# Patient Record
Sex: Male | Born: 1940
Health system: Southern US, Community
[De-identification: ages and names within clinical notes are randomized; demographics above are authoritative.]

## PROBLEM LIST (undated history)

## (undated) DIAGNOSIS — N400 Enlarged prostate without lower urinary tract symptoms: Secondary | ICD-10-CM

## (undated) DIAGNOSIS — I340 Nonrheumatic mitral (valve) insufficiency: Secondary | ICD-10-CM

## (undated) DIAGNOSIS — Z8719 Personal history of other diseases of the digestive system: Secondary | ICD-10-CM

## (undated) DIAGNOSIS — I482 Chronic atrial fibrillation, unspecified: Secondary | ICD-10-CM

## (undated) DIAGNOSIS — E7800 Pure hypercholesterolemia, unspecified: Secondary | ICD-10-CM

## (undated) DIAGNOSIS — C439 Malignant melanoma of skin, unspecified: Secondary | ICD-10-CM

## (undated) DIAGNOSIS — E876 Hypokalemia: Secondary | ICD-10-CM

## (undated) DIAGNOSIS — E875 Hyperkalemia: Secondary | ICD-10-CM

## (undated) DIAGNOSIS — E785 Hyperlipidemia, unspecified: Secondary | ICD-10-CM

## (undated) DIAGNOSIS — F329 Major depressive disorder, single episode, unspecified: Secondary | ICD-10-CM

## (undated) DIAGNOSIS — R011 Cardiac murmur, unspecified: Secondary | ICD-10-CM

## (undated) DIAGNOSIS — F32A Depression, unspecified: Secondary | ICD-10-CM

## (undated) DIAGNOSIS — I2510 Atherosclerotic heart disease of native coronary artery without angina pectoris: Secondary | ICD-10-CM

## (undated) DIAGNOSIS — I10 Essential (primary) hypertension: Secondary | ICD-10-CM

## (undated) DIAGNOSIS — I5032 Chronic diastolic (congestive) heart failure: Secondary | ICD-10-CM

## (undated) DIAGNOSIS — F5104 Psychophysiologic insomnia: Secondary | ICD-10-CM

## (undated) DIAGNOSIS — G4730 Sleep apnea, unspecified: Secondary | ICD-10-CM

## (undated) DIAGNOSIS — K5732 Diverticulitis of large intestine without perforation or abscess without bleeding: Secondary | ICD-10-CM

## (undated) DIAGNOSIS — E118 Type 2 diabetes mellitus with unspecified complications: Secondary | ICD-10-CM

## (undated) DIAGNOSIS — I499 Cardiac arrhythmia, unspecified: Secondary | ICD-10-CM

## (undated) DIAGNOSIS — N179 Acute kidney failure, unspecified: Secondary | ICD-10-CM

## (undated) HISTORY — DX: Psychophysiologic insomnia: F51.04

## (undated) HISTORY — DX: Depression, unspecified: F32.A

## (undated) HISTORY — DX: Hyperkalemia: E87.5

## (undated) HISTORY — DX: Hyperlipidemia, unspecified: E78.5

## (undated) HISTORY — PX: MOHS SURGERY: SUR867

## (undated) HISTORY — PX: OTHER SURGICAL HISTORY: SHX169

## (undated) HISTORY — PX: APPENDECTOMY: SHX54

## (undated) HISTORY — DX: Personal history of other diseases of the digestive system: Z87.19

## (undated) HISTORY — DX: Acute kidney failure, unspecified: N17.9

## (undated) HISTORY — DX: Benign prostatic hyperplasia without lower urinary tract symptoms: N40.0

## (undated) HISTORY — PX: COLONOSCOPY: SHX174

## (undated) HISTORY — DX: Major depressive disorder, single episode, unspecified: F32.9

## (undated) HISTORY — DX: Malignant melanoma of skin, unspecified: C43.9

## (undated) HISTORY — DX: Essential (primary) hypertension: I10

---

## 2004-07-05 ENCOUNTER — Ambulatory Visit: Payer: Self-pay | Admitting: Unknown Physician Specialty

## 2007-10-26 ENCOUNTER — Encounter: Payer: Self-pay | Admitting: Internal Medicine

## 2009-10-17 ENCOUNTER — Ambulatory Visit: Payer: Self-pay | Admitting: Unknown Physician Specialty

## 2013-10-02 ENCOUNTER — Inpatient Hospital Stay: Payer: Self-pay | Admitting: Internal Medicine

## 2013-10-02 LAB — BASIC METABOLIC PANEL
Anion Gap: 7 (ref 7–16)
BUN: 15 mg/dL (ref 7–18)
Calcium, Total: 9.1 mg/dL (ref 8.5–10.1)
Chloride: 104 mmol/L (ref 98–107)
Co2: 28 mmol/L (ref 21–32)
Creatinine: 1.02 mg/dL (ref 0.60–1.30)
EGFR (African American): >60
EGFR (Non-African Amer.): >60
Glucose: 147 mg/dL — ABNORMAL HIGH (ref 65–99)
Osmolality: 281 (ref 275–301)
Potassium: 3.9 mmol/L (ref 3.5–5.1)
Sodium: 139 mmol/L (ref 136–145)

## 2013-10-02 LAB — PRO B NATRIURETIC PEPTIDE: B-Type Natriuretic Peptide: 1468 pg/mL — ABNORMAL HIGH (ref 0–125)

## 2013-10-02 LAB — PROTIME-INR
INR: 3.2
Prothrombin Time: 31.9 secs — ABNORMAL HIGH (ref 11.5–14.7)

## 2013-10-02 LAB — CBC
HCT: 44.2 % (ref 40.0–52.0)
HGB: 14.4 g/dL (ref 13.0–18.0)
MCH: 30.1 pg (ref 26.0–34.0)
MCHC: 32.6 g/dL (ref 32.0–36.0)
MCV: 93 fL (ref 80–100)
Platelet: 180 10*3/uL (ref 150–440)
RBC: 4.78 10*6/uL (ref 4.40–5.90)
RDW: 13.8 % (ref 11.5–14.5)
WBC: 8 10*3/uL (ref 3.8–10.6)

## 2013-10-02 LAB — TROPONIN I
Troponin-I: <0.02 ng/mL
Troponin-I: <0.02 ng/mL

## 2013-10-03 LAB — CBC WITH DIFFERENTIAL/PLATELET
Basophil #: 0 10*3/uL (ref 0.0–0.1)
Basophil %: 0.5 %
Eosinophil #: 0.1 10*3/uL (ref 0.0–0.7)
Eosinophil %: 1.7 %
HCT: 42.4 % (ref 40.0–52.0)
HGB: 14.1 g/dL (ref 13.0–18.0)
Lymphocyte #: 1.5 10*3/uL (ref 1.0–3.6)
Lymphocyte %: 22 %
MCH: 30.5 pg (ref 26.0–34.0)
MCHC: 33.2 g/dL (ref 32.0–36.0)
MCV: 92 fL (ref 80–100)
Monocyte #: 0.7 x10 3/mm (ref 0.2–1.0)
Monocyte %: 10.4 %
Neutrophil #: 4.5 10*3/uL (ref 1.4–6.5)
Neutrophil %: 65.4 %
Platelet: 168 10*3/uL (ref 150–440)
RBC: 4.62 10*6/uL (ref 4.40–5.90)
RDW: 13.9 % (ref 11.5–14.5)
WBC: 6.9 10*3/uL (ref 3.8–10.6)

## 2013-10-03 LAB — COMPREHENSIVE METABOLIC PANEL
Albumin: 3.2 g/dL — ABNORMAL LOW (ref 3.4–5.0)
Alkaline Phosphatase: 63 U/L
Anion Gap: 9 (ref 7–16)
BUN: 13 mg/dL (ref 7–18)
Bilirubin,Total: 1.4 mg/dL — ABNORMAL HIGH (ref 0.2–1.0)
Calcium, Total: 8.5 mg/dL (ref 8.5–10.1)
Chloride: 101 mmol/L (ref 98–107)
Co2: 30 mmol/L (ref 21–32)
Creatinine: 1.02 mg/dL (ref 0.60–1.30)
EGFR (African American): >60
EGFR (Non-African Amer.): >60
Glucose: 101 mg/dL — ABNORMAL HIGH (ref 65–99)
Osmolality: 280 (ref 275–301)
Potassium: 3.3 mmol/L — ABNORMAL LOW (ref 3.5–5.1)
SGOT(AST): 44 U/L — ABNORMAL HIGH (ref 15–37)
SGPT (ALT): 62 U/L
Sodium: 140 mmol/L (ref 136–145)
Total Protein: 7 g/dL (ref 6.4–8.2)

## 2013-10-03 LAB — PROTIME-INR
INR: 2.6
Prothrombin Time: 26.9 secs — ABNORMAL HIGH (ref 11.5–14.7)

## 2013-10-03 LAB — TROPONIN I
Troponin-I: <0.02 ng/mL
Troponin-I: <0.02 ng/mL

## 2013-10-03 LAB — LIPID PANEL
Cholesterol: 169 mg/dL (ref 0–200)
HDL Cholesterol: 28 mg/dL — ABNORMAL LOW (ref 40–60)
Ldl Cholesterol, Calc: 104 mg/dL — ABNORMAL HIGH (ref 0–100)
Triglycerides: 187 mg/dL (ref 0–200)
VLDL Cholesterol, Calc: 37 mg/dL (ref 5–40)

## 2013-10-03 LAB — HEMOGLOBIN A1C: Hemoglobin A1C: 7 % — ABNORMAL HIGH (ref 4.2–6.3)

## 2013-10-03 LAB — TSH: Thyroid Stimulating Horm: 1.32 u[IU]/mL

## 2013-10-04 LAB — BASIC METABOLIC PANEL
Anion Gap: 6 — ABNORMAL LOW (ref 7–16)
BUN: 19 mg/dL — ABNORMAL HIGH (ref 7–18)
Calcium, Total: 9.2 mg/dL (ref 8.5–10.1)
Chloride: 100 mmol/L (ref 98–107)
Co2: 30 mmol/L (ref 21–32)
Creatinine: 1.2 mg/dL (ref 0.60–1.30)
EGFR (African American): >60
EGFR (Non-African Amer.): >60
Glucose: 147 mg/dL — ABNORMAL HIGH (ref 65–99)
Osmolality: 277 (ref 275–301)
Potassium: 3.5 mmol/L (ref 3.5–5.1)
Sodium: 136 mmol/L (ref 136–145)

## 2013-10-04 LAB — PROTIME-INR
INR: 2
Prothrombin Time: 22.5 secs — ABNORMAL HIGH (ref 11.5–14.7)

## 2013-10-05 LAB — BASIC METABOLIC PANEL
Anion Gap: 10 (ref 7–16)
BUN: 25 mg/dL — ABNORMAL HIGH (ref 7–18)
Calcium, Total: 8.9 mg/dL (ref 8.5–10.1)
Chloride: 99 mmol/L (ref 98–107)
Co2: 29 mmol/L (ref 21–32)
Creatinine: 1.41 mg/dL — ABNORMAL HIGH (ref 0.60–1.30)
EGFR (African American): 57 — ABNORMAL LOW
EGFR (Non-African Amer.): 49 — ABNORMAL LOW
Glucose: 140 mg/dL — ABNORMAL HIGH (ref 65–99)
Osmolality: 282 (ref 275–301)
Potassium: 3.4 mmol/L — ABNORMAL LOW (ref 3.5–5.1)
Sodium: 138 mmol/L (ref 136–145)

## 2013-10-05 LAB — PROTIME-INR
INR: 2
INR: 2
Prothrombin Time: 22.5 secs — ABNORMAL HIGH (ref 11.5–14.7)
Prothrombin Time: 21.9 secs — ABNORMAL HIGH (ref 11.5–14.7)

## 2013-10-06 DIAGNOSIS — I2510 Atherosclerotic heart disease of native coronary artery without angina pectoris: Secondary | ICD-10-CM

## 2013-10-06 HISTORY — DX: Atherosclerotic heart disease of native coronary artery without angina pectoris: I25.10

## 2013-10-06 HISTORY — PX: CARDIAC CATHETERIZATION: SHX172

## 2013-10-06 LAB — BASIC METABOLIC PANEL WITH GFR
Anion Gap: 6 — ABNORMAL LOW
BUN: 30 mg/dL — ABNORMAL HIGH
Calcium, Total: 8.6 mg/dL
Chloride: 99 mmol/L
Co2: 29 mmol/L
Creatinine: 1.56 mg/dL — ABNORMAL HIGH
EGFR (African American): 51 — ABNORMAL LOW
EGFR (Non-African Amer.): 44 — ABNORMAL LOW
Glucose: 161 mg/dL — ABNORMAL HIGH
Osmolality: 278
Potassium: 3.2 mmol/L — ABNORMAL LOW
Sodium: 134 mmol/L — ABNORMAL LOW

## 2013-10-06 LAB — PROTIME-INR
INR: 1.7
Prothrombin Time: 20 secs — ABNORMAL HIGH (ref 11.5–14.7)

## 2014-01-02 ENCOUNTER — Ambulatory Visit (INDEPENDENT_AMBULATORY_CARE_PROVIDER_SITE_OTHER): Payer: Medicare Other | Admitting: Cardiovascular Disease

## 2014-01-02 ENCOUNTER — Telehealth: Payer: Self-pay

## 2014-01-02 ENCOUNTER — Encounter: Payer: Self-pay | Admitting: Cardiovascular Disease

## 2014-01-02 VITALS — BP 160/90 | HR 72 | Ht 68.5 in | Wt 230.8 lb

## 2014-01-02 DIAGNOSIS — I4891 Unspecified atrial fibrillation: Secondary | ICD-10-CM

## 2014-01-02 DIAGNOSIS — I5033 Acute on chronic diastolic (congestive) heart failure: Secondary | ICD-10-CM | POA: Insufficient documentation

## 2014-01-02 DIAGNOSIS — R0609 Other forms of dyspnea: Secondary | ICD-10-CM | POA: Insufficient documentation

## 2014-01-02 DIAGNOSIS — R600 Localized edema: Secondary | ICD-10-CM

## 2014-01-02 DIAGNOSIS — I482 Chronic atrial fibrillation, unspecified: Secondary | ICD-10-CM | POA: Insufficient documentation

## 2014-01-02 DIAGNOSIS — Z87891 Personal history of nicotine dependence: Secondary | ICD-10-CM | POA: Insufficient documentation

## 2014-01-02 DIAGNOSIS — R0602 Shortness of breath: Secondary | ICD-10-CM | POA: Insufficient documentation

## 2014-01-02 DIAGNOSIS — E118 Type 2 diabetes mellitus with unspecified complications: Secondary | ICD-10-CM | POA: Insufficient documentation

## 2014-01-02 DIAGNOSIS — E785 Hyperlipidemia, unspecified: Secondary | ICD-10-CM | POA: Insufficient documentation

## 2014-01-02 DIAGNOSIS — I2510 Atherosclerotic heart disease of native coronary artery without angina pectoris: Secondary | ICD-10-CM | POA: Insufficient documentation

## 2014-01-02 MED ORDER — FUROSEMIDE 20 MG PO TABS: 20.0000 mg | ORAL_TABLET | Freq: Two times a day (BID) | ORAL | Status: DC | PRN

## 2014-01-02 MED ORDER — POTASSIUM CHLORIDE ER 10 MEQ PO TBCR: 10.0000 meq | EXTENDED_RELEASE_TABLET | Freq: Two times a day (BID) | ORAL | Status: DC | PRN

## 2014-01-02 MED ORDER — RIVAROXABAN 20 MG PO TABS: 20.0000 mg | ORAL_TABLET | Freq: Every day | ORAL | Status: DC

## 2014-01-02 MED ORDER — SIMVASTATIN 40 MG PO TABS: 40.0000 mg | ORAL_TABLET | Freq: Every day | ORAL | Status: DC

## 2014-01-02 NOTE — Patient Instructions (Addendum)
   Please check the price of the xarelto And pradaxa Do not start xarelto until you have called the office   We will schedule a follow up visit with the coumadin clinic   Please start lasix up to two a day  Daily weight: Weight 218 to 220:  Lasix 20 mg daily Weight 221 to 226: lasix 20 mg twice a day Weight 227 to 231: lasix  40 mg in the AM and 20 mg in the PM   Restart simvastatin one a day  For any chest pain or shortness of breath, call the office  Please call us if you have new issues that need to be addressed before your next appt.  Your physician wants you to follow-up in: 1 month.

## 2014-01-02 NOTE — Assessment & Plan Note (Signed)
Heart rate well controlled, tolerating warfarin. INR fluctuating between 2 and 4. He is interested in changing to an alternate medication, NOAC. Prescription provided for pradaxa and xarelto he will check the price of these medications and call us if they are affordable. He would like to establish in the Coumadin clinic in our office. We will arrange this for him

## 2014-01-02 NOTE — Assessment & Plan Note (Signed)
We have provided him a prescription for simvastatin 40 mg daily. Goal LDL less than 70

## 2014-01-02 NOTE — Assessment & Plan Note (Addendum)
His cardiac catheterization films were pulled up and reviewed with him in the office. Coronary stenoses shown to the patient and results reviewed in detail. Recommended he restart his simvastatin 40 mg daily as he was taking previously. Marlowe Kays not on aspirin as he is on warfarin. He is on a beta blocker. Stressed the importance of calling our office if he gets worsening chest pain or shortness of breath

## 2014-01-02 NOTE — Telephone Encounter (Signed)
Spoke w/ Marlowe Kays.  Advised her that Dr. Rockey Situ advised pt to check on the price of Xarelto and Pradaxa and call the office w/ decision on which he would like to switch to.

## 2014-01-02 NOTE — Assessment & Plan Note (Addendum)
Weight is up 10 pounds from his recent hospital discharge August 2015. Dietary discretion, not on Lasix. We have recommended he follow the guidelines below: Check Daily weight: Weight 218 to 220:  Lasix 20 mg daily Weight 221 to 226: lasix 20 mg twice a day Weight 227 to 231: lasix  40 mg in the AM and 20 mg in the PM  He will take 1 potassium 10 mEq 4 every Lasix 20 mg

## 2014-01-02 NOTE — Assessment & Plan Note (Signed)
Long history of smoking may be contributing to his underlying lung disease and shortness of breath. Suspect at least mild COPD.

## 2014-01-02 NOTE — Assessment & Plan Note (Signed)
Shortness of breath likely multifactorial including long history of smoking, COPD, diastolic CHF, coronary artery disease, and atrial fibrillation as well as deconditioning and obesity. Diuretic regimen as above

## 2014-01-02 NOTE — Progress Notes (Addendum)
Patient ID: LABRYANT CHALIFOUX, male    DOB: 1940/03/21, 73 y.o.   MRN: [id number]  HPI Comments:  Mr. Wojtas is a very pleasant 73 year old gentleman with long history of smoking for 30-40 years, some alcohol use, obesity, chronic diastolic CHF, chronic atrial fibrillation, hypertension, type 2 diabetes, presenting for new patient evaluation.  He had recent hospital admission 10/02/2013 with discharge August 12. Hospital records were reviewed He presented with shortness of breath, Atrial fibrillation with RVR. Echocardiogram showed normal ejection fraction. Symptoms improved with diuresis.  Echocardiogram 10/02/2013 showing ejection fraction 55-60%, mildly dilated left atrium, mild to moderate MR and TR  He had cardiac catheterization 10/06/2013 showing occluded diagonal vessel, 40% proximal LAD disease, bifurcating marginal branch/ramus. Ramus had 60% followed by 80% disease, OM with 50% mid disease at least. RCA with 40% mid disease. Medical management recommended  He was discharged on Lasix, resents today with no Lasix on his medication list. He recently went to the beach and weight is up 10 pounds from his recent discharge from the hospital in August. He has abdominal swelling, leg swelling. Most recent hemoglobin A1c 7.1 up from 6.9 Previously was on simvastatin. Currently not on simvastatin for uncertain reasons. On the simvastatin, total cholesterol was 139, LDL 50 He is interested in coming off warfarin and try an alternate medication. xarelto was too expensive in the past. He is requesting that his warfarin be monitored in our clinic.  Prior weight at the time of hospital discharge was 218 pounds, now he reports 228 pounds at home, 230 pounds in our clinic  EKG done in our office today shows atrial fibrillation with right bundle branch block, rate 72 bpm   Outpatient Encounter Prescriptions as of 01/02/2014  Medication Sig  . aspirin 81 MG tablet Take 81 mg by mouth daily.  .  carvedilol (COREG) 25 MG tablet Take 25 mg by mouth 2 (two) times daily with a meal.   . diltiazem (DILACOR XR) 120 MG 24 hr capsule Take 120 mg by mouth daily.   [name] glimepiride (AMARYL) 4 MG tablet Take 4 mg by mouth daily with breakfast.  . hydrALAZINE (APRESOLINE) 25 MG tablet Take 25 mg by mouth 3 (three) times daily.   [name] lisinopril-hydrochlorothiazide (PRINZIDE,ZESTORETIC) 20-12.5 MG per tablet Take 1 tablet by mouth daily.   . magnesium oxide (MAG-OX) 400 MG tablet Take 400 mg by mouth daily.  . metformin (FORTAMET) 1000 MG (OSM) 24 hr tablet Take 1,000 mg by mouth 2 (two) times daily with a meal.   . nitroGLYCERIN (NITROSTAT) 0.4 MG SL tablet Place 0.4 mg under the tongue every 5 (five) minutes as needed for chest pain.  [name] OMEGA-3 FATTY ACIDS PO Take 1 tablet by mouth twice a day.  . tamsulosin (FLOMAX) 0.4 MG CAPS capsule Take 0.4 mg by mouth daily.   [name] warfarin (COUMADIN) 1 MG tablet Take 1 mg by mouth one time only at 6 PM.   . warfarin (COUMADIN) 4 MG tablet Take 4 mg by mouth one time only at 6 PM.   . zolpidem (AMBIEN) 10 MG tablet Take 5 mg by mouth at bedtime as needed for sleep.  [name] QUEtiapine (SEROQUEL) 100 MG tablet Take 100 mg by mouth at bedtime.   In terms of his social history He drinks alcohol periodically, sometimes in volume. Stop smoking 20 years ago, smoked for 30-40 years. No chewing tobacco  In terms of his family history Positive for coronary artery disease, parents  Past Medical History  Diagnosis Date  . CHF (congestive heart failure)   . A-fib   . Acute renal failure   . Systolic CHF   . Hyperkalemia   . Hyperlipidemia   . Diabetes mellitus without complication   . Coronary artery disease 10/06/2013    [facility]  . Hypertension   . BPH (benign prostatic hyperplasia)   . History of diverticulitis   . Systolic murmur   . Depression   . Malignant melanoma   . Chronic insomnia     Review of Systems  Constitutional: Positive for unexpected weight change.   Respiratory: Positive for shortness of breath.   Cardiovascular: Positive for leg swelling.  Gastrointestinal: Negative.   Neurological: Negative.   Psychiatric/Behavioral: Negative.   All other systems reviewed and are negative.   BP 160/90 mmHg  Pulse 72  Ht 5' 8.5" (1.74 m)  Wt 230 lb 12 oz (104.668 kg)  BMI 34.57 kg/m2  Physical Exam  Constitutional: He is oriented to person, place, and time. He appears well-developed and well-nourished.  obese  HENT:  Head: Normocephalic.  Nose: Nose normal.  Mouth/Throat: Oropharynx is clear and moist.  Eyes: Conjunctivae are normal. Pupils are equal, round, and reactive to light.  Neck: Normal range of motion. Neck supple. No JVD present.  Cardiovascular: Normal rate, S1 normal, S2 normal, normal heart sounds and intact distal pulses.  An irregularly irregular rhythm present. Exam reveals no gallop and no friction rub.   No murmur heard. 1 plus pitting edema   Pulmonary/Chest: Effort normal and breath sounds normal. No respiratory distress. He has no wheezes. He has no rales. He exhibits no tenderness.  Unable to exclude dose at the left base  Abdominal: Soft. Bowel sounds are normal. He exhibits distension. There is no tenderness.  Musculoskeletal: Normal range of motion. He exhibits edema. He exhibits no tenderness.  Lymphadenopathy:    He has no cervical adenopathy.  Neurological: He is alert and oriented to person, place, and time. Coordination normal.  Skin: Skin is warm and dry. No rash noted. No erythema.  Psychiatric: He has a normal mood and affect. His behavior is normal. Judgment and thought content normal.      Assessment and Plan   Nursing note and vitals reviewed.

## 2014-01-02 NOTE — Telephone Encounter (Signed)
Pharmacist from [facility], states they have a rx for Warfarin on file, and we prescribe Xarelto. Please call.

## 2014-01-02 NOTE — Assessment & Plan Note (Signed)
We have encouraged continued exercise, careful diet management in an effort to lose weight. 

## 2014-01-02 NOTE — Assessment & Plan Note (Signed)
Etiology of his leg edema concerning for worsening/acute on chronic diastolic CHF. We will start Lasix titration as above. Dietary guide given to him. Recommended low salt diet, decreasing his fluid intake

## 2014-01-03 ENCOUNTER — Telehealth: Payer: Self-pay

## 2014-01-03 NOTE — Telephone Encounter (Signed)
Pt wife called letting us know that they got Pradaxa filled, pt needs instructions on stopping warfarin. Please call and advise

## 2014-01-03 NOTE — Telephone Encounter (Signed)
Would stop the warfarin for 3 days Then start pradaxa one pill twice a day

## 2014-01-04 NOTE — Telephone Encounter (Signed)
Spoke w/ pt's wife.  Advised her of Dr. Donivan Scull recommendation.  She verbalizes understanding and is agreeable.  She reports that pt's wt is down to 216 lbs and he is feeling much better.  Asked her to call back w/ any questions or concerns.

## 2014-01-05 ENCOUNTER — Telehealth: Payer: Self-pay | Admitting: Cardiovascular Disease

## 2014-01-05 MED ORDER — NITROGLYCERIN 0.4 MG SL SUBL: 0.4000 mg | SUBLINGUAL_TABLET | SUBLINGUAL | Status: DC | PRN

## 2014-01-05 NOTE — Telephone Encounter (Signed)
Spoke w/ pt's wife.  She reports that pt needs refill on Nitro and that pt has not taken Seroquel since d/c from hospital.  Refill sent and med list updated.

## 2014-01-05 NOTE — Telephone Encounter (Signed)
New problem   Pt has question concerning medications that the pt doesn't have any orders for. Pt's wife will be at home for next hour and after that please call her cell at [phone].

## 2014-02-01 ENCOUNTER — Encounter: Payer: Self-pay | Admitting: Cardiovascular Disease

## 2014-02-01 ENCOUNTER — Ambulatory Visit (INDEPENDENT_AMBULATORY_CARE_PROVIDER_SITE_OTHER): Payer: Medicare Other

## 2014-02-01 ENCOUNTER — Ambulatory Visit (INDEPENDENT_AMBULATORY_CARE_PROVIDER_SITE_OTHER): Payer: Medicare Other | Admitting: Cardiovascular Disease

## 2014-02-01 ENCOUNTER — Encounter (INDEPENDENT_AMBULATORY_CARE_PROVIDER_SITE_OTHER): Payer: Self-pay

## 2014-02-01 VITALS — BP 136/78 | HR 101 | Ht 69.0 in | Wt 228.3 lb

## 2014-02-01 DIAGNOSIS — I4891 Unspecified atrial fibrillation: Secondary | ICD-10-CM

## 2014-02-01 DIAGNOSIS — I5033 Acute on chronic diastolic (congestive) heart failure: Secondary | ICD-10-CM

## 2014-02-01 DIAGNOSIS — R0602 Shortness of breath: Secondary | ICD-10-CM

## 2014-02-01 DIAGNOSIS — I2510 Atherosclerotic heart disease of native coronary artery without angina pectoris: Secondary | ICD-10-CM

## 2014-02-01 DIAGNOSIS — Z5181 Encounter for therapeutic drug level monitoring: Secondary | ICD-10-CM

## 2014-02-01 DIAGNOSIS — E118 Type 2 diabetes mellitus with unspecified complications: Secondary | ICD-10-CM

## 2014-02-01 DIAGNOSIS — R600 Localized edema: Secondary | ICD-10-CM

## 2014-02-01 NOTE — Assessment & Plan Note (Signed)
Currently with no symptoms of angina. No further workup at this time. Continue current medication regimen. 

## 2014-02-01 NOTE — Assessment & Plan Note (Signed)
We have encouraged continued exercise, careful diet management in an effort to lose weight. 

## 2014-02-01 NOTE — Progress Notes (Signed)
Pt was started on Pradaxa 150mg  BID by Dr Rockey Situ for afib on 01/04/14.    Reviewed patients medication list.  Pt is not currently on any combined P-gp and strong CYP3A4 inhibitors/inducers (ketoconazole, traconazole, ritonavir, carbamazepine, phenytoin, rifampin, [place]. Wylan's wort).  Reviewed labs.  SCr 1.1, Weight 103.5kg, CrCl-88.43.  Dose appropriate based on CrCl.   Hgb and HCT slightly decreased from labwork in August 2015. Discussed with Elberta Leatherwood, pharmD will recheck CBC in 1 month for stability.  Hgb 14.1 on 10/02/13 at [facility], Hgb 12.5 at office on 02/01/14.   Called spoke with pt's wife advised of lab results and need to repeat CBC in 1 month for stability.  Made pt appt on 02/28/13 at [facility] for CBC in [place] office.

## 2014-02-01 NOTE — Assessment & Plan Note (Signed)
Recommended diet changes, weight loss

## 2014-02-01 NOTE — Patient Instructions (Signed)
A full discussion of the nature of anticoagulants has been carried out.  A benefit/risk analysis has been presented to the patient, so that they understand the justification for choosing anticoagulation with Praxada at this time.  The need for compliance is stressed.  Pt is aware to take the medication twice daily.  Side effects of potential bleeding are discussed, including unusual colored urine or stools, coughing up blood or coffee ground emesis, nose bleeds or serious fall or head trauma.  Discussed signs and symptoms of stroke. The patient should avoid any OTC items containing aspirin or ibuprofen.  Avoid alcohol consumption.   Call if any signs of abnormal bleeding.  Discussed financial obligations and resolved any difficulty in obtaining medication.  Asked pt to call MD if any medications change or he is scheduled for a procedure. Next lab test test in 6 months.

## 2014-02-01 NOTE — Patient Instructions (Signed)
You are doing well. No medication changes were made.  Check Daily weight: Weight less than 218, no lasix Weight 218 to 220: Lasix 20 mg daily Weight 221 to 226: lasix 20 mg twice a day Weight 227 to 231: lasix 40 mg in the AM and 20 mg in the PM  He will take 1 potassium 10 mEq 4 every Lasix 20 mg  Please call us if you have new issues that need to be addressed before your next appt.  Your physician wants you to follow-up in: 6 months.  You will receive a reminder letter in the mail two months in advance. If you don't receive a letter, please call our office to schedule the follow-up appointment.

## 2014-02-01 NOTE — Assessment & Plan Note (Signed)
Leg edema has significantly improved on his current diuretic regimen

## 2014-02-01 NOTE — Progress Notes (Signed)
Patient ID: Todd Valentine, male    DOB: Feb 15, 1941, 73 y.o.   MRN: [id number]  HPI Comments:  Todd Valentine is a very pleasant 73 year old gentleman with long history of smoking for 30-40 years, some alcohol use, obesity, chronic diastolic CHF, chronic atrial fibrillation, hypertension, type 2 diabetes, presenting for follow-up of his atrial fibrillation  In follow-up today, he reports that his weight is down to 218 pounds on Lasix 20 mg twice a day. Legs are less swollen, less shortness of breath, less abdominal swelling Reports his heart rate is typically well controlled. He uses a wrist monitor  He changed from warfarin to pradaxa and prefers this regimen better Overall he has no complaints.  EKG on today's visit shows atrial fibrillation with rate 101 bpm, PVCs noted  Other past medical history  hospital admission 10/02/2013 with discharge August 12. He presented with shortness of breath, Atrial fibrillation with RVR. Echocardiogram showed normal ejection fraction. Symptoms improved with diuresis.  Echocardiogram 10/02/2013 showing ejection fraction 55-60%, mildly dilated left atrium, mild to moderate MR and TR  He had cardiac catheterization 10/06/2013 showing occluded diagonal vessel, 40% proximal LAD disease, bifurcating marginal branch/ramus. Ramus had 60% followed by 80% disease, OM with 50% mid disease at least. RCA with 40% mid disease. Medical management recommended  Most recent hemoglobin A1c 7.1 up from 6.9 Previously was on simvastatin. Currently not on simvastatin for uncertain reasons. On the simvastatin, total cholesterol was 139, LDL 50  Prior weight at the time of hospital discharge was 218 pounds, now he reports 228 pounds at home, 230 pounds in our clinic   Outpatient Encounter Prescriptions as of 02/01/2014  Medication Sig  . aspirin 81 MG tablet Take 81 mg by mouth daily.  . carvedilol (COREG) 25 MG tablet Take 25 mg by mouth 2 (two) times daily with a meal.    . dabigatran (PRADAXA) 150 MG CAPS capsule Take 150 mg by mouth 2 (two) times daily.  [name] diltiazem (DILACOR XR) 120 MG 24 hr capsule Take 120 mg by mouth daily.   . furosemide (LASIX) 20 MG tablet Take 1 tablet (20 mg total) by mouth 2 (two) times daily as needed.  [name] glimepiride (AMARYL) 4 MG tablet Take 4 mg by mouth daily with breakfast.  . hydrALAZINE (APRESOLINE) 25 MG tablet Take 25 mg by mouth 3 (three) times daily.   [name] HYDROcodone-acetaminophen (NORCO/VICODIN) 5-325 MG per tablet Take 1 tablet by mouth every 6 (six) hours as needed.   [name] lisinopril-hydrochlorothiazide (PRINZIDE,ZESTORETIC) 20-12.5 MG per tablet Take 1 tablet by mouth daily.   . magnesium oxide (MAG-OX) 400 MG tablet Take 400 mg by mouth daily.  . metformin (FORTAMET) 1000 MG (OSM) 24 hr tablet Take 1,000 mg by mouth 2 (two) times daily with a meal.   . nitroGLYCERIN (NITROSTAT) 0.4 MG SL tablet Place 1 tablet (0.4 mg total) under the tongue every 5 (five) minutes as needed for chest pain.  [name] OMEGA-3 FATTY ACIDS PO Take 1 tablet by mouth twice a day.  . potassium chloride (K-DUR) 10 MEQ tablet Take 1 tablet (10 mEq total) by mouth 2 (two) times daily as needed.  . simvastatin (ZOCOR) 40 MG tablet Take 1 tablet (40 mg total) by mouth at bedtime.  . tamsulosin (FLOMAX) 0.4 MG CAPS capsule Take 0.4 mg by mouth daily.   [name] zolpidem (AMBIEN) 10 MG tablet Take 5 mg by mouth at bedtime as needed for sleep.    In terms of his social history  He drinks alcohol periodically, sometimes in volume. Stop smoking 20 years ago, smoked for 30-40 years. No chewing tobacco  In terms of his family history Positive for coronary artery disease, parents  Past Medical History  Diagnosis Date  . CHF (congestive heart failure)   . A-fib   . Acute renal failure   . Systolic CHF   . Hyperkalemia   . Hyperlipidemia   . Diabetes mellitus without complication   . Coronary artery disease 10/06/2013    [facility]  . Hypertension   . BPH (benign  prostatic hyperplasia)   . History of diverticulitis   . Systolic murmur   . Depression   . Malignant melanoma   . Chronic insomnia     Review of Systems  Constitutional: Negative.   Respiratory: Negative.   Cardiovascular: Negative.   Gastrointestinal: Negative.   Neurological: Negative.   Psychiatric/Behavioral: Negative.   All other systems reviewed and are negative.   BP 136/78 mmHg  Pulse 101  Ht 5\' 9"  (1.753 m)  Wt 228 lb 5 oz (103.562 kg)  BMI 33.70 kg/m2  Physical Exam  Constitutional: He is oriented to person, place, and time. He appears well-developed and well-nourished.  obese  HENT:  Head: Normocephalic.  Nose: Nose normal.  Mouth/Throat: Oropharynx is clear and moist.  Eyes: Conjunctivae are normal. Pupils are equal, round, and reactive to light.  Neck: Normal range of motion. Neck supple. No JVD present.  Cardiovascular: Normal rate, S1 normal, S2 normal, normal heart sounds and intact distal pulses.  An irregularly irregular rhythm present. Exam reveals no gallop and no friction rub.   No murmur heard. 1 plus pitting edema   Pulmonary/Chest: Effort normal and breath sounds normal. No respiratory distress. He has no wheezes. He has no rales. He exhibits no tenderness.  Unable to exclude dose at the left base  Abdominal: Soft. Bowel sounds are normal. He exhibits distension. There is no tenderness.  Musculoskeletal: Normal range of motion. He exhibits edema. He exhibits no tenderness.  Lymphadenopathy:    He has no cervical adenopathy.  Neurological: He is alert and oriented to person, place, and time. Coordination normal.  Skin: Skin is warm and dry. No rash noted. No erythema.  Psychiatric: He has a normal mood and affect. His behavior is normal. Judgment and thought content normal.      Assessment and Plan   Nursing note and vitals reviewed.

## 2014-02-01 NOTE — Assessment & Plan Note (Signed)
Heart rate relatively well controlled, he is on anticoagulation and tolerating this well. We did discuss options for his atrial fibrillation. He is inclined to stay on his current course. Recommended he call us if he has worsening symptoms of shortness of breath.

## 2014-02-01 NOTE — Assessment & Plan Note (Addendum)
Shortness of breath has improved with aggressive diuresis. He we'll follow the diuretic titration scale above. Lab work has been ordered today including BMP and CBC

## 2014-02-01 NOTE — Assessment & Plan Note (Addendum)
Weight significantly improved. We will continue him on his current Lasix titration scale as detailed on his previous office note. Recommended he Check Daily weight: Weight less than 218, no lasix Weight 218 to 220: Lasix 20 mg daily Weight 221 to 226: lasix 20 mg twice a day Weight 227 to 231: lasix 40 mg in the AM and 20 mg in the PM  He will take 1 potassium 10 mEq 4 every Lasix 20 mg

## 2014-02-02 LAB — BASIC METABOLIC PANEL
BUN/Creatinine Ratio: 19 (ref 10–22)
BUN: 21 mg/dL (ref 8–27)
CO2: 15 mmol/L — ABNORMAL LOW (ref 18–29)
Calcium: 9.2 mg/dL (ref 8.6–10.2)
Chloride: 103 mmol/L (ref 97–108)
Creatinine, Ser: 1.1 mg/dL (ref 0.76–1.27)
GFR calc Af Amer: 77 mL/min/{1.73_m2} (ref >59)
GFR calc non Af Amer: 67 mL/min/{1.73_m2} (ref >59)
Glucose: 171 mg/dL — ABNORMAL HIGH (ref 65–99)
Potassium: 4.4 mmol/L (ref 3.5–5.2)
Sodium: 139 mmol/L (ref 134–144)

## 2014-02-02 LAB — CBC WITH DIFFERENTIAL
Basophils Absolute: 0 10*3/uL (ref 0.0–0.2)
Basos: 1 %
Eos: 4 %
Eosinophils Absolute: 0.2 10*3/uL (ref 0.0–0.4)
HCT: 38.2 % (ref 37.5–51.0)
Hemoglobin: 12.5 g/dL — ABNORMAL LOW (ref 12.6–17.7)
Immature Grans (Abs): 0 10*3/uL (ref 0.0–0.1)
Immature Granulocytes: 0 %
Lymphocytes Absolute: 1.1 10*3/uL (ref 0.7–3.1)
Lymphs: 23 %
MCH: 29.8 pg (ref 26.6–33.0)
MCHC: 32.7 g/dL (ref 31.5–35.7)
MCV: 91 fL (ref 79–97)
Monocytes Absolute: 0.5 10*3/uL (ref 0.1–0.9)
Monocytes: 11 %
Neutrophils Absolute: 2.8 10*3/uL (ref 1.4–7.0)
Neutrophils Relative %: 61 %
Platelets: 162 10*3/uL (ref 150–379)
RBC: 4.2 x10E6/uL (ref 4.14–5.80)
RDW: 14.6 % (ref 12.3–15.4)
WBC: 4.5 10*3/uL (ref 3.4–10.8)

## 2014-02-07 ENCOUNTER — Encounter: Payer: Self-pay | Admitting: Cardiovascular Disease

## 2014-02-09 ENCOUNTER — Telehealth: Payer: Self-pay

## 2014-02-09 NOTE — Telephone Encounter (Signed)
Pt wife called, states she needs someone to go over pt medication list with her. She specifically has a question regarding his Metformin.

## 2014-02-09 NOTE — Telephone Encounter (Signed)
Spoke w/ pt's wife.  She states that she misplaced AVS from last visit. Reviewed instructions w/ her and advised her that metformin dose was not changed.  Mailed copy of AVS to pt.

## 2014-02-27 ENCOUNTER — Other Ambulatory Visit: Payer: Self-pay

## 2014-02-27 ENCOUNTER — Telehealth: Payer: Self-pay | Admitting: *Deleted

## 2014-02-27 DIAGNOSIS — E785 Hyperlipidemia, unspecified: Secondary | ICD-10-CM

## 2014-02-27 NOTE — Telephone Encounter (Signed)
Patient's wife stated Dr. Rockey Situ wanted her to call the office the day before lab appt. If the patient wanted to have fasting labs drawn at the same time.

## 2014-02-28 ENCOUNTER — Other Ambulatory Visit (INDEPENDENT_AMBULATORY_CARE_PROVIDER_SITE_OTHER): Payer: Medicare Other

## 2014-02-28 DIAGNOSIS — Z5181 Encounter for therapeutic drug level monitoring: Secondary | ICD-10-CM

## 2014-02-28 DIAGNOSIS — E785 Hyperlipidemia, unspecified: Secondary | ICD-10-CM

## 2014-02-28 DIAGNOSIS — I4891 Unspecified atrial fibrillation: Secondary | ICD-10-CM

## 2014-02-28 DIAGNOSIS — E119 Type 2 diabetes mellitus without complications: Secondary | ICD-10-CM

## 2014-03-01 LAB — LIPID PANEL
Chol/HDL Ratio: 2.7 ratio units (ref 0.0–5.0)
Cholesterol, Total: 119 mg/dL (ref 100–199)
HDL: 44 mg/dL (ref >39)
LDL Calculated: 55 mg/dL (ref 0–99)
Triglycerides: 99 mg/dL (ref 0–149)
VLDL Cholesterol Cal: 20 mg/dL (ref 5–40)

## 2014-03-01 LAB — HEMOGLOBIN A1C
Est. average glucose Bld gHb Est-mCnc: 157 mg/dL
Hgb A1c MFr Bld: 7.1 % — ABNORMAL HIGH (ref 4.8–5.6)

## 2014-03-01 LAB — CBC
HCT: 39.5 % (ref 37.5–51.0)
Hemoglobin: 13.5 g/dL (ref 12.6–17.7)
MCH: 30.1 pg (ref 26.6–33.0)
MCHC: 34.2 g/dL (ref 31.5–35.7)
MCV: 88 fL (ref 79–97)
Platelets: 141 10*3/uL — ABNORMAL LOW (ref 150–379)
RBC: 4.49 x10E6/uL (ref 4.14–5.80)
RDW: 14.4 % (ref 12.3–15.4)
WBC: 5.3 10*3/uL (ref 3.4–10.8)

## 2014-03-07 ENCOUNTER — Telehealth: Payer: Self-pay | Admitting: Cardiovascular Disease

## 2014-03-07 ENCOUNTER — Other Ambulatory Visit: Payer: Self-pay | Admitting: *Deleted

## 2014-03-07 MED ORDER — DILTIAZEM HCL ER 120 MG PO CP24: 120.0000 mg | ORAL_CAPSULE | Freq: Every day | ORAL | Status: DC

## 2014-03-07 NOTE — Telephone Encounter (Signed)
Rx sent for Diltiazem to [facility]90 R#3

## 2014-03-07 NOTE — Telephone Encounter (Signed)
°  1. Which medications need to be refilled? diltiaz er 120-124 cap   2. Which pharmacy is medication to be sent to? [facility] in [place]   3. Do they need a 30 day or 90 day supply? 30 day, but if they can 90 day  4. Would they like a call back once the medication has been sent to the pharmacy? yes

## 2014-03-09 ENCOUNTER — Telehealth: Payer: Self-pay | Admitting: Cardiovascular Disease

## 2014-03-09 NOTE — Telephone Encounter (Signed)
Spoke w/ pt's wife.  She asks that I fax pt's recent labs to PCP. Faxed to Dr. Devona Konig attn @ [facility] in [place] at [phone]. Asked her to call back if we can be of further assistance.

## 2014-03-09 NOTE — Telephone Encounter (Signed)
Pt wife calling stating pt did blood work on the 9th, and he went to PCP and they would like [place] to fax over blood work to them so they do not need to re draw blood    [facility] Dr. Kym Groom needs it.

## 2014-03-28 ENCOUNTER — Telehealth: Payer: Self-pay | Admitting: Cardiovascular Disease

## 2014-03-28 MED ORDER — DILTIAZEM HCL ER 120 MG PO CP24: 120.0000 mg | ORAL_CAPSULE | Freq: Every day | ORAL | Status: DC

## 2014-03-28 NOTE — Telephone Encounter (Signed)
Pt wife called asking about refill.   1. Which medications need to be refilled? diltiazer   2. Which pharmacy is medication to be sent to? [facility] in [place]  3. Do they need a 30 day or 90 day supply? 90 day please  4. Would they like a call back once the medication has been sent to the pharmacy? Yes.

## 2014-03-28 NOTE — Telephone Encounter (Signed)
Refill sent for diltiazem

## 2014-04-24 ENCOUNTER — Other Ambulatory Visit: Payer: Self-pay

## 2014-04-24 MED ORDER — LISINOPRIL-HYDROCHLOROTHIAZIDE 20-12.5 MG PO TABS: 1.0000 | ORAL_TABLET | Freq: Every day | ORAL | Status: DC

## 2014-04-24 NOTE — Telephone Encounter (Signed)
Pt wife states pt  This encounter was created in error - please disregard.

## 2014-05-04 ENCOUNTER — Other Ambulatory Visit: Payer: Self-pay

## 2014-05-04 MED ORDER — CARVEDILOL 25 MG PO TABS: 25.0000 mg | ORAL_TABLET | Freq: Two times a day (BID) | ORAL | Status: DC

## 2014-06-17 NOTE — H&P (Signed)
PATIENT NAME:  Todd Valentine, Todd Valentine MR#:  [id number] DATE OF BIRTH:  1940/09/14  DATE OF ADMISSION:  10/02/2013  PRIMARY CARE PHYSICIAN: Dr. Johny Drilling  HISTORY OF PRESENT ILLNESS: This is a 74 year old Caucasian male with a past medical history of hypertension, atrial fibrillation, and diabetes, who presents with progressive dyspnea on exertion and orthopnea. He states that this has been going on for the last couple of months and getting progressively worse. Initially, he began noticing dyspnea with activities such as golfing. This progressed to include dyspnea on exertion just walking short distances such as from his deck to a building behind his house. He also began to have progressive orthopnea. He would lie down flat. He felt that he could not breathe. He began sitting up in his chair to sleep at night. At that point, he decided he needed to come in to the Emergency Department. In the ED, he was found to be in hypertensive emergency with blood pressures in the 210s over 120s. The initial report, even after IV labetalol, his blood pressure only came down to the 190s over 110s. He is on a number of antihypertensives and reports compliance with these medications. For his atrial fibrillation, he is seen by his cardiologist, Dr. Nehemiah Massed. Of note, he is also on Coumadin due to his atrial fibrillation for stroke prophylaxis. His INR in the ED was slightly supratherapeutic at 3.2. At this point, the hospitalist service was consulted for evaluation for admission.  PAST MEDICAL HISTORY: Type 2 diabetes, hypertension, and atrial fibrillation.  HOME MEDICATIONS: Ambien 10 mg 1 tablet daily, sertraline 15 mg 1 tablet daily, propafenone 225 mg 3 times daily, hydrochlorothiazide/lisinopril combination 12.5 mg/20 mg 1 tablet once a day, Glimepiride 4 mg 1 tablet daily, Lasix 20 mg 1 tablet daily, diltiazem 120 mg once daily, clonidine 0.1 mg orally two times a day, carvedilol 25 mg 1 tablet twice daily.  ALLERGIES:  CIPRO.  SOCIAL HISTORY: He is a former smoker. He lives at home with his wife.   REVIEW OF SYSTEMS: CONSTITUTIONAL: Denies fever. Denies chills. Denies malaise. Endorses fatigue. EYES: Denies double vision. Denies blurred vision. EARS: Denies tinnitus. Denies ear pain. THROAT: Denies sore throat. Denies postnasal drip. CARDIOVASCULAR: Denies palpitations. Denies chest pain. Endorses dyspnea on exertion. Endorses orthopnea. Endorses lower extremity edema. RESPIRATORY: Endorses dyspnea on exertion as above. GASTROINTESTINAL: Denies nausea. Denies vomiting. Denies diarrhea. Denies abdominal pain. GENITOURINARY: Denies dysuria. Denies frequency. Denies hematuria. ENDOCRINE: Denies polyuria. Denies heat or cold intolerance. SKIN: Denies rash. NEUROLOGIC: Denies focal weakness. Denies numbness.  PHYSICAL EXAMINATION: VITAL SIGNS: Temperature 98.2, pulse 102, respirations 23, blood pressure 191/105, pulse ox 95% on room air. GENERAL: The patient is sitting, supine, in bed, in no apparent distress. HEENT: Normocephalic, atraumatic. Pupils are equal, round, and reactive to light. Extraocular movements intact. Sclerae are anicteric. NECK: Supple. No cervical lymphadenopathy. CARDIOVASCULAR: Tachycardic. Irregular rhythm. S1 and S2 present. No murmurs, rubs, or gallops. Has 2+ peripheral edema. RESPIRATORY: Bibasilar crackles. Good air movement. Good inspiratory effort. No use of accessory muscles.  GASTROINTESTINAL: Abdomen soft, nontender, nondistended. Positive bowel sounds. No organomegaly. GENITOURINARY: Normal development. No abnormalities noted. MUSCULOSKELETAL: Full range of motion throughout. Moves all extremities spontaneously. No focal swelling. SKIN: Warm, dry, and intact. No rashes or lesions noted on exam. NEUROLOGIC: Cranial nerves II through XII are grossly intact. No acute focal deficits. PSYCHIATRIC: Appropriate mood and affect.  LABORATORY DATA: White count 8, hemoglobin 14.4,  hematocrit 44.2, platelets 180. Sodium 139, potassium 3.9, chloride 104, bicarbonate  28, creatinine 1.02, BUN 15, glucose 147, calcium 9.1. BNP 1468. Troponin less than 0.02. Chest x-ray was performed, showed small right pleural effusion with right basilar atelectasis. EKG performed showed atrial fibrillation.  ASSESSMENT AND PLAN: This is a 74 year old Caucasian male with past medical history as per history of present illness, who presented in hypertensive emergency and atrial fibrillation, with subsequent acute congestive heart failure.  1.  Hypertensive emergency. The patient's blood pressure was in the 210s over 120s when he arrived in the Emergency Department, coming down only to the 190s over 110s after IV labetalol. He has a history of hypertension, and is compliant on his medications at home. However, his blood pressure was still very elevated on admission today. He will be admitted to the stepdown unit and will continue to get IV labetalol to control his blood pressures. We will switch his home metoprolol to Coreg starting at 6.25 mg b.i.d. If this is not enough, then he may need to be placed on drip medication such as nicardipine.  2.  Atrial fibrillation. He is followed by Dr. Nehemiah Massed, who is his cardiologist. We will consult Dr. Nehemiah Massed. He seems to be in persistent atrial fibrillation despite being on propafenone, though he does state that he missed the dose of that medication this morning. We will continue his propafenone here and also continue his beta blocker, though we are switching his metoprolol to Coreg, as above. Continue his Coumadin. 3.  Acute congestive heart failure. This is likely secondary to his hypertensive emergency and atrial fibrillation. We will control his blood pressure as stated above, consult cardiology as above. Cardiac enzymes are negative so far. We will trend those. We will also get an echocardiogram. 4.  Supratherapeutic INR, measured at 3.2 in the Emergency  Department. This is barely high. We will hold his Coumadin dose today and continue regular Coumadin dosing tomorrow. We will follow his INR while he is an inpatient. 5.  Diabetes mellitus. He reports good control with metformin orally and glipizide. We will check his hemoglobin A1c here. We will continue his metformin. We will hold his glipizide while inpatient, and use sliding scale insulin.  CODE STATUS: Full code.  TIME SPENT: 50 minutes.     ____________________________ Wilford Corner. Jannifer Franklin, MD dfw:cg D: 10/07/2013 22:23:49 ET T: 10/08/2013 01:56:15 ET JOB#: [id number]  cc: Wilford Corner. Jannifer Franklin, MD, <Dictator> Zelie Asbill Fawn Kirk MD ELECTRONICALLY SIGNED 11/01/2013 9:44

## 2014-06-17 NOTE — H&P (Signed)
PATIENT NAME:  Todd Valentine, Todd Valentine MR#:  [id number] DATE OF BIRTH:  1940/07/30  DATE OF ADMISSION:  10/02/2013  PRIMARY CARE PHYSICIAN: Dr. Johny Drilling  HISTORY OF PRESENT ILLNESS: This is a 74 year old Caucasian male with a past medical history of hypertension, atrial fibrillation, and diabetes, who presented with progressive dyspnea on exertion and orthopnea, was found to be in hypertensive emergency with subsequent acute heart failure. The patient states that, for the past couple of months, he has been feeling more fatigued. He has been having more dyspnea on exertion, progressively worsening. At first, he found that it was more difficult for him to engage in activities such as golf, then it became more difficult for him to walk distances such as from his deck to his building behind his house. He also noted that he began to have more orthopnea. He began to feel short of breath as he lying flat or on one pillow. He started falling asleep sitting up in his chair in order to be comfortable and to be able to sleep at night. These last changes were over the last couple of weeks, until he reached the point where he felt like he needed to come in. In the Emergency Room, he was found to be in hypertensive emergency with his blood pressures in the 210s over 120s range. Per the ED initial report, after he was given a dose of IV labetalol, his pressures only came down to the 190s over 110s. He is on prescription antihypertensives and reports compliance with these medications. For his atrial fibrillation, he is seen by his cardiologist, Dr. Nehemiah Massed. Of note, he is also on Coumadin for stroke prophylaxis secondary to his atrial fibrillation. He was found in the ED to have an INR of 3.2, slightly supratherapeutic. At this point, the hospitalist service was asked to evaluate him for admission.  PAST MEDICAL HISTORY: Type 2 diabetes, hypertension, atrial fibrillation.  HOME MEDICATIONS: Ambien 10 mg 1 tablet daily,  sertraline 50 mg 1 tablet daily, propafenone 225 mg 1 tablet 3 times a day, hydrochlorothiazide   DICTATION ENDS HERE   Dictation ended early, see complete H&P for same service date     ____________________________ Wilford Corner. Jannifer Franklin, MD dfw:cg D: 10/07/2013 22:07:16 ET T: 10/08/2013 01:00:19 ET JOB#: [id number]  cc: Wilford Corner. Jannifer Franklin, MD, <Dictator> Kensli Bowley Fawn Kirk MD ELECTRONICALLY SIGNED 11/01/2013 9:45

## 2014-06-17 NOTE — Discharge Summary (Signed)
PATIENT NAME:  Todd Valentine, Todd Valentine MR#:  [id number] DATE OF BIRTH:  08/26/1940  DATE OF ADMISSION:  10/02/2013 DATE OF DISCHARGE:  10/06/2013  ADDENDUM  The patient will be discharged 10/06/2013 after his cardiac catheterization.   ____________________________ Donell Beers. Todd Karvonen, MD spm:DT D: 10/06/2013 12:48:00 ET T: 10/06/2013 14:09:13 ET JOB#: [id number]  cc: Romyn Boswell P. Todd Karvonen, MD, <Dictator> Donell Beers Laron Boorman MD ELECTRONICALLY SIGNED 10/07/2013 14:03

## 2014-06-17 NOTE — Consult Note (Signed)
PATIENT NAME:  Todd Valentine, Todd Valentine MR#:  [id number] DATE OF BIRTH:  October 06, 1940  DATE OF CONSULTATION:  10/03/2013  REFERRING PHYSICIAN:  Dr. Jannifer Franklin CONSULTING PHYSICIAN:  [name], MD  REASON FOR CONSULTATION: Acute congestive heart failure, atrial fibrillation with rapid ventricular response, and hypertensive emergency.  CHIEF COMPLAINT: "I'm short of breath."  HISTORY OF PRESENT ILLNESS: This is a 74 year old male with history of atrial fibrillation with variable rate, on appropriate medication management, who has also been on anticoagulation. The patient has stblitiy withtthis issue. She has remote tobacco use, although has not had any recent significant cough or congestion. Recently had significant progression of hypertension, shortness of breath, chest pain in upper chest radiating into his back and having some weakness and fatigue as well as beta blocker for better heart rate control. The patient has had improvements of symptoms overnight. The patient does have diabetes as well for which he has further need for treatment.   REVIEW OF SYSTEMS: The remainder of review of systems is negative for syncope, dizziness, nausea, diaphoresis, vision change, ringing in the ears, hearing loss, cough, congestion, weakness, fatigue, frequent urination, urination at night, muscle weakness, numbness, anxiety, depression, skin lesions or skin rashes.   PAST MEDICAL HISTORY:  1.  Valvular heart disease.  2.  Atrial fibrillation.  3.  Diabetes.  4.  Hypertension.   FAMILY HISTORY: No family members with early onset of cardiovascular disease or hypertension.   SOCIAL HISTORY: Currently denies alcohol or tobacco use.   ALLERGIES: As listed.   MEDICATIONS: As listed.  PHYSICAL EXAMINATION: VITAL SIGNS: Blood pressure is 162/72 bilaterally and heart rate is 100 upright and reclining and is irregular.  GENERAL: He is a well appearing male in no acute distress.  HEENT: No icterus, thyromegaly,  ulcers, hemorrhage, or xanthelasma.  HEART: Irregularly irregular. Normal S1 and S2. A 2/6 right upper sternal border murmur, nonradiating. PMI is diffuse. Carotid upstroke normal without bruit. Jugular venous pressure is normal.  LUNGS: Have a few expiratory wheezes but no evidence of rales.  ABDOMEN: Soft and nontender. Cannot assess hepatosplenomegaly or masses. Abdominal aorta is normal size without bruit.  EXTREMITIES: 2+ radial, femoral and dorsal pedal pulses with trace lower extremity edema. No cyanosis, clubbing or ulcers.  NEUROLOGIC: He is oriented to time, place, and person with normal mood and affect.   ASSESSMENT: A 74 year old male with hypertension, atrial fibrillation, diabetes, and hyperlipidemia with acute hypertensive emergency and atrial fibrillation with rapid ventricular rate and acute diastolic dysfunction congestive heart failure.   RECOMMENDATIONS: 1.  Repeat echocardiogram for further evaluation of valvular heart disease and left ventricular systolic dysfunction contributing to above.  2.  Continue atrial fibrillation heart rate control with beta blocker.  3.  Lasix for lower extremity edema and/or pulmonary edema. 4.  Further evaluation of lung issues and antibiotics as necessary.  5.  Anticoagulation for risk reduction in stroke with atrial fibrillation, although hold at this time for the possibility of cardiac catheterization to assess coronary anatomy as a cause of above despite normal troponin and adjustments of medications. The patient understands the risks and benefits of cardiac catheterization. These include the possibility of death, stroke, heart attack, infection, bleeding and blood clot. He is at low risk for conscious sedation.  ____________________________ [name], MD bjk:sb D: 10/03/2013 08:42:54 ET T: 10/03/2013 09:19:00 ET JOB#: [id number]  cc: [name], MD, <Dictator> [name] MD ELECTRONICALLY SIGNED 10/05/2013 10:17

## 2014-06-17 NOTE — Discharge Summary (Signed)
PATIENT NAME:  [name], Todd Valentine MR#:  [id number] DATE OF BIRTH:  May 30, 1940  DATE OF ADMISSION:  10/02/2013 DATE OF DISCHARGE:    ADDENDUM TO DICTATION:  The patient underwent a cardiac catheterization which showed mild 3-vessel CAD and an occluded diagonal artery with normal pulmonary pressures. The patient has had recent stress test showing normal myocardial perfusion with no evidence of change in EF from previous echocardiogram. As per cardiology  heart rate control for atrial fibrillation and outpatient need for adjustments. The patient was stable for discharge and will need close follow next week.    ____________________________ Yoandri Congrove P. [name], MD spm:lt D: 10/06/2013 17:31:43 ET T: 10/06/2013 17:51:59 ET JOB#: [id number]  cc: Mikaeel Petrow P. [name], MD, <Dictator> Donell Beers Alvin Rubano MD ELECTRONICALLY SIGNED 10/07/2013 14:04

## 2014-06-17 NOTE — Consult Note (Signed)
PATIENT NAME:  Todd Valentine, GOETZE MR#:  [id number] DATE OF BIRTH:  28-Feb-1940  DATE OF CONSULTATION:  10/05/2013  REFERRING PHYSICIAN:   CONSULTING PHYSICIAN:  [name], MD  CARDIAC NOTE:  Patient has had no evidence of further chest pain or shortness of breath, and acute on chronic diastolic dysfunction congestive heart failure is improving. He has had some weight loss, which is improving as well, and the patient has a normal troponin without evidence of myocardial infarction, but INR is 2.0 and unable to further evaluate by cardiac catheterization with right and left heart catheterization. His review of systems is negative other than mild shortness of breath, and his physical examination is unchanged.  PLAN:  Further continue medication management for acute on chronic diastolic dysfunction heart failure and cardiac catheterization in the morning.    ____________________________ [name], MD bjk:TT D: 10/06/2013 08:39:53 ET T: 10/06/2013 11:08:29 ET JOB#: [id number]  cc: [name], MD, <Dictator> [name] MD ELECTRONICALLY SIGNED 10/06/2013 12:48

## 2014-06-17 NOTE — Discharge Summary (Signed)
PATIENT NAME:  Todd Valentine, Todd Valentine MR#:  [id number] DATE OF BIRTH:  08-03-1940  DATE OF ADMISSION:  10/02/2013 DATE OF DISCHARGE: 10/05/2013   ADMISSION DIAGNOSES:  1.  Hypertensive emergency.  2.  Atrial fibrillation.   DISCHARGE DIAGNOSES:  1.  Acute diastolic congestive heart failure.  2.  Atrial fibrillation and rapid ventricular response, resolved. 3.  Malignant hypertension.  4.  Diabetes.   CONSULTATIONS: Dr. Nehemiah Massed.   PERTINENT LABORATORY AND DIAGNOSTICS:  1.  INR is 2.0 at discharge. Sodium 138, potassium 3.4, chloride 99, bicarbonate 29, BUN 25, creatinine 1.4, glucose 140.  2.  Troponins x 3 were negative.  3.  TSH 1.32, LDL 104, triglycerides 187, cholesterol 169, HDL 28.  4.  A 2-D echocardiogram showed normal ejection fraction of 55% to 60% with mild to moderate mitral valve regurgitation.   HOSPITAL COURSE: A 74 year old male with history of hypertension who presented with malignant hypertension, shortness of breath. For further details, please refer to the H and P.   Acute diastolic heart failure. The patient was noted to have worsening dyspnea and noted to be in atrial fibrillation/RVR and also congestive heart failure. I suspect that his acute diastolic congestive heart failure is actually exacerbated by his atrial fibrillation/RVR as well as his malignant hypertension. His echocardiogram showed normal ejection fraction. He is dramatically improved on Lasix. Plan was for cardiac catheterization due to his shortness of breath; however, due to INR being 2.0 as the patient is in atrial fibrillation this has been on hold and will be performed as an outpatient. I spoke with Dr. Nehemiah Massed regarding a cardiac catheterization, likely this will show minimal CAD and possible pulmonary hypertension, but we I do not expect any major abnormalities. His echocardiogram, as mentioned, showed no wall motion abnormality and a normal ejection fraction. He also snores at night and he may have  some sleep apnea and this should be evaluated as an outpatient by his primary care physician.   Atrial fibrillation and RVR. The patient presented with this. His blood pressure medications and heart rate medications have been titrated. He is now on Coreg and diltiazem. His rate is controlled. He will continue on propafenone. Cardiology consult was appreciated. Right now, his Coumadin has been on hold for planned outpatient cardiac catheterization.   Malignant hypertension, improved with addition of medications. He is on Coreg, clonidine, lisinopril/hydrochlorothiazide, and Lasix, and labetalol, and hydralazine p.r.n. He was initially started on Norvasc as well, but given the new medications and the increase in all these I will not discharge the patient with Norvasc.   Diabetes. We are holding metformin in anticipation of cardiac catheterization, but should be restarted 2 days after cardiac catheterization.   DISCHARGE MEDICATIONS:  1.  Hydrochlorothiazide/ lisinopril 12.5/20 daily.  2.  Zoloft 50 mg daily.  3.  Propafenone 225 mg t.i.d.  4.  Glimepiride 4 mg daily.  5.  Ambien 10 mg at bedtime p.r.n.  6.  Clonidine 0.1 mg b.i.d.  7.  Coreg 25 mg b.i.d.  8.  Diltiazem 120 mg daily.  9.  Lasix 20 mg daily.   DISCHARGE DIET: Low-sodium diet.   DISCHARGE FOLLOWUP: In 1-2 days with Dr. Nehemiah Massed. The patient will hold Coumadin and metformin until followup with Dr. Nehemiah Massed.   TIME SPENT: Thirty-five minutes.   Plan of care was discussed with the family and the patient.   ____________________________ Todd Valentine P. [name], MD spm:TT D: 10/05/2013 12:32:43 ET T: 10/05/2013 13:30:00 ET JOB#: [id number]  cc: Todd Valentine P. Todd Andersson,  MD, <Dictator> Todd Skains, MD Todd Castle, MD Todd Beers Sire Poet MD ELECTRONICALLY SIGNED 10/06/2013 13:14

## 2014-08-07 ENCOUNTER — Ambulatory Visit (INDEPENDENT_AMBULATORY_CARE_PROVIDER_SITE_OTHER): Payer: Medicare Other | Admitting: Cardiovascular Disease

## 2014-08-07 ENCOUNTER — Encounter: Payer: Self-pay | Admitting: Cardiovascular Disease

## 2014-08-07 VITALS — BP 140/74 | HR 77 | Ht 69.0 in | Wt 225.5 lb

## 2014-08-07 DIAGNOSIS — E118 Type 2 diabetes mellitus with unspecified complications: Secondary | ICD-10-CM

## 2014-08-07 DIAGNOSIS — I4891 Unspecified atrial fibrillation: Secondary | ICD-10-CM

## 2014-08-07 DIAGNOSIS — I5033 Acute on chronic diastolic (congestive) heart failure: Secondary | ICD-10-CM | POA: Diagnosis not present

## 2014-08-07 NOTE — Assessment & Plan Note (Signed)
Recommended a low carbohydrate diet, regular exercise program

## 2014-08-07 NOTE — Assessment & Plan Note (Signed)
Heart rate well controlled. Tolerating anticoagulation He does have fatigue in the morning after taking all of his pills at one time. Recommended he try to take his diltiazem in the evening If he continues to have symptoms, recommended he call he office

## 2014-08-07 NOTE — Progress Notes (Signed)
Patient ID: BRAYAN KENNARD, male    DOB: [birth year], 74 y.o.   MRN: [id number]  HPI Comments:  Mr. Caravantes is a very pleasant 74 year old gentleman with long history of smoking for 30-40 years, some alcohol use, obesity, chronic diastolic CHF, chronic atrial fibrillation, hypertension, type 2 diabetes, presenting for follow-up of his atrial fibrillation  He reports doing well, but does have some fatigue late morning after taking all his medications Weight has been relatively stable. He is taking Lasix twice a day on average, occasionally has to take an extra pill No significant leg edema, no abdominal swelling Reports his heart rate is typically well controlled. He uses a wrist monitor Denies having any complications on pradaxa and prefers this regimen better than other anticoagulation such as warfarin Overall he has no complaints.  EKG on today's visit shows atrial fibrillation with rate 77 bpm, right bundle branch block  Other past medical history  hospital admission 10/02/2013 with discharge August 12. He presented with shortness of breath, Atrial fibrillation with RVR. Echocardiogram showed normal ejection fraction. Symptoms improved with diuresis.  Echocardiogram 10/02/2013 showing ejection fraction 55-60%, mildly dilated left atrium, mild to moderate MR and TR  He had cardiac catheterization 10/06/2013 showing occluded diagonal vessel, 40% proximal LAD disease, bifurcating marginal branch/ramus. Ramus had 60% followed by 80% disease, OM with 50% mid disease at least. RCA with 40% mid disease. Medical management recommended  Most recent hemoglobin A1c 7.1 up from 6.9 Previously was on simvastatin. Currently not on simvastatin for uncertain reasons. On the simvastatin, total cholesterol was 139, LDL 50  Prior weight at the time of hospital discharge was 218 pounds, now he reports 228 pounds at home, 230 pounds in our clinic  Allergies  Allergen Reactions  . Ciprofloxacin    Other reaction(s): Other (See Comments) Pt states he could hardly move  . Sitagliptin     Other reaction(s): Other (See Comments) Weakness    Current Outpatient Prescriptions on File Prior to Visit  Medication Sig Dispense Refill  . aspirin 81 MG tablet Take 81 mg by mouth daily.    . carvedilol (COREG) 25 MG tablet Take 1 tablet (25 mg total) by mouth 2 (two) times daily with a meal. 60 tablet 3  . dabigatran (PRADAXA) 150 MG CAPS capsule Take 150 mg by mouth 2 (two) times daily.    [name] diltiazem (DILACOR XR) 120 MG 24 hr capsule Take 1 capsule (120 mg total) by mouth daily. 90 capsule 3  . furosemide (LASIX) 20 MG tablet Take 1 tablet (20 mg total) by mouth 2 (two) times daily as needed. 180 tablet 3  . glimepiride (AMARYL) 4 MG tablet Take 4 mg by mouth daily with breakfast.    . hydrALAZINE (APRESOLINE) 25 MG tablet Take 25 mg by mouth 3 (three) times daily.     [name] HYDROcodone-acetaminophen (NORCO/VICODIN) 5-325 MG per tablet Take 1 tablet by mouth every 6 (six) hours as needed.     [name] lisinopril-hydrochlorothiazide (PRINZIDE,ZESTORETIC) 20-12.5 MG per tablet Take 1 tablet by mouth daily. 30 tablet 6  . magnesium oxide (MAG-OX) 400 MG tablet Take 400 mg by mouth daily.    . metformin (FORTAMET) 1000 MG (OSM) 24 hr tablet Take 1,000 mg by mouth 2 (two) times daily with a meal.     . nitroGLYCERIN (NITROSTAT) 0.4 MG SL tablet Place 1 tablet (0.4 mg total) under the tongue every 5 (five) minutes as needed for chest pain. 25 tablet 3  . OMEGA-3 FATTY  ACIDS PO Take 1 tablet by mouth twice a day.    . potassium chloride (K-DUR) 10 MEQ tablet Take 1 tablet (10 mEq total) by mouth 2 (two) times daily as needed. 180 tablet 3  . simvastatin (ZOCOR) 40 MG tablet Take 1 tablet (40 mg total) by mouth at bedtime. 90 tablet 3  . tamsulosin (FLOMAX) 0.4 MG CAPS capsule Take 0.4 mg by mouth daily.     [name] zolpidem (AMBIEN) 10 MG tablet Take 5 mg by mouth at bedtime as needed for sleep.     No current  facility-administered medications on file prior to visit.    Past Medical History  Diagnosis Date  . CHF (congestive heart failure)   . A-fib   . Acute renal failure   . Systolic CHF   . Hyperkalemia   . Hyperlipidemia   . Diabetes mellitus without complication   . Coronary artery disease 10/06/2013    [facility]  . Hypertension   . BPH (benign prostatic hyperplasia)   . History of diverticulitis   . Systolic murmur   . Depression   . Malignant melanoma   . Chronic insomnia     Past Surgical History  Procedure Laterality Date  . Cardiac catheterization  10/06/2013    Social History  reports that he does not drink alcohol or use illicit drugs.  Family History Family history is unknown by patient.   Review of Systems  Constitutional: Negative.   Respiratory: Negative.   Cardiovascular: Negative.   Gastrointestinal: Negative.   Neurological: Negative.   Psychiatric/Behavioral: Negative.   All other systems reviewed and are negative.   BP 140/74 mmHg  Pulse 77  Ht 5\' 9"  (1.753 m)  Wt 225 lb 8 oz (102.286 kg)  BMI 33.29 kg/m2  Physical Exam  Constitutional: He is oriented to person, place, and time. He appears well-developed and well-nourished.  obese  HENT:  Head: Normocephalic.  Nose: Nose normal.  Mouth/Throat: Oropharynx is clear and moist.  Eyes: Conjunctivae are normal. Pupils are equal, round, and reactive to light.  Neck: Normal range of motion. Neck supple. No JVD present.  Cardiovascular: Normal rate, S1 normal, S2 normal, normal heart sounds and intact distal pulses.  An irregularly irregular rhythm present. Exam reveals no gallop and no friction rub.   No murmur heard. 1 plus pitting edema   Pulmonary/Chest: Effort normal and breath sounds normal. No respiratory distress. He has no wheezes. He has no rales. He exhibits no tenderness.  Unable to exclude dose at the left base  Abdominal: Soft. Bowel sounds are normal. He exhibits no distension. There is  no tenderness.  Musculoskeletal: Normal range of motion. He exhibits no edema or tenderness.  Lymphadenopathy:    He has no cervical adenopathy.  Neurological: He is alert and oriented to person, place, and time. Coordination normal.  Skin: Skin is warm and dry. No rash noted. No erythema.  Psychiatric: He has a normal mood and affect. His behavior is normal. Judgment and thought content normal.      Assessment and Plan   Nursing note and vitals reviewed.

## 2014-08-07 NOTE — Assessment & Plan Note (Signed)
We have encouraged continued exercise, careful diet management in an effort to lose weight. 

## 2014-08-07 NOTE — Assessment & Plan Note (Signed)
Appears relatively euvolemic on today's visit. Weight has been stable on Lasix twice a day dosing Occasionally has to take extra Lasix for weight gain

## 2014-08-07 NOTE — Patient Instructions (Addendum)
You are doing well. No medication changes were made.  We will check Labs today, BMP  Please call us if you have new issues that need to be addressed before your next appt.  Your physician wants you to follow-up in: 12 months.  You will receive a reminder letter in the mail two months in advance. If you don't receive a letter, please call our office to schedule the follow-up appointment.

## 2014-08-08 LAB — BASIC METABOLIC PANEL
BUN/Creatinine Ratio: 16 (ref 10–22)
BUN: 20 mg/dL (ref 8–27)
CO2: 26 mmol/L (ref 18–29)
Calcium: 9.8 mg/dL (ref 8.6–10.2)
Chloride: 103 mmol/L (ref 97–108)
Creatinine, Ser: 1.28 mg/dL — ABNORMAL HIGH (ref 0.76–1.27)
GFR calc Af Amer: 64 mL/min/{1.73_m2} (ref >59)
GFR calc non Af Amer: 55 mL/min/{1.73_m2} — ABNORMAL LOW (ref >59)
Glucose: 162 mg/dL — ABNORMAL HIGH (ref 65–99)
Potassium: 4.7 mmol/L (ref 3.5–5.2)
Sodium: 143 mmol/L (ref 134–144)

## 2014-08-31 ENCOUNTER — Other Ambulatory Visit: Payer: Self-pay | Admitting: *Deleted

## 2014-08-31 MED ORDER — CARVEDILOL 25 MG PO TABS: 25.0000 mg | ORAL_TABLET | Freq: Two times a day (BID) | ORAL | Status: DC

## 2014-09-12 ENCOUNTER — Other Ambulatory Visit: Payer: Self-pay | Admitting: Cardiovascular Disease

## 2014-10-25 ENCOUNTER — Telehealth: Payer: Self-pay

## 2014-10-25 NOTE — Telephone Encounter (Signed)
Received cardiac clearance request from [facility] for pt to proceed w/ colonscopy on 11/23/14 w/ Dr. Vira Agar.  Per Christell Faith, PA "Pt has been cleared for procedure.  May hold Pradaxa for 2-3 days prior, restart 24 hrs post procedure or when able per treating MD." Faxed to [phone].

## 2014-11-07 ENCOUNTER — Other Ambulatory Visit: Payer: Self-pay | Admitting: *Deleted

## 2014-11-07 ENCOUNTER — Telehealth: Payer: Self-pay | Admitting: *Deleted

## 2014-11-07 MED ORDER — HYDRALAZINE HCL 25 MG PO TABS: 25.0000 mg | ORAL_TABLET | Freq: Three times a day (TID) | ORAL | Status: DC

## 2014-11-07 NOTE — Telephone Encounter (Signed)
  1. Which medications need to be refilled? Hydralazine 25mg   2. Which pharmacy is medication to be sent to? [facility] in [place]  3. Do they need a 30 day or 90 day supply? 90 day  4. Would they like a call back once the medication has been sent to the pharmacy? Yes

## 2014-11-07 NOTE — Telephone Encounter (Signed)
Rx sent to [facility] Hydralazine #270 R#3.

## 2014-11-10 ENCOUNTER — Other Ambulatory Visit: Payer: Self-pay | Admitting: *Deleted

## 2014-11-10 MED ORDER — HYDRALAZINE HCL 25 MG PO TABS: 25.0000 mg | ORAL_TABLET | Freq: Three times a day (TID) | ORAL | Status: DC

## 2014-11-22 ENCOUNTER — Encounter: Payer: Self-pay | Admitting: *Deleted

## 2014-11-23 ENCOUNTER — Ambulatory Visit
Admission: RE | Admit: 2014-11-23 | Discharge: 2014-11-23 | Disposition: A | Discharge status: Home / Self Care | Payer: Medicare Other | Source: Ambulatory Visit | Attending: Unknown Physician Specialty | Admitting: Unknown Physician Specialty

## 2014-11-23 ENCOUNTER — Encounter: Payer: Self-pay | Admitting: Certified Registered Nurse Anesthetist

## 2014-11-23 ENCOUNTER — Encounter
Admission: RE | Disposition: A | Discharge status: Home / Self Care | Payer: Self-pay | Source: Ambulatory Visit | Attending: Unknown Physician Specialty

## 2014-11-23 ENCOUNTER — Encounter: Payer: Self-pay | Admitting: Unknown Physician Specialty

## 2014-11-23 DIAGNOSIS — K5730 Diverticulosis of large intestine without perforation or abscess without bleeding: Secondary | ICD-10-CM | POA: Diagnosis not present

## 2014-11-23 DIAGNOSIS — G4700 Insomnia, unspecified: Secondary | ICD-10-CM | POA: Diagnosis not present

## 2014-11-23 DIAGNOSIS — I2510 Atherosclerotic heart disease of native coronary artery without angina pectoris: Secondary | ICD-10-CM | POA: Diagnosis not present

## 2014-11-23 DIAGNOSIS — Z85820 Personal history of malignant melanoma of skin: Secondary | ICD-10-CM | POA: Diagnosis not present

## 2014-11-23 DIAGNOSIS — Z881 Allergy status to other antibiotic agents status: Secondary | ICD-10-CM | POA: Diagnosis not present

## 2014-11-23 DIAGNOSIS — I4891 Unspecified atrial fibrillation: Secondary | ICD-10-CM | POA: Insufficient documentation

## 2014-11-23 DIAGNOSIS — E119 Type 2 diabetes mellitus without complications: Secondary | ICD-10-CM | POA: Insufficient documentation

## 2014-11-23 DIAGNOSIS — E785 Hyperlipidemia, unspecified: Secondary | ICD-10-CM | POA: Insufficient documentation

## 2014-11-23 DIAGNOSIS — Z7982 Long term (current) use of aspirin: Secondary | ICD-10-CM | POA: Insufficient documentation

## 2014-11-23 DIAGNOSIS — N400 Enlarged prostate without lower urinary tract symptoms: Secondary | ICD-10-CM | POA: Insufficient documentation

## 2014-11-23 DIAGNOSIS — I5022 Chronic systolic (congestive) heart failure: Secondary | ICD-10-CM | POA: Insufficient documentation

## 2014-11-23 DIAGNOSIS — Z1211 Encounter for screening for malignant neoplasm of colon: Secondary | ICD-10-CM | POA: Insufficient documentation

## 2014-11-23 DIAGNOSIS — I10 Essential (primary) hypertension: Secondary | ICD-10-CM | POA: Insufficient documentation

## 2014-11-23 DIAGNOSIS — F329 Major depressive disorder, single episode, unspecified: Secondary | ICD-10-CM | POA: Insufficient documentation

## 2014-11-23 DIAGNOSIS — Z9109 Other allergy status, other than to drugs and biological substances: Secondary | ICD-10-CM | POA: Diagnosis not present

## 2014-11-23 DIAGNOSIS — Z79899 Other long term (current) drug therapy: Secondary | ICD-10-CM | POA: Insufficient documentation

## 2014-11-23 DIAGNOSIS — Z800 Family history of malignant neoplasm of digestive organs: Secondary | ICD-10-CM | POA: Insufficient documentation

## 2014-11-23 DIAGNOSIS — K640 First degree hemorrhoids: Secondary | ICD-10-CM | POA: Diagnosis not present

## 2014-11-23 HISTORY — PX: COLONOSCOPY WITH PROPOFOL: SHX5780

## 2014-11-23 LAB — GLUCOSE, CAPILLARY: Glucose-Capillary: 155 mg/dL — ABNORMAL HIGH (ref 65–99)

## 2014-11-23 SURGERY — COLONOSCOPY WITH PROPOFOL
Anesthesia: General

## 2014-11-23 MED ORDER — FENTANYL CITRATE (PF) 100 MCG/2ML IJ SOLN
INTRAMUSCULAR | Status: AC
  Filled 2014-11-23: qty 4

## 2014-11-23 MED ORDER — FENTANYL CITRATE (PF) 100 MCG/2ML IJ SOLN
INTRAMUSCULAR | Status: DC | PRN
  Administered 2014-11-23: 75 ug via INTRAVENOUS

## 2014-11-23 MED ORDER — SODIUM CHLORIDE 0.9 % IV SOLN
INTRAVENOUS | Status: DC
  Administered 2014-11-23: 1000 mL via INTRAVENOUS

## 2014-11-23 MED ORDER — SODIUM CHLORIDE 0.9 % IV SOLN: INTRAVENOUS | Status: DC

## 2014-11-23 MED ORDER — MIDAZOLAM HCL 5 MG/5ML IJ SOLN
INTRAMUSCULAR | Status: DC | PRN
  Administered 2014-11-23: 1 mg via INTRAVENOUS
  Administered 2014-11-23: 2 mg via INTRAVENOUS

## 2014-11-23 MED ORDER — DIPHENHYDRAMINE HCL 50 MG/ML IJ SOLN
INTRAMUSCULAR | Status: AC
  Filled 2014-11-23: qty 1

## 2014-11-23 MED ORDER — DIPHENHYDRAMINE HCL 50 MG/ML IJ SOLN
12.5000 mg | Freq: Once | INTRAMUSCULAR | Status: AC
  Administered 2014-11-23: 50 mg via INTRAVENOUS

## 2014-11-23 MED ORDER — MIDAZOLAM HCL 5 MG/5ML IJ SOLN
INTRAMUSCULAR | Status: AC
  Filled 2014-11-23: qty 5

## 2014-11-23 NOTE — Op Note (Signed)
[facility] Patient Name: Todd Valentine Procedure Date: 11/23/2014 8:56 AM MRN: [id number] Account #: [id number] Date of Birth: 09-Dec-1940 Admit Type: Outpatient Age: 74 Room: [facility] ENDO ROOM 1 Gender: Male Note Status: Finalized Procedure:         Colonoscopy Indications:       Family history of colon cancer in a first-degree relative,                     Personal history of colonic polyps Providers:         Manya Silvas, MD Referring MD:      Wynona Canes. Kym Groom, MD (Referring MD) Medicines:         Fentanyl 75 micrograms IV, Midazolam 3 mg IV,                     Diphenhydramine [id number] mg IV Complications:     No immediate complications. Procedure:         Pre-Anesthesia Assessment:                    - After reviewing the risks and benefits, the patient was                     deemed in satisfactory condition to undergo the procedure.                    After obtaining informed consent, the colonoscope was                     passed under direct vision. Throughout the procedure, the                     patient's blood pressure, pulse, and oxygen saturations                     were monitored continuously. The Colonoscope was                     introduced through the anus and advanced to the the cecum,                     identified by appendiceal orifice and ileocecal valve. The                     colonoscopy was performed without difficulty. The patient                     tolerated the procedure well. The quality of the bowel                     preparation was good. Findings:      Multiple medium-mouthed diverticula were found in the sigmoid colon, in       the descending colon and in the transverse colon.      Internal hemorrhoids were found during endoscopy. The hemorrhoids were       small, medium-sized and Grade I (internal hemorrhoids that do not       prolapse).      A few scattered non-bleeding very superficial erosions were found  in the       proximal ascending colon and at the ileocecal valve. No stigmata of       recent bleeding were seen. Impression:        - Diverticulosis in the sigmoid  colon, in the descending                     colon and in the transverse colon.                    - Internal hemorrhoids.                    - A few erosions in the proximal ascending colon and at                     the ileocecal valve.                    - No specimens collected. Recommendation:    - Repeat colonoscopy in 5 years for surveillance. If in                     good health. Manya Silvas, MD 11/23/2014 9:50:57 AM This report has been signed electronically. Number of Addenda: 0 Note Initiated On: 11/23/2014 8:56 AM Total Procedure Duration: 0 hours 16 minutes 8 seconds       [facility]

## 2014-11-23 NOTE — H&P (Signed)
Primary Care Physician:  [name], MD Primary Gastroenterologist:  Dr. Vira Agar  Pre-Procedure History & Physical: HPI:  Todd Valentine is a 74 y.o. male is here for an colonoscopy.   Past Medical History  Diagnosis Date  . CHF (congestive heart failure)   . A-fib   . Acute renal failure   . Systolic CHF   . Hyperkalemia   . Hyperlipidemia   . Diabetes mellitus without complication   . Coronary artery disease 10/06/2013    [facility]  . Hypertension   . BPH (benign prostatic hyperplasia)   . History of diverticulitis   . Systolic murmur   . Depression   . Malignant melanoma   . Chronic insomnia     Past Surgical History  Procedure Laterality Date  . Cardiac catheterization  10/06/2013  . Mohs surgery    . Appendectomy    . Removal tubular adenoma    . Colonoscopy      Prior to Admission medications   Medication Sig Start Date End Date Taking? Authorizing Provider  aspirin 81 MG tablet Take 81 mg by mouth daily.   Yes Historical Provider, MD  carvedilol (COREG) 25 MG tablet Take 1 tablet (25 mg total) by mouth 2 (two) times daily with a meal. 08/31/14  Yes Minna Merritts, MD  diltiazem (DILACOR XR) 120 MG 24 hr capsule Take 1 capsule (120 mg total) by mouth daily. 03/28/14  Yes Minna Merritts, MD  furosemide (LASIX) 20 MG tablet Take 1 tablet (20 mg total) by mouth 2 (two) times daily as needed. 01/02/14  Yes Minna Merritts, MD  glimepiride (AMARYL) 4 MG tablet Take 4 mg by mouth daily with breakfast.   Yes Historical Provider, MD  hydrALAZINE (APRESOLINE) 25 MG tablet Take 1 tablet (25 mg total) by mouth 3 (three) times daily. 11/10/14  Yes Minna Merritts, MD  HYDROcodone-acetaminophen (NORCO/VICODIN) 5-325 MG per tablet Take 1 tablet by mouth every 6 (six) hours as needed.  01/09/14  Yes Historical Provider, MD  lisinopril-hydrochlorothiazide (PRINZIDE,ZESTORETIC) 20-12.5 MG per tablet Take 1 tablet by mouth daily. 04/24/14  Yes Minna Merritts, MD   magnesium oxide (MAG-OX) 400 MG tablet Take 400 mg by mouth daily.   Yes Historical Provider, MD  metformin (FORTAMET) 1000 MG (OSM) 24 hr tablet Take 1,000 mg by mouth 2 (two) times daily with a meal.  12/18/10  Yes Historical Provider, MD  nitroGLYCERIN (NITROSTAT) 0.4 MG SL tablet Place 1 tablet (0.4 mg total) under the tongue every 5 (five) minutes as needed for chest pain. 01/05/14  Yes Minna Merritts, MD  OMEGA-3 FATTY ACIDS PO Take 1 tablet by mouth twice a day.   Yes Historical Provider, MD  potassium chloride (K-DUR) 10 MEQ tablet Take 1 tablet (10 mEq total) by mouth 2 (two) times daily as needed. 01/02/14  Yes Minna Merritts, MD  PRADAXA 150 MG CAPS capsule TAKE ONE CAPSULE BY MOUTH TWICE DAILY 09/12/14  Yes Minna Merritts, MD  simvastatin (ZOCOR) 40 MG tablet Take 1 tablet (40 mg total) by mouth at bedtime. 01/02/14  Yes Minna Merritts, MD  tamsulosin (FLOMAX) 0.4 MG CAPS capsule Take 0.4 mg by mouth daily.  12/10/13  Yes Historical Provider, MD  zolpidem (AMBIEN) 10 MG tablet Take 5 mg by mouth at bedtime as needed for sleep.   Yes Historical Provider, MD    Allergies as of 10/20/2014 - Review Complete 08/07/2014  Allergen Reaction Noted  . Ciprofloxacin  08/07/2014  . Sitagliptin  08/07/2014    Family History  Problem Relation Age of Onset  . Family history unknown: Yes    Social History   Social History  . Marital Status: Married    Spouse Name: N/A  . Number of Children: N/A  . Years of Education: N/A   Occupational History  . Not on file.   Social History Main Topics  . Smoking status: Unknown If Ever Smoked  . Smokeless tobacco: Not on file  . Alcohol Use: No  . Drug Use: No  . Sexual Activity: Not on file   Other Topics Concern  . Not on file   Social History Narrative    Review of Systems: See HPI, otherwise negative ROS  Physical Exam: BP 149/81 mmHg  Pulse 64  Temp(Src) 97.8 F (36.6 C)  Resp 16  Ht 5' 8.5" (1.74 m)  Wt 97.977 kg  (216 lb)  BMI 32.36 kg/m2  SpO2 99% General:   Alert,  pleasant and cooperative in NAD Head:  Normocephalic and atraumatic. Neck:  Supple; no masses or thyromegaly. Lungs:  Clear throughout to auscultation.    Heart:  Regular rate and rhythm. 1-2/6 SEM Abdomen:  Soft, nontender and nondistended. Normal bowel sounds, without guarding, and without rebound.   Neurologic:  Alert and  oriented x4;  grossly normal neurologically.  Impression/Plan: Todd Valentine is here for an colonoscopy to be performed for [facility] colon polyps  Risks, benefits, limitations, and alternatives regarding  colonoscopy have been reviewed with the patient.  Questions have been answered.  All parties agreeable.   Gaylyn Cheers, MD  11/23/2014, 9:05 AM

## 2015-01-11 ENCOUNTER — Other Ambulatory Visit: Payer: Self-pay | Admitting: Cardiovascular Disease

## 2015-01-29 ENCOUNTER — Other Ambulatory Visit: Payer: Self-pay | Admitting: Cardiovascular Disease

## 2015-02-07 ENCOUNTER — Other Ambulatory Visit: Payer: Self-pay | Admitting: Cardiovascular Disease

## 2015-02-14 ENCOUNTER — Other Ambulatory Visit: Payer: Self-pay | Admitting: Cardiovascular Disease

## 2015-04-12 ENCOUNTER — Other Ambulatory Visit: Payer: Self-pay | Admitting: Cardiovascular Disease

## 2015-06-18 ENCOUNTER — Other Ambulatory Visit: Payer: Self-pay | Admitting: Cardiovascular Disease

## 2015-08-10 ENCOUNTER — Ambulatory Visit (INDEPENDENT_AMBULATORY_CARE_PROVIDER_SITE_OTHER): Payer: Medicare Other | Admitting: Cardiovascular Disease

## 2015-08-10 ENCOUNTER — Encounter: Payer: Self-pay | Admitting: Cardiovascular Disease

## 2015-08-10 VITALS — BP 120/70 | HR 60 | Ht 69.0 in | Wt 225.0 lb

## 2015-08-10 DIAGNOSIS — E785 Hyperlipidemia, unspecified: Secondary | ICD-10-CM

## 2015-08-10 DIAGNOSIS — I2510 Atherosclerotic heart disease of native coronary artery without angina pectoris: Secondary | ICD-10-CM | POA: Diagnosis not present

## 2015-08-10 DIAGNOSIS — I4891 Unspecified atrial fibrillation: Secondary | ICD-10-CM | POA: Diagnosis not present

## 2015-08-10 DIAGNOSIS — E118 Type 2 diabetes mellitus with unspecified complications: Secondary | ICD-10-CM

## 2015-08-10 DIAGNOSIS — I5033 Acute on chronic diastolic (congestive) heart failure: Secondary | ICD-10-CM | POA: Diagnosis not present

## 2015-08-10 NOTE — Progress Notes (Signed)
Patient ID: Todd Valentine, male   DOB: 1940/09/10, 75 y.o.   MRN: [id number] Cardiology Office Note  Date:  08/10/2015   ID:  Todd Valentine, DOB Sep 05, 1940, MRN [id number]  PCP:  [name], MD   Chief Complaint  Patient presents with  . other    1 yr f/u afib. Meds reviewed verbally with pt.    HPI:  [name] is a very pleasant 75 year old gentleman with long history of smoking for 30-40 years, some alcohol use, obesity, chronic diastolic CHF, chronic atrial fibrillation, hypertension, type 2 diabetes, presenting for follow-up of his atrial fibrillation Significant coronary disease by catheterization August 2015 as detailed below, three-vessel disease  In follow-up, he reports that he has significant fatigue during the daytime He has noticed low blood pressure and heart rate Sometimes with mild orthostasis symptoms On his prior clinic visit he had similar symptoms of fatigue in the late morning after taking his medication  No significant leg edema, no abdominal swelling, currently on Lasix Denies having any complications on the pradaxa Weight up 9 pounds from his prior clinic visit Most recent total cholesterol well controlled, 130 or less  EKG on today's visit shows atrial fibrillation with rate 60 bpm, right bundle branch block  Other past medical history hospital admission 10/02/2013 with discharge August 12. He presented with shortness of breath, Atrial fibrillation with RVR. Echocardiogram showed normal ejection fraction. Symptoms improved with diuresis.  Echocardiogram 10/02/2013 showing ejection fraction 55-60%, mildly dilated left atrium, mild to moderate MR and TR  He had cardiac catheterization 10/06/2013 showing occluded diagonal vessel, 40% proximal LAD disease, bifurcating marginal branch/ramus. Ramus had 60% followed by 80% disease, OM with 50% mid disease at least. RCA with 40% mid disease. Medical management recommended   hemoglobin A1c 7.1  up from 6.9 Previously was on simvastatin. Currently not on simvastatin for uncertain reasons. On the simvastatin, total cholesterol was 139, LDL 50  Prior weight at the time of hospital discharge was 218 pounds, now he reports 228 pounds at home, 230 pounds in our clinic  PMH:   has a past medical history of CHF (congestive heart failure) ([place]); A-fib ([place]); Acute renal failure ([place]); Systolic CHF ([place]); Hyperkalemia; Hyperlipidemia; Diabetes mellitus without complication ([place]); Coronary artery disease (10/06/2013); Hypertension; BPH (benign prostatic hyperplasia); History of diverticulitis; Systolic murmur; Depression; Malignant melanoma ([place]); and Chronic insomnia.  PSH:    Past Surgical History  Procedure Laterality Date  . Cardiac catheterization  10/06/2013  . Mohs surgery    . Appendectomy    . Removal tubular adenoma    . Colonoscopy    . Colonoscopy with propofol N/A 11/23/2014    Procedure: COLONOSCOPY WITH PROPOFOL;  Surgeon: Manya Silvas, MD;  Location: [facility] ENDOSCOPY;  Service: Endoscopy;  Laterality: N/A;    Current Outpatient Prescriptions  Medication Sig Dispense Refill  . aspirin 81 MG tablet Take 81 mg by mouth daily.    . carvedilol (COREG) 25 MG tablet TAKE ONE TABLET BY MOUTH TWICE DAILY WITH MEALS 60 tablet 6  . diltiazem (DILACOR XR) 120 MG 24 hr capsule TAKE ONE CAPSULE BY MOUTH ONCE DAILY 90 capsule 3  . furosemide (LASIX) 20 MG tablet TAKE ONE TABLET BY MOUTH TWICE DAILY AS NEEDED 180 tablet 3  . glimepiride (AMARYL) 4 MG tablet Take 4 mg by mouth daily with breakfast.    . hydrALAZINE (APRESOLINE) 25 MG tablet Take 1 tablet (25 mg total) by mouth 3 (three) times daily. 90 tablet  3  . HYDROcodone-acetaminophen (NORCO/VICODIN) 5-325 MG per tablet Take 1 tablet by mouth every 6 (six) hours as needed.     [name] lisinopril-hydrochlorothiazide (PRINZIDE,ZESTORETIC) 20-12.5 MG per tablet Take 1 tablet by mouth daily. 30 tablet 6  . magnesium oxide (MAG-OX) 400 MG  tablet Take 400 mg by mouth daily.    . metformin (FORTAMET) 1000 MG (OSM) 24 hr tablet Take 1,000 mg by mouth 2 (two) times daily with a meal.     . nitroGLYCERIN (NITROSTAT) 0.4 MG SL tablet Place 1 tablet (0.4 mg total) under the tongue every 5 (five) minutes as needed for chest pain. 25 tablet 3  . OMEGA-3 FATTY ACIDS PO Take 1 tablet by mouth twice a day.    . potassium chloride (K-DUR) 10 MEQ tablet TAKE ONE TABLET BY MOUTH TWICE DAILY AS  NEEDED 180 tablet 3  . PRADAXA 150 MG CAPS capsule TAKE ONE CAPSULE BY MOUTH TWICE DAILY 60 capsule 3  . simvastatin (ZOCOR) 40 MG tablet TAKE ONE TABLET BY MOUTH AT BEDTIME 90 tablet 3  . tamsulosin (FLOMAX) 0.4 MG CAPS capsule Take 0.4 mg by mouth daily.     . traZODone (DESYREL) 50 MG tablet Take 50 mg by mouth at bedtime as needed for sleep.     No current facility-administered medications for this visit.     Allergies:   Ciprofloxacin and Sitagliptin   Social History:  The patient  reports that he drinks alcohol. He reports that he does not use illicit drugs.   Family History:   Family history is unknown by patient.    Review of Systems: Review of Systems  Constitutional: Positive for malaise/fatigue.  Respiratory: Negative.   Cardiovascular: Negative.   Gastrointestinal: Negative.   Musculoskeletal: Negative.   Neurological: Negative.   Psychiatric/Behavioral: Negative.   All other systems reviewed and are negative.    PHYSICAL EXAM: VS:  BP 120/70 mmHg  Pulse 60  Ht 5\' 9"  (1.753 m)  Wt 225 lb (102.059 kg)  BMI 33.21 kg/m2 , BMI Body mass index is 33.21 kg/(m^2). GEN: Well nourished, well developed, in no acute distress, obese HEENT: normal Neck: no JVD, carotid bruits, or masses Cardiac: Irregular rate and rhythm, no murmurs, rubs, or gallops,no edema  Respiratory:  clear to auscultation bilaterally, normal work of breathing GI: soft, nontender, nondistended, + BS MS: no deformity or atrophy Skin: warm and dry, no  rash Neuro:  Strength and sensation are intact Psych: euthymic mood, full affect    Recent Labs:  Lipid Panel Lab Results  Component Value Date   CHOL 119 02/28/2014   HDL 44 02/28/2014   LDLCALC 55 02/28/2014   TRIG 99 02/28/2014      Wt Readings from Last 3 Encounters:  08/10/15 225 lb (102.059 kg)  11/23/14 216 lb (97.977 kg)  08/07/14 225 lb 8 oz (102.286 kg)       ASSESSMENT AND PLAN:  Atrial fibrillation, unspecified - Plan: EKG 12-Lead Rate relatively well-controlled, given his fatigue we will decrease the carvedilol as detailed below  Acute on chronic diastolic CHF (congestive heart failure) ([name]) Appears relatively euvolemic, will continue on his current Lasix regiment Given excessive fatigue, we will hold hydralazine, decrease carvedilol down to 12.5 mill grams twice a day  Coronary artery disease involving native coronary artery of native heart without angina pectoris Currently with no symptoms of angina. No further workup at this time. Continue current medication regimen.  Hyperlipidemia Cholesterol is at goal on the current lipid regimen.  No changes to the medications were made.  Morbid obesity due to excess calories ([place]) We have encouraged continued exercise, careful diet management in an effort to lose weight.  Type 2 diabetes mellitus with complication, without long-term current use of insulin ([name]) Encouraged weight loss, low carbohydrate diet   Total encounter time more than 25 minutes  Greater than 50% was spent in counseling and coordination of care with the patient   Disposition:   F/U  6 months   Orders Placed This Encounter  Procedures  . EKG 12-Lead     Signed, Esmond Plants, M.D., Ph.D. 08/10/2015  [place], [place]

## 2015-08-10 NOTE — Patient Instructions (Addendum)
You are doing well.  Please stop the hydralazine Please cut the coreg/carvedilol in 1/2 daily If continue to feel draggy, call the office   Please call us if you have new issues that need to be addressed before your next appt.  Your physician wants you to follow-up in: 12 months.  You will receive a reminder letter in the mail two months in advance. If you don't receive a letter, please call our office to schedule the follow-up appointment.

## 2015-09-11 ENCOUNTER — Other Ambulatory Visit: Payer: Self-pay | Admitting: *Deleted

## 2015-09-11 MED ORDER — CARVEDILOL 25 MG PO TABS: 25.0000 mg | ORAL_TABLET | Freq: Two times a day (BID) | ORAL | Status: DC

## 2015-09-11 NOTE — Telephone Encounter (Signed)
Requested Prescriptions   Signed Prescriptions Disp Refills  . carvedilol (COREG) 25 MG tablet 60 tablet 3    Sig: Take 1 tablet (25 mg total) by mouth 2 (two) times daily with a meal.    Authorizing Provider: Minna Merritts    Ordering User: Britt Bottom

## 2015-10-30 ENCOUNTER — Telehealth: Payer: Self-pay | Admitting: Cardiovascular Disease

## 2015-10-30 NOTE — Telephone Encounter (Signed)
Pt needs to discuss him medications. States he still feels like "i'm still walking around in a drum". He is not sure of the name of the medication, it is the one he cuts in half. Please call.

## 2015-10-30 NOTE — Telephone Encounter (Signed)
Pt was advised at ov 08/10/15:  "Please stop the hydralazine Please cut the coreg/carvedilol in 1/2 daily If continue to feel draggy, call the office "  Pt is still not feeling well and would like to know if Dr. Rockey Situ has any other recommendations.

## 2015-10-31 ENCOUNTER — Encounter: Payer: Self-pay | Admitting: Cardiovascular Disease

## 2015-10-31 ENCOUNTER — Ambulatory Visit (INDEPENDENT_AMBULATORY_CARE_PROVIDER_SITE_OTHER): Payer: Medicare Other | Admitting: Cardiovascular Disease

## 2015-10-31 VITALS — BP 130/80 | HR 56 | Ht 68.0 in | Wt 223.5 lb

## 2015-10-31 DIAGNOSIS — I4891 Unspecified atrial fibrillation: Secondary | ICD-10-CM

## 2015-10-31 DIAGNOSIS — I5033 Acute on chronic diastolic (congestive) heart failure: Secondary | ICD-10-CM | POA: Diagnosis not present

## 2015-10-31 DIAGNOSIS — E118 Type 2 diabetes mellitus with unspecified complications: Secondary | ICD-10-CM

## 2015-10-31 DIAGNOSIS — I2510 Atherosclerotic heart disease of native coronary artery without angina pectoris: Secondary | ICD-10-CM | POA: Diagnosis not present

## 2015-10-31 DIAGNOSIS — E785 Hyperlipidemia, unspecified: Secondary | ICD-10-CM | POA: Diagnosis not present

## 2015-10-31 MED ORDER — CARVEDILOL 3.125 MG PO TABS: 3.1250 mg | ORAL_TABLET | Freq: Two times a day (BID) | ORAL | 3 refills | Status: DC

## 2015-10-31 NOTE — Progress Notes (Signed)
Cardiology Office Note  Date:  10/31/2015   ID:  WESS SULEIMAN, DOB 1941/01/29, MRN [id number]  PCP:  [name], MD   Chief Complaint  Patient presents with  . Other    C/o fatigue. Meds reviewed verbally with pt.    HPI:  [name] is a very pleasant 75 year old gentleman with long history of smoking for 30-40 years, some alcohol use, obesity, chronic diastolic CHF, chronic atrial fibrillation, hypertension, type 2 diabetes, presenting for follow-up of his atrial fibrillation Significant coronary disease by catheterization August 2015 as detailed below, three-vessel disease He does have sleep apnea, uses his CPAP periodically  In follow-up today, he reports that he feels somewhat better after recent medication changes but still fatigue. Heart rate is running low On his last clinic visit we held the hydralazine, decreased Coreg down to 12.5 mill grams twice a day Reports having heart rate still in the 50s Less orthostasis symptoms Sometimes not compliant with his CPAP No regular exercise program, works part-time for [employer]  No significant leg edema, no abdominal swelling, currently on Lasix 20 mg twice a day, also takes lisinopril HCT. Creatinine and BUN borderline elevated  Denies having any complications on the pradaxa  Most recent total cholesterol well controlled, 130 or less  EKG on today's visit shows atrial fibrillation with rate 56 bpm, right bundle branch block  Other past medical history hospital admission 10/02/2013 with discharge August 12. He presented with shortness of breath, Atrial fibrillation with RVR. Echocardiogram showed normal ejection fraction. Symptoms improved with diuresis.  Echocardiogram 10/02/2013 showing ejection fraction 55-60%, mildly dilated left atrium, mild to moderate MR and TR  He had cardiac catheterization 10/06/2013 showing occluded diagonal vessel, 40% proximal LAD disease, bifurcating marginal  branch/ramus. Ramus had 60% followed by 80% disease, OM with 50% mid disease at least. RCA with 40% mid disease. Medical management recommended   hemoglobin A1c 7.1 up from 6.9 Previously was on simvastatin. Currently not on simvastatin for uncertain reasons. On the simvastatin, total cholesterol was 139, LDL 50  Prior weight at the time of hospital discharge was 218 pounds, now he reports 228 pounds at home, 230 pounds in our clinic  PMH:   has a past medical history of A-fib ([place]); Acute renal failure ([name]); BPH (benign prostatic hyperplasia); CHF (congestive heart failure) ([place]); Chronic insomnia; Coronary artery disease (10/06/2013); Depression; Diabetes mellitus without complication ([place]); History of diverticulitis; Hyperkalemia; Hyperlipidemia; Hypertension; Malignant melanoma ([place]); Systolic CHF ([place]); and Systolic murmur.  PSH:    Past Surgical History:  Procedure Laterality Date  . APPENDECTOMY    . CARDIAC CATHETERIZATION  10/06/2013  . COLONOSCOPY    . COLONOSCOPY WITH PROPOFOL N/A 11/23/2014   Procedure: COLONOSCOPY WITH PROPOFOL;  Surgeon: Manya Silvas, MD;  [facility] ENDOSCOPY;  Service: Endoscopy;  Laterality: N/A;  . MOHS SURGERY    . removal tubular adenoma      Current Outpatient Prescriptions  Medication Sig Dispense Refill  . aspirin 81 MG tablet Take 81 mg by mouth daily.    . carvedilol (COREG) 3.125 MG tablet Take 1 tablet (3.125 mg total) by mouth 2 (two) times daily with a meal. 180 tablet 3  . diltiazem (DILACOR XR) 120 MG 24 hr capsule TAKE ONE CAPSULE BY MOUTH ONCE DAILY 90 capsule 3  . furosemide (LASIX) 20 MG tablet TAKE ONE TABLET BY MOUTH TWICE DAILY AS NEEDED 180 tablet 3  . glimepiride (AMARYL) 4 MG tablet Take 4 mg by mouth daily  with breakfast.    . HYDROcodone-acetaminophen (NORCO/VICODIN) 5-325 MG per tablet Take 1 tablet by mouth every 6 (six) hours as needed.     [name] lisinopril-hydrochlorothiazide (PRINZIDE,ZESTORETIC) 20-12.5 MG per tablet  Take 1 tablet by mouth daily. 30 tablet 6  . magnesium oxide (MAG-OX) 400 MG tablet Take 400 mg by mouth daily.    . metformin (FORTAMET) 1000 MG (OSM) 24 hr tablet Take 1,000 mg by mouth 2 (two) times daily with a meal.     . nitroGLYCERIN (NITROSTAT) 0.4 MG SL tablet Place 1 tablet (0.4 mg total) under the tongue every 5 (five) minutes as needed for chest pain. 25 tablet 3  . OMEGA-3 FATTY ACIDS PO Take 1 tablet by mouth twice a day.    . potassium chloride (K-DUR) 10 MEQ tablet TAKE ONE TABLET BY MOUTH TWICE DAILY AS  NEEDED 180 tablet 3  . PRADAXA 150 MG CAPS capsule TAKE ONE CAPSULE BY MOUTH TWICE DAILY 60 capsule 3  . simvastatin (ZOCOR) 40 MG tablet TAKE ONE TABLET BY MOUTH AT BEDTIME 90 tablet 3  . tamsulosin (FLOMAX) 0.4 MG CAPS capsule Take 0.4 mg by mouth daily.     . traZODone (DESYREL) 50 MG tablet Take 50 mg by mouth at bedtime as needed for sleep.     No current facility-administered medications for this visit.      Allergies:   Ciprofloxacin and Sitagliptin   Social History:  The patient  has an unknown smoking status. He has never used smokeless tobacco. He reports that he drinks alcohol. He reports that he does not use drugs.   Family History:   Family history is unknown by patient.    Review of Systems: Review of Systems  Constitutional: Positive for malaise/fatigue.  Respiratory: Negative.   Cardiovascular: Negative.   Gastrointestinal: Negative.   Musculoskeletal: Negative.   Neurological: Negative.   Psychiatric/Behavioral: Negative.   All other systems reviewed and are negative.    PHYSICAL EXAM: VS:  BP 130/80 (BP Location: Left Arm, Patient Position: Sitting, Cuff Size: Normal)   Pulse (!) 56   Ht 5\' 8"  (1.727 m)   Wt 223 lb 8 oz (101.4 kg)   BMI 33.98 kg/m  , BMI Body mass index is 33.98 kg/m. GEN: Well nourished, well developed, in no acute distress  HEENT: normal  Neck: no JVD, carotid bruits, or masses Cardiac: RRR; no murmurs, rubs, or  gallops,no edema  Respiratory:  clear to auscultation bilaterally, normal work of breathing GI: soft, nontender, nondistended, + BS MS: no deformity or atrophy  Skin: warm and dry, no rash Neuro:  Strength and sensation are intact Psych: euthymic mood, full affect    Recent Labs: No results found for requested labs within last 8760 hours.    Lipid Panel Lab Results  Component Value Date   CHOL 119 02/28/2014   HDL 44 02/28/2014   LDLCALC 55 02/28/2014   TRIG 99 02/28/2014      Wt Readings from Last 3 Encounters:  10/31/15 223 lb 8 oz (101.4 kg)  08/10/15 225 lb (102.1 kg)  11/23/14 216 lb (98 kg)       ASSESSMENT AND PLAN:  Atrial fibrillation, unspecified - Plan: EKG 12-Lead Rate relatively well-controlled, tolerating anticoagulation Given his fatigue, we'll decrease carvedilol down to 3.125 mg twice a day, continue diltiazem  Acute on chronic diastolic CHF (congestive heart failure) ([place]) Appears relatively euvolemic. BUN and creatinine borderline elevated Suggested he try to hold dose of Lasix here  Coronary artery disease involving native coronary artery of native heart without angina pectoris Currently with no symptoms of angina. No further workup at this time. Continue current medication regimen.  Hyperlipidemia Cholesterol is at goal on the current lipid regimen. No changes to the medications were made.  Type 2 diabetes mellitus with complication, without long-term current use of insulin ([name]) Recent climb and A1c. Recommended low carbohydrate diet   Total encounter time more than 25 minutes  Greater than 50% was spent in counseling and coordination of care with the patient    Disposition:   F/U  6 months   Orders Placed This Encounter  Procedures  . EKG 12-Lead     Signed, Esmond Plants, M.D., Ph.D. 10/31/2015  [place], [place]

## 2015-10-31 NOTE — Telephone Encounter (Signed)
Pt sched to see Dr. Rockey Situ today @ 11:20.

## 2015-10-31 NOTE — Patient Instructions (Signed)
Medication Instructions:   Please decrease the coreg down to 3.125 mg twice a day  Monitor heart rate If it continues to ruin low and you have fatigue, We could consider decreasing the coreg down to one a day  Labwork:  No new labs needed  Testing/Procedures:  No further testing at this time   Follow-Up: It was a pleasure seeing you in the office today. Please call us if you have new issues that need to be addressed before your next appt.  [phone]  Your physician wants you to follow-up in: 6 months.  You will receive a reminder letter in the mail two months in advance. If you don't receive a letter, please call our office to schedule the follow-up appointment.  If you need a refill on your cardiac medications before your next appointment, please call your pharmacy.

## 2015-11-19 ENCOUNTER — Other Ambulatory Visit: Payer: Self-pay | Admitting: Cardiovascular Disease

## 2015-11-20 ENCOUNTER — Telehealth: Payer: Self-pay | Admitting: Cardiovascular Disease

## 2015-11-20 NOTE — Telephone Encounter (Signed)
Pt is calling with a report on the change of his medications. He states "it took care of the situation". He staets he is doing well.

## 2016-02-19 ENCOUNTER — Other Ambulatory Visit: Payer: Self-pay | Admitting: Cardiovascular Disease

## 2016-02-29 DIAGNOSIS — M9903 Segmental and somatic dysfunction of lumbar region: Secondary | ICD-10-CM | POA: Diagnosis not present

## 2016-02-29 DIAGNOSIS — M9902 Segmental and somatic dysfunction of thoracic region: Secondary | ICD-10-CM | POA: Diagnosis not present

## 2016-02-29 DIAGNOSIS — M5442 Lumbago with sciatica, left side: Secondary | ICD-10-CM | POA: Diagnosis not present

## 2016-02-29 DIAGNOSIS — M9904 Segmental and somatic dysfunction of sacral region: Secondary | ICD-10-CM | POA: Diagnosis not present

## 2016-03-19 DIAGNOSIS — E119 Type 2 diabetes mellitus without complications: Secondary | ICD-10-CM | POA: Diagnosis not present

## 2016-03-19 DIAGNOSIS — E1121 Type 2 diabetes mellitus with diabetic nephropathy: Secondary | ICD-10-CM | POA: Diagnosis not present

## 2016-03-19 DIAGNOSIS — E782 Mixed hyperlipidemia: Secondary | ICD-10-CM | POA: Diagnosis not present

## 2016-03-19 DIAGNOSIS — I10 Essential (primary) hypertension: Secondary | ICD-10-CM | POA: Diagnosis not present

## 2016-03-26 DIAGNOSIS — C44329 Squamous cell carcinoma of skin of other parts of face: Secondary | ICD-10-CM | POA: Diagnosis not present

## 2016-03-27 ENCOUNTER — Other Ambulatory Visit: Payer: Self-pay | Admitting: Cardiovascular Disease

## 2016-04-02 ENCOUNTER — Other Ambulatory Visit: Payer: Self-pay | Admitting: Cardiovascular Disease

## 2016-04-18 DIAGNOSIS — C4442 Squamous cell carcinoma of skin of scalp and neck: Secondary | ICD-10-CM | POA: Diagnosis not present

## 2016-04-18 DIAGNOSIS — L905 Scar conditions and fibrosis of skin: Secondary | ICD-10-CM | POA: Diagnosis not present

## 2016-04-18 DIAGNOSIS — D044 Carcinoma in situ of skin of scalp and neck: Secondary | ICD-10-CM | POA: Diagnosis not present

## 2016-04-18 DIAGNOSIS — C44612 Basal cell carcinoma of skin of right upper limb, including shoulder: Secondary | ICD-10-CM | POA: Diagnosis not present

## 2016-04-18 DIAGNOSIS — C44519 Basal cell carcinoma of skin of other part of trunk: Secondary | ICD-10-CM | POA: Diagnosis not present

## 2016-04-25 ENCOUNTER — Ambulatory Visit (INDEPENDENT_AMBULATORY_CARE_PROVIDER_SITE_OTHER): Payer: Medicare Other | Admitting: Cardiovascular Disease

## 2016-04-25 ENCOUNTER — Encounter: Payer: Self-pay | Admitting: Cardiovascular Disease

## 2016-04-25 VITALS — BP 150/88 | HR 82 | Ht 69.0 in | Wt 217.0 lb

## 2016-04-25 DIAGNOSIS — E118 Type 2 diabetes mellitus with unspecified complications: Secondary | ICD-10-CM

## 2016-04-25 DIAGNOSIS — I2510 Atherosclerotic heart disease of native coronary artery without angina pectoris: Secondary | ICD-10-CM

## 2016-04-25 DIAGNOSIS — I10 Essential (primary) hypertension: Secondary | ICD-10-CM

## 2016-04-25 DIAGNOSIS — M9903 Segmental and somatic dysfunction of lumbar region: Secondary | ICD-10-CM | POA: Diagnosis not present

## 2016-04-25 DIAGNOSIS — I5033 Acute on chronic diastolic (congestive) heart failure: Secondary | ICD-10-CM | POA: Diagnosis not present

## 2016-04-25 DIAGNOSIS — R0602 Shortness of breath: Secondary | ICD-10-CM

## 2016-04-25 DIAGNOSIS — M9902 Segmental and somatic dysfunction of thoracic region: Secondary | ICD-10-CM | POA: Diagnosis not present

## 2016-04-25 DIAGNOSIS — I482 Chronic atrial fibrillation, unspecified: Secondary | ICD-10-CM

## 2016-04-25 DIAGNOSIS — E782 Mixed hyperlipidemia: Secondary | ICD-10-CM | POA: Diagnosis not present

## 2016-04-25 DIAGNOSIS — M9904 Segmental and somatic dysfunction of sacral region: Secondary | ICD-10-CM | POA: Diagnosis not present

## 2016-04-25 MED ORDER — APIXABAN 5 MG PO TABS: 5.0000 mg | ORAL_TABLET | Freq: Two times a day (BID) | ORAL | 11 refills | Status: DC

## 2016-04-25 MED ORDER — CARVEDILOL 6.25 MG PO TABS: 6.2500 mg | ORAL_TABLET | Freq: Two times a day (BID) | ORAL | 3 refills | Status: DC

## 2016-04-25 NOTE — Patient Instructions (Addendum)
Medication Instructions:   Please increase the coreg up to 6.25 mg twice a day  Monitor blood pressure If blood pressure runs high >140, Then increase the lisinopril HCTZ up to two a day  Switch from pradaxa to eliquis 5 mg twice a day  Labwork:  No new labs needed  Testing/Procedures:  No further testing at this time   I recommend watching educational videos on topics of interest to you at:       www.goemmi.com  Enter code: [email or web address]    Follow-Up: It was a pleasure seeing you in the office today. Please call us if you have new issues that need to be addressed before your next appt.  [phone]  Your physician wants you to follow-up in: 6 months.  You will receive a reminder letter in the mail two months in advance. If you don't receive a letter, please call our office to schedule the follow-up appointment.  If you need a refill on your cardiac medications before your next appointment, please call your pharmacy.

## 2016-04-25 NOTE — Progress Notes (Signed)
Cardiology Office Note  Date:  04/25/2016   ID:  Todd Valentine, DOB 04/19/1940, MRN [id number]  PCP:  [name], MD   Chief Complaint  Patient presents with  . other    6 month follow up. Meds reviewed by the pt. verbally. "doing well."     HPI:  Todd Valentine is a very pleasant 76 year old gentleman with long history of smoking for 30-40 years, some alcohol use, obesity, chronic diastolic CHF, chronic atrial fibrillation, hypertension, type 2 diabetes, presenting for follow-up of his atrial fibrillation Significant coronary disease by catheterization August 2015 as detailed below, three-vessel disease He does have sleep apnea, uses his CPAP periodically  Very sad on today's visit, wife recently passed away. 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 No regular exercise program On previous office visit we decreased the carvedilol from 25 down to 12 and finally down to 3 mg twice a day for fatigue and bradycardia Previously held his hydralazine for orthostasis  He reports he is taking lisinopril HCTZ once a day, not twice a day as previously prescribed  No significant leg edema, no abdominal swelling,  currently on Lasix 20 mg twice a day  Most recent total cholesterol well controlled, 130 or less  EKG on today's visit shows atrial fibrillation with rate 82 bpm, right bundle branch block  Other past medical history hospital admission 10/02/2013 with discharge August 12. He presented with shortness of breath, Atrial fibrillation with RVR. Echocardiogram showed normal ejection fraction. Symptoms improved with diuresis.  Echocardiogram 10/02/2013 showing ejection fraction 55-60%, mildly dilated left atrium, mild to moderate MR and TR  He had cardiac catheterization 10/06/2013 showing occluded diagonal vessel, 40%  proximal LAD disease, bifurcating marginal branch/ramus. Ramus had 60% followed by 80% disease, OM with 50% mid disease at least. RCA with 40% mid disease. Medical management recommended  hemoglobin A1c 7.1 up from 6.9 Previously was on simvastatin. Currently not on simvastatin for uncertain reasons. On the simvastatin, total cholesterol was 139, LDL 50  Prior weight at the time of hospital discharge was 218 pounds, now he reports 228 pounds at home, 230 pounds in our clinic  PMH:   has a past medical history of A-fib ([place]); Acute renal failure ([name]); BPH (benign prostatic hyperplasia); CHF (congestive heart failure) ([place]); Chronic insomnia; Coronary artery disease (10/06/2013); Depression; Diabetes mellitus without complication ([place]); History of diverticulitis; Hyperkalemia; Hyperlipidemia; Hypertension; Malignant melanoma ([place]); Systolic CHF ([place]); and Systolic murmur.  PSH:    Past Surgical History:  Procedure Laterality Date  . APPENDECTOMY    . CARDIAC CATHETERIZATION  10/06/2013  . COLONOSCOPY    . COLONOSCOPY WITH PROPOFOL N/A 11/23/2014   Procedure: COLONOSCOPY WITH PROPOFOL;  Surgeon: Manya Silvas, MD;  Location[facility] ENDOSCOPY;  Service: Endoscopy;  Laterality: N/A;  . MOHS SURGERY    . removal tubular adenoma      Current Outpatient Prescriptions  Medication Sig Dispense Refill  . carvedilol (COREG) 6.25 MG tablet Take 1 tablet (6.25 mg total) by mouth 2 (two) times daily with a meal. 180 tablet 3  . diltiazem (DILACOR XR) 120 MG 24 hr capsule TAKE ONE CAPSULE BY MOUTH ONCE DAILY 90 capsule 3  . furosemide (LASIX) 20 MG tablet TAKE ONE TABLET BY MOUTH TWICE DAILY AS NEEDED 180 tablet 3  . glimepiride (AMARYL) 4 MG tablet Take 4  mg by mouth 2 (two) times daily.     [name] HYDROcodone-acetaminophen (NORCO/VICODIN) 5-325 MG per tablet Take 1 tablet by mouth every 6 (six) hours as needed.     [name] lisinopril-hydrochlorothiazide (PRINZIDE,ZESTORETIC) 20-12.5 MG per tablet Take 1  tablet by mouth daily. 30 tablet 6  . magnesium oxide (MAG-OX) 400 MG tablet Take 400 mg by mouth daily.    . metformin (FORTAMET) 1000 MG (OSM) 24 hr tablet Take 1,000 mg by mouth 2 (two) times daily with a meal.     . nitroGLYCERIN (NITROSTAT) 0.4 MG SL tablet Place 1 tablet (0.4 mg total) under the tongue every 5 (five) minutes as needed for chest pain. 25 tablet 3  . potassium chloride (K-DUR) 10 MEQ tablet TAKE ONE TABLET BY MOUTH TWICE DAILY AS NEEDED 180 tablet 3  . PRADAXA 150 MG CAPS capsule TAKE ONE CAPSULE BY MOUTH TWICE DAILY 180 capsule 3  . simvastatin (ZOCOR) 40 MG tablet TAKE ONE TABLET BY MOUTH AT BEDTIME 90 tablet 3  . tamsulosin (FLOMAX) 0.4 MG CAPS capsule Take 0.4 mg by mouth daily.     . traZODone (DESYREL) 50 MG tablet Take 50 mg by mouth at bedtime as needed for sleep.    [name] apixaban (ELIQUIS) 5 MG TABS tablet Take 1 tablet (5 mg total) by mouth 2 (two) times daily. 60 tablet 11   No current facility-administered medications for this visit.      Allergies:   Ciprofloxacin and Sitagliptin   Social History:  The patient  has an unknown smoking status. He has never used smokeless tobacco. He reports that he drinks alcohol. He reports that he does not use drugs.   Family History:   Family history is unknown by patient.    Review of Systems: Review of Systems  Constitutional: Negative.   Respiratory: Negative.   Cardiovascular: Negative.   Gastrointestinal: Negative.   Musculoskeletal: Negative.   Neurological: Negative.   Psychiatric/Behavioral: Negative.   All other systems reviewed and are negative.    PHYSICAL EXAM: VS:  BP (!) 150/88 (BP Location: Left Arm, Patient Position: Sitting)   Pulse 82   Ht 5\' 9"  (1.753 m)   Wt 217 lb (98.4 kg)   BMI 32.05 kg/m  , BMI Body mass index is 32.05 kg/m. GEN: Well nourished, well developed, in no acute distress , obese HEENT: normal  Neck: no JVD, carotid bruits, or masses Cardiac: Irregularly irregular; no  murmurs, rubs, or gallops,no edema  Respiratory:  clear to auscultation bilaterally, normal work of breathing GI: soft, nontender, nondistended, + BS MS: no deformity or atrophy  Skin: warm and dry, no rash Neuro:  Strength and sensation are intact Psych: euthymic mood, full affect    Recent Labs: No results found for requested labs within last 8760 hours.    Lipid Panel Lab Results  Component Value Date   CHOL 119 02/28/2014   HDL 44 02/28/2014   LDLCALC 55 02/28/2014   TRIG 99 02/28/2014      Wt Readings from Last 3 Encounters:  04/25/16 217 lb (98.4 kg)  10/31/15 223 lb 8 oz (101.4 kg)  08/10/15 225 lb (102.1 kg)       ASSESSMENT AND PLAN:  Chronic atrial fibrillation ([place]) - Plan: EKG 12-Lead Rate relatively well-controlled but we will increase carvedilol up to 6.25 mill grams twice a day Discussed various types of anticoagulation with him We will change from pradaxa 5 mg to eliquis  twice a day  Essential hypertension Recommended  that he increase Coreg up to 6.25 mg twice a day If blood pressure continues to run high, would increase lisinopril back to 40/25 mg daily For some reason he is taking lisinopril 20/12.5 mg daily  Acute on chronic diastolic CHF (congestive heart failure) ([place]) - Plan: EKG 12-Lead  recommended he continue to take Lasix as needed for any ankle swelling or shortness of breath   Coronary artery disease involving native coronary artery of native heart without angina pectoris - Plan: EKG 12-Lead Currently with no symptoms of angina. No further workup at this time. Continue current medication regimen.  Mixed hyperlipidemia - Plan: EKG 12-Lead Cholesterol is at goal on the current lipid regimen. No changes to the medications were made.  Type 2 diabetes mellitus with complication, without long-term current use of insulin ([place]) - Plan: EKG 12-Lead We have encouraged continued exercise, careful diet management in an effort to lose  weight.  Shortness of breath - Plan: EKG 12-Lead  shortness of breath on exertion likely secondary to deconditioning, obesity, unable to exclude mild fluid overload . Suggested he take extra Lasix for any worsening symptoms    Total encounter time more than 25 minutes  Greater than 50% was spent in counseling and coordination of care with the patient   Disposition:   F/U  6 months   Orders Placed This Encounter  Procedures  . EKG 12-Lead     Signed, Esmond Plants, M.D., Ph.D. 04/25/2016  [place], [place]

## 2016-05-07 ENCOUNTER — Ambulatory Visit (INDEPENDENT_AMBULATORY_CARE_PROVIDER_SITE_OTHER)
Admission: EM | Admit: 2016-05-07 | Discharge: 2016-05-07 | Disposition: A | Discharge status: Home / Self Care | Payer: Medicare Other | Source: Home / Self Care | Attending: Internal Medicine | Admitting: Internal Medicine

## 2016-05-07 ENCOUNTER — Encounter: Payer: Self-pay | Admitting: Emergency Medicine

## 2016-05-07 DIAGNOSIS — N400 Enlarged prostate without lower urinary tract symptoms: Secondary | ICD-10-CM | POA: Insufficient documentation

## 2016-05-07 DIAGNOSIS — I110 Hypertensive heart disease with heart failure: Secondary | ICD-10-CM

## 2016-05-07 DIAGNOSIS — Z85820 Personal history of malignant melanoma of skin: Secondary | ICD-10-CM | POA: Insufficient documentation

## 2016-05-07 DIAGNOSIS — N179 Acute kidney failure, unspecified: Secondary | ICD-10-CM | POA: Insufficient documentation

## 2016-05-07 DIAGNOSIS — R1110 Vomiting, unspecified: Secondary | ICD-10-CM | POA: Insufficient documentation

## 2016-05-07 DIAGNOSIS — E785 Hyperlipidemia, unspecified: Secondary | ICD-10-CM | POA: Insufficient documentation

## 2016-05-07 DIAGNOSIS — Z7984 Long term (current) use of oral hypoglycemic drugs: Secondary | ICD-10-CM

## 2016-05-07 DIAGNOSIS — I2510 Atherosclerotic heart disease of native coronary artery without angina pectoris: Secondary | ICD-10-CM | POA: Insufficient documentation

## 2016-05-07 DIAGNOSIS — Z87891 Personal history of nicotine dependence: Secondary | ICD-10-CM | POA: Insufficient documentation

## 2016-05-07 DIAGNOSIS — I482 Chronic atrial fibrillation: Secondary | ICD-10-CM | POA: Insufficient documentation

## 2016-05-07 DIAGNOSIS — E118 Type 2 diabetes mellitus with unspecified complications: Secondary | ICD-10-CM | POA: Insufficient documentation

## 2016-05-07 DIAGNOSIS — I5032 Chronic diastolic (congestive) heart failure: Secondary | ICD-10-CM | POA: Insufficient documentation

## 2016-05-07 DIAGNOSIS — F329 Major depressive disorder, single episode, unspecified: Secondary | ICD-10-CM | POA: Insufficient documentation

## 2016-05-07 DIAGNOSIS — K529 Noninfective gastroenteritis and colitis, unspecified: Secondary | ICD-10-CM

## 2016-05-07 DIAGNOSIS — E875 Hyperkalemia: Secondary | ICD-10-CM | POA: Insufficient documentation

## 2016-05-07 LAB — CBC WITH DIFFERENTIAL/PLATELET
Band Neutrophils: 10 %
Basophils Absolute: 0 10*3/uL (ref 0–0.1)
Basophils Relative: 0 %
Blasts: 0 %
Eosinophils Absolute: 0 10*3/uL (ref 0–0.7)
Eosinophils Relative: 0 %
HCT: 41.4 % (ref 40.0–52.0)
Hemoglobin: 13.9 g/dL (ref 13.0–18.0)
Lymphocytes Relative: 5 %
Lymphs Abs: 0.4 10*3/uL — ABNORMAL LOW (ref 1.0–3.6)
MCH: 29.6 pg (ref 26.0–34.0)
MCHC: 33.6 g/dL (ref 32.0–36.0)
MCV: 88.1 fL (ref 80.0–100.0)
Metamyelocytes Relative: 0 %
Monocytes Absolute: 0.4 10*3/uL (ref 0.2–1.0)
Monocytes Relative: 4 %
Myelocytes: 0 %
Neutro Abs: 8 10*3/uL — ABNORMAL HIGH (ref 1.4–6.5)
Neutrophils Relative %: 81 %
Other: 0 %
Platelets: 111 10*3/uL — ABNORMAL LOW (ref 150–440)
Promyelocytes Absolute: 0 %
RBC: 4.7 MIL/uL (ref 4.40–5.90)
RDW: 13.4 % (ref 11.5–14.5)
WBC: 8.8 10*3/uL (ref 3.8–10.6)
nRBC: 0 /100 WBC

## 2016-05-07 MED ORDER — ONDANSETRON HCL 4 MG PO TABS: 4.0000 mg | ORAL_TABLET | Freq: Three times a day (TID) | ORAL | 0 refills | Status: DC | PRN

## 2016-05-07 MED ORDER — IBUPROFEN 400 MG PO TABS
400.0000 mg | ORAL_TABLET | Freq: Once | ORAL | Status: AC
  Administered 2016-05-07: 400 mg via ORAL

## 2016-05-07 MED ORDER — PROMETHAZINE HCL 25 MG/ML IJ SOLN
25.0000 mg | Freq: Once | INTRAMUSCULAR | Status: AC
  Administered 2016-05-07: 25 mg via INTRAMUSCULAR

## 2016-05-07 NOTE — ED Provider Notes (Signed)
[facility]    CSN: [id number] Arrival date & time: 05/07/16  1949     History   Chief Complaint Chief Complaint  Patient presents with  . Emesis    HPI Todd Valentine is a 76 y.o. male.   Pt with PMH sig for Afib, CHF and HTN presents to clinic c/o subjective fever, nausea and vomiting that began this morning.  He has had 3 episodes of NBNB emesis.  He admits to feeling weak.  Denies chest pain or SOB.      Past Medical History:  Diagnosis Date  . A-fib ([place])   . Acute renal failure ([place])   . BPH (benign prostatic hyperplasia)   . CHF (congestive heart failure) ([place])   . Chronic insomnia   . Coronary artery disease 10/06/2013   [facility]  . Depression   . Diabetes mellitus without complication ([place])   . History of diverticulitis   . Hyperkalemia   . Hyperlipidemia   . Hypertension   . Malignant melanoma ([place])   . Systolic CHF ([place])   . Systolic murmur     Patient Active Problem List   Diagnosis Date Noted  . Chronic atrial fibrillation ([place]) 01/02/2014  . Acute on chronic diastolic CHF (congestive heart failure) ([place]) 01/02/2014  . Morbid obesity ([place]) 01/02/2014  . Coronary artery disease involving native coronary artery of native heart without angina pectoris 01/02/2014  . Bilateral leg edema 01/02/2014  . Diabetes mellitus type 2 with complications ([place]) 53/97/6734  . History of smoking 30 or more pack years 01/02/2014  . Shortness of breath 01/02/2014  . Hyperlipidemia 01/02/2014    Past Surgical History:  Procedure Laterality Date  . APPENDECTOMY    . CARDIAC CATHETERIZATION  10/06/2013  . COLONOSCOPY    . COLONOSCOPY WITH PROPOFOL N/A 11/23/2014   Procedure: COLONOSCOPY WITH PROPOFOL;  Surgeon: Manya Silvas, MD;  Location: [facility] ENDOSCOPY;  Service: Endoscopy;  Laterality: N/A;  . MOHS SURGERY    . removal tubular adenoma         Home Medications    Prior to Admission medications   Medication Sig Start Date End Date Taking?  Authorizing Provider  apixaban (ELIQUIS) 5 MG TABS tablet Take 1 tablet (5 mg total) by mouth 2 (two) times daily. 04/25/16   Minna Merritts, MD  carvedilol (COREG) 6.25 MG tablet Take 1 tablet (6.25 mg total) by mouth 2 (two) times daily with a meal. 04/25/16   Minna Merritts, MD  diltiazem (DILACOR XR) 120 MG 24 hr capsule TAKE ONE CAPSULE BY MOUTH ONCE DAILY 06/19/15   Minna Merritts, MD  furosemide (LASIX) 20 MG tablet TAKE ONE TABLET BY MOUTH TWICE DAILY AS NEEDED 02/19/16   Minna Merritts, MD  glimepiride (AMARYL) 4 MG tablet Take 4 mg by mouth 2 (two) times daily.     Historical Provider, MD  lisinopril-hydrochlorothiazide (PRINZIDE,ZESTORETIC) 20-12.5 MG per tablet Take 1 tablet by mouth daily. 04/24/14   Minna Merritts, MD  magnesium oxide (MAG-OX) 400 MG tablet Take 400 mg by mouth daily.    Historical Provider, MD  metformin (FORTAMET) 1000 MG (OSM) 24 hr tablet Take 1,000 mg by mouth 2 (two) times daily with a meal.  12/18/10   Historical Provider, MD  nitroGLYCERIN (NITROSTAT) 0.4 MG SL tablet Place 1 tablet (0.4 mg total) under the tongue every 5 (five) minutes as needed for chest pain. 01/05/14   Minna Merritts, MD  ondansetron (ZOFRAN) 4 MG tablet  Take 1 tablet (4 mg total) by mouth every 8 (eight) hours as needed for nausea or vomiting. 05/07/16   Harrie Foreman, MD  potassium chloride (K-DUR) 10 MEQ tablet TAKE ONE TABLET BY MOUTH TWICE DAILY AS NEEDED 02/19/16   Minna Merritts, MD  PRADAXA 150 MG CAPS capsule TAKE ONE CAPSULE BY MOUTH TWICE DAILY 04/02/16   Minna Merritts, MD  simvastatin (ZOCOR) 40 MG tablet TAKE ONE TABLET BY MOUTH AT BEDTIME 03/27/16   Minna Merritts, MD  tamsulosin (FLOMAX) 0.4 MG CAPS capsule Take 0.4 mg by mouth daily.  12/10/13   Historical Provider, MD  traZODone (DESYREL) 50 MG tablet Take 50 mg by mouth at bedtime as needed for sleep.    Historical Provider, MD    Family History Family History  Problem Relation Age of Onset  . Family  history unknown: Yes    Social History Social History  Substance Use Topics  . Smoking status: Former Research scientist (life sciences)  . Smokeless tobacco: Never Used  . Alcohol use Yes     Allergies   Ciprofloxacin and Sitagliptin   Review of Systems Review of Systems  Constitutional: Positive for fever (subjective). Negative for chills.  HENT: Negative for sore throat and tinnitus.   Eyes: Negative for redness.  Respiratory: Negative for cough and shortness of breath.   Cardiovascular: Negative for chest pain and palpitations.  Gastrointestinal: Negative for abdominal pain, diarrhea, nausea and vomiting.  Genitourinary: Negative for dysuria, frequency and urgency.  Musculoskeletal: Negative for myalgias.  Skin: Negative for rash.       No lesions  Neurological: Negative for weakness.  Hematological: Does not bruise/bleed easily.  Psychiatric/Behavioral: Negative for suicidal ideas.     Physical Exam Triage Vital Signs ED Triage Vitals  Enc Vitals Group     BP 05/07/16 1956 (!) 174/95     Pulse Rate 05/07/16 1956 (!) 127     Resp 05/07/16 1956 16     Temp 05/07/16 1956 98.2 F (36.8 C)     Temp Source 05/07/16 1956 Oral     SpO2 05/07/16 1956 96 %     Weight 05/07/16 1957 217 lb (98.4 kg)     Height --      Head Circumference --      Peak Flow --      Pain Score 05/07/16 1958 0     Pain Loc --      Pain Edu? --      Excl. in [place]? --    No data found.   Updated Vital Signs BP (!) 174/95 (BP Location: Left Arm)   Pulse (!) 127   Temp 98.2 F (36.8 C) (Oral)   Resp 16   Wt 217 lb (98.4 kg)   SpO2 96%   BMI 32.05 kg/m   Visual Acuity Right Eye Distance:   Left Eye Distance:   Bilateral Distance:    Right Eye Near:   Left Eye Near:    Bilateral Near:     Physical Exam  Constitutional: He is oriented to person, place, and time. He appears well-developed and well-nourished. No distress.  HENT:  Head: Normocephalic and atraumatic.  Mouth/Throat: Oropharynx is clear and  moist.  Eyes: Conjunctivae and EOM are normal. Pupils are equal, round, and reactive to light. No scleral icterus.  Neck: Normal range of motion. Neck supple. No JVD present. No tracheal deviation present. No thyromegaly present.  Cardiovascular: Regular rhythm and normal heart sounds.  Tachycardia present.  Exam  reveals no gallop and no friction rub.   No murmur heard. Pulmonary/Chest: Effort normal and breath sounds normal. No respiratory distress.  Abdominal: Soft. Bowel sounds are normal. He exhibits no distension. There is no tenderness.  Musculoskeletal: Normal range of motion. He exhibits no edema.  Lymphadenopathy:    He has no cervical adenopathy.  Neurological: He is alert and oriented to person, place, and time. No cranial nerve deficit.  Skin: Skin is warm and dry. No rash noted. No erythema.  Psychiatric: He has a normal mood and affect. His behavior is normal. Judgment and thought content normal.  Vitals reviewed.    UC Treatments / Results  Labs (all labs ordered are listed, but only abnormal results are displayed) Labs Reviewed  CBC WITH DIFFERENTIAL/PLATELET - Abnormal; Notable for the following:       Result Value   Platelets 111 (*)    All other components within normal limits    EKG  EKG Interpretation None       Radiology No results found.  Procedures Procedures (including critical care time)  Medications Ordered in UC Medications  ibuprofen (ADVIL,MOTRIN) tablet 400 mg (not administered)  promethazine (PHENERGAN) injection 25 mg (25 mg Intramuscular Given 05/07/16 2009)     Initial Impression / Assessment and Plan / UC Course  I have reviewed the triage vital signs and the nursing notes.  Pertinent labs & imaging results that were available during my care of the patient were reviewed by me and considered in my medical decision making (see chart for details).     CBC reassuring. Ddx includes viral gastroenteritis or flu-like illness.   Encourage fluids.  Tylenol q4 prn. Pt instructed to go tot he ED if cannot hold down fluids or nighttime meds.  Final Clinical Impressions(s) / UC Diagnoses   Final diagnoses:  Gastroenteritis    New Prescriptions New Prescriptions   ONDANSETRON (ZOFRAN) 4 MG TABLET    Take 1 tablet (4 mg total) by mouth every 8 (eight) hours as needed for nausea or vomiting.     Harrie Foreman, MD 05/07/16 2040

## 2016-05-07 NOTE — ED Triage Notes (Signed)
Patient c/o vomiting that started around 6:15pm today.  Patient reports that he has not taken his medications today.

## 2016-05-08 ENCOUNTER — Inpatient Hospital Stay
Admission: EM | Admit: 2016-05-08 | Discharge: 2016-05-19 | DRG: 871 | Disposition: A | Discharge status: Skilled Nursing Facility | Payer: Medicare Other | Attending: Internal Medicine | Admitting: Internal Medicine

## 2016-05-08 ENCOUNTER — Emergency Department: Payer: Medicare Other

## 2016-05-08 ENCOUNTER — Inpatient Hospital Stay (HOSPITAL_COMMUNITY)
Admit: 2016-05-08 | Discharge: 2016-05-08 | Disposition: A | Discharge status: Home / Self Care | Payer: Medicare Other | Attending: Specialist | Admitting: Specialist

## 2016-05-08 ENCOUNTER — Inpatient Hospital Stay: Payer: Medicare Other

## 2016-05-08 ENCOUNTER — Encounter: Payer: Self-pay | Admitting: Emergency Medicine

## 2016-05-08 DIAGNOSIS — E860 Dehydration: Secondary | ICD-10-CM | POA: Diagnosis present

## 2016-05-08 DIAGNOSIS — F05 Delirium due to known physiological condition: Secondary | ICD-10-CM | POA: Diagnosis not present

## 2016-05-08 DIAGNOSIS — Z85820 Personal history of malignant melanoma of skin: Secondary | ICD-10-CM

## 2016-05-08 DIAGNOSIS — Z6830 Body mass index (BMI) 30.0-30.9, adult: Secondary | ICD-10-CM

## 2016-05-08 DIAGNOSIS — E876 Hypokalemia: Secondary | ICD-10-CM

## 2016-05-08 DIAGNOSIS — Y92009 Unspecified place in unspecified non-institutional (private) residence as the place of occurrence of the external cause: Secondary | ICD-10-CM

## 2016-05-08 DIAGNOSIS — I700 Atherosclerosis of aorta: Secondary | ICD-10-CM | POA: Diagnosis not present

## 2016-05-08 DIAGNOSIS — E785 Hyperlipidemia, unspecified: Secondary | ICD-10-CM | POA: Diagnosis present

## 2016-05-08 DIAGNOSIS — C4491 Basal cell carcinoma of skin, unspecified: Secondary | ICD-10-CM | POA: Diagnosis not present

## 2016-05-08 DIAGNOSIS — I481 Persistent atrial fibrillation: Secondary | ICD-10-CM | POA: Diagnosis present

## 2016-05-08 DIAGNOSIS — R262 Difficulty in walking, not elsewhere classified: Secondary | ICD-10-CM | POA: Diagnosis not present

## 2016-05-08 DIAGNOSIS — Z7901 Long term (current) use of anticoagulants: Secondary | ICD-10-CM | POA: Diagnosis not present

## 2016-05-08 DIAGNOSIS — Z881 Allergy status to other antibiotic agents status: Secondary | ICD-10-CM

## 2016-05-08 DIAGNOSIS — W1830XA Fall on same level, unspecified, initial encounter: Secondary | ICD-10-CM | POA: Diagnosis present

## 2016-05-08 DIAGNOSIS — D72819 Decreased white blood cell count, unspecified: Secondary | ICD-10-CM

## 2016-05-08 DIAGNOSIS — R778 Other specified abnormalities of plasma proteins: Secondary | ICD-10-CM | POA: Diagnosis present

## 2016-05-08 DIAGNOSIS — A419 Sepsis, unspecified organism: Principal | ICD-10-CM | POA: Diagnosis present

## 2016-05-08 DIAGNOSIS — I5043 Acute on chronic combined systolic (congestive) and diastolic (congestive) heart failure: Secondary | ICD-10-CM | POA: Diagnosis present

## 2016-05-08 DIAGNOSIS — S0081XA Abrasion of other part of head, initial encounter: Secondary | ICD-10-CM | POA: Diagnosis present

## 2016-05-08 DIAGNOSIS — I509 Heart failure, unspecified: Secondary | ICD-10-CM | POA: Diagnosis not present

## 2016-05-08 DIAGNOSIS — S060X0A Concussion without loss of consciousness, initial encounter: Secondary | ICD-10-CM | POA: Diagnosis present

## 2016-05-08 DIAGNOSIS — I517 Cardiomegaly: Secondary | ICD-10-CM | POA: Diagnosis not present

## 2016-05-08 DIAGNOSIS — I4891 Unspecified atrial fibrillation: Secondary | ICD-10-CM

## 2016-05-08 DIAGNOSIS — R1310 Dysphagia, unspecified: Secondary | ICD-10-CM | POA: Diagnosis present

## 2016-05-08 DIAGNOSIS — Z9119 Patient's noncompliance with other medical treatment and regimen: Secondary | ICD-10-CM

## 2016-05-08 DIAGNOSIS — I2510 Atherosclerotic heart disease of native coronary artery without angina pectoris: Secondary | ICD-10-CM | POA: Diagnosis not present

## 2016-05-08 DIAGNOSIS — D696 Thrombocytopenia, unspecified: Secondary | ICD-10-CM | POA: Diagnosis present

## 2016-05-08 DIAGNOSIS — J690 Pneumonitis due to inhalation of food and vomit: Secondary | ICD-10-CM

## 2016-05-08 DIAGNOSIS — I110 Hypertensive heart disease with heart failure: Secondary | ICD-10-CM | POA: Diagnosis present

## 2016-05-08 DIAGNOSIS — Z23 Encounter for immunization: Secondary | ICD-10-CM

## 2016-05-08 DIAGNOSIS — I248 Other forms of acute ischemic heart disease: Secondary | ICD-10-CM | POA: Diagnosis not present

## 2016-05-08 DIAGNOSIS — J441 Chronic obstructive pulmonary disease with (acute) exacerbation: Secondary | ICD-10-CM | POA: Diagnosis present

## 2016-05-08 DIAGNOSIS — D72829 Elevated white blood cell count, unspecified: Secondary | ICD-10-CM | POA: Diagnosis not present

## 2016-05-08 DIAGNOSIS — J9601 Acute respiratory failure with hypoxia: Secondary | ICD-10-CM | POA: Diagnosis not present

## 2016-05-08 DIAGNOSIS — J9602 Acute respiratory failure with hypercapnia: Secondary | ICD-10-CM | POA: Diagnosis present

## 2016-05-08 DIAGNOSIS — Z66 Do not resuscitate: Secondary | ICD-10-CM | POA: Diagnosis present

## 2016-05-08 DIAGNOSIS — J189 Pneumonia, unspecified organism: Secondary | ICD-10-CM | POA: Diagnosis not present

## 2016-05-08 DIAGNOSIS — I2489 Other forms of acute ischemic heart disease: Secondary | ICD-10-CM

## 2016-05-08 DIAGNOSIS — M6281 Muscle weakness (generalized): Secondary | ICD-10-CM | POA: Diagnosis not present

## 2016-05-08 DIAGNOSIS — E119 Type 2 diabetes mellitus without complications: Secondary | ICD-10-CM | POA: Diagnosis present

## 2016-05-08 DIAGNOSIS — I482 Chronic atrial fibrillation, unspecified: Secondary | ICD-10-CM | POA: Diagnosis present

## 2016-05-08 DIAGNOSIS — R0602 Shortness of breath: Secondary | ICD-10-CM

## 2016-05-08 DIAGNOSIS — J90 Pleural effusion, not elsewhere classified: Secondary | ICD-10-CM | POA: Diagnosis not present

## 2016-05-08 DIAGNOSIS — Z741 Need for assistance with personal care: Secondary | ICD-10-CM | POA: Diagnosis not present

## 2016-05-08 DIAGNOSIS — N400 Enlarged prostate without lower urinary tract symptoms: Secondary | ICD-10-CM | POA: Diagnosis present

## 2016-05-08 DIAGNOSIS — Z7984 Long term (current) use of oral hypoglycemic drugs: Secondary | ICD-10-CM

## 2016-05-08 DIAGNOSIS — E872 Acidosis: Secondary | ICD-10-CM | POA: Diagnosis present

## 2016-05-08 DIAGNOSIS — J9600 Acute respiratory failure, unspecified whether with hypoxia or hypercapnia: Secondary | ICD-10-CM | POA: Diagnosis not present

## 2016-05-08 DIAGNOSIS — C439 Malignant melanoma of skin, unspecified: Secondary | ICD-10-CM | POA: Diagnosis not present

## 2016-05-08 DIAGNOSIS — J9690 Respiratory failure, unspecified, unspecified whether with hypoxia or hypercapnia: Secondary | ICD-10-CM | POA: Diagnosis not present

## 2016-05-08 DIAGNOSIS — I452 Bifascicular block: Secondary | ICD-10-CM | POA: Diagnosis present

## 2016-05-08 DIAGNOSIS — R451 Restlessness and agitation: Secondary | ICD-10-CM | POA: Diagnosis not present

## 2016-05-08 DIAGNOSIS — I10 Essential (primary) hypertension: Secondary | ICD-10-CM | POA: Diagnosis not present

## 2016-05-08 DIAGNOSIS — N179 Acute kidney failure, unspecified: Secondary | ICD-10-CM

## 2016-05-08 DIAGNOSIS — S0990XA Unspecified injury of head, initial encounter: Secondary | ICD-10-CM | POA: Diagnosis not present

## 2016-05-08 DIAGNOSIS — Z7401 Bed confinement status: Secondary | ICD-10-CM | POA: Diagnosis not present

## 2016-05-08 DIAGNOSIS — K529 Noninfective gastroenteritis and colitis, unspecified: Secondary | ICD-10-CM

## 2016-05-08 DIAGNOSIS — J9811 Atelectasis: Secondary | ICD-10-CM | POA: Diagnosis not present

## 2016-05-08 DIAGNOSIS — E871 Hypo-osmolality and hyponatremia: Secondary | ICD-10-CM | POA: Diagnosis present

## 2016-05-08 DIAGNOSIS — Z87891 Personal history of nicotine dependence: Secondary | ICD-10-CM

## 2016-05-08 DIAGNOSIS — G4733 Obstructive sleep apnea (adult) (pediatric): Secondary | ICD-10-CM | POA: Diagnosis present

## 2016-05-08 DIAGNOSIS — R6521 Severe sepsis with septic shock: Secondary | ICD-10-CM | POA: Diagnosis present

## 2016-05-08 DIAGNOSIS — Z79899 Other long term (current) drug therapy: Secondary | ICD-10-CM

## 2016-05-08 DIAGNOSIS — Z888 Allergy status to other drugs, medicaments and biological substances status: Secondary | ICD-10-CM

## 2016-05-08 DIAGNOSIS — E669 Obesity, unspecified: Secondary | ICD-10-CM | POA: Diagnosis present

## 2016-05-08 DIAGNOSIS — N170 Acute kidney failure with tubular necrosis: Secondary | ICD-10-CM | POA: Diagnosis present

## 2016-05-08 DIAGNOSIS — S199XXA Unspecified injury of neck, initial encounter: Secondary | ICD-10-CM | POA: Diagnosis not present

## 2016-05-08 DIAGNOSIS — R55 Syncope and collapse: Secondary | ICD-10-CM | POA: Diagnosis not present

## 2016-05-08 HISTORY — DX: Chronic diastolic (congestive) heart failure: I50.32

## 2016-05-08 HISTORY — DX: Chronic atrial fibrillation, unspecified: I48.20

## 2016-05-08 HISTORY — DX: Type 2 diabetes mellitus with unspecified complications: E11.8

## 2016-05-08 HISTORY — DX: Sleep apnea, unspecified: G47.30

## 2016-05-08 HISTORY — DX: Nonrheumatic mitral (valve) insufficiency: I34.0

## 2016-05-08 HISTORY — DX: Atherosclerotic heart disease of native coronary artery without angina pectoris: I25.10

## 2016-05-08 LAB — LACTIC ACID, PLASMA
Lactic Acid, Venous: 3.9 mmol/L (ref 0.5–1.9)
Lactic Acid, Venous: 4.4 mmol/L (ref 0.5–1.9)
Lactic Acid, Venous: 5.3 mmol/L (ref 0.5–1.9)
Lactic Acid, Venous: 4.1 mmol/L (ref 0.5–1.9)

## 2016-05-08 LAB — CBC WITH DIFFERENTIAL/PLATELET
Basophils Absolute: 0 10*3/uL (ref 0–0.1)
Basophils Relative: 0 %
Eosinophils Absolute: 0 10*3/uL (ref 0–0.7)
Eosinophils Relative: 0 %
HCT: 40.1 % (ref 40.0–52.0)
Hemoglobin: 13.5 g/dL (ref 13.0–18.0)
Lymphocytes Relative: 10 %
Lymphs Abs: 0.3 10*3/uL — ABNORMAL LOW (ref 1.0–3.6)
MCH: 30.1 pg (ref 26.0–34.0)
MCHC: 33.6 g/dL (ref 32.0–36.0)
MCV: 89.5 fL (ref 80.0–100.0)
Monocytes Absolute: 0.2 10*3/uL (ref 0.2–1.0)
Monocytes Relative: 6 %
Neutro Abs: 2.4 10*3/uL (ref 1.4–6.5)
Neutrophils Relative %: 84 %
Platelets: 96 10*3/uL — ABNORMAL LOW (ref 150–440)
RBC: 4.49 MIL/uL (ref 4.40–5.90)
RDW: 13.8 % (ref 11.5–14.5)
WBC: 2.8 10*3/uL — ABNORMAL LOW (ref 3.8–10.6)

## 2016-05-08 LAB — BLOOD GAS, ARTERIAL
Acid-base deficit: 16.3 mmol/L — ABNORMAL HIGH (ref 0.0–2.0)
Bicarbonate: 11.5 mmol/L — ABNORMAL LOW (ref 20.0–28.0)
Delivery systems: POSITIVE
Expiratory PAP: 6
FIO2: 1
Inspiratory PAP: 14
Mechanical Rate: 20
O2 Saturation: 99.7 %
Patient temperature: 37
pCO2 arterial: 33 mmHg (ref 32.0–48.0)
pH, Arterial: 7.15 — CL (ref 7.350–7.450)
pO2, Arterial: 237 mmHg — ABNORMAL HIGH (ref 83.0–108.0)

## 2016-05-08 LAB — TROPONIN I
Troponin I: 0.42 ng/mL (ref <0.03)
Troponin I: 0.48 ng/mL (ref <0.03)
Troponin I: 0.59 ng/mL (ref <0.03)
Troponin I: 0.69 ng/mL (ref <0.03)

## 2016-05-08 LAB — COMPREHENSIVE METABOLIC PANEL
ALT: 24 U/L (ref 17–63)
AST: 43 U/L — ABNORMAL HIGH (ref 15–41)
Albumin: 3 g/dL — ABNORMAL LOW (ref 3.5–5.0)
Alkaline Phosphatase: 42 U/L (ref 38–126)
Anion gap: 9 (ref 5–15)
BUN: 24 mg/dL — ABNORMAL HIGH (ref 6–20)
CO2: 23 mmol/L (ref 22–32)
Calcium: 7.9 mg/dL — ABNORMAL LOW (ref 8.9–10.3)
Chloride: 106 mmol/L (ref 101–111)
Creatinine, Ser: 1.8 mg/dL — ABNORMAL HIGH (ref 0.61–1.24)
GFR calc Af Amer: 41 mL/min — ABNORMAL LOW (ref >60)
GFR calc non Af Amer: 35 mL/min — ABNORMAL LOW (ref >60)
Glucose, Bld: 119 mg/dL — ABNORMAL HIGH (ref 65–99)
Potassium: 2.8 mmol/L — ABNORMAL LOW (ref 3.5–5.1)
Sodium: 138 mmol/L (ref 135–145)
Total Bilirubin: 3.2 mg/dL — ABNORMAL HIGH (ref 0.3–1.2)
Total Protein: 5.9 g/dL — ABNORMAL LOW (ref 6.5–8.1)

## 2016-05-08 LAB — MAGNESIUM
Magnesium: 1.3 mg/dL — ABNORMAL LOW (ref 1.7–2.4)
Magnesium: 2.4 mg/dL (ref 1.7–2.4)

## 2016-05-08 LAB — APTT: aPTT: 51 seconds — ABNORMAL HIGH (ref 24–36)

## 2016-05-08 LAB — PROCALCITONIN: Procalcitonin: 34.92 ng/mL

## 2016-05-08 LAB — PROTIME-INR
INR: 1.54
Prothrombin Time: 18.6 seconds — ABNORMAL HIGH (ref 11.4–15.2)

## 2016-05-08 LAB — INFLUENZA PANEL BY PCR (TYPE A & B)
Influenza A By PCR: NEGATIVE
Influenza B By PCR: NEGATIVE

## 2016-05-08 LAB — GLUCOSE, CAPILLARY
Glucose-Capillary: 163 mg/dL — ABNORMAL HIGH (ref 65–99)
Glucose-Capillary: 125 mg/dL — ABNORMAL HIGH (ref 65–99)
Glucose-Capillary: 112 mg/dL — ABNORMAL HIGH (ref 65–99)

## 2016-05-08 LAB — POTASSIUM: Potassium: 4 mmol/L (ref 3.5–5.1)

## 2016-05-08 LAB — MRSA PCR SCREENING: MRSA by PCR: NEGATIVE

## 2016-05-08 LAB — HEPARIN LEVEL (UNFRACTIONATED)
Heparin Unfractionated: <0.1 IU/mL — ABNORMAL LOW (ref 0.30–0.70)
Heparin Unfractionated: 0.51 IU/mL (ref 0.30–0.70)

## 2016-05-08 LAB — PHOSPHORUS: Phosphorus: 2.9 mg/dL (ref 2.5–4.6)

## 2016-05-08 MED ORDER — INSULIN ASPART 100 UNIT/ML ~~LOC~~ SOLN
0.0000 [IU] | Freq: Three times a day (TID) | SUBCUTANEOUS | Status: DC
  Administered 2016-05-08: 1 [IU] via SUBCUTANEOUS
  Administered 2016-05-09 (×3): 2 [IU] via SUBCUTANEOUS
  Administered 2016-05-10: 3 [IU] via SUBCUTANEOUS
  Administered 2016-05-10: 2 [IU] via SUBCUTANEOUS
  Administered 2016-05-11: 3 [IU] via SUBCUTANEOUS
  Administered 2016-05-11: 2 [IU] via SUBCUTANEOUS
  Administered 2016-05-11: 1 [IU] via SUBCUTANEOUS
  Administered 2016-05-12: 2 [IU] via SUBCUTANEOUS
  Administered 2016-05-13 – 2016-05-14 (×3): 1 [IU] via SUBCUTANEOUS
  Administered 2016-05-15: 2 [IU] via SUBCUTANEOUS
  Administered 2016-05-15: 1 [IU] via SUBCUTANEOUS
  Administered 2016-05-16 (×2): 2 [IU] via SUBCUTANEOUS
  Administered 2016-05-16 – 2016-05-17 (×2): 1 [IU] via SUBCUTANEOUS
  Administered 2016-05-17: 3 [IU] via SUBCUTANEOUS
  Administered 2016-05-17: 2 [IU] via SUBCUTANEOUS
  Administered 2016-05-18: 3 [IU] via SUBCUTANEOUS
  Administered 2016-05-18: 2 [IU] via SUBCUTANEOUS
  Administered 2016-05-18: 7 [IU] via SUBCUTANEOUS
  Administered 2016-05-19: 2 [IU] via SUBCUTANEOUS
  Administered 2016-05-19: 3 [IU] via SUBCUTANEOUS
  Filled 2016-05-08 (×2): qty 2
  Filled 2016-05-08 (×2): qty 1
  Filled 2016-05-08: qty 2
  Filled 2016-05-08: qty 1
  Filled 2016-05-08: qty 2
  Filled 2016-05-08 (×3): qty 3
  Filled 2016-05-08 (×2): qty 2
  Filled 2016-05-08: qty 1
  Filled 2016-05-08: qty 7
  Filled 2016-05-08: qty 3
  Filled 2016-05-08: qty 1
  Filled 2016-05-08: qty 2
  Filled 2016-05-08: qty 1
  Filled 2016-05-08 (×2): qty 2
  Filled 2016-05-08: qty 1
  Filled 2016-05-08 (×3): qty 2
  Filled 2016-05-08: qty 1
  Filled 2016-05-08: qty 3

## 2016-05-08 MED ORDER — VANCOMYCIN HCL 10 G IV SOLR
1250.0000 mg | INTRAVENOUS | Status: DC
  Administered 2016-05-08 – 2016-05-09 (×2): 1250 mg via INTRAVENOUS
  Filled 2016-05-08 (×3): qty 1250

## 2016-05-08 MED ORDER — TRAZODONE HCL 50 MG PO TABS
50.0000 mg | ORAL_TABLET | Freq: Every evening | ORAL | Status: DC | PRN
  Administered 2016-05-08 – 2016-05-18 (×8): 50 mg via ORAL
  Filled 2016-05-08 (×10): qty 1

## 2016-05-08 MED ORDER — SODIUM CHLORIDE 0.9 % IV BOLUS (SEPSIS)
1000.0000 mL | Freq: Once | INTRAVENOUS | Status: AC
  Administered 2016-05-08: 1000 mL via INTRAVENOUS

## 2016-05-08 MED ORDER — FENTANYL CITRATE (PF) 100 MCG/2ML IJ SOLN
INTRAMUSCULAR | Status: AC
  Filled 2016-05-08: qty 2

## 2016-05-08 MED ORDER — DILTIAZEM HCL ER 120 MG PO CP24
120.0000 mg | ORAL_CAPSULE | Freq: Every day | ORAL | Status: DC
  Filled 2016-05-08: qty 1

## 2016-05-08 MED ORDER — ROCURONIUM BROMIDE 50 MG/5ML IV SOLN
INTRAVENOUS | Status: AC
  Filled 2016-05-08: qty 1

## 2016-05-08 MED ORDER — GUAIFENESIN ER 600 MG PO TB12
600.0000 mg | ORAL_TABLET | Freq: Two times a day (BID) | ORAL | Status: DC
  Administered 2016-05-08 – 2016-05-14 (×12): 600 mg via ORAL
  Filled 2016-05-08 (×14): qty 1

## 2016-05-08 MED ORDER — CARVEDILOL 6.25 MG PO TABS
6.2500 mg | ORAL_TABLET | Freq: Two times a day (BID) | ORAL | Status: DC
Start: 2016-05-09 — End: 2016-05-11
  Administered 2016-05-09 – 2016-05-11 (×5): 6.25 mg via ORAL
  Filled 2016-05-08 (×5): qty 1

## 2016-05-08 MED ORDER — SODIUM CHLORIDE 0.9 % IV BOLUS (SEPSIS)
500.0000 mL | Freq: Once | INTRAVENOUS | Status: AC
  Administered 2016-05-08: 500 mL via INTRAVENOUS

## 2016-05-08 MED ORDER — POTASSIUM CHLORIDE 2 MEQ/ML IV SOLN
INTRAVENOUS | Status: DC
  Administered 2016-05-08: 17:00:00 via INTRAVENOUS
  Filled 2016-05-08 (×3): qty 1000

## 2016-05-08 MED ORDER — TETANUS-DIPHTH-ACELL PERTUSSIS 5-2.5-18.5 LF-MCG/0.5 IM SUSP
0.5000 mL | Freq: Once | INTRAMUSCULAR | Status: AC
  Administered 2016-05-08: 0.5 mL via INTRAMUSCULAR
  Filled 2016-05-08: qty 0.5

## 2016-05-08 MED ORDER — SODIUM BICARBONATE 8.4 % IV SOLN
150.0000 meq | Freq: Once | INTRAVENOUS | Status: AC
  Administered 2016-05-09: 150 meq via INTRAVENOUS
  Filled 2016-05-08: qty 150

## 2016-05-08 MED ORDER — MAGNESIUM SULFATE 2 GM/50ML IV SOLN: 2.0000 g | Freq: Once | INTRAVENOUS | Status: DC

## 2016-05-08 MED ORDER — METHYLPREDNISOLONE SODIUM SUCC 40 MG IJ SOLR
40.0000 mg | Freq: Once | INTRAMUSCULAR | Status: AC
  Administered 2016-05-08: 40 mg via INTRAVENOUS
  Filled 2016-05-08: qty 1

## 2016-05-08 MED ORDER — INSULIN ASPART 100 UNIT/ML ~~LOC~~ SOLN: 0.0000 [IU] | Freq: Every day | SUBCUTANEOUS | Status: DC

## 2016-05-08 MED ORDER — ONDANSETRON HCL 4 MG PO TABS: 4.0000 mg | ORAL_TABLET | Freq: Four times a day (QID) | ORAL | Status: DC | PRN

## 2016-05-08 MED ORDER — ONDANSETRON HCL 4 MG/2ML IJ SOLN
4.0000 mg | Freq: Four times a day (QID) | INTRAMUSCULAR | Status: DC | PRN
  Administered 2016-05-10: 4 mg via INTRAVENOUS
  Filled 2016-05-08: qty 2

## 2016-05-08 MED ORDER — DOPAMINE-DEXTROSE 3.2-5 MG/ML-% IV SOLN
INTRAVENOUS | Status: AC
  Filled 2016-05-08: qty 250

## 2016-05-08 MED ORDER — BUDESONIDE 0.5 MG/2ML IN SUSP
0.5000 mg | Freq: Two times a day (BID) | RESPIRATORY_TRACT | Status: DC
  Administered 2016-05-08 – 2016-05-19 (×21): 0.5 mg via RESPIRATORY_TRACT
  Filled 2016-05-08 (×23): qty 2

## 2016-05-08 MED ORDER — MIDAZOLAM HCL 2 MG/2ML IJ SOLN
INTRAMUSCULAR | Status: AC
  Filled 2016-05-08: qty 2

## 2016-05-08 MED ORDER — CEFEPIME-DEXTROSE 2 GM/50ML IV SOLR
2.0000 g | INTRAVENOUS | Status: DC
  Administered 2016-05-08: 2 g via INTRAVENOUS
  Filled 2016-05-08 (×2): qty 50

## 2016-05-08 MED ORDER — MAGNESIUM SULFATE 4 GM/100ML IV SOLN
4.0000 g | INTRAVENOUS | Status: AC
  Administered 2016-05-08: 4 g via INTRAVENOUS
  Filled 2016-05-08: qty 100

## 2016-05-08 MED ORDER — SODIUM CHLORIDE 0.9 % IV SOLN
INTRAVENOUS | Status: DC
  Administered 2016-05-08: 15:00:00 via INTRAVENOUS

## 2016-05-08 MED ORDER — SODIUM CHLORIDE 0.9% FLUSH
3.0000 mL | Freq: Two times a day (BID) | INTRAVENOUS | Status: DC
  Administered 2016-05-08 – 2016-05-19 (×18): 3 mL via INTRAVENOUS

## 2016-05-08 MED ORDER — METHYLPREDNISOLONE SODIUM SUCC 40 MG IJ SOLR
40.0000 mg | Freq: Two times a day (BID) | INTRAMUSCULAR | Status: DC
  Administered 2016-05-09 – 2016-05-12 (×7): 40 mg via INTRAVENOUS
  Filled 2016-05-08 (×7): qty 1

## 2016-05-08 MED ORDER — IPRATROPIUM-ALBUTEROL 0.5-2.5 (3) MG/3ML IN SOLN
3.0000 mL | RESPIRATORY_TRACT | Status: DC
  Administered 2016-05-08 – 2016-05-10 (×10): 3 mL via RESPIRATORY_TRACT
  Filled 2016-05-08 (×11): qty 3

## 2016-05-08 MED ORDER — HEPARIN BOLUS VIA INFUSION
4500.0000 [IU] | Freq: Once | INTRAVENOUS | Status: AC
  Administered 2016-05-08: 4500 [IU] via INTRAVENOUS
  Filled 2016-05-08: qty 4500

## 2016-05-08 MED ORDER — PIPERACILLIN-TAZOBACTAM 3.375 G IVPB 30 MIN
3.3750 g | Freq: Once | INTRAVENOUS | Status: AC
  Administered 2016-05-08: 3.375 g via INTRAVENOUS
  Filled 2016-05-08: qty 50

## 2016-05-08 MED ORDER — HEPARIN (PORCINE) IN NACL 100-0.45 UNIT/ML-% IJ SOLN
1300.0000 [IU]/h | INTRAMUSCULAR | Status: DC
  Administered 2016-05-08 – 2016-05-10 (×3): 1300 [IU]/h via INTRAVENOUS
  Administered 2016-05-10: 1700 [IU]/h via INTRAVENOUS
  Filled 2016-05-08 (×4): qty 250

## 2016-05-08 MED ORDER — ACETAMINOPHEN 650 MG RE SUPP: 650.0000 mg | Freq: Four times a day (QID) | RECTAL | Status: DC | PRN

## 2016-05-08 MED ORDER — METOPROLOL TARTRATE 5 MG/5ML IV SOLN
2.5000 mg | INTRAVENOUS | Status: DC | PRN
  Administered 2016-05-08: 5 mg via INTRAVENOUS
  Filled 2016-05-08: qty 5

## 2016-05-08 MED ORDER — ACETAMINOPHEN 325 MG PO TABS
650.0000 mg | ORAL_TABLET | Freq: Four times a day (QID) | ORAL | Status: DC | PRN
  Administered 2016-05-15 – 2016-05-19 (×2): 650 mg via ORAL
  Filled 2016-05-08 (×2): qty 2

## 2016-05-08 MED ORDER — POTASSIUM CHLORIDE CRYS ER 20 MEQ PO TBCR
40.0000 meq | EXTENDED_RELEASE_TABLET | Freq: Once | ORAL | Status: AC
  Administered 2016-05-08: 40 meq via ORAL
  Filled 2016-05-08: qty 2

## 2016-05-08 MED ORDER — AMIODARONE HCL IN DEXTROSE 360-4.14 MG/200ML-% IV SOLN
60.0000 mg/h | INTRAVENOUS | Status: AC
  Administered 2016-05-08 (×2): 60 mg/h via INTRAVENOUS
  Filled 2016-05-08 (×2): qty 200

## 2016-05-08 MED ORDER — SODIUM BICARBONATE 8.4 % IV SOLN
INTRAVENOUS | Status: DC
  Administered 2016-05-09 (×2): via INTRAVENOUS
  Filled 2016-05-08 (×4): qty 150

## 2016-05-08 MED ORDER — AMIODARONE LOAD VIA INFUSION
150.0000 mg | Freq: Once | INTRAVENOUS | Status: AC
  Administered 2016-05-08: 150 mg via INTRAVENOUS
  Filled 2016-05-08: qty 83.34

## 2016-05-08 MED ORDER — AMIODARONE HCL IN DEXTROSE 360-4.14 MG/200ML-% IV SOLN
30.0000 mg/h | INTRAVENOUS | Status: AC
  Administered 2016-05-09 (×3): 60 mg/h via INTRAVENOUS
  Administered 2016-05-09: 30 mg/h via INTRAVENOUS
  Administered 2016-05-10 – 2016-05-12 (×7): 60 mg/h via INTRAVENOUS
  Filled 2016-05-08 (×13): qty 200

## 2016-05-08 MED ORDER — POTASSIUM CHLORIDE CRYS ER 20 MEQ PO TBCR: 20.0000 meq | EXTENDED_RELEASE_TABLET | Freq: Two times a day (BID) | ORAL | Status: DC

## 2016-05-08 MED ORDER — FUROSEMIDE 10 MG/ML IJ SOLN
40.0000 mg | Freq: Once | INTRAMUSCULAR | Status: AC
  Administered 2016-05-08: 40 mg via INTRAVENOUS
  Filled 2016-05-08: qty 4

## 2016-05-08 MED ORDER — SODIUM CHLORIDE 0.9 % IV SOLN
30.0000 meq | Freq: Once | INTRAVENOUS | Status: AC
  Administered 2016-05-08: 30 meq via INTRAVENOUS
  Filled 2016-05-08: qty 15

## 2016-05-08 MED ORDER — SIMVASTATIN 40 MG PO TABS
40.0000 mg | ORAL_TABLET | Freq: Every day | ORAL | Status: DC
Start: 2016-05-08 — End: 2016-05-09
  Administered 2016-05-08: 40 mg via ORAL
  Filled 2016-05-08: qty 1

## 2016-05-08 MED ORDER — TAMSULOSIN HCL 0.4 MG PO CAPS
0.4000 mg | ORAL_CAPSULE | Freq: Every day | ORAL | Status: DC
  Administered 2016-05-08 – 2016-05-19 (×12): 0.4 mg via ORAL
  Filled 2016-05-08 (×12): qty 1

## 2016-05-08 MED ORDER — VANCOMYCIN HCL IN DEXTROSE 1-5 GM/200ML-% IV SOLN
1000.0000 mg | Freq: Once | INTRAVENOUS | Status: AC
  Administered 2016-05-08: 1000 mg via INTRAVENOUS
  Filled 2016-05-08: qty 200

## 2016-05-08 NOTE — Consult Note (Signed)
Name: Todd Valentine MRN: [id number] DOB: [birth year]    ADMISSION DATE:  05/08/2016 CONSULTATION DATE: 05/08/2016  REFERRING MD :  Dr. Verdell Carmine  CHIEF COMPLAINT: Loss of Consciousness   BRIEF PATIENT DESCRIPTION:  76 yo male with septic shock secondary to pneumonia, acute respiratory failure secondary to pneumonia and AECOPD, afibb with RVR, leukopenia/thrombocytopenia, hypokalemia, hypomagnesia, and gastroeteritis    SIGNIFICANT EVENTS  03/15-Pt admitted to [facility] stepdown unit due to afibb with RVR, acute respiratory failure, gastroenteritis, and septic shock PCCM consulted for additional management   STUDIES:  CT Cervical Spine and Head 03/15>>No evidence of intracranial or cervical spine injury. Advanced cervical spine degeneration. Chronic microvascular disease in the cerebral white matter  HISTORY OF PRESENT ILLNESS:   This is a 76 yo male with a PMH of Mitral Regurgitation (Echo 09/2013: EF 55%-60%), Malignant Melanoma, Former Smoker, HTN, Hyperlipidemia, Hyperkalemia, Diverticulitis, Diabetes Mellitus, CAD, Chronic Insomnia, Chronic Diastolic CHF, BPH, and Acute Renal Failure.  He presented to [facility] ER 03/15 following a fall at home. The pt was in his usual state of health on 03/14 he went to work and then had several episodes of loose watery stools. Symptoms progressed to generalized weakness, occasional productive cough, fever, and chills, therefore he left work early.  Once he got home he developed several episodes of vomiting and he contacted his daughter.  Due to persistent and worsening symptoms he was taken to urgent care labs were obtained it was noted he was afebrile, WBC 8.8, hgb 13.9, and platelet count 111.  He was subsequently given iv fluids and phenergan with improvement of vomiting.  He declined to be taken to the ER at that time for further treatment and was instructed to push oral fluids at home.  Overnight, the pt had several episodes of vomiting again with generalized  weakness. Around 05:00 am on 03/15 when the pt attempted to go to the restroom he fell and hit his head his daughter found him laying face down on the bathroom floor, and according to the pts daughter he was responsive post fall she notified EMS. Upon arrival to the ER via EMS bp trended down to systolic 76'L, heart rate 140-170's cardiac rhythm afibb with rvr, lactic acid 3.9, K+ 2.8, serum creatinine 1.8 and cxr revealed confluent left lung opacity consistent with aspiration pneumonia.  CT head and cervical spine were negative for intracranial or cervical spine injury.  Pt was subsequently admitted to [facility] with septic shock secondary to pneumonia, acute respiratory failure secondary to pneumonia and AECOPD, afibb with RVR, leukopenia/thrombocytopenia, hypokalemia, hypomagnesia, and gastroeteritis PCCM consulted for additional management 03/15.  PAST MEDICAL HISTORY :   has a past medical history of Acute renal failure ([place]); BPH (benign prostatic hyperplasia); Chronic atrial fibrillation ([place]); Chronic diastolic CHF (congestive heart failure) ([place]); Chronic insomnia; Coronary artery disease, non-occlusive (10/06/2013); Depression; Diabetes mellitus with complication ([place]); History of diverticulitis; Hyperkalemia; Hyperlipidemia; Hypertension; Malignant melanoma ([place]); and Mitral regurgitation.  has a past surgical history that includes Cardiac catheterization (10/06/2013); Mohs surgery; Appendectomy; removal tubular adenoma; Colonoscopy; and Colonoscopy with propofol (N/A, 11/23/2014). Prior to Admission medications   Medication Sig Start Date End Date Taking? Authorizing Provider  acetaminophen (TYLENOL) 500 MG tablet Take 1,000 mg by mouth every 6 (six) hours as needed.   Yes Historical Provider, MD  carvedilol (COREG) 6.25 MG tablet Take 1 tablet (6.25 mg total) by mouth 2 (two) times daily with a meal. 04/25/16  Yes Minna Merritts, MD  diltiazem (DILACOR XR)  120 MG 24 hr capsule TAKE ONE  CAPSULE BY MOUTH ONCE DAILY 06/19/15  Yes Minna Merritts, MD  glimepiride (AMARYL) 4 MG tablet Take 4 mg by mouth 2 (two) times daily.    Yes Historical Provider, MD  lisinopril-hydrochlorothiazide (PRINZIDE,ZESTORETIC) 20-12.5 MG per tablet Take 1 tablet by mouth daily. 04/24/14  Yes Minna Merritts, MD  magnesium oxide (MAG-OX) 400 MG tablet Take 400 mg by mouth daily.   Yes Historical Provider, MD  metformin (FORTAMET) 1000 MG (OSM) 24 hr tablet Take 1,000 mg by mouth 2 (two) times daily with a meal.  12/18/10  Yes Historical Provider, MD  ondansetron (ZOFRAN) 4 MG tablet Take 1 tablet (4 mg total) by mouth every 8 (eight) hours as needed for nausea or vomiting. 05/07/16  Yes Harrie Foreman, MD  PRADAXA 150 MG CAPS capsule TAKE ONE CAPSULE BY MOUTH TWICE DAILY 04/02/16  Yes Minna Merritts, MD  simvastatin (ZOCOR) 40 MG tablet TAKE ONE TABLET BY MOUTH AT BEDTIME 03/27/16  Yes Minna Merritts, MD  tamsulosin (FLOMAX) 0.4 MG CAPS capsule Take 0.4 mg by mouth daily.  12/10/13  Yes Historical Provider, MD  traZODone (DESYREL) 50 MG tablet Take 50 mg by mouth at bedtime as needed for sleep.   Yes Historical Provider, MD  apixaban (ELIQUIS) 5 MG TABS tablet Take 1 tablet (5 mg total) by mouth 2 (two) times daily. 04/25/16   Minna Merritts, MD  furosemide (LASIX) 20 MG tablet TAKE ONE TABLET BY MOUTH TWICE DAILY AS NEEDED 02/19/16   Minna Merritts, MD  nitroGLYCERIN (NITROSTAT) 0.4 MG SL tablet Place 1 tablet (0.4 mg total) under the tongue every 5 (five) minutes as needed for chest pain. 01/05/14   Minna Merritts, MD  potassium chloride (K-DUR) 10 MEQ tablet TAKE ONE TABLET BY MOUTH TWICE DAILY AS NEEDED 02/19/16   Minna Merritts, MD   Allergies  Allergen Reactions  . Ciprofloxacin     Other reaction(s): Other (See Comments) Pt states he could hardly move  . Sitagliptin     Other reaction(s): Other (See Comments) Weakness    FAMILY HISTORY:  Family history is unknown by patient. SOCIAL  HISTORY:  reports that he has quit smoking. He has never used smokeless tobacco. He reports that he drinks alcohol. He reports that he does not use drugs.  REVIEW OF SYSTEMS:  Positives in BOLD Constitutional: fever, chills, weight loss, malaise/fatigue and diaphoresis.  HENT: Negative for hearing loss, ear pain, nosebleeds, congestion, sore throat, neck pain, tinnitus and ear discharge.   Eyes: Negative for blurred vision, double vision, photophobia, pain, discharge and redness.  Respiratory: cough, hemoptysis, sputum production, shortness of breath, wheezing and stridor.   Cardiovascular: Negative for chest pain, palpitations, orthopnea, claudication, leg swelling and PND.  Gastrointestinal: heartburn, nausea, vomiting, abdominal pain, diarrhea, constipation, blood in stool and melena.  Genitourinary: Negative for dysuria, urgency, frequency, hematuria and flank pain.  Musculoskeletal: Negative for myalgias, back pain, joint pain and falls.  Skin: Negative for itching and rash.  Neurological: Negative for dizziness, tingling, tremors, sensory change, speech change, focal weakness, seizures, loss of consciousness, weakness and headaches.  Endo/Heme/Allergies: Negative for environmental allergies and polydipsia. Does not bruise/bleed easily.  SUBJECTIVE:  Pt states he feels really sick and is short of breath.  VITAL SIGNS: Temp:  [97.4 F (36.3 C)-98.2 F (36.8 C)] 97.7 F (36.5 C) (03/15 1405) Pulse Rate:  [116-144] 141 (03/15 1405) Resp:  [15-33] 33 (03/15 1405) BP: (  93-174)/(64-95) 129/70 (03/15 1405) SpO2:  [95 %-99 %] 95 % (03/15 1556) Weight:  [94.1 kg (207 lb 7.3 oz)-98.4 kg (217 lb)] 94.1 kg (207 lb 7.3 oz) (03/15 1405)  PHYSICAL EXAMINATION: General: acutely ill appearing Caucasian elderly male Neuro: alert and oriented, follows commands  HEENT: supple, no JVD Cardiovascular: irregular, irregular, no M/R/G Lungs: diffuse rhonchi throughout, even, slightly labored Abdomen:  obese, hyperactive BS x4, soft, non tender, non distended  Musculoskeletal: normal bulk and tone, no edema  Skin: laceration left forehead with ecchymosis    Recent Labs Lab 05/08/16 1101  NA 138  K 2.8*  CL 106  CO2 23  BUN 24*  CREATININE 1.80*  GLUCOSE 119*    Recent Labs Lab 05/07/16 2006 05/08/16 0952  HGB 13.9 13.5  HCT 41.4 40.1  WBC 8.8 2.8*  PLT 111* 96*   Ct Head Wo Contrast  Result Date: 05/08/2016 CLINICAL DATA:  Unwitnessed fall.  Head abrasion. EXAM: CT HEAD WITHOUT CONTRAST CT CERVICAL SPINE WITHOUT CONTRAST TECHNIQUE: Multidetector CT imaging of the head and cervical spine was performed following the standard protocol without intravenous contrast. Multiplanar CT image reconstructions of the cervical spine were also generated. COMPARISON:  None. FINDINGS: CT HEAD FINDINGS Brain: No evidence of acute infarction, hemorrhage, hydrocephalus, extra-axial collection or mass lesion/mass effect. Moderate low-density in the cerebral white matter attributed to chronic microvascular ischemia. Vascular: Atherosclerotic calcification. Skull: Bubbles of gas in the right infratemporal and temporal fossa is intravenous. No fracture noted. Sinuses/Orbits: Generalized mild mucosal thickening. CT CERVICAL SPINE FINDINGS Alignment: No traumatic malalignment. Facet mediated anterolisthesis at C4-5, C5-6, C6-7, and C7-T1. Skull base and vertebrae: Negative for acute fracture. Soft tissues and spinal canal: No prevertebral fluid or swelling. No visible canal hematoma. Disc levels: Diffuse advanced facet arthropathy bulky spurring and multilevel listhesis. Milder disc degeneration. Advanced atlantodental degeneration with anterior arch thinning. Upper chest: No acute finding IMPRESSION: 1. No evidence of intracranial or cervical spine injury. 2. Advanced cervical spine degeneration. 3. Chronic microvascular disease in the cerebral white matter. Electronically Signed   By: [name]    On: 05/08/2016 09:42   Ct Cervical Spine Wo Contrast  Result Date: 05/08/2016 CLINICAL DATA:  Unwitnessed fall.  Head abrasion. EXAM: CT HEAD WITHOUT CONTRAST CT CERVICAL SPINE WITHOUT CONTRAST TECHNIQUE: Multidetector CT imaging of the head and cervical spine was performed following the standard protocol without intravenous contrast. Multiplanar CT image reconstructions of the cervical spine were also generated. COMPARISON:  None. FINDINGS: CT HEAD FINDINGS Brain: No evidence of acute infarction, hemorrhage, hydrocephalus, extra-axial collection or mass lesion/mass effect. Moderate low-density in the cerebral white matter attributed to chronic microvascular ischemia. Vascular: Atherosclerotic calcification. Skull: Bubbles of gas in the right infratemporal and temporal fossa is intravenous. No fracture noted. Sinuses/Orbits: Generalized mild mucosal thickening. CT CERVICAL SPINE FINDINGS Alignment: No traumatic malalignment. Facet mediated anterolisthesis at C4-5, C5-6, C6-7, and C7-T1. Skull base and vertebrae: Negative for acute fracture. Soft tissues and spinal canal: No prevertebral fluid or swelling. No visible canal hematoma. Disc levels: Diffuse advanced facet arthropathy bulky spurring and multilevel listhesis. Milder disc degeneration. Advanced atlantodental degeneration with anterior arch thinning. Upper chest: No acute finding IMPRESSION: 1. No evidence of intracranial or cervical spine injury. 2. Advanced cervical spine degeneration. 3. Chronic microvascular disease in the cerebral white matter. Electronically Signed   By: [name]   On: 05/08/2016 09:42   Dg Chest Port 1 View  Result Date: 05/08/2016 CLINICAL DATA:  76 year old male  with vomiting since 1815 hours yesterday. Syncope this morning. Initial encounter. EXAM: PORTABLE CHEST 1 VIEW COMPARISON:  None. FINDINGS: Portable AP semi upright view at 0915 hours. Confluent abnormal pulmonary opacity throughout the mid and lower left  lung, sparing the left costophrenic angle. No definite pleural effusion. Stable cardiomegaly and mediastinal contours. Calcified aortic atherosclerosis. Visualized tracheal air column is within normal limits. The right lung appears clear when allowing for portable technique. No pneumothorax. Negative visible bowel gas pattern. No acute osseous abnormality identified. IMPRESSION: 1. Confluent left lung opacity compatible with aspiration and/or pneumonia in this clinical setting. No associated pleural effusion. 2. Mild cardiomegaly.  Calcified aortic atherosclerosis. Electronically Signed   By: Genevie Ann M.D.   On: 05/08/2016 09:42    ASSESSMENT / PLAN: Acute Respiratory Failure secondary to pneumonia and AECOPD (Former smoker has not been officially dx with COPD) Atrial Fibrillation with RVR  Elevated troponin's secondary to acute respiratory failure and gastroenteritis  Gastroenteritis  Hypomagnesia Hypokalemia Leukopenia/thrombocytopenia Hx: Chronic diastolic CHF  P: Supplemental O2 to maintain O2 sats 90%-94% Influenza PCR results pending CT Chest results pending Continue Cefepime and Vancomycin Trend WBC and monitor fever curve Trend PCT's and lactic acid Follow cultures  Aggressive bronchodilator therapy IV steroid's   Repeat CXR in am 03/16 Continue Heparin and Amiodarone gtt per Cardiology recommendations  Cardiology consulted appreciate input  Trend BMP's Replace electrolytes as indicated  Trend CBC's Monitor for s/sx of bleeding  Transfuse for hgb <7  Marda Stalker, [place] Pager [phone] (please enter 7 digits) PCCM Consult Pager [phone] (please enter 7 digits)

## 2016-05-08 NOTE — Care Management (Signed)
Placed in icu stepdown due to sx concerning for sepsis from pneumonina, low blood pressure and rapid atrial fib. In the process of transitioning to chronic Eliquis after being on Pradaxa. Independent in all adls, denies issues accessing medical care, obtaining medications or with transportation.  Current with  PCP.

## 2016-05-08 NOTE — Progress Notes (Signed)
  Patient was found unresponsive for a few seconds, noted to be hypotensive and cyanosed.  Patient was not drawing any TV on the BiPAP.  Patient had a HR of 100's and did not loose his pulse in the event. He responded to the sternal rub.  Patient received a fluid bolus of approximately 125 ml.for hypotension. ABG was obtained  Which was concerning for severe metabolic acidosis 3.20/23/343/56.8.  Patient was given 3 Amps of bicarbonate and was started on a bicarbonate drip.  Will obtain ABG in an hour.    [facility]

## 2016-05-08 NOTE — ED Provider Notes (Addendum)
[facility] Emergency Department Provider Note ____________________________________________   I have reviewed the triage vital signs and the triage nursing note.  HISTORY  Chief Complaint Loss of Consciousness   Historian Limited history from patient, poor historian and altered mental status Daughter and son-in-law provide additional history Review of note from Dr. Marcille Blanco, urgent care yesterday  HPI Todd Valentine is a 76 y.o. male with history of A. fib, CHF, diabetes, presents by EMS this morning after unwitnessed fall. Patient states they he feels weak all over and was coming out of the bathroom and developed what happened next. His daughter was staying with him last night after the patient had been seen at urgent care due to vomiting/diarrhea and subjective chills yesterday.  He had a CBC which showed a normal white blood cell count, and sounds like did not have any additional vomiting and was discharged home. Daughter stayed with him last night. States that he was up around 6 AM. Then she heard him get up to go to the bathroom and heard the bump of him falling and found him slumped on the ground. EMS reports blood pressure is near 90/60.  Patient himself has not been complaining of any specific pain. He states that he feels weak all over.    Past Medical History:  Diagnosis Date  . A-fib ([place])   . Acute renal failure ([place])   . BPH (benign prostatic hyperplasia)   . CHF (congestive heart failure) ([place])   . Chronic insomnia   . Coronary artery disease 10/06/2013   [facility]  . Depression   . Diabetes mellitus without complication ([place])   . History of diverticulitis   . Hyperkalemia   . Hyperlipidemia   . Hypertension   . Malignant melanoma ([place])   . Systolic CHF ([place])   . Systolic murmur     Patient Active Problem List   Diagnosis Date Noted  . Sepsis ([place]) 05/08/2016  . Chronic atrial fibrillation ([place]) 01/02/2014  . Acute on chronic diastolic CHF  (congestive heart failure) ([place]) 01/02/2014  . Morbid obesity ([place]) 01/02/2014  . Coronary artery disease involving native coronary artery of native heart without angina pectoris 01/02/2014  . Bilateral leg edema 01/02/2014  . Diabetes mellitus type 2 with complications ([place]) 21/19/4174  . History of smoking 30 or more pack years 01/02/2014  . Shortness of breath 01/02/2014  . Hyperlipidemia 01/02/2014    Past Surgical History:  Procedure Laterality Date  . APPENDECTOMY    . CARDIAC CATHETERIZATION  10/06/2013  . COLONOSCOPY    . COLONOSCOPY WITH PROPOFOL N/A 11/23/2014   Procedure: COLONOSCOPY WITH PROPOFOL;  Surgeon: Manya Silvas, MD;  Location: [facility] ENDOSCOPY;  Service: Endoscopy;  Laterality: N/A;  . MOHS SURGERY    . removal tubular adenoma      Prior to Admission medications   Medication Sig Start Date End Date Taking? Authorizing Provider  acetaminophen (TYLENOL) 500 MG tablet Take 1,000 mg by mouth every 6 (six) hours as needed.   Yes Historical Provider, MD  carvedilol (COREG) 6.25 MG tablet Take 1 tablet (6.25 mg total) by mouth 2 (two) times daily with a meal. 04/25/16  Yes Minna Merritts, MD  diltiazem (DILACOR XR) 120 MG 24 hr capsule TAKE ONE CAPSULE BY MOUTH ONCE DAILY 06/19/15  Yes Minna Merritts, MD  glimepiride (AMARYL) 4 MG tablet Take 4 mg by mouth 2 (two) times daily.    Yes Historical Provider, MD  lisinopril-hydrochlorothiazide (PRINZIDE,ZESTORETIC) 20-12.5 MG per  tablet Take 1 tablet by mouth daily. 04/24/14  Yes Minna Merritts, MD  magnesium oxide (MAG-OX) 400 MG tablet Take 400 mg by mouth daily.   Yes Historical Provider, MD  metformin (FORTAMET) 1000 MG (OSM) 24 hr tablet Take 1,000 mg by mouth 2 (two) times daily with a meal.  12/18/10  Yes Historical Provider, MD  ondansetron (ZOFRAN) 4 MG tablet Take 1 tablet (4 mg total) by mouth every 8 (eight) hours as needed for nausea or vomiting. 05/07/16  Yes Harrie Foreman, MD  PRADAXA 150 MG CAPS capsule  TAKE ONE CAPSULE BY MOUTH TWICE DAILY 04/02/16  Yes Minna Merritts, MD  simvastatin (ZOCOR) 40 MG tablet TAKE ONE TABLET BY MOUTH AT BEDTIME 03/27/16  Yes Minna Merritts, MD  tamsulosin (FLOMAX) 0.4 MG CAPS capsule Take 0.4 mg by mouth daily.  12/10/13  Yes Historical Provider, MD  traZODone (DESYREL) 50 MG tablet Take 50 mg by mouth at bedtime as needed for sleep.   Yes Historical Provider, MD  apixaban (ELIQUIS) 5 MG TABS tablet Take 1 tablet (5 mg total) by mouth 2 (two) times daily. 04/25/16   Minna Merritts, MD  furosemide (LASIX) 20 MG tablet TAKE ONE TABLET BY MOUTH TWICE DAILY AS NEEDED 02/19/16   Minna Merritts, MD  nitroGLYCERIN (NITROSTAT) 0.4 MG SL tablet Place 1 tablet (0.4 mg total) under the tongue every 5 (five) minutes as needed for chest pain. 01/05/14   Minna Merritts, MD  potassium chloride (K-DUR) 10 MEQ tablet TAKE ONE TABLET BY MOUTH TWICE DAILY AS NEEDED 02/19/16   Minna Merritts, MD    Allergies  Allergen Reactions  . Ciprofloxacin     Other reaction(s): Other (See Comments) Pt states he could hardly move  . Sitagliptin     Other reaction(s): Other (See Comments) Weakness    Family History  Problem Relation Age of Onset  . Family history unknown: Yes    Social History Social History  Substance Use Topics  . Smoking status: Former Research scientist (life sciences)  . Smokeless tobacco: Never Used  . Alcohol use Yes    Review of Systems  Constitutional: Subjective chills. Eyes: Negative for visual changes. ENT: Negative for sore throat. Cardiovascular: Negative for chest pain. Respiratory: Negative for shortness of breath. Gastrointestinal: Several episodes of vomiting and diarrhea yesterday. Nonbloody nonbilious emesis. Watery diarrhea no additional episodes of diarrhea overnight. No additional episodes of vomiting overnight.. Genitourinary: Negative for dysuria. Musculoskeletal: Negative for back pain. Skin: Negative for rash. Neurological: Negative for headache. 10  point Review of Systems otherwise negative ____________________________________________   PHYSICAL EXAM:  VITAL SIGNS: ED Triage Vitals  Enc Vitals Group     BP      Pulse      Resp      Temp      Temp src      SpO2      Weight      Height      Head Circumference      Peak Flow      Pain Score      Pain Loc      Pain Edu?      Excl. in [place]?      Constitutional: Alert and Cooperative, but somewhat slow to answer questions, seems a little confused. No acute distress.[name] HEENT   Head: Abrasion left forehead.      Eyes: Conjunctivae are normal. PERRL. Normal extraocular movements.      Ears:  Nose: No congestion/rhinnorhea.   Mouth/Throat: Mucous membranes are moist.   Neck: No stridor. Cardiovascular/Chest: Irregularly irregular and tachycardic.  No murmurs, rubs, or gallops. Respiratory: Normal respiratory effort without tachypnea nor retractions. Moderate ronchi without wheezing. Gastrointestinal: Soft. No distention, no guarding, no rebound. Nontender.   Genitourinary/rectal:Deferred Musculoskeletal: Nontender with normal range of motion in all extremities. No joint effusions.  No lower extremity tenderness. Trace edema bilateral lower extremities. Neurologic:  Normal speech and language. No gross or focal neurologic deficits are appreciated. Skin:  Skin is warm, dry and intact. No rash noted. Psychiatric: No agitation.   ____________________________________________  LABS (pertinent positives/negatives)  Labs Reviewed  CBC WITH DIFFERENTIAL/PLATELET - Abnormal; Notable for the following:       Result Value   WBC 2.8 (*)    Platelets 96 (*)    Lymphs Abs 0.3 (*)    All other components within normal limits  LACTIC ACID, PLASMA - Abnormal; Notable for the following:    Lactic Acid, Venous 3.9 (*)    All other components within normal limits  TROPONIN I - Abnormal; Notable for the following:    Troponin I 0.42 (*)    All other components within  normal limits  COMPREHENSIVE METABOLIC PANEL - Abnormal; Notable for the following:    Potassium 2.8 (*)    Glucose, Bld 119 (*)    BUN 24 (*)    Creatinine, Ser 1.80 (*)    Calcium 7.9 (*)    Total Protein 5.9 (*)    Albumin 3.0 (*)    AST 43 (*)    Total Bilirubin 3.2 (*)    GFR calc non Af Amer 35 (*)    GFR calc Af Amer 41 (*)    All other components within normal limits  CULTURE, BLOOD (ROUTINE X 2)  CULTURE, BLOOD (ROUTINE X 2)  LACTIC ACID, PLASMA  URINALYSIS, COMPLETE (UACMP) WITH MICROSCOPIC    ____________________________________________    EKG I, Lisa Roca, MD, the attending physician have personally viewed and interpreted all ECGs.  142 bpm. Atrial fibrillation with rapid ventricular response. Right bundle branch block. Left anterior fascicular block. Occasional PVC. Nonspecific ST and T-wave with some T-wave inversion and minimal ST segment depression inferiorly and laterally. ____________________________________________  RADIOLOGY All Xrays were viewed by me. Imaging interpreted by Radiologist.  Ct head and c spine without contrast:  IMPRESSION: 1. No evidence of intracranial or cervical spine injury. 2. Advanced cervical spine degeneration. 3. Chronic microvascular disease in the cerebral white matter.  CXR portable: IMPRESSION: 1. Confluent left lung opacity compatible with aspiration and/or pneumonia in this clinical setting. No associated pleural effusion. 2. Mild cardiomegaly.  Calcified aortic atherosclerosis. __________________________________________  PROCEDURES  Procedure(s) performed: None  Critical Care performed: CRITICAL CARE Performed by: Lisa Roca   Total critical care time: 45 minutes  Critical care time was exclusive of separately billable procedures and treating other patients.  Critical care was necessary to treat or prevent imminent or life-threatening deterioration.  Critical care was time spent personally by me on  the following activities: development of treatment plan with patient and/or surrogate as well as nursing, discussions with consultants, evaluation of patient's response to treatment, examination of patient, obtaining history from patient or surrogate, ordering and performing treatments and interventions, ordering and review of laboratory studies, ordering and review of radiographic studies, pulse oximetry and re-evaluation of patient's condition.   ____________________________________________   ED COURSE / ASSESSMENT AND PLAN  Pertinent labs & imaging results that were available  during my care of the patient were reviewed by me and considered in my medical decision making (see chart for details).   Mr. Fearnow is here after an unwitnessed fall that sounds like he got lightheaded and passed out. He is found to be hypotensive and have rapid atrial fibrillation. Given the vomiting and diarrhea yesterday, I'm most suspicious of hypovolemia as the cause of his hypotension, and also consider possible sepsis. I think the rapid A. fib is less likely to be the source of the hypotension. I initially started with 500 cc given concern about CHF, until seeing the chest x-ray.  In terms of traumatic evaluation, cervical collar was placed while CT of head and neck were obtained.  After reviewing reports, I did okay for cervical collar to be removed. Next the next line patient was updated on his tetanus, they think was longer than 5 years.  His chest x-ray shows possible pneumonia versus aspiration.  I am covering with vancomycin and Zosyn at this point with a new neutropenia, for possible sepsis. He has received a total of 1 L and is still hypotensive 75 systolic but tachycardia has improved from 140s to mostly in the 120s. I am going to give him a second liter of fluid. Second IV was placed.  I spoke with hospitalist for admission.   CONSULTATIONS:  Hospitalist for admission, Dr. Verdell Carmine.  Patient /  Family / Caregiver informed of clinical course, medical decision-making process, and agree with plan.   Addended to include, troponin 0.42, suspect demand ischemia. Mild acute renal failure with GFR 35. Potassium 2.8, given by mouth and IV replacement started. Discussed with Dr. Verdell Carmine.  ___________________________________________   FINAL CLINICAL IMPRESSION(S) / ED DIAGNOSES   Final diagnoses:  Atrial fibrillation with rapid ventricular response ([place])  Concussion without loss of consciousness, initial encounter  Abrasion of face, initial encounter  Sepsis, due to unspecified organism [facility])  Aspiration pneumonia of left lower lobe, unspecified aspiration pneumonia type [facility])  Community acquired pneumonia of left lung, unspecified part of lung              Note: This dictation was prepared with Sales executive. Any transcriptional errors that result from this process are unintentional    Lisa Roca, MD 05/08/16 [place], MD 05/08/16 [phone]

## 2016-05-08 NOTE — Progress Notes (Signed)
Pt continues to have coarse breath sounds through out all fields. Pt remains alert and oriented. Pt is denying pain. Pt remains on 2 L [place]. Full assessment in EPIC. Report given to Glorious Peach, Therapist, sports.

## 2016-05-08 NOTE — Plan of Care (Signed)
Problem: Education: Goal: Knowledge of [facility] Education information/materials will improve Outcome: Progressing Pt and family provided with the welcome packet. Questions were answered. Pt and family oriented to the unit.

## 2016-05-08 NOTE — H&P (Addendum)
Todd Valentine at [place] NAME: Todd Valentine    MR#:  [id number]  DATE OF BIRTH:  06/28/1940  DATE OF ADMISSION:  05/08/2016  PRIMARY CARE PHYSICIAN: [name], MD   REQUESTING/REFERRING PHYSICIAN: Dr. Lisa Roca.   CHIEF COMPLAINT:   Chief Complaint  Patient presents with  . Loss of Consciousness    HISTORY OF PRESENT ILLNESS:  Jabron Todd Valentine  is a 76 y.o. male with a known history of Chronic atrial fibrillation, history of congestive heart failure, history of coronary artery disease, diabetes, hypertension, hyperlipidemia, history of melanoma who presents to the hospital due to weakness and a fall. Patient says that he was in his usual state of health when yesterday at work he started developing significant chills and therefore left home early. He also has been having a nonproductive cough for the past 2 days. His daughter came to stay with him yesterday and patient had some episodes of nausea and vomiting and therefore she took him to urgent care. At urgent care patient was treated with some IV fluids, given some Zofran for nausea and then sent home. Overnight when he attempted to go to the bathroom he fell and hit his head. He had a small skin tear on his forehead and was bleeding from it and therefore the daughter called EMS and he was brought to the emergency room. In the ER patient was noted to be in atrial fibrillation with rapid ventricular response, and his chest x-ray findings were suggestive of a left lower lobe pneumonia and he was noted to be leukopenic with elevated lactic acid suspected to have sepsis. Hospitalist services were contacted further treatment and evaluation.  PAST MEDICAL HISTORY:   Past Medical History:  Diagnosis Date  . A-fib ([place])   . Acute renal failure ([place])   . BPH (benign prostatic hyperplasia)   . CHF (congestive heart failure) ([place])   . Chronic insomnia   . Coronary artery disease 10/06/2013    [facility]  . Depression   . Diabetes mellitus without complication ([place])   . History of diverticulitis   . Hyperkalemia   . Hyperlipidemia   . Hypertension   . Malignant melanoma ([place])   . Systolic CHF ([place])   . Systolic murmur     PAST SURGICAL HISTORY:   Past Surgical History:  Procedure Laterality Date  . APPENDECTOMY    . CARDIAC CATHETERIZATION  10/06/2013  . COLONOSCOPY    . COLONOSCOPY WITH PROPOFOL N/A 11/23/2014   Procedure: COLONOSCOPY WITH PROPOFOL;  Surgeon: Manya Silvas, MD;  Location[facility] ENDOSCOPY;  Service: Endoscopy;  Laterality: N/A;  . MOHS SURGERY    . removal tubular adenoma      SOCIAL HISTORY:   Social History  Substance Use Topics  . Smoking status: Former Research scientist (life sciences)  . Smokeless tobacco: Never Used  . Alcohol use Yes    FAMILY HISTORY:   Family History  Problem Relation Age of Onset  . Family history unknown: Yes    DRUG ALLERGIES:   Allergies  Allergen Reactions  . Ciprofloxacin     Other reaction(s): Other (See Comments) Pt states he could hardly move  . Sitagliptin     Other reaction(s): Other (See Comments) Weakness    REVIEW OF SYSTEMS:   Review of Systems  Constitutional: Negative for fever and weight loss.  HENT: Negative for congestion, nosebleeds and tinnitus.   Eyes: Negative for blurred vision, double vision and redness.  Respiratory: Positive for  cough. Negative for hemoptysis and shortness of breath.   Cardiovascular: Negative for chest pain, orthopnea, leg swelling and PND.  Gastrointestinal: Negative for abdominal pain, diarrhea, melena, nausea and vomiting.  Genitourinary: Negative for dysuria, hematuria and urgency.  Musculoskeletal: Positive for falls. Negative for joint pain.  Neurological: Positive for weakness. Negative for dizziness, tingling, sensory change, focal weakness, seizures and headaches.  Endo/Heme/Allergies: Negative for polydipsia. Does not bruise/bleed easily.  Psychiatric/Behavioral: Negative  for depression and memory loss. The patient is not nervous/anxious.     MEDICATIONS AT HOME:   Prior to Admission medications   Medication Sig Start Date End Date Taking? Authorizing Provider  acetaminophen (TYLENOL) 500 MG tablet Take 1,000 mg by mouth every 6 (six) hours as needed.   Yes Historical Provider, MD  carvedilol (COREG) 6.25 MG tablet Take 1 tablet (6.25 mg total) by mouth 2 (two) times daily with a meal. 04/25/16  Yes Minna Merritts, MD  diltiazem (DILACOR XR) 120 MG 24 hr capsule TAKE ONE CAPSULE BY MOUTH ONCE DAILY 06/19/15  Yes Minna Merritts, MD  glimepiride (AMARYL) 4 MG tablet Take 4 mg by mouth 2 (two) times daily.    Yes Historical Provider, MD  lisinopril-hydrochlorothiazide (PRINZIDE,ZESTORETIC) 20-12.5 MG per tablet Take 1 tablet by mouth daily. 04/24/14  Yes Minna Merritts, MD  magnesium oxide (MAG-OX) 400 MG tablet Take 400 mg by mouth daily.   Yes Historical Provider, MD  metformin (FORTAMET) 1000 MG (OSM) 24 hr tablet Take 1,000 mg by mouth 2 (two) times daily with a meal.  12/18/10  Yes Historical Provider, MD  ondansetron (ZOFRAN) 4 MG tablet Take 1 tablet (4 mg total) by mouth every 8 (eight) hours as needed for nausea or vomiting. 05/07/16  Yes Harrie Foreman, MD  PRADAXA 150 MG CAPS capsule TAKE ONE CAPSULE BY MOUTH TWICE DAILY 04/02/16  Yes Minna Merritts, MD  simvastatin (ZOCOR) 40 MG tablet TAKE ONE TABLET BY MOUTH AT BEDTIME 03/27/16  Yes Minna Merritts, MD  tamsulosin (FLOMAX) 0.4 MG CAPS capsule Take 0.4 mg by mouth daily.  12/10/13  Yes Historical Provider, MD  traZODone (DESYREL) 50 MG tablet Take 50 mg by mouth at bedtime as needed for sleep.   Yes Historical Provider, MD  apixaban (ELIQUIS) 5 MG TABS tablet Take 1 tablet (5 mg total) by mouth 2 (two) times daily. 04/25/16   Minna Merritts, MD  furosemide (LASIX) 20 MG tablet TAKE ONE TABLET BY MOUTH TWICE DAILY AS NEEDED 02/19/16   Minna Merritts, MD  nitroGLYCERIN (NITROSTAT) 0.4 MG SL tablet  Place 1 tablet (0.4 mg total) under the tongue every 5 (five) minutes as needed for chest pain. 01/05/14   Minna Merritts, MD  potassium chloride (K-DUR) 10 MEQ tablet TAKE ONE TABLET BY MOUTH TWICE DAILY AS NEEDED 02/19/16   Minna Merritts, MD      VITAL SIGNS:  Blood pressure 107/71, pulse (!) 144, temperature 97.4 F (36.3 C), temperature source Oral, resp. rate 15, height 5\' 9"  (1.753 m), weight 95.3 kg (210 lb), SpO2 99 %.  PHYSICAL EXAMINATION:  Physical Exam  GENERAL:  76 y.o.-year-old patient lying in bed lethargic but in NAD.  EYES: Pupils equal, round, reactive to light and accommodation. No scleral icterus. Extraocular muscles intact.  HEENT: Head atraumatic, normocephalic. Oropharynx and nasopharynx clear. No oropharyngeal erythema, dry oral mucosa.  NECK:  Supple, no jugular venous distention. No thyroid enlargement, no tenderness.  LUNGS: Normal breath sounds bilaterally, no  wheezing, rales, rhonchi. No use of accessory muscles of respiration.  CARDIOVASCULAR: S1, S2, Tachycardic, Irregular. No murmurs, rubs, gallops, clicks.  ABDOMEN: Soft, nontender, nondistended. Bowel sounds present. No organomegaly or mass.  EXTREMITIES: No pedal edema, cyanosis, or clubbing. + 2 pedal & radial pulses b/l.   NEUROLOGIC: Cranial nerves II through XII are intact. No focal Motor or sensory deficits appreciated b/l. Globally weak.  PSYCHIATRIC: The patient is alert and oriented x 3.  SKIN: No obvious rash, lesion, or ulcer.   LABORATORY PANEL:   CBC  Recent Labs Lab 05/08/16 0952  WBC 2.8*  HGB 13.5  HCT 40.1  PLT 96*   ------------------------------------------------------------------------------------------------------------------  Chemistries  No results for input(s): NA, K, CL, CO2, GLUCOSE, BUN, CREATININE, CALCIUM, MG, AST, ALT, ALKPHOS, BILITOT in the last 168 hours.  Invalid input(s):  GFRCGP ------------------------------------------------------------------------------------------------------------------  Cardiac Enzymes No results for input(s): TROPONINI in the last 168 hours. ------------------------------------------------------------------------------------------------------------------  RADIOLOGY:  Ct Head Wo Contrast  Result Date: 05/08/2016 CLINICAL DATA:  Unwitnessed fall.  Head abrasion. EXAM: CT HEAD WITHOUT CONTRAST CT CERVICAL SPINE WITHOUT CONTRAST TECHNIQUE: Multidetector CT imaging of the head and cervical spine was performed following the standard protocol without intravenous contrast. Multiplanar CT image reconstructions of the cervical spine were also generated. COMPARISON:  None. FINDINGS: CT HEAD FINDINGS Brain: No evidence of acute infarction, hemorrhage, hydrocephalus, extra-axial collection or mass lesion/mass effect. Moderate low-density in the cerebral white matter attributed to chronic microvascular ischemia. Vascular: Atherosclerotic calcification. Skull: Bubbles of gas in the right infratemporal and temporal fossa is intravenous. No fracture noted. Sinuses/Orbits: Generalized mild mucosal thickening. CT CERVICAL SPINE FINDINGS Alignment: No traumatic malalignment. Facet mediated anterolisthesis at C4-5, C5-6, C6-7, and C7-T1. Skull base and vertebrae: Negative for acute fracture. Soft tissues and spinal canal: No prevertebral fluid or swelling. No visible canal hematoma. Disc levels: Diffuse advanced facet arthropathy bulky spurring and multilevel listhesis. Milder disc degeneration. Advanced atlantodental degeneration with anterior arch thinning. Upper chest: No acute finding IMPRESSION: 1. No evidence of intracranial or cervical spine injury. 2. Advanced cervical spine degeneration. 3. Chronic microvascular disease in the cerebral white matter. Electronically Signed   By: [name]   On: 05/08/2016 09:42   Ct Cervical Spine Wo  Contrast  Result Date: 05/08/2016 CLINICAL DATA:  Unwitnessed fall.  Head abrasion. EXAM: CT HEAD WITHOUT CONTRAST CT CERVICAL SPINE WITHOUT CONTRAST TECHNIQUE: Multidetector CT imaging of the head and cervical spine was performed following the standard protocol without intravenous contrast. Multiplanar CT image reconstructions of the cervical spine were also generated. COMPARISON:  None. FINDINGS: CT HEAD FINDINGS Brain: No evidence of acute infarction, hemorrhage, hydrocephalus, extra-axial collection or mass lesion/mass effect. Moderate low-density in the cerebral white matter attributed to chronic microvascular ischemia. Vascular: Atherosclerotic calcification. Skull: Bubbles of gas in the right infratemporal and temporal fossa is intravenous. No fracture noted. Sinuses/Orbits: Generalized mild mucosal thickening. CT CERVICAL SPINE FINDINGS Alignment: No traumatic malalignment. Facet mediated anterolisthesis at C4-5, C5-6, C6-7, and C7-T1. Skull base and vertebrae: Negative for acute fracture. Soft tissues and spinal canal: No prevertebral fluid or swelling. No visible canal hematoma. Disc levels: Diffuse advanced facet arthropathy bulky spurring and multilevel listhesis. Milder disc degeneration. Advanced atlantodental degeneration with anterior arch thinning. Upper chest: No acute finding IMPRESSION: 1. No evidence of intracranial or cervical spine injury. 2. Advanced cervical spine degeneration. 3. Chronic microvascular disease in the cerebral white matter. Electronically Signed   By: [name]   On: 05/08/2016 09:42  Dg Chest Port 1 View  Result Date: 05/08/2016 CLINICAL DATA:  76 year old male with vomiting since 1815 hours yesterday. Syncope this morning. Initial encounter. EXAM: PORTABLE CHEST 1 VIEW COMPARISON:  None. FINDINGS: Portable AP semi upright view at 0915 hours. Confluent abnormal pulmonary opacity throughout the mid and lower left lung, sparing the left costophrenic angle. No  definite pleural effusion. Stable cardiomegaly and mediastinal contours. Calcified aortic atherosclerosis. Visualized tracheal air column is within normal limits. The right lung appears clear when allowing for portable technique. No pneumothorax. Negative visible bowel gas pattern. No acute osseous abnormality identified. IMPRESSION: 1. Confluent left lung opacity compatible with aspiration and/or pneumonia in this clinical setting. No associated pleural effusion. 2. Mild cardiomegaly.  Calcified aortic atherosclerosis. Electronically Signed   By: Genevie Ann M.D.   On: 05/08/2016 09:42     IMPRESSION AND PLAN:   76 year old male with past history of chronic atrial fibrillation, hypertension, hyperlipidemia, diabetes, history of coronary artery disease, history of congestive heart failure, BPH who presents to the hospital due to a fall and weakness and noted to be hypotensive.  1. Sepsis-patient needs criteria given his leukopenia, tachycardia, chest x-ray findings suggestive of left lower lobe pneumonia. -I will treat the patient with IV fluids, place patient on broad-spectrum IV antibiotics with vancomycin, cefepime. Follow blood, sputum cultures. Will hemodynamics.  2. Pneumonia- source of patient's sepsis. -We'll treat with broad-spectrum IV antibiotics with vancomycin, cefepime. -Follow blood, sputum cultures.  3. Atrial fibrillation with rapid ventricular response-patient is noted to be in rapid A. fib with heart rates in the 140s. -Due to his hypotension from sepsis. ?? Need for Amio (vs) Dig. Load but pt. Has ARF.  Will replace POtassium as that may help.  [facility] consult cardiology, check a two-dimensional echocardiogram, cycle cardiac markers. We'll resume rate controlling meds once his hemodynamics improved. -Continue Pradaxa.  4. Elevated Trop - due to supply demand ischemia from A. Fib w/ RVr.  - tele, cycle markers, Echo.   5. Hypokalemia - will replace and repeat in a.m.  - check  Mg. Level.   6. Diabetes type 2 without complication-hold metformin, Amaryl right for now. We'll place on sliding scale insulin.  7. Hyperlipidemia-continue simvastatin.  8. Essential hypertension-hold all anti-hypertensives given the patient's relative hypotension from sepsis.  9. BPH-continue Flomax.    All the records are reviewed and case discussed with ED provider. Management plans discussed with the patient, family and they are in agreement.  CODE STATUS: Full  TOTAL TIME TAKING CARE OF THIS PATIENT: 45 minutes.    [name] M.D on 05/08/2016 at 11:40 AM  Between 7am to 6pm - Pager - [phone]  After 6pm go to [email or web address] - password [email or web address] [facility]  Office  [phone]  CC: Primary care physician; [name], MD

## 2016-05-08 NOTE — Consult Note (Signed)
Cardiology Consultation Note  Patient ID: Todd Valentine, MRN: [id number], DOB/AGE: 06/01/1940 76 y.o. Admit date: 05/08/2016   Date of Consult: 05/08/2016 Primary Physician: [name], MD Primary Cardiologist: Dr. Rockey Situ, MD Requesting Physician: Dr. Verdell Carmine, MD  Chief Complaint: Nausea/vomiting/diarrhea/fever/chills Reason for Consult: Afib with RVR  HPI: 76 y.o. male with h/o nonobstructive CAD by cardiac cath on 10/06/2013 as below, chronic A. fib on Pradaxa transitioning to Eliquis when finishes current prescription of Pradaxa, chronic diastolic CHF, hypertension, type 2 diabetes, prior long history of tobacco abuse for 30-40 years, occasional alcohol abuse, obesity, sleep apnea on CPAP who presented to [facility] on 3/15 with a one-day history of nausea, vomiting, diarrhea found to be septic secondary to likely aspiration pneumonia as well as in A. fib with RVR with heart rates into the 150s bpm.  Prior hospital admission and 09/2013 for increased shortness of breath associated with acute on chronic diastolic heart failure as well as A. fib with RVR. Echo at that time showed EF 55-60%, mildly dilated left atrium, mild-to-moderate MR/TR. Patient's troponins remained negative during that admission. He did undergo left heart catheterization on 10/06/2013 that showed nonobstructive disease detailed as below. Symptoms improved with diuresis. Patient was most recently seen by Dr. Rockey Situ on 04/25/2016 and was doing reasonably well at that time, though was sad secondary to recent passing of his wife. It was noted he had been undergoing decrease and carvedilol dosing secondary to fatigue and bradycardia.  Patient woke in his usual state of health on 3/14, and went to work as usual. While at work patient had several episodes of loose watery stools. This progressed to generalized weakness, subjective fever, and chills. He left work early in the afternoon around 2 PM. Upon arriving at his house he had  several episodes of vomiting. He contacted his daughter who came by the house and reported patient felt warm to her and left to go to [facility] to pick up a thermometer as well as Tylenol. Upon returning to the house from [place] patient reported to his daughter he had vomited several more times and was feeling very weak. He was taken to urgent care where he was noted to be afebrile, CBC with white blood cell count 8.8, hemoglobin 13.9, platelet count 111. He was given IV fluids and Phenergan with improvement and vomiting. He was given the option of going to the ER for further evaluation or going home and pushing oral fluids patient chose to go home. Upon his arrival back home in the evening on 3/14 he did reasonably well. Patient's daughter stayed with him overnight and reported she heard him Joneen Caraway a few times over night though did not see her him go to the restroom until approximately 5 AM. Patient reported at that time he had further episodes of vomiting. He was ushered back to the bed. Approximately around 8 AM patient was up again going to the bathroom to vomit. Patient's daughter was in the kitchen preparing breakfast. Patient's daughter heard a loud blood and ran to the bathroom to find the patient laying face down on the ground. He was responsive. He denied any complaints of chest pain, shortness of breath, palpitations, or diaphoresis. Patient had just vomited prior to this episode. Patient's daughter got him up and placed him in the bed. EMS was contacted for further evaluation.  Upon the patient's arrival to [facility] they were found to have an initial BP of 107/71, though BP did trend as low as 21J systolic in  the ED. Heart rate was noted to be tachycardic at 144 bpm upon admission trending up to 170s bpm rhythm atrial fibrillation with RVR temperature 97.69F, oxygen saturation 97% on room air. Initial labs show troponin 0.42, lactic acid 3.9, potassium 2.8, serum creatinine 1.8 with a baseline of  approximately 1.1-1.2, albumin 3.0, total bilirubin 3.2, magnesium 1.3, WBC 2.8, hemoglobin 13.5, platelet count 96. ECG showed A. fib with RVR, 144 bpm, bifascicular block, occasional PVC, nonspecific ST-T changes, CXR showed confluent left lung opacity consistent with aspiration pneumonia. Head CT negative for acute intracranial process. In the ED patient remained in A. fib with RVR with heart rates ranging from the 130 bpm to 170 bpm. He was started on IV Zosyn as well as vancomycin by internal medicine. Patient did miss both doses of Pradaxa on 3/14. He currently denies any chest pain, shortness of breath, palpitations, nausea, abdominal pain, dizziness, diaphoresis. In the ED he has not received any rate controlling medications at time of cardiology consult.  Past Medical History:  Diagnosis Date  . Acute renal failure ([place])   . BPH (benign prostatic hyperplasia)   . Chronic atrial fibrillation ([place])   . Chronic diastolic CHF (congestive heart failure) ([name])    a. echo as above  . Chronic insomnia   . Coronary artery disease, non-occlusive 10/06/2013   a. cath 09/2013: pLAD 40, CTO Diag, ramus 40-->80, OM 50 mRCA 40, med rx  . Depression   . Diabetes mellitus with complication ([place])   . History of diverticulitis   . Hyperkalemia   . Hyperlipidemia   . Hypertension   . Malignant melanoma ([place])   . Mitral regurgitation    a. echo 09/2013: EF 55-60%, mildly dilated left atrrium, mild to moderate MR/TR      Most Recent Cardiac Studies: Cardiac cath 10/06/2013:  occluded diagonal vessel, 40% proximal LAD disease, bifurcating marginal branch/ramus. Ramus had 60% followed by 80% disease, OM with 50% mid disease at least. RCA with 40% mid disease. Medical management recommended  TTE 10/02/2013:  ejection fraction 55-60%, mildly dilated left atrium, mild to moderate MR and TR   Surgical History:  Past Surgical History:  Procedure Laterality Date  . APPENDECTOMY    . CARDIAC CATHETERIZATION   10/06/2013  . COLONOSCOPY    . COLONOSCOPY WITH PROPOFOL N/A 11/23/2014   Procedure: COLONOSCOPY WITH PROPOFOL;  Surgeon: Manya Silvas, MD;  Location: [facility] ENDOSCOPY;  Service: Endoscopy;  Laterality: N/A;  . MOHS SURGERY    . removal tubular adenoma       Home Meds: Prior to Admission medications   Medication Sig Start Date End Date Taking? Authorizing Provider  acetaminophen (TYLENOL) 500 MG tablet Take 1,000 mg by mouth every 6 (six) hours as needed.   Yes Historical Provider, MD  carvedilol (COREG) 6.25 MG tablet Take 1 tablet (6.25 mg total) by mouth 2 (two) times daily with a meal. 04/25/16  Yes Minna Merritts, MD  diltiazem (DILACOR XR) 120 MG 24 hr capsule TAKE ONE CAPSULE BY MOUTH ONCE DAILY 06/19/15  Yes Minna Merritts, MD  glimepiride (AMARYL) 4 MG tablet Take 4 mg by mouth 2 (two) times daily.    Yes Historical Provider, MD  lisinopril-hydrochlorothiazide (PRINZIDE,ZESTORETIC) 20-12.5 MG per tablet Take 1 tablet by mouth daily. 04/24/14  Yes Minna Merritts, MD  magnesium oxide (MAG-OX) 400 MG tablet Take 400 mg by mouth daily.   Yes Historical Provider, MD  metformin (FORTAMET) 1000 MG (OSM) 24 hr tablet Take  1,000 mg by mouth 2 (two) times daily with a meal.  12/18/10  Yes Historical Provider, MD  ondansetron (ZOFRAN) 4 MG tablet Take 1 tablet (4 mg total) by mouth every 8 (eight) hours as needed for nausea or vomiting. 05/07/16  Yes Harrie Foreman, MD  PRADAXA 150 MG CAPS capsule TAKE ONE CAPSULE BY MOUTH TWICE DAILY 04/02/16  Yes Minna Merritts, MD  simvastatin (ZOCOR) 40 MG tablet TAKE ONE TABLET BY MOUTH AT BEDTIME 03/27/16  Yes Minna Merritts, MD  tamsulosin (FLOMAX) 0.4 MG CAPS capsule Take 0.4 mg by mouth daily.  12/10/13  Yes Historical Provider, MD  traZODone (DESYREL) 50 MG tablet Take 50 mg by mouth at bedtime as needed for sleep.   Yes Historical Provider, MD  apixaban (ELIQUIS) 5 MG TABS tablet Take 1 tablet (5 mg total) by mouth 2 (two) times daily. 04/25/16    Minna Merritts, MD  furosemide (LASIX) 20 MG tablet TAKE ONE TABLET BY MOUTH TWICE DAILY AS NEEDED 02/19/16   Minna Merritts, MD  nitroGLYCERIN (NITROSTAT) 0.4 MG SL tablet Place 1 tablet (0.4 mg total) under the tongue every 5 (five) minutes as needed for chest pain. 01/05/14   Minna Merritts, MD  potassium chloride (K-DUR) 10 MEQ tablet TAKE ONE TABLET BY MOUTH TWICE DAILY AS NEEDED 02/19/16   Minna Merritts, MD    Inpatient Medications:  . heparin  4,500 Units Intravenous Once  . insulin aspart  0-5 Units Subcutaneous QHS  . insulin aspart  0-9 Units Subcutaneous TID WC  . potassium chloride (KCL MULTIRUN) 30 mEq in 265 mL IVPB  30 mEq Intravenous Once   . amiodarone 60 mg/hr (05/08/16 1340)   Followed by  . amiodarone    . ceFEPIme    . heparin    . magnesium sulfate 1 - 4 g bolus IVPB    . vancomycin      Allergies:  Allergies  Allergen Reactions  . Ciprofloxacin     Other reaction(s): Other (See Comments) Pt states he could hardly move  . Sitagliptin     Other reaction(s): Other (See Comments) Weakness    Social History   Social History  . Marital status: Married    Spouse name: N/A  . Number of children: N/A  . Years of education: N/A   Occupational History  . Not on file.   Social History Main Topics  . Smoking status: Former Research scientist (life sciences)  . Smokeless tobacco: Never Used  . Alcohol use Yes  . Drug use: No  . Sexual activity: Not on file   Other Topics Concern  . Not on file   Social History Narrative  . No narrative on file     Family History  Problem Relation Age of Onset  . Family history unknown: Yes     Review of Systems: Review of Systems  Constitutional: Positive for chills, fever and malaise/fatigue. Negative for diaphoresis and weight loss.  HENT: Negative for congestion.   Eyes: Negative for discharge and redness.  Respiratory: Positive for cough and shortness of breath. Negative for hemoptysis, sputum production and wheezing.     Cardiovascular: Negative for chest pain, palpitations, orthopnea, claudication, leg swelling and PND.  Gastrointestinal: Positive for diarrhea, nausea and vomiting. Negative for abdominal pain, blood in stool, constipation, heartburn and melena.  Genitourinary: Negative for hematuria.  Musculoskeletal: Negative for falls and myalgias.  Skin: Negative for rash.  Neurological: Positive for weakness. Negative for dizziness, tingling, tremors, sensory  change, speech change, focal weakness, loss of consciousness and headaches.  Endo/Heme/Allergies: Does not bruise/bleed easily.  Psychiatric/Behavioral: Negative for substance abuse. The patient is not nervous/anxious.   All other systems reviewed and are negative.   Labs:  Recent Labs  05/08/16 1101  TROPONINI 0.42*   Lab Results  Component Value Date   WBC 2.8 (L) 05/08/2016   HGB 13.5 05/08/2016   HCT 40.1 05/08/2016   MCV 89.5 05/08/2016   PLT 96 (L) 05/08/2016     Recent Labs Lab 05/08/16 1101  NA 138  K 2.8*  CL 106  CO2 23  BUN 24*  CREATININE 1.80*  CALCIUM 7.9*  PROT 5.9*  BILITOT 3.2*  ALKPHOS 42  ALT 24  AST 43*  GLUCOSE 119*   Lab Results  Component Value Date   CHOL 119 02/28/2014   HDL 44 02/28/2014   LDLCALC 55 02/28/2014   TRIG 99 02/28/2014   No results found for: DDIMER  Radiology/Studies:  Ct Head Wo Contrast  Result Date: 05/08/2016 CLINICAL DATA:  Unwitnessed fall.  Head abrasion. EXAM: CT HEAD WITHOUT CONTRAST CT CERVICAL SPINE WITHOUT CONTRAST TECHNIQUE: Multidetector CT imaging of the head and cervical spine was performed following the standard protocol without intravenous contrast. Multiplanar CT image reconstructions of the cervical spine were also generated. COMPARISON:  None. FINDINGS: CT HEAD FINDINGS Brain: No evidence of acute infarction, hemorrhage, hydrocephalus, extra-axial collection or mass lesion/mass effect. Moderate low-density in the cerebral white matter attributed to chronic  microvascular ischemia. Vascular: Atherosclerotic calcification. Skull: Bubbles of gas in the right infratemporal and temporal fossa is intravenous. No fracture noted. Sinuses/Orbits: Generalized mild mucosal thickening. CT CERVICAL SPINE FINDINGS Alignment: No traumatic malalignment. Facet mediated anterolisthesis at C4-5, C5-6, C6-7, and C7-T1. Skull base and vertebrae: Negative for acute fracture. Soft tissues and spinal canal: No prevertebral fluid or swelling. No visible canal hematoma. Disc levels: Diffuse advanced facet arthropathy bulky spurring and multilevel listhesis. Milder disc degeneration. Advanced atlantodental degeneration with anterior arch thinning. Upper chest: No acute finding IMPRESSION: 1. No evidence of intracranial or cervical spine injury. 2. Advanced cervical spine degeneration. 3. Chronic microvascular disease in the cerebral white matter. Electronically Signed   By: [name]   On: 05/08/2016 09:42   Ct Cervical Spine Wo Contrast  Result Date: 05/08/2016 CLINICAL DATA:  Unwitnessed fall.  Head abrasion. EXAM: CT HEAD WITHOUT CONTRAST CT CERVICAL SPINE WITHOUT CONTRAST TECHNIQUE: Multidetector CT imaging of the head and cervical spine was performed following the standard protocol without intravenous contrast. Multiplanar CT image reconstructions of the cervical spine were also generated. COMPARISON:  None. FINDINGS: CT HEAD FINDINGS Brain: No evidence of acute infarction, hemorrhage, hydrocephalus, extra-axial collection or mass lesion/mass effect. Moderate low-density in the cerebral white matter attributed to chronic microvascular ischemia. Vascular: Atherosclerotic calcification. Skull: Bubbles of gas in the right infratemporal and temporal fossa is intravenous. No fracture noted. Sinuses/Orbits: Generalized mild mucosal thickening. CT CERVICAL SPINE FINDINGS Alignment: No traumatic malalignment. Facet mediated anterolisthesis at C4-5, C5-6, C6-7, and C7-T1. Skull base  and vertebrae: Negative for acute fracture. Soft tissues and spinal canal: No prevertebral fluid or swelling. No visible canal hematoma. Disc levels: Diffuse advanced facet arthropathy bulky spurring and multilevel listhesis. Milder disc degeneration. Advanced atlantodental degeneration with anterior arch thinning. Upper chest: No acute finding IMPRESSION: 1. No evidence of intracranial or cervical spine injury. 2. Advanced cervical spine degeneration. 3. Chronic microvascular disease in the cerebral white matter. Electronically Signed   By: Monte Fantasia  M.D.   On: 05/08/2016 09:42   Dg Chest Port 1 View  Result Date: 05/08/2016 CLINICAL DATA:  76 year old male with vomiting since 1815 hours yesterday. Syncope this morning. Initial encounter. EXAM: PORTABLE CHEST 1 VIEW COMPARISON:  None. FINDINGS: Portable AP semi upright view at 0915 hours. Confluent abnormal pulmonary opacity throughout the mid and lower left lung, sparing the left costophrenic angle. No definite pleural effusion. Stable cardiomegaly and mediastinal contours. Calcified aortic atherosclerosis. Visualized tracheal air column is within normal limits. The right lung appears clear when allowing for portable technique. No pneumothorax. Negative visible bowel gas pattern. No acute osseous abnormality identified. IMPRESSION: 1. Confluent left lung opacity compatible with aspiration and/or pneumonia in this clinical setting. No associated pleural effusion. 2. Mild cardiomegaly.  Calcified aortic atherosclerosis. Electronically Signed   By: Genevie Ann M.D.   On: 05/08/2016 09:42    EKG: Interpreted by me showed: Afib with RVR, 142 bpm, bifascicular block, occasional PVC, nonspecific st/t changes Telemetry: Interpreted by me showed: Afib with RVR, 130s-170s bpm  Weights: Filed Weights   05/08/16 0906  Weight: 210 lb (95.3 kg)     Physical Exam: Blood pressure 107/71, pulse (!) 144, temperature 97.4 F (36.3 C), temperature source Oral,  resp. rate 15, height 5\' 9"  (1.753 m), weight 210 lb (95.3 kg), SpO2 99 %. Body mass index is 31.01 kg/m. General: Well developed, well nourished, in no acute distress. Head: Normocephalic, skin tear noted along forehead, sclera non-icteric, no xanthomas, nares are without discharge.  Neck: Negative for carotid bruits. JVD not elevated. Lungs: Diminished breath sounds bilaterally with diffuse rhonchi L>R. Breathing is unlabored on nasal cannula.  Heart: Tachycardic, irregularly irregular with S1 S2. No murmurs, rubs, or gallops appreciated. Abdomen: Soft, non-tender, non-distended with normoactive bowel sounds. No hepatomegaly. No rebound/guarding. No obvious abdominal masses. Msk:  Strength and tone appear normal for age. Extremities: No clubbing or cyanosis. No edema. Distal pedal pulses are 2+ and equal bilaterally. Neuro: Alert and oriented X 3. No facial asymmetry. No focal deficit. Moves all extremities spontaneously. Psych:  Responds to questions appropriately with a normal affect.    Assessment and Plan:  Principal Problem:   Sepsis ([place]) Active Problems:   Chronic atrial fibrillation ([name])   Aspiration pneumonia ([name])   Atrial fibrillation with RVR ([name])   Hypokalemia   Gastroenteritis   AKI (acute kidney injury) ([place])   Hypomagnesemia   Leukopenia   Coronary artery disease involving native coronary artery of native heart without angina pectoris    1. Chronic A. fib with RVR: -Likely in the setting of the patient's sepsis secondary to aspiration pneumonia, gastroenteritis, severe hypokalemia, and severe hypomagnesemia  -Recommend rate control in the setting of the above with aggressive correction of electrolyte abnormalities and treatment of underlying process -Soft blood pressure precludes ability to use beta blocker or calcium channel blocker at this time. Acute kidney injury with serum creatinine of 1.8 precludes use of digoxin at this time -Will place patient on  amiodarone infusion with bolus being infused over 60 minutes in an effort to prevent hypotension -Placed on heparin drip at this time pending any possible invasive evaluation needed  -As patient's electrolyte abnormalities and underlying condition improved heart rate should improve as well -CHADS2VASc at least 6 (CHF, HTN, age x 2, DM, vascular disease) -Upon patient's discharge would place patient on Eliquis as he was transitioning to this as an outpatient prior to this admission  2. Septic shock in the setting of aspiration pneumonia: -  Maintain MAP greater than 65 mmHg -Consider influenza nasopharyngeal swab given leukopenia, defer to internal medicine -Antibiotics per IM  3. Gastroenteritis: -Supportive care via IV fluids and anti-emetics  4. Hypokalemia: -Replete to goal 4.0, has received KCl per internal medicine at time of admission  5. Hypomagnesemia: -Replete to goal 2.0, via IV  6. Leukopenia/thrombocytopenia: -Monitor on heparin drip -Consider nasopharyngeal swab for influenza as above, defer to IM  7. Nonocclusive CAD/elevated troponin: -No symptoms of chest pain at this time -Initial troponin elevated at 0.4, likely in the setting of supply demand ischemia secondary to patient's gastroenteritis, pneumonia, A. fib with RVR, and acute renal failure -Continue to cycle troponin through peak and plateau -Heparin drip as above -Beta blocker on hold secondary to hypotension in the setting of septic shock -Statin -No ASA in the setting of long-term full dose anticoagulation usage  8. A chaotic: -Likely ATN in the setting of patient's dehydration secondary to vomiting and diarrhea -Gentle IV fluids -Monitor renal function   Signed, Christell Faith, PA-C [place] Pager: [phone] 05/08/2016, 1:41 PM

## 2016-05-08 NOTE — Progress Notes (Signed)
ANTICOAGULATION CONSULT NOTE - Initial Consult  Pharmacy Consult for heparin drip  Indication: atrial fibrillation  Allergies  Allergen Reactions  . Ciprofloxacin     Other reaction(s): Other (See Comments) Pt states he could hardly move  . Sitagliptin     Other reaction(s): Other (See Comments) Weakness    Patient Measurements: Height: 5\' 9"  (175.3 cm) Weight: 210 lb (95.3 kg) IBW/kg (Calculated) : 70.7 Heparin Dosing Weight: 90kg  Vital Signs: Temp: 97.4 F (36.3 C) (03/15 0905) Temp Source: Oral (03/15 0905) BP: 107/71 (03/15 0905) Pulse Rate: 144 (03/15 0905)  Labs:  Recent Labs  05/07/16 2006 05/08/16 0952 05/08/16 1101  HGB 13.9 13.5  --   HCT 41.4 40.1  --   PLT 111* 96*  --   CREATININE  --   --  1.80*  TROPONINI  --   --  0.42*    Estimated Creatinine Clearance: 40.4 mL/min (A) (by C-G formula based on SCr of 1.8 mg/dL (H)).   Medical History: Past Medical History:  Diagnosis Date  . A-fib ([place])   . Acute renal failure ([place])   . BPH (benign prostatic hyperplasia)   . CHF (congestive heart failure) ([place])   . Chronic insomnia   . Coronary artery disease 10/06/2013   [facility]  . Depression   . Diabetes mellitus without complication ([place])   . History of diverticulitis   . Hyperkalemia   . Hyperlipidemia   . Hypertension   . Malignant melanoma ([place])   . Systolic CHF ([place])   . Systolic murmur     Assessment: 76 yo male with PMH of A. Fib. Patient has been taking Dabigatran BID at home until he runs out of current prescription- then plan was for him to start apixaban 5mg  BID. Patient dose not know when he took his last dose of Dabigatran, but guesses it was probably on 3/13 at some point.   Goal of Therapy:  Heparin level 0.3-0.7 units/ml aPTT 66-102 seconds Monitor platelets by anticoagulation protocol: Yes   Plan:  Baseline labs ordered. Since patient reports last dose of Dabigatran >24hrs- will start  Give 4500 units bolus x 1 Start  heparin infusion at 1300 units/hr Check anti-Xa level in 8 hours and daily while on heparin Continue to monitor H&H and platelets  Pernell Dupre, PharmD, BCPS Clinical Pharmacist 05/08/2016 1:36 PM

## 2016-05-08 NOTE — Progress Notes (Signed)
Pharmacy Antibiotic Note  QUINTON VOTH is a 76 y.o. male admitted on 05/08/2016 with sepsis secondary to  pneumonia.  Pharmacy has been consulted for cefepime and vancomycin dosing. Patient received vancomycin 1gm IV x 1 dose and Zosyn 3.375 IV x 2 dose in ED.   Plan: Ke: 0.038   Vd: 66.5   T1/2 18.24  Will start the patient on vancomycin 1250mg  every 24 hours with 6 hour stack dosing. Calculated trough at Css is 15. Will monitor renal function daily and adjust dose as needed. MRSA PCR ordered- recommend D/C of vancomycin if MRSA PCR is negative.   Will start the patient on Cefepime 2gm IV every 24 hours based on current CrCl <35ml.min.    Height: 5\' 9"  (175.3 cm) Weight: 210 lb (95.3 kg) IBW/kg (Calculated) : 70.7  Temp (24hrs), Avg:97.8 F (36.6 C), Min:97.4 F (36.3 C), Max:98.2 F (36.8 C)   Recent Labs Lab 05/07/16 2006 05/08/16 0952 05/08/16 1101  WBC 8.8 2.8*  --   CREATININE  --   --  1.80*  LATICACIDVEN  --  3.9*  --     Estimated Creatinine Clearance: 40.4 mL/min (A) (by C-G formula based on SCr of 1.8 mg/dL (H)).    Allergies  Allergen Reactions  . Ciprofloxacin     Other reaction(s): Other (See Comments) Pt states he could hardly move  . Sitagliptin     Other reaction(s): Other (See Comments) Weakness    Antimicrobials this admission: 3/15 Zosyn  >> x 1 dose  3/15 cefepime >> 3/15 vancomycin >>  Dose adjustments this admission:  Microbiology results: 3/15 BCx: sent 3/15  MRSA PCR:   Thank you for allowing pharmacy to be a part of this patient's care.  Pernell Dupre, PharmD, BCPS Clinical Pharmacist 05/08/2016 1:24 PM

## 2016-05-08 NOTE — Progress Notes (Signed)
[place] for electrolyte management   Pharmacy consulted for electrolyte management for 76 yo male admitted with PNA/Afib. Patient ordered potassium 9mEq PO x 1, potassium 59mEq IV x 1, magnesium 4g IV x 1, and LR/58mEq Potassium at 1105mL/hr.   Plan:  Will order phosphorus check. If > 2, will recheck all electrolytes with am labs.     Allergies  Allergen Reactions  . Ciprofloxacin     Other reaction(s): Other (See Comments) Pt states he could hardly move  . Sitagliptin     Other reaction(s): Other (See Comments) Weakness    Patient Measurements: Height: 5\' 9"  (175.3 cm) Weight: 207 lb 7.3 oz (94.1 kg) IBW/kg (Calculated) : 70.7   Vital Signs: Temp: 97.7 F (36.5 C) (03/15 1405) Temp Source: Oral (03/15 1405) BP: 129/70 (03/15 1405) Pulse Rate: 141 (03/15 1405) Intake/Output from previous day: No intake/output data recorded. Intake/Output from this shift: Total I/O In: 1222.5 [I.V.:172.5; IV Piggyback:1050] Out: -   Labs:  Recent Labs  05/07/16 2006 05/08/16 0952 05/08/16 1101  WBC 8.8 2.8*  --   HGB 13.9 13.5  --   HCT 41.4 40.1  --   PLT 111* 96*  --   CREATININE  --   --  1.80*  MG  --   --  1.3*  ALBUMIN  --   --  3.0*  PROT  --   --  5.9*  AST  --   --  43*  ALT  --   --  24  ALKPHOS  --   --  42  BILITOT  --   --  3.2*   Estimated Creatinine Clearance: 40.2 mL/min (A) (by C-G formula based on SCr of 1.8 mg/dL (H)).   Microbiology: No results found for this or any previous visit (from the past 720 hour(s)).  Medical History: Past Medical History:  Diagnosis Date  . Acute renal failure ([place])   . BPH (benign prostatic hyperplasia)   . Chronic atrial fibrillation ([place])   . Chronic diastolic CHF (congestive heart failure) ([name])    a. echo as above  . Chronic insomnia   . Coronary artery disease, non-occlusive 10/06/2013   a. cath 09/2013: pLAD 40, CTO Diag, ramus 40-->80, OM 50 mRCA 40, med rx  .  Depression   . Diabetes mellitus with complication ([place])   . History of diverticulitis   . Hyperkalemia   . Hyperlipidemia   . Hypertension   . Malignant melanoma ([place])   . Mitral regurgitation    a. echo 09/2013: EF 55-60%, mildly dilated left atrrium, mild to moderate MR/TR   Pharmacy will continue to monitor and adjust per consult.   Robinson Brinkley L 05/08/2016,3:44 PM

## 2016-05-08 NOTE — ED Notes (Signed)
Dr. Reita Cliche notified of critical trop of 0.42

## 2016-05-09 ENCOUNTER — Inpatient Hospital Stay: Payer: Medicare Other

## 2016-05-09 DIAGNOSIS — A419 Sepsis, unspecified organism: Principal | ICD-10-CM

## 2016-05-09 DIAGNOSIS — J690 Pneumonitis due to inhalation of food and vomit: Secondary | ICD-10-CM

## 2016-05-09 LAB — ECHOCARDIOGRAM COMPLETE
AO mean calculated velocity dopler: 145 cm/s
AV Area VTI index: 0.51 cm2/m2
AV Area VTI: 1.22 cm2
AV Area mean vel: 1.21 cm2
AV Mean grad: 9 mmHg
AV Peak grad: 15 mmHg
AV VEL mean LVOT/AV: 0.43
AV area mean vel ind: 0.56 cm2/m2
AV peak Index: 0.56
AV pk vel: 192 cm/s
AV vel: 1.11
Ao pk vel: 0.43 m/s
FS: 25 % — AB (ref 28–44)
Height: 69 in
IVS/LV PW RATIO, ED: 0.98
LA ID, A-P, ES: 45 mm
LA diam end sys: 45 mm
LA diam index: 2.08 cm/m2
LA vol A4C: 72 ml
LA vol index: 44.4 mL/m2
LA vol: 96.1 mL
LV PW d: 13.1 mm — AB (ref 0.6–1.1)
LVOT SV: 30 mL
LVOT VTI: 10.4 cm
LVOT area: 2.84 cm2
LVOT diameter: 19 mm
LVOT peak VTI: 0.39 cm
LVOT peak vel: 82.3 cm/s
VTI: 26.6 cm
Valve area index: 0.51
Valve area: 1.11 cm2
Weight: 3319.25 oz

## 2016-05-09 LAB — BLOOD GAS, ARTERIAL
Acid-base deficit: 7.5 mmol/L — ABNORMAL HIGH (ref 0.0–2.0)
Bicarbonate: 17.4 mmol/L — ABNORMAL LOW (ref 20.0–28.0)
Delivery systems: POSITIVE
Expiratory PAP: 6
FIO2: 0.5
Inspiratory PAP: 14
Mechanical Rate: 14
O2 Saturation: 99.1 %
Patient temperature: 37
pCO2 arterial: 33 mmHg (ref 32.0–48.0)
pH, Arterial: 7.33 — ABNORMAL LOW (ref 7.350–7.450)
pO2, Arterial: 143 mmHg — ABNORMAL HIGH (ref 83.0–108.0)

## 2016-05-09 LAB — BASIC METABOLIC PANEL
Anion gap: 17 — ABNORMAL HIGH (ref 5–15)
BUN: 37 mg/dL — ABNORMAL HIGH (ref 6–20)
CO2: 19 mmol/L — ABNORMAL LOW (ref 22–32)
Calcium: 7.9 mg/dL — ABNORMAL LOW (ref 8.9–10.3)
Chloride: 104 mmol/L (ref 101–111)
Creatinine, Ser: 2.53 mg/dL — ABNORMAL HIGH (ref 0.61–1.24)
GFR calc Af Amer: 27 mL/min — ABNORMAL LOW (ref >60)
GFR calc non Af Amer: 23 mL/min — ABNORMAL LOW (ref >60)
Glucose, Bld: 128 mg/dL — ABNORMAL HIGH (ref 65–99)
Potassium: 4.7 mmol/L (ref 3.5–5.1)
Sodium: 140 mmol/L (ref 135–145)

## 2016-05-09 LAB — CBC
HCT: 41.8 % (ref 40.0–52.0)
Hemoglobin: 13.9 g/dL (ref 13.0–18.0)
MCH: 30 pg (ref 26.0–34.0)
MCHC: 33.3 g/dL (ref 32.0–36.0)
MCV: 90 fL (ref 80.0–100.0)
Platelets: 105 10*3/uL — ABNORMAL LOW (ref 150–440)
RBC: 4.65 MIL/uL (ref 4.40–5.90)
RDW: 14 % (ref 11.5–14.5)
WBC: 6.6 10*3/uL (ref 3.8–10.6)

## 2016-05-09 LAB — TROPONIN I: Troponin I: 0.5 ng/mL (ref <0.03)

## 2016-05-09 LAB — GLUCOSE, CAPILLARY
Glucose-Capillary: 154 mg/dL — ABNORMAL HIGH (ref 65–99)
Glucose-Capillary: 187 mg/dL — ABNORMAL HIGH (ref 65–99)
Glucose-Capillary: 166 mg/dL — ABNORMAL HIGH (ref 65–99)
Glucose-Capillary: 172 mg/dL — ABNORMAL HIGH (ref 65–99)

## 2016-05-09 LAB — STREP PNEUMONIAE URINARY ANTIGEN: Strep Pneumo Urinary Antigen: NEGATIVE

## 2016-05-09 LAB — LACTIC ACID, PLASMA
Lactic Acid, Venous: 6.2 mmol/L (ref 0.5–1.9)
Lactic Acid, Venous: 2.1 mmol/L (ref 0.5–1.9)

## 2016-05-09 LAB — HEPARIN LEVEL (UNFRACTIONATED): Heparin Unfractionated: 0.48 IU/mL (ref 0.30–0.70)

## 2016-05-09 LAB — PROCALCITONIN: Procalcitonin: 105.47 ng/mL

## 2016-05-09 MED ORDER — DILTIAZEM LOAD VIA INFUSION
0.1000 mg | Freq: Once | INTRAVENOUS | Status: DC
  Filled 2016-05-09: qty 1

## 2016-05-09 MED ORDER — CHLORHEXIDINE GLUCONATE 0.12 % MT SOLN
15.0000 mL | Freq: Two times a day (BID) | OROMUCOSAL | Status: DC
  Administered 2016-05-09 – 2016-05-18 (×17): 15 mL via OROMUCOSAL
  Filled 2016-05-09 (×14): qty 15

## 2016-05-09 MED ORDER — DOXYCYCLINE HYCLATE 100 MG IV SOLR
100.0000 mg | Freq: Two times a day (BID) | INTRAVENOUS | Status: DC
  Administered 2016-05-09 – 2016-05-13 (×9): 100 mg via INTRAVENOUS
  Filled 2016-05-09 (×11): qty 100

## 2016-05-09 MED ORDER — DILTIAZEM HCL 100 MG IV SOLR
1.0000 mg/h | INTRAVENOUS | Status: DC
  Administered 2016-05-09: 1 mg/h via INTRAVENOUS
  Administered 2016-05-10: 5 mg/h via INTRAVENOUS
  Filled 2016-05-09 (×2): qty 100

## 2016-05-09 MED ORDER — FAMOTIDINE 20 MG PO TABS
20.0000 mg | ORAL_TABLET | Freq: Every day | ORAL | Status: DC
  Administered 2016-05-09 – 2016-05-18 (×10): 20 mg via ORAL
  Filled 2016-05-09 (×11): qty 1

## 2016-05-09 MED ORDER — ORAL CARE MOUTH RINSE
15.0000 mL | Freq: Two times a day (BID) | OROMUCOSAL | Status: DC
  Administered 2016-05-09 – 2016-05-19 (×14): 15 mL via OROMUCOSAL

## 2016-05-09 MED ORDER — DEXTROSE 5 % IV SOLN
2.0000 g | Freq: Every day | INTRAVENOUS | Status: DC
  Administered 2016-05-09: 2 g via INTRAVENOUS
  Filled 2016-05-09 (×2): qty 2

## 2016-05-09 NOTE — Progress Notes (Signed)
Name: Todd Valentine MRN: [id number] DOB: 11/14/1940    ADMISSION DATE:  05/08/2016 CONSULTATION DATE: 05/08/2016  REFERRING MD :  Dr. Verdell Carmine  CHIEF COMPLAINT: Loss of Consciousness   BRIEF PATIENT DESCRIPTION:  76 yo male with septic shock secondary to pneumonia, acute respiratory failure secondary to pneumonia and AECOPD, afibb with RVR, leukopenia/thrombocytopenia, hypokalemia, hypomagnesia, and gastroeteritis    SIGNIFICANT EVENTS  03/15-Pt admitted to [facility] stepdown unit due to afibb with RVR, acute respiratory failure, gastroenteritis, and septic shock PCCM consulted for additional management   STUDIES:  CT Cervical Spine and Head 03/15>>No evidence of intracranial or cervical spine injury. Advanced cervical spine degeneration. Chronic microvascular disease in the cerebral white matter CT Chest 03/15>>Extensive airspace consolidation within the LEFT upper and LEFT lower lobes consistent with pneumonia. Minimal patchy central RIGHT lower lobe infiltrates as well. Aortic atherosclerosis and coronary arterial calcification  REVIEW OF SYSTEMS:  Positives in BOLD Constitutional: fever, chills, weight loss, malaise/fatigue and diaphoresis.  HENT: Negative for hearing loss, ear pain, nosebleeds, congestion, sore throat, neck pain, tinnitus and ear discharge.   Eyes: Negative for blurred vision, double vision, photophobia, pain, discharge and redness.  Respiratory: cough, hemoptysis, sputum production, shortness of breath, wheezing and stridor.   Cardiovascular: Negative for chest pain, palpitations, orthopnea, claudication, leg swelling and PND.  Gastrointestinal: heartburn, nausea, vomiting, abdominal pain, diarrhea, constipation, blood in stool and melena.  Genitourinary: Negative for dysuria, urgency, frequency, hematuria and flank pain.  Musculoskeletal: Negative for myalgias, back pain, joint pain and falls.  Skin: Negative for itching and rash.  Neurological: Negative for  dizziness, tingling, tremors, sensory change, speech change, focal weakness, seizures, loss of consciousness, weakness and headaches.  Endo/Heme/Allergies: Negative for environmental allergies and polydipsia. Does not bruise/bleed easily.  SUBJECTIVE:  Pt on 50% Bipap stated he feels better on the Bipap he denies shortness of breath.  According to RN   VITAL SIGNS: Temp:  [97.7 F (36.5 C)-98.2 F (36.8 C)] 98.2 F (36.8 C) (03/16 0800) Pulse Rate:  [116-161] 155 (03/16 1000) Resp:  [22-37] 27 (03/16 1000) BP: (93-157)/(64-127) 131/69 (03/16 1000) SpO2:  [80 %-100 %] 100 % (03/16 1000) FiO2 (%):  [36 %-50 %] 50 % (03/16 0800) Weight:  [94.1 kg (207 lb 7.3 oz)] 94.1 kg (207 lb 7.3 oz) (03/15 1405)  PHYSICAL EXAMINATION: General: acutely ill appearing Caucasian elderly male Neuro: alert and oriented, follows commands  HEENT: supple, no JVD Cardiovascular: irregular, irregular, no M/R/G Lungs: rhonchi LLL and diminished throughout, even, non labored on Bipap Abdomen: obese, +BS x4, soft, non tender, non distended  Musculoskeletal: normal bulk and tone, no edema  Skin: laceration left forehead with ecchymosis    Recent Labs Lab 05/08/16 1101 05/08/16 1800 05/09/16 0247  NA 138  --  140  K 2.8* 4.0 4.7  CL 106  --  104  CO2 23  --  19*  BUN 24*  --  37*  CREATININE 1.80*  --  2.53*  GLUCOSE 119*  --  128*    Recent Labs Lab 05/07/16 2006 05/08/16 0952 05/09/16 0247  HGB 13.9 13.5 13.9  HCT 41.4 40.1 41.8  WBC 8.8 2.8* 6.6  PLT 111* 96* 105*   Ct Head Wo Contrast  Result Date: 05/08/2016 CLINICAL DATA:  Unwitnessed fall.  Head abrasion. EXAM: CT HEAD WITHOUT CONTRAST CT CERVICAL SPINE WITHOUT CONTRAST TECHNIQUE: Multidetector CT imaging of the head and cervical spine was performed following the standard protocol without intravenous contrast. Multiplanar CT image reconstructions  of the cervical spine were also generated. COMPARISON:  None. FINDINGS: CT HEAD FINDINGS  Brain: No evidence of acute infarction, hemorrhage, hydrocephalus, extra-axial collection or mass lesion/mass effect. Moderate low-density in the cerebral white matter attributed to chronic microvascular ischemia. Vascular: Atherosclerotic calcification. Skull: Bubbles of gas in the right infratemporal and temporal fossa is intravenous. No fracture noted. Sinuses/Orbits: Generalized mild mucosal thickening. CT CERVICAL SPINE FINDINGS Alignment: No traumatic malalignment. Facet mediated anterolisthesis at C4-5, C5-6, C6-7, and C7-T1. Skull base and vertebrae: Negative for acute fracture. Soft tissues and spinal canal: No prevertebral fluid or swelling. No visible canal hematoma. Disc levels: Diffuse advanced facet arthropathy bulky spurring and multilevel listhesis. Milder disc degeneration. Advanced atlantodental degeneration with anterior arch thinning. Upper chest: No acute finding IMPRESSION: 1. No evidence of intracranial or cervical spine injury. 2. Advanced cervical spine degeneration. 3. Chronic microvascular disease in the cerebral white matter. Electronically Signed   By: [name]   On: 05/08/2016 09:42   Ct Chest Wo Contrast  Result Date: 05/08/2016 CLINICAL DATA:  Sepsis, pneumonia, hypotension, rapid atrial fibrillation, history hypertension, melanoma, diabetes mellitus, CHF, former smoker EXAM: CT CHEST WITHOUT CONTRAST TECHNIQUE: Multidetector CT imaging of the chest was performed following the standard protocol without IV contrast. Sagittal and coronal MPR images reconstructed from axial data set. COMPARISON:  Chest radiograph 05/08/2016 FINDINGS: Cardiovascular: Atherosclerotic calcifications aorta, coronary arteries, and proximal great vessels. Upper normal caliber thoracic aorta without aneurysmal dilatation. No pericardial effusion. Mediastinum/Nodes: Scattered normal size lymph nodes at base of cervical region and retroclavicular. Probable segmental dilatation of the LEFT  subclavian vein versus less likely an adjacent mildly enlarged lymph node. Scattered normal sized mediastinal lymph nodes otherwise seen. Esophagus unremarkable. Lungs/Pleura: Extensive airspace consolidation LEFT upper and LEFT lower lobes consistent with pneumonia. Mild scattered central infiltrates in RIGHT lower lobe. Underlying emphysematous changes and mild central peribronchial thickening. No definite pulmonary mass or nodule no abnormalities in LEFT lung could be obscured by infiltrate. No pleural effusion or pneumothorax. Upper Abdomen: Beam hardening artifacts secondary to the patient's arms traverse the upper abdomen. No definite upper abdominal abnormalities. Musculoskeletal: No acute osseous findings. IMPRESSION: Extensive airspace consolidation within the LEFT upper and LEFT lower lobes consistent with pneumonia. Minimal patchy central RIGHT lower lobe infiltrates as well. Aortic atherosclerosis and coronary arterial calcification. Electronically Signed   By: Lavonia Dana M.D.   On: 05/08/2016 18:41   Ct Cervical Spine Wo Contrast  Result Date: 05/08/2016 CLINICAL DATA:  Unwitnessed fall.  Head abrasion. EXAM: CT HEAD WITHOUT CONTRAST CT CERVICAL SPINE WITHOUT CONTRAST TECHNIQUE: Multidetector CT imaging of the head and cervical spine was performed following the standard protocol without intravenous contrast. Multiplanar CT image reconstructions of the cervical spine were also generated. COMPARISON:  None. FINDINGS: CT HEAD FINDINGS Brain: No evidence of acute infarction, hemorrhage, hydrocephalus, extra-axial collection or mass lesion/mass effect. Moderate low-density in the cerebral white matter attributed to chronic microvascular ischemia. Vascular: Atherosclerotic calcification. Skull: Bubbles of gas in the right infratemporal and temporal fossa is intravenous. No fracture noted. Sinuses/Orbits: Generalized mild mucosal thickening. CT CERVICAL SPINE FINDINGS Alignment: No traumatic malalignment.  Facet mediated anterolisthesis at C4-5, C5-6, C6-7, and C7-T1. Skull base and vertebrae: Negative for acute fracture. Soft tissues and spinal canal: No prevertebral fluid or swelling. No visible canal hematoma. Disc levels: Diffuse advanced facet arthropathy bulky spurring and multilevel listhesis. Milder disc degeneration. Advanced atlantodental degeneration with anterior arch thinning. Upper chest: No acute finding IMPRESSION: 1. No evidence of intracranial  or cervical spine injury. 2. Advanced cervical spine degeneration. 3. Chronic microvascular disease in the cerebral white matter. Electronically Signed   By: [name]   On: 05/08/2016 09:42   Dg Chest Port 1 View  Result Date: 05/09/2016 CLINICAL DATA:  Pneumonia, respiratory failure, atrial fibrillation and congestive heart failure. EXAM: PORTABLE CHEST 1 VIEW COMPARISON:  Chest x-ray and CT studies on 05/08/2016 FINDINGS: Airspace consolidation within the left upper lobe and lower lobe is partially masked by an overlying temporary pacing pad. Overall distribution likely similar to the chest x-ray yesterday. No overt edema or pleural effusion identified. Stable cardiac enlargement. No pneumothorax. IMPRESSION: Stable overall distribution of left lung airspace consolidation. Appearance by chest x-ray is today partially masked by an overlying temporary pacing pad. Electronically Signed   By: Aletta Edouard M.D.   On: 05/09/2016 09:51   Dg Chest Port 1 View  Result Date: 05/08/2016 CLINICAL DATA:  76 year old male with vomiting since 1815 hours yesterday. Syncope this morning. Initial encounter. EXAM: PORTABLE CHEST 1 VIEW COMPARISON:  None. FINDINGS: Portable AP semi upright view at 0915 hours. Confluent abnormal pulmonary opacity throughout the mid and lower left lung, sparing the left costophrenic angle. No definite pleural effusion. Stable cardiomegaly and mediastinal contours. Calcified aortic atherosclerosis. Visualized tracheal air  column is within normal limits. The right lung appears clear when allowing for portable technique. No pneumothorax. Negative visible bowel gas pattern. No acute osseous abnormality identified. IMPRESSION: 1. Confluent left lung opacity compatible with aspiration and/or pneumonia in this clinical setting. No associated pleural effusion. 2. Mild cardiomegaly.  Calcified aortic atherosclerosis. Electronically Signed   By: Genevie Ann M.D.   On: 05/08/2016 09:42    ASSESSMENT / PLAN: Acute Respiratory Failure secondary to pneumonia and AECOPD (Former smoker has not been officially dx with COPD) Atrial Fibrillation with RVR  Acute Renal failure secondary to ATN from sepsis (baseline Cr 1.0) Metabolic/lactic acidosis  Elevated troponin's secondary demand ischemia due to acute respiratory failure and gastroenteritis  Gastroenteritis  Hypomagnesia-resolved  Hypokalemia-resolved  Leukopenia/thrombocytopenia Hx: Chronic diastolic CHF  P: Prn Bipap or Supplemental O2 to maintain O2 sats 90%-94% Continue Cefepime and Vancomycin, will add Azithromycin to cover atypicals Trend WBC and monitor fever curve Trend PCT's and lactic acid Follow cultures  Aggressive bronchodilator therapy IV steroid's   Repeat CXR in am 03/17 Continue Heparin and Amiodarone gtt per Cardiology recommendations Cardiology to add Cardizem gtt for better rate control  Cardiology and Nephrology consulted appreciate input  Per Nephrology if pt remains oliguric pt may need dialysis will continue Sodium Bicarb gtt for now  Trend BMP's Replace electrolytes as indicated  Trend CBC Monitor for s/sx of bleeding  Transfuse for hgb <7  -Spoke with pt and pts Daughter Butch Penny the pt states he WOULD NOT want mechanical intubation, however he would want CPR, pressors, administration of ACLS medications, defibrillation/cardioversion if he were to cardiac arrest.  He also stated he is only agreeable with dialysis for a short period of time 5-7  days Only  Marda Stalker, [place] Pager [phone] (please enter 7 digits) PCCM Consult Pager [phone] (please enter 7 digits)

## 2016-05-09 NOTE — Progress Notes (Signed)
New orders to change Diltiazem orders to 1-5 mg/ hr. Per Dr.Gollan. Orders to keep HR between 100-120 and to keep systolic BP between 732-202.  Orders read back and verified.  Will continue to assess.

## 2016-05-09 NOTE — Progress Notes (Signed)
ANTICOAGULATION CONSULT NOTE - Initial Consult  Pharmacy Consult for heparin drip  Indication: atrial fibrillation  Allergies  Allergen Reactions  . Ciprofloxacin     Other reaction(s): Other (See Comments) Pt states he could hardly move  . Sitagliptin     Other reaction(s): Other (See Comments) Weakness    Patient Measurements: Height: 5\' 9"  (175.3 cm) Weight: 207 lb 7.3 oz (94.1 kg) IBW/kg (Calculated) : 70.7 Heparin Dosing Weight: 90kg  Vital Signs: Temp: 98 F (36.7 C) (03/15 1945) Temp Source: Oral (03/15 1945) BP: 127/73 (03/15 1900) Pulse Rate: 141 (03/15 1900)  Labs:  Recent Labs  05/07/16 2006 05/08/16 0952  05/08/16 1101 05/08/16 1322 05/08/16 1425 05/08/16 1800 05/08/16 2253  HGB 13.9 13.5  --   --   --   --   --   --   HCT 41.4 40.1  --   --   --   --   --   --   PLT 111* 96*  --   --   --   --   --   --   APTT  --   --   --   --  51*  --   --   --   LABPROT  --   --   --   --  18.6*  --   --   --   INR  --   --   --   --  1.54  --   --   --   HEPARINUNFRC  --   --   --   --  <0.10*  --   --  0.51  CREATININE  --   --   --  1.80*  --   --   --   --   TROPONINI  --   --   < > 0.42*  --  0.48* 0.59* 0.69*  < > = values in this interval not displayed.  Estimated Creatinine Clearance: 40.2 mL/min (A) (by C-G formula based on SCr of 1.8 mg/dL (H)).   Medical History: Past Medical History:  Diagnosis Date  . Acute renal failure ([place])   . BPH (benign prostatic hyperplasia)   . Chronic atrial fibrillation ([place])   . Chronic diastolic CHF (congestive heart failure) ([name])    a. echo as above  . Chronic insomnia   . Coronary artery disease, non-occlusive 10/06/2013   a. cath 09/2013: pLAD 40, CTO Diag, ramus 40-->80, OM 50 mRCA 40, med rx  . Depression   . Diabetes mellitus with complication ([place])   . History of diverticulitis   . Hyperkalemia   . Hyperlipidemia   . Hypertension   . Malignant melanoma ([place])   . Mitral regurgitation    a. echo  09/2013: EF 55-60%, mildly dilated left atrrium, mild to moderate MR/TR    Assessment: 76 yo male with PMH of A. Fib. Patient has been taking Dabigatran BID at home until he runs out of current prescription- then plan was for him to start apixaban 5mg  BID. Patient dose not know when he took his last dose of Dabigatran, but guesses it was probably on 3/13 at some point.   Goal of Therapy:  Heparin level 0.3-0.7 units/ml aPTT 66-102 seconds Monitor platelets by anticoagulation protocol: Yes   Plan:  Continue heparin infusion at current rate and recheck a HL in 8 hours.   [place] Form, PharmD, BCPS Clinical Pharmacist 05/09/2016 12:33 AM

## 2016-05-09 NOTE — Progress Notes (Signed)
Critical value called by lab for Lactic acid of 6.2.  Made [place] NP aware.

## 2016-05-09 NOTE — Consult Note (Signed)
[facility] CONSULT NOTE    Date: 05/09/2016                  Patient Name:  Todd Valentine  MRN: [id number]  DOB: January 15, 1941  Age / Sex: 76 y.o., male         PCP: [name], MD                 Service Requesting Consult: Pulmonary/Critical Care                 Reason for Consult: Acute renal failure            History of Present Illness: Patient is a 76 y.o. male with a PMHx of Atrial fibrillation, BPH, congestive heart failure, coronary artery disease, depression, diabetes mellitus type 2, hypertension, hyperlipidemia, history of malignant melanoma who was admitted to [facility] on 05/08/2016 for evaluation of fall. Earlier this week the patient was in his usual state of health. He was at a conference in [place]. He subsequently began developing chills and also had a nonproductive cough. Thereafter he developed nausea and vomiting and was taken to urgent care where he received IV fluids as well as Phenergan. He then went home and experienced a fall and sustained a small laceration to his 4 head. Upon being brought here he was noted to be in atrial fibrillation with rapid ventricular response. CT chest was performed and revealed left-sided pneumonia. He also had elevated lactic acid level.  His most recent outpatient creatinine was 1.0 in January. Upon presentation creatinine was 1.8. This a.m. creatinine is up to 2.53.  Since admission urine output was 200 cc.   Medications: Outpatient medications: Prescriptions Prior to Admission  Medication Sig Dispense Refill Last Dose  . acetaminophen (TYLENOL) 500 MG tablet Take 1,000 mg by mouth every 6 (six) hours as needed.   05/08/2016 at 0800  . carvedilol (COREG) 6.25 MG tablet Take 1 tablet (6.25 mg total) by mouth 2 (two) times daily with a meal. 180 tablet 3 Past Week at 1800  . diltiazem (DILACOR XR) 120 MG 24 hr capsule TAKE ONE CAPSULE BY MOUTH ONCE DAILY 90 capsule 3 Past Week at 0800  . glimepiride  (AMARYL) 4 MG tablet Take 4 mg by mouth 2 (two) times daily.    Past Week at 1800  . lisinopril-hydrochlorothiazide (PRINZIDE,ZESTORETIC) 20-12.5 MG per tablet Take 1 tablet by mouth daily. 30 tablet 6 Past Week at 0800  . magnesium oxide (MAG-OX) 400 MG tablet Take 400 mg by mouth daily.   Past Week at 0800  . metformin (FORTAMET) 1000 MG (OSM) 24 hr tablet Take 1,000 mg by mouth 2 (two) times daily with a meal.    Past Week at 1800  . ondansetron (ZOFRAN) 4 MG tablet Take 1 tablet (4 mg total) by mouth every 8 (eight) hours as needed for nausea or vomiting. 20 tablet 0 05/08/2016 at 0800  . PRADAXA 150 MG CAPS capsule TAKE ONE CAPSULE BY MOUTH TWICE DAILY 180 capsule 3 Past Week at 1800  . simvastatin (ZOCOR) 40 MG tablet TAKE ONE TABLET BY MOUTH AT BEDTIME 90 tablet 3 05/07/2016 at 2000  . tamsulosin (FLOMAX) 0.4 MG CAPS capsule Take 0.4 mg by mouth daily.    Past Week at 0800  . traZODone (DESYREL) 50 MG tablet Take 50 mg by mouth at bedtime as needed for sleep.   05/07/2016 at 2000  . apixaban (ELIQUIS) 5 MG TABS tablet Take 1  tablet (5 mg total) by mouth 2 (two) times daily. 60 tablet 11   . furosemide (LASIX) 20 MG tablet TAKE ONE TABLET BY MOUTH TWICE DAILY AS NEEDED 180 tablet 3 prn at prn  . nitroGLYCERIN (NITROSTAT) 0.4 MG SL tablet Place 1 tablet (0.4 mg total) under the tongue every 5 (five) minutes as needed for chest pain. 25 tablet 3 prn at prn  . potassium chloride (K-DUR) 10 MEQ tablet TAKE ONE TABLET BY MOUTH TWICE DAILY AS NEEDED 180 tablet 3 prn at prn    Current medications: Current Facility-Administered Medications  Medication Dose Route Frequency Provider Last Rate Last Dose  . acetaminophen (TYLENOL) tablet 650 mg  650 mg Oral Q6H PRN [name], MD       Or  . acetaminophen (TYLENOL) suppository 650 mg  650 mg Rectal Q6H PRN [name], MD      . amiodarone (NEXTERONE PREMIX) 360-4.14 MG/200ML-% (1.8 mg/mL) IV infusion  30 mg/hr Intravenous Continuous Areta Haber  Dunn, PA-C 16.7 mL/hr at 05/09/16 0800 30 mg/hr at 05/09/16 0800  . budesonide (PULMICORT) nebulizer solution 0.5 mg  0.5 mg Nebulization BID [name], MD   0.5 mg at 05/09/16 0759  . carvedilol (COREG) tablet 6.25 mg  6.25 mg Oral BID WC Bincy S Varughese, NP   6.25 mg at 05/09/16 0752  . ceFEPIme (MAXIPIME) 2 GM / 82mL IVPB premix  2 g Intravenous Q24H Sheema M Hallaji, RPH   2 g at 05/08/16 1800  . chlorhexidine (PERIDEX) 0.12 % solution 15 mL  15 mL Mouth Rinse BID Srikar Sudini, MD      . diltiazem (DILACOR XR) 24 hr capsule 120 mg  120 mg Oral Daily Bincy S Varughese, NP      . DOPamine (INTROPIN) 3.2-5 MG/ML-% infusion           . guaiFENesin (MUCINEX) 12 hr tablet 600 mg  600 mg Oral BID Awilda Bill, NP   600 mg at 05/08/16 2140  . heparin ADULT infusion 100 units/mL (25000 units/214mL sodium chloride 0.45%)  1,300 Units/hr Intravenous Continuous Sheema M Hallaji, RPH 13 mL/hr at 05/09/16 0800 1,300 Units/hr at 05/09/16 0800  . insulin aspart (novoLOG) injection 0-5 Units  0-5 Units Subcutaneous QHS [name], MD      . insulin aspart (novoLOG) injection 0-9 Units  0-9 Units Subcutaneous TID WC [name], MD   2 Units at 05/09/16 [phone]  . ipratropium-albuterol (DUONEB) 0.5-2.5 (3) MG/3ML nebulizer solution 3 mL  3 mL Nebulization Q4H [name], MD   3 mL at 05/09/16 0759  . MEDLINE mouth rinse  15 mL Mouth Rinse q12n4p Srikar Sudini, MD      . methylPREDNISolone sodium succinate (SOLU-MEDROL) 40 mg/mL injection 40 mg  40 mg Intravenous Q12H [name], MD   40 mg at 05/09/16 0858  . ondansetron (ZOFRAN) tablet 4 mg  4 mg Oral Q6H PRN [name], MD       Or  . ondansetron (ZOFRAN) injection 4 mg  4 mg Intravenous Q6H PRN [name], MD      . rocuronium (ZEMURON) 50 MG/5ML injection           . simvastatin (ZOCOR) tablet 40 mg  40 mg Oral QHS [name], MD   40 mg at 05/08/16 2140  . sodium bicarbonate 150 mEq in dextrose 5 % 1,000 mL  infusion   Intravenous Continuous Bincy Jannetta Quint, NP  50 mL/hr at 05/09/16 0800    . sodium chloride flush (NS) 0.9 % injection 3 mL  3 mL Intravenous Q12H [name], MD   3 mL at 05/08/16 1437  . tamsulosin (FLOMAX) capsule 0.4 mg  0.4 mg Oral Daily [name], MD   0.4 mg at 05/08/16 1732  . traZODone (DESYREL) tablet 50 mg  50 mg Oral QHS PRN [name], MD   50 mg at 05/08/16 2140  . vancomycin (VANCOCIN) 1,250 mg in sodium chloride 0.9 % 250 mL IVPB  1,250 mg Intravenous Q24H Sheema M Hallaji, RPH   1,250 mg at 05/08/16 2006      Allergies: Allergies  Allergen Reactions  . Ciprofloxacin     Other reaction(s): Other (See Comments) Pt states he could hardly move  . Sitagliptin     Other reaction(s): Other (See Comments) Weakness      Past Medical History: Past Medical History:  Diagnosis Date  . Acute renal failure ([place])   . BPH (benign prostatic hyperplasia)   . Chronic atrial fibrillation ([place])   . Chronic diastolic CHF (congestive heart failure) ([name])    a. echo as above  . Chronic insomnia   . Coronary artery disease, non-occlusive 10/06/2013   a. cath 09/2013: pLAD 40, CTO Diag, ramus 40-->80, OM 50 mRCA 40, med rx  . Depression   . Diabetes mellitus with complication ([place])   . History of diverticulitis   . Hyperkalemia   . Hyperlipidemia   . Hypertension   . Malignant melanoma ([place])   . Mitral regurgitation    a. echo 09/2013: EF 55-60%, mildly dilated left atrrium, mild to moderate MR/TR     Past Surgical History: Past Surgical History:  Procedure Laterality Date  . APPENDECTOMY    . CARDIAC CATHETERIZATION  10/06/2013  . COLONOSCOPY    . COLONOSCOPY WITH PROPOFOL N/A 11/23/2014   Procedure: COLONOSCOPY WITH PROPOFOL;  Surgeon: Manya Silvas, MD;  [facility] ENDOSCOPY;  Service: Endoscopy;  Laterality: N/A;  . MOHS SURGERY    . removal tubular adenoma       Family History: Family History  Problem Relation Age of Onset  .  Family history unknown: Yes     Social History: Social History   Social History  . Marital status: Married    Spouse name: N/A  . Number of children: N/A  . Years of education: N/A   Occupational History  . Not on file.   Social History Main Topics  . Smoking status: Former Research scientist (life sciences)  . Smokeless tobacco: Never Used  . Alcohol use Yes  . Drug use: No  . Sexual activity: Not on file   Other Topics Concern  . Not on file   Social History Narrative  . No narrative on file     Review of Systems: As per HPI  Vital Signs: Blood pressure (!) 142/127, pulse (!) 146, temperature 98.2 F (36.8 C), temperature source Axillary, resp. rate (!) 33, height 5\' 9"  (1.753 m), weight 94.1 kg (207 lb 7.3 oz), SpO2 97 %.  Weight trends: Filed Weights   05/08/16 0906 05/08/16 1405  Weight: 95.3 kg (210 lb) 94.1 kg (207 lb 7.3 oz)    Physical Exam: General: Critically ill appearing, on bipap  Head: Laceration on forehead  Eyes: Anicteric, EOMI  Nose: bipap facemask overlying nose  Throat: Bipap facemask on   Neck: Supple, trachea midline.  Lungs:  Left sided rales, increased work of breathing on bipap  Heart: S1S2 irregular tachycardic  Abdomen:  BS normoactive. Soft, Nondistended, non-tender.  No masses or organomegaly.  Extremities: No pretibial edema.  Neurologic: Awake, alert, following commands  Skin: No visible rashes, scars.    Lab results: Basic Metabolic Panel:  Recent Labs Lab 05/08/16 1101 05/08/16 1800 05/09/16 0247  NA 138  --  140  K 2.8* 4.0 4.7  CL 106  --  104  CO2 23  --  19*  GLUCOSE 119*  --  128*  BUN 24*  --  37*  CREATININE 1.80*  --  2.53*  CALCIUM 7.9*  --  7.9*  MG 1.3* 2.4  --   PHOS  --  2.9  --     Liver Function Tests:  Recent Labs Lab 05/08/16 1101  AST 43*  ALT 24  ALKPHOS 42  BILITOT 3.2*  PROT 5.9*  ALBUMIN 3.0*   No results for input(s): LIPASE, AMYLASE in the last 168 hours. No results for input(s): AMMONIA in the  last 168 hours.  CBC:  Recent Labs Lab 05/07/16 2006 05/08/16 0952 05/09/16 0247  WBC 8.8 2.8* 6.6  NEUTROABS 8.0* 2.4  --   HGB 13.9 13.5 13.9  HCT 41.4 40.1 41.8  MCV 88.1 89.5 90.0  PLT 111* 96* 105*    Cardiac Enzymes:  Recent Labs Lab 05/08/16 1101 05/08/16 1425 05/08/16 1800 05/08/16 2253  TROPONINI 0.42* 0.48* 0.59* 0.69*    BNP: Invalid input(s): POCBNP  CBG:  Recent Labs Lab 05/08/16 1420 05/08/16 1756 05/08/16 2106 05/09/16 0710  GLUCAP 163* 125* 112* 154*    Microbiology: Results for orders placed or performed during the hospital encounter of 05/08/16  Culture, blood (routine x 2)     Status: None (Preliminary result)   Collection Time: 05/08/16  9:53 AM  Result Value Ref Range Status   Specimen Description BLOOD L ARM  Final   Special Requests BOTTLES DRAWN AEROBIC AND ANAEROBIC BCAV  Final   Culture NO GROWTH < 24 HOURS  Final   Report Status PENDING  Incomplete  Culture, blood (routine x 2)     Status: None (Preliminary result)   Collection Time: 05/08/16  9:53 AM  Result Value Ref Range Status   Specimen Description BLOOD R ARM  Final   Special Requests BOTTLES DRAWN AEROBIC AND ANAEROBIC BCLV  Final   Culture NO GROWTH < 24 HOURS  Final   Report Status PENDING  Incomplete  MRSA PCR Screening     Status: None   Collection Time: 05/08/16  2:31 PM  Result Value Ref Range Status   MRSA by PCR NEGATIVE NEGATIVE Final    Comment:        The GeneXpert MRSA Assay (FDA approved for NASAL specimens only), is one component of a comprehensive MRSA colonization surveillance program. It is not intended to diagnose MRSA infection nor to guide or monitor treatment for MRSA infections.     Coagulation Studies:  Recent Labs  05/08/16 1322  LABPROT 18.6*  INR 1.54    Urinalysis: No results for input(s): COLORURINE, LABSPEC, PHURINE, GLUCOSEU, HGBUR, BILIRUBINUR, KETONESUR, PROTEINUR, UROBILINOGEN, NITRITE, LEUKOCYTESUR in the last 72  hours.  Invalid input(s): APPERANCEUR    Imaging: Ct Head Wo Contrast  Result Date: 05/08/2016 CLINICAL DATA:  Unwitnessed fall.  Head abrasion. EXAM: CT HEAD WITHOUT CONTRAST CT CERVICAL SPINE WITHOUT CONTRAST TECHNIQUE: Multidetector CT imaging of the head and cervical spine was performed following the standard protocol without intravenous contrast. Multiplanar CT image reconstructions of the  cervical spine were also generated. COMPARISON:  None. FINDINGS: CT HEAD FINDINGS Brain: No evidence of acute infarction, hemorrhage, hydrocephalus, extra-axial collection or mass lesion/mass effect. Moderate low-density in the cerebral white matter attributed to chronic microvascular ischemia. Vascular: Atherosclerotic calcification. Skull: Bubbles of gas in the right infratemporal and temporal fossa is intravenous. No fracture noted. Sinuses/Orbits: Generalized mild mucosal thickening. CT CERVICAL SPINE FINDINGS Alignment: No traumatic malalignment. Facet mediated anterolisthesis at C4-5, C5-6, C6-7, and C7-T1. Skull base and vertebrae: Negative for acute fracture. Soft tissues and spinal canal: No prevertebral fluid or swelling. No visible canal hematoma. Disc levels: Diffuse advanced facet arthropathy bulky spurring and multilevel listhesis. Milder disc degeneration. Advanced atlantodental degeneration with anterior arch thinning. Upper chest: No acute finding IMPRESSION: 1. No evidence of intracranial or cervical spine injury. 2. Advanced cervical spine degeneration. 3. Chronic microvascular disease in the cerebral white matter. Electronically Signed   By: [name]   On: 05/08/2016 09:42   Ct Chest Wo Contrast  Result Date: 05/08/2016 CLINICAL DATA:  Sepsis, pneumonia, hypotension, rapid atrial fibrillation, history hypertension, melanoma, diabetes mellitus, CHF, former smoker EXAM: CT CHEST WITHOUT CONTRAST TECHNIQUE: Multidetector CT imaging of the chest was performed following the standard  protocol without IV contrast. Sagittal and coronal MPR images reconstructed from axial data set. COMPARISON:  Chest radiograph 05/08/2016 FINDINGS: Cardiovascular: Atherosclerotic calcifications aorta, coronary arteries, and proximal great vessels. Upper normal caliber thoracic aorta without aneurysmal dilatation. No pericardial effusion. Mediastinum/Nodes: Scattered normal size lymph nodes at base of cervical region and retroclavicular. Probable segmental dilatation of the LEFT subclavian vein versus less likely an adjacent mildly enlarged lymph node. Scattered normal sized mediastinal lymph nodes otherwise seen. Esophagus unremarkable. Lungs/Pleura: Extensive airspace consolidation LEFT upper and LEFT lower lobes consistent with pneumonia. Mild scattered central infiltrates in RIGHT lower lobe. Underlying emphysematous changes and mild central peribronchial thickening. No definite pulmonary mass or nodule no abnormalities in LEFT lung could be obscured by infiltrate. No pleural effusion or pneumothorax. Upper Abdomen: Beam hardening artifacts secondary to the patient's arms traverse the upper abdomen. No definite upper abdominal abnormalities. Musculoskeletal: No acute osseous findings. IMPRESSION: Extensive airspace consolidation within the LEFT upper and LEFT lower lobes consistent with pneumonia. Minimal patchy central RIGHT lower lobe infiltrates as well. Aortic atherosclerosis and coronary arterial calcification. Electronically Signed   By: Lavonia Dana M.D.   On: 05/08/2016 18:41   Ct Cervical Spine Wo Contrast  Result Date: 05/08/2016 CLINICAL DATA:  Unwitnessed fall.  Head abrasion. EXAM: CT HEAD WITHOUT CONTRAST CT CERVICAL SPINE WITHOUT CONTRAST TECHNIQUE: Multidetector CT imaging of the head and cervical spine was performed following the standard protocol without intravenous contrast. Multiplanar CT image reconstructions of the cervical spine were also generated. COMPARISON:  None. FINDINGS: CT HEAD  FINDINGS Brain: No evidence of acute infarction, hemorrhage, hydrocephalus, extra-axial collection or mass lesion/mass effect. Moderate low-density in the cerebral white matter attributed to chronic microvascular ischemia. Vascular: Atherosclerotic calcification. Skull: Bubbles of gas in the right infratemporal and temporal fossa is intravenous. No fracture noted. Sinuses/Orbits: Generalized mild mucosal thickening. CT CERVICAL SPINE FINDINGS Alignment: No traumatic malalignment. Facet mediated anterolisthesis at C4-5, C5-6, C6-7, and C7-T1. Skull base and vertebrae: Negative for acute fracture. Soft tissues and spinal canal: No prevertebral fluid or swelling. No visible canal hematoma. Disc levels: Diffuse advanced facet arthropathy bulky spurring and multilevel listhesis. Milder disc degeneration. Advanced atlantodental degeneration with anterior arch thinning. Upper chest: No acute finding IMPRESSION: 1. No evidence of intracranial or cervical spine  injury. 2. Advanced cervical spine degeneration. 3. Chronic microvascular disease in the cerebral white matter. Electronically Signed   By: [name]   On: 05/08/2016 09:42   Dg Chest Port 1 View  Result Date: 05/08/2016 CLINICAL DATA:  76 year old male with vomiting since 1815 hours yesterday. Syncope this morning. Initial encounter. EXAM: PORTABLE CHEST 1 VIEW COMPARISON:  None. FINDINGS: Portable AP semi upright view at 0915 hours. Confluent abnormal pulmonary opacity throughout the mid and lower left lung, sparing the left costophrenic angle. No definite pleural effusion. Stable cardiomegaly and mediastinal contours. Calcified aortic atherosclerosis. Visualized tracheal air column is within normal limits. The right lung appears clear when allowing for portable technique. No pneumothorax. Negative visible bowel gas pattern. No acute osseous abnormality identified. IMPRESSION: 1. Confluent left lung opacity compatible with aspiration and/or pneumonia  in this clinical setting. No associated pleural effusion. 2. Mild cardiomegaly.  Calcified aortic atherosclerosis. Electronically Signed   By: Genevie Ann M.D.   On: 05/08/2016 09:42      Assessment & Plan: Pt is a 76 y.o. male  with a PMHx of Atrial fibrillation, BPH, congestive heart failure, coronary artery disease, depression, diabetes mellitus type 2, hypertension, hyperlipidemia, history of malignant melanoma who was admitted to [facility] on 05/08/2016 for evaluation of fall.  Pt found to have extensive left sided pneumonia with sepsis, acute respiratory failure, acute renal failure.   1.  Acute renal failure due to ATN from sepsis. (baseline Cr 1.0) 2.  Acute respiratory failure due to left sided pneumonia. 3.  Metabolic/lactic acidosis. 4.  Atrial fibrillation with RVR. 5.  Sepsis  Plan:  Patient seen at bedside in the critical care unit. The primary issue appears to be an extensive left-sided pneumonia that is now led to multiple organ issues. He appears to have acute respiratory failure with acidosis and also has acute renal failure. His acute renal failure secondary to acute tubular necrosis from the sepsis. No urgent indication for dialysis at the moment however I did discuss with the family that this is a possibility if his oliguria persists. For now continue sodium bicarbonate drip 150 mEq at 50 cc per hour. Also continue supportive care with vancomycin as well as cefepime for his underlying extensive pneumonia. Patient also remains on BiPAP. Overall prognosis is guarded at the moment.

## 2016-05-09 NOTE — Progress Notes (Signed)
Pharmacy Antibiotic Note  Todd Valentine is a 76 y.o. male admitted on 05/08/2016 with sepsis secondary to  pneumonia.  Pharmacy has been consulted for cefepime and vancomycin dosing. Patient started on doxycycline 100mg  IV Q12hr to cover atypicals on 3/16.    Plan: Continue cefepime 2g IV Q24hr. If renal function remains < 69mL/min, will transition patient to 1g IV Q24hr on 3/17.    Continue vancomycin 1250 mg IV Q24hr for goal trough of 15-20. Will continue to monitor renal function. Will obtain trough prior todose on 3/17. MRSA is negative - per rounding team due to severity of patient's illness and ICU status will continue vancomycin at this time.   Height: 5\' 9"  (175.3 cm) Weight: 207 lb 7.3 oz (94.1 kg) IBW/kg (Calculated) : 70.7  Temp (24hrs), Avg:97.9 F (36.6 C), Min:97.6 F (36.4 C), Max:98.2 F (36.8 C)   Recent Labs Lab 05/07/16 2006 05/08/16 0952 05/08/16 1101 05/08/16 1425 05/08/16 1800 05/08/16 2253 05/09/16 0247  WBC 8.8 2.8*  --   --   --   --  6.6  CREATININE  --   --  1.80*  --   --   --  2.53*  LATICACIDVEN  --  3.9*  --  4.4* 5.3* 4.1* 6.2*    Estimated Creatinine Clearance: 28.6 mL/min (A) (by C-G formula based on SCr of 2.53 mg/dL (H)).    Allergies  Allergen Reactions  . Ciprofloxacin     Other reaction(s): Other (See Comments) Pt states he could hardly move  . Sitagliptin     Other reaction(s): Other (See Comments) Weakness    Antimicrobials this admission: 3/15 Zosyn x 1 dose  3/15 cefepime >> 3/15 vancomycin >> 3/16 doxycycline >>  Dose adjustments this admission:  Microbiology results: 3/15 BCx: no growth < 24 hours 3/15  MRSA PCR: negative  Thank you for allowing pharmacy to be a part of this patient's care.  Simpson,Michael L,  05/09/2016 4:16 PM

## 2016-05-09 NOTE — Progress Notes (Signed)
Pt is in stable condition at this time.Pt remains alert and oriented. No complaints of pain on my shift. Pt family at bedside.Pt on 45% BiPAP o2 Saturations at 95%. Report given to Del,RN. Full assessment in EPIC.

## 2016-05-09 NOTE — Progress Notes (Signed)
[place] for electrolyte management   Pharmacy consulted for electrolyte management for 76 yo male admitted with PNA/Afib. Patient ordered sodium bicarb drip at 50 mL/hr.    Plan:  No replacement warranted at this time. Pharmacy will continue to monitor and adjust per consult.     Allergies  Allergen Reactions  . Ciprofloxacin     Other reaction(s): Other (See Comments) Pt states he could hardly move  . Sitagliptin     Other reaction(s): Other (See Comments) Weakness    Patient Measurements: Height: 5\' 9"  (175.3 cm) Weight: 207 lb 7.3 oz (94.1 kg) IBW/kg (Calculated) : 70.7   Vital Signs: Temp: 97.6 F (36.4 C) (03/16 1230) Temp Source: Axillary (03/16 1230) BP: 141/83 (03/16 1330) Pulse Rate: 155 (03/16 1330) Intake/Output from previous day: 03/15 0701 - 03/16 0700 In: 1477.1 [I.V.:377.1; IV Piggyback:1100] Out: 200 [Urine:200] Intake/Output from this shift: Total I/O In: 1489 [I.V.:1239; IV Piggyback:250] Out: 250 [Urine:250]  Labs:  Recent Labs  05/07/16 2006 05/08/16 0952 05/08/16 1101 05/08/16 1322 05/08/16 1800 05/09/16 0247  WBC 8.8 2.8*  --   --   --  6.6  HGB 13.9 13.5  --   --   --  13.9  HCT 41.4 40.1  --   --   --  41.8  PLT 111* 96*  --   --   --  105*  APTT  --   --   --  51*  --   --   CREATININE  --   --  1.80*  --   --  2.53*  MG  --   --  1.3*  --  2.4  --   PHOS  --   --   --   --  2.9  --   ALBUMIN  --   --  3.0*  --   --   --   PROT  --   --  5.9*  --   --   --   AST  --   --  43*  --   --   --   ALT  --   --  24  --   --   --   ALKPHOS  --   --  42  --   --   --   BILITOT  --   --  3.2*  --   --   --    Estimated Creatinine Clearance: 28.6 mL/min (A) (by C-G formula based on SCr of 2.53 mg/dL (H)).   Medical History: Past Medical History:  Diagnosis Date  . Acute renal failure ([place])   . BPH (benign prostatic hyperplasia)   . Chronic atrial fibrillation ([place])   . Chronic  diastolic CHF (congestive heart failure) ([name])    a. echo as above  . Chronic insomnia   . Coronary artery disease, non-occlusive 10/06/2013   a. cath 09/2013: pLAD 40, CTO Diag, ramus 40-->80, OM 50 mRCA 40, med rx  . Depression   . Diabetes mellitus with complication ([place])   . History of diverticulitis   . Hyperkalemia   . Hyperlipidemia   . Hypertension   . Malignant melanoma ([place])   . Mitral regurgitation    a. echo 09/2013: EF 55-60%, mildly dilated left atrrium, mild to moderate MR/TR   Pharmacy will continue to monitor and adjust per consult.   Jahari Billy L 05/09/2016,4:11 PM

## 2016-05-09 NOTE — Progress Notes (Addendum)
[facility] [place] NAME: Todd Valentine    MR#:  [id number]  DATE OF BIRTH:  27-Apr-1940  SUBJECTIVE:  CHIEF COMPLAINT:   Chief Complaint  Patient presents with  . Loss of Consciousness   On Bipap Afib rate 140s  Daughters at bedside  REVIEW OF SYSTEMS:    Review of Systems  Constitutional: Positive for malaise/fatigue. Negative for chills and fever.  HENT: Negative for sore throat.   Eyes: Negative for blurred vision, double vision and pain.  Respiratory: Positive for cough and shortness of breath. Negative for hemoptysis and wheezing.   Cardiovascular: Negative for chest pain, palpitations, orthopnea and leg swelling.  Gastrointestinal: Negative for abdominal pain, constipation, diarrhea, heartburn, nausea and vomiting.  Genitourinary: Negative for dysuria and hematuria.  Musculoskeletal: Negative for back pain and joint pain.  Skin: Negative for rash.  Neurological: Positive for weakness. Negative for sensory change, speech change, focal weakness and headaches.  Endo/Heme/Allergies: Does not bruise/bleed easily.  Psychiatric/Behavioral: Negative for depression. The patient is not nervous/anxious.     DRUG ALLERGIES:   Allergies  Allergen Reactions  . Ciprofloxacin     Other reaction(s): Other (See Comments) Pt states he could hardly move  . Sitagliptin     Other reaction(s): Other (See Comments) Weakness    VITALS:  Blood pressure 131/69, pulse (!) 155, temperature 98.2 F (36.8 C), temperature source Axillary, resp. rate (!) 27, height 5\' 9"  (1.753 m), weight 94.1 kg (207 lb 7.3 oz), SpO2 100 %.  PHYSICAL EXAMINATION:   Physical Exam  GENERAL:  76 y.o.-year-old patient lying in the bed. Bipap. Looks critically ill EYES: Pupils equal, round, reactive to light and accommodation. No scleral icterus. Extraocular muscles intact.  HEENT: Head atraumatic, normocephalic. Oropharynx and nasopharynx clear.  NECK:  Supple,  no jugular venous distention. No thyroid enlargement, no tenderness.  LUNGS: Normal breath sounds bilaterally, no wheezing, rales, rhonchi. No use of accessory muscles of respiration.  CARDIOVASCULAR: Irregularly irregular, tachycardia ABDOMEN: Soft, nontender, nondistended. Bowel sounds present. No organomegaly or mass.  EXTREMITIES: No cyanosis, clubbing or edema b/l.    NEUROLOGIC: Cranial nerves II through XII are intact. No focal Motor or sensory deficits b/l.   PSYCHIATRIC: The patient is alert and oriented x 3.  SKIN: No obvious rash, lesion, or ulcer.   LABORATORY PANEL:   CBC  Recent Labs Lab 05/09/16 0247  WBC 6.6  HGB 13.9  HCT 41.8  PLT 105*   ------------------------------------------------------------------------------------------------------------------ Chemistries   Recent Labs Lab 05/08/16 1101 05/08/16 1800 05/09/16 0247  NA 138  --  140  K 2.8* 4.0 4.7  CL 106  --  104  CO2 23  --  19*  GLUCOSE 119*  --  128*  BUN 24*  --  37*  CREATININE 1.80*  --  2.53*  CALCIUM 7.9*  --  7.9*  MG 1.3* 2.4  --   AST 43*  --   --   ALT 24  --   --   ALKPHOS 42  --   --   BILITOT 3.2*  --   --    ------------------------------------------------------------------------------------------------------------------  Cardiac Enzymes  Recent Labs Lab 05/08/16 2253  TROPONINI 0.69*   ------------------------------------------------------------------------------------------------------------------  RADIOLOGY:  Ct Head Wo Contrast  Result Date: 05/08/2016 CLINICAL DATA:  Unwitnessed fall.  Head abrasion. EXAM: CT HEAD WITHOUT CONTRAST CT CERVICAL SPINE WITHOUT CONTRAST TECHNIQUE: Multidetector CT imaging of the head and cervical spine was performed following the standard protocol  without intravenous contrast. Multiplanar CT image reconstructions of the cervical spine were also generated. COMPARISON:  None. FINDINGS: CT HEAD FINDINGS Brain: No evidence of acute  infarction, hemorrhage, hydrocephalus, extra-axial collection or mass lesion/mass effect. Moderate low-density in the cerebral white matter attributed to chronic microvascular ischemia. Vascular: Atherosclerotic calcification. Skull: Bubbles of gas in the right infratemporal and temporal fossa is intravenous. No fracture noted. Sinuses/Orbits: Generalized mild mucosal thickening. CT CERVICAL SPINE FINDINGS Alignment: No traumatic malalignment. Facet mediated anterolisthesis at C4-5, C5-6, C6-7, and C7-T1. Skull base and vertebrae: Negative for acute fracture. Soft tissues and spinal canal: No prevertebral fluid or swelling. No visible canal hematoma. Disc levels: Diffuse advanced facet arthropathy bulky spurring and multilevel listhesis. Milder disc degeneration. Advanced atlantodental degeneration with anterior arch thinning. Upper chest: No acute finding IMPRESSION: 1. No evidence of intracranial or cervical spine injury. 2. Advanced cervical spine degeneration. 3. Chronic microvascular disease in the cerebral white matter. Electronically Signed   By: [name]   On: 05/08/2016 09:42   Ct Chest Wo Contrast  Result Date: 05/08/2016 CLINICAL DATA:  Sepsis, pneumonia, hypotension, rapid atrial fibrillation, history hypertension, melanoma, diabetes mellitus, CHF, former smoker EXAM: CT CHEST WITHOUT CONTRAST TECHNIQUE: Multidetector CT imaging of the chest was performed following the standard protocol without IV contrast. Sagittal and coronal MPR images reconstructed from axial data set. COMPARISON:  Chest radiograph 05/08/2016 FINDINGS: Cardiovascular: Atherosclerotic calcifications aorta, coronary arteries, and proximal great vessels. Upper normal caliber thoracic aorta without aneurysmal dilatation. No pericardial effusion. Mediastinum/Nodes: Scattered normal size lymph nodes at base of cervical region and retroclavicular. Probable segmental dilatation of the LEFT subclavian vein versus less likely  an adjacent mildly enlarged lymph node. Scattered normal sized mediastinal lymph nodes otherwise seen. Esophagus unremarkable. Lungs/Pleura: Extensive airspace consolidation LEFT upper and LEFT lower lobes consistent with pneumonia. Mild scattered central infiltrates in RIGHT lower lobe. Underlying emphysematous changes and mild central peribronchial thickening. No definite pulmonary mass or nodule no abnormalities in LEFT lung could be obscured by infiltrate. No pleural effusion or pneumothorax. Upper Abdomen: Beam hardening artifacts secondary to the patient's arms traverse the upper abdomen. No definite upper abdominal abnormalities. Musculoskeletal: No acute osseous findings. IMPRESSION: Extensive airspace consolidation within the LEFT upper and LEFT lower lobes consistent with pneumonia. Minimal patchy central RIGHT lower lobe infiltrates as well. Aortic atherosclerosis and coronary arterial calcification. Electronically Signed   By: Lavonia Dana M.D.   On: 05/08/2016 18:41   Ct Cervical Spine Wo Contrast  Result Date: 05/08/2016 CLINICAL DATA:  Unwitnessed fall.  Head abrasion. EXAM: CT HEAD WITHOUT CONTRAST CT CERVICAL SPINE WITHOUT CONTRAST TECHNIQUE: Multidetector CT imaging of the head and cervical spine was performed following the standard protocol without intravenous contrast. Multiplanar CT image reconstructions of the cervical spine were also generated. COMPARISON:  None. FINDINGS: CT HEAD FINDINGS Brain: No evidence of acute infarction, hemorrhage, hydrocephalus, extra-axial collection or mass lesion/mass effect. Moderate low-density in the cerebral white matter attributed to chronic microvascular ischemia. Vascular: Atherosclerotic calcification. Skull: Bubbles of gas in the right infratemporal and temporal fossa is intravenous. No fracture noted. Sinuses/Orbits: Generalized mild mucosal thickening. CT CERVICAL SPINE FINDINGS Alignment: No traumatic malalignment. Facet mediated anterolisthesis at  C4-5, C5-6, C6-7, and C7-T1. Skull base and vertebrae: Negative for acute fracture. Soft tissues and spinal canal: No prevertebral fluid or swelling. No visible canal hematoma. Disc levels: Diffuse advanced facet arthropathy bulky spurring and multilevel listhesis. Milder disc degeneration. Advanced atlantodental degeneration with anterior arch thinning. Upper chest: No acute  finding IMPRESSION: 1. No evidence of intracranial or cervical spine injury. 2. Advanced cervical spine degeneration. 3. Chronic microvascular disease in the cerebral white matter. Electronically Signed   By: [name]   On: 05/08/2016 09:42   Dg Chest Port 1 View  Result Date: 05/09/2016 CLINICAL DATA:  Pneumonia, respiratory failure, atrial fibrillation and congestive heart failure. EXAM: PORTABLE CHEST 1 VIEW COMPARISON:  Chest x-ray and CT studies on 05/08/2016 FINDINGS: Airspace consolidation within the left upper lobe and lower lobe is partially masked by an overlying temporary pacing pad. Overall distribution likely similar to the chest x-ray yesterday. No overt edema or pleural effusion identified. Stable cardiac enlargement. No pneumothorax. IMPRESSION: Stable overall distribution of left lung airspace consolidation. Appearance by chest x-ray is today partially masked by an overlying temporary pacing pad. Electronically Signed   By: Aletta Edouard M.D.   On: 05/09/2016 09:51   Dg Chest Port 1 View  Result Date: 05/08/2016 CLINICAL DATA:  76 year old male with vomiting since 1815 hours yesterday. Syncope this morning. Initial encounter. EXAM: PORTABLE CHEST 1 VIEW COMPARISON:  None. FINDINGS: Portable AP semi upright view at 0915 hours. Confluent abnormal pulmonary opacity throughout the mid and lower left lung, sparing the left costophrenic angle. No definite pleural effusion. Stable cardiomegaly and mediastinal contours. Calcified aortic atherosclerosis. Visualized tracheal air column is within normal limits. The  right lung appears clear when allowing for portable technique. No pneumothorax. Negative visible bowel gas pattern. No acute osseous abnormality identified. IMPRESSION: 1. Confluent left lung opacity compatible with aspiration and/or pneumonia in this clinical setting. No associated pleural effusion. 2. Mild cardiomegaly.  Calcified aortic atherosclerosis. Electronically Signed   By: Genevie Ann M.D.   On: 05/08/2016 09:42     ASSESSMENT AND PLAN:   76 year old male with past history of chronic atrial fibrillation, hypertension, hyperlipidemia, diabetes, history of coronary artery disease, history of congestive heart failure, BPH who presents to the hospital due to a fall and weakness and noted to be hypotensive.  * Bilateral pneumonia with severe sepsis and acute hypoxic resp failure On IV abx, nebs. Cx pending. Bipap Critically ill Repeat cxr  * Atrial fibrillation with rapid ventricular response due to sepsis On amiodarone infusion Digoxin as discussed with Dr. Rockey Situ  * AKI due to ATN/sepsis Appreciate nephrology input On bicarb drip  * Metabolic acidodid due to lactic acid Improving  * Elevated Trop Due to demand ischemia and Afib. Not MI  * Hypokalemia  Replaced  * Diabetes type 2 without complication-hold metformin, Amaryl right for now.  on sliding scale insulin.  * Hyperlipidemia-continue simvastatin.  * Essential hypertension Meds on hold due to hypotension  Discussed with ICU team  All the records are reviewed and case discussed with Care Management/Social Workerr. Management plans discussed with the patient, family and they are in agreement.  CODE STATUS: Partial code  Code status discussed with patient  DVT Prophylaxis: SCDs  TOTAL CC TIME TAKING CARE OF THIS PATIENT: 35 minutes.   POSSIBLE D/C IN 3-4 DAYS, DEPENDING ON CLINICAL CONDITION.  Hillary Bow R M.D on 05/09/2016 at 11:33 AM  Between 7am to 6pm - Pager - [phone]  After 6pm go  to [email or web address] - password [email or web address] [facility]  Office  [phone]  CC: Primary care physician; [name], MD  Note: This dictation was prepared with Dragon dictation along with smaller phrase technology. Any transcriptional errors that result from this process are unintentional.

## 2016-05-09 NOTE — Progress Notes (Signed)
Progress Note  Patient Name: Todd Valentine Date of Encounter: 05/09/2016  Primary Cardiologist: Rockey Situ  Subjective   On BiPAP this morning me started overnight Per nursing labetalol IV bolus was given for tachycardia around 11:30 with acute decompensation, went apneic, required brief respiratory support This morning when taking well on BiPAP heart rate 140 bpm, blood pressure stabilized Worsening renal function  Inpatient Medications    Scheduled Meds: . budesonide (PULMICORT) nebulizer solution  0.5 mg Nebulization BID  . carvedilol  6.25 mg Oral BID WC  . ceFEPIme (MAXIPIME) 2 GM IVP  2 g Intravenous q1800  . chlorhexidine  15 mL Mouth Rinse BID  . doxycycline (VIBRAMYCIN) IV  100 mg Intravenous Q12H  . guaiFENesin  600 mg Oral BID  . insulin aspart  0-5 Units Subcutaneous QHS  . insulin aspart  0-9 Units Subcutaneous TID WC  . ipratropium-albuterol  3 mL Nebulization Q4H  . mouth rinse  15 mL Mouth Rinse q12n4p  . methylPREDNISolone (SOLU-MEDROL) injection  40 mg Intravenous Q12H  . sodium chloride flush  3 mL Intravenous Q12H  . tamsulosin  0.4 mg Oral Daily  . vancomycin  1,250 mg Intravenous Q24H   Continuous Infusions: . amiodarone 60 mg/hr (05/09/16 1140)  . diltiazem (CARDIZEM) infusion 1 mg/hr (05/09/16 1100)  . heparin 1,300 Units/hr (05/09/16 1100)  .  sodium bicarbonate  infusion 1000 mL 50 mL/hr at 05/09/16 1100   PRN Meds: acetaminophen **OR** acetaminophen, ondansetron **OR** ondansetron (ZOFRAN) IV, traZODone   Vital Signs    Vitals:   05/09/16 1000 05/09/16 1030 05/09/16 1100 05/09/16 1130  BP: 131/69 (!) 141/88 119/76 (!) 111/92  Pulse: (!) 155 (!) 137 (!) 135 (!) 139  Resp: (!) 27 (!) 27 (!) 30 (!) 33  Temp:      TempSrc:      SpO2: 100% 100% 100% 100%  Weight:      Height:        Intake/Output Summary (Last 24 hours) at 05/09/16 1217 Last data filed at 05/09/16 1135  Gross per 24 hour  Intake          2271.47 ml  Output               450 ml  Net          1821.47 ml   Filed Weights   05/08/16 0906 05/08/16 1405  Weight: 210 lb (95.3 kg) 207 lb 7.3 oz (94.1 kg)    Telemetry    Atrial ablation with RVR rate 140 bpm - Personally Reviewed  ECG      Physical Exam    GEN: Appears critically ill, Respiratory distress on BiPAP  Neck: No JVD  Cardiac:Irregularly irregular, rapid, no murmurs, rubs, or gallops.  Radials/DP/PT 2+ and equal bilaterally.  Respiratory:  Coarse breath sounds bilaterally , increased work of breathing  GI: Soft, nontender, non-distended  MS: no deformity; no edema Neuro:  Alert, lethargic  Labs    Chemistry Recent Labs Lab 05/08/16 1101 05/08/16 1800 05/09/16 0247  NA 138  --  140  K 2.8* 4.0 4.7  CL 106  --  104  CO2 23  --  19*  GLUCOSE 119*  --  128*  BUN 24*  --  37*  CREATININE 1.80*  --  2.53*  CALCIUM 7.9*  --  7.9*  PROT 5.9*  --   --   ALBUMIN 3.0*  --   --   AST 43*  --   --  ALT 24  --   --   ALKPHOS 42  --   --   BILITOT 3.2*  --   --   GFRNONAA 35*  --  23*  GFRAA 41*  --  27*  ANIONGAP 9  --  17*     Hematology Recent Labs Lab 05/07/16 2006 05/08/16 0952 05/09/16 0247  WBC 8.8 2.8* 6.6  RBC 4.70 4.49 4.65  HGB 13.9 13.5 13.9  HCT 41.4 40.1 41.8  MCV 88.1 89.5 90.0  MCH 29.6 30.1 30.0  MCHC 33.6 33.6 33.3  RDW 13.4 13.8 14.0  PLT 111* 96* 105*    Cardiac Enzymes Recent Labs Lab 05/08/16 1101 05/08/16 1425 05/08/16 1800 05/08/16 2253  TROPONINI 0.42* 0.48* 0.59* 0.69*   No results for input(s): TROPIPOC in the last 168 hours.   BNPNo results for input(s): BNP, PROBNP in the last 168 hours.   DDimer No results for input(s): DDIMER in the last 168 hours.   Radiology    Ct Head Wo Contrast  Result Date: 05/08/2016 CLINICAL DATA:  Unwitnessed fall.  Head abrasion. EXAM: CT HEAD WITHOUT CONTRAST CT CERVICAL SPINE WITHOUT CONTRAST TECHNIQUE: Multidetector CT imaging of the head and cervical spine was performed following the standard  protocol without intravenous contrast. Multiplanar CT image reconstructions of the cervical spine were also generated. COMPARISON:  None. FINDINGS: CT HEAD FINDINGS Brain: No evidence of acute infarction, hemorrhage, hydrocephalus, extra-axial collection or mass lesion/mass effect. Moderate low-density in the cerebral white matter attributed to chronic microvascular ischemia. Vascular: Atherosclerotic calcification. Skull: Bubbles of gas in the right infratemporal and temporal fossa is intravenous. No fracture noted. Sinuses/Orbits: Generalized mild mucosal thickening. CT CERVICAL SPINE FINDINGS Alignment: No traumatic malalignment. Facet mediated anterolisthesis at C4-5, C5-6, C6-7, and C7-T1. Skull base and vertebrae: Negative for acute fracture. Soft tissues and spinal canal: No prevertebral fluid or swelling. No visible canal hematoma. Disc levels: Diffuse advanced facet arthropathy bulky spurring and multilevel listhesis. Milder disc degeneration. Advanced atlantodental degeneration with anterior arch thinning. Upper chest: No acute finding IMPRESSION: 1. No evidence of intracranial or cervical spine injury. 2. Advanced cervical spine degeneration. 3. Chronic microvascular disease in the cerebral white matter. Electronically Signed   By: [name]   On: 05/08/2016 09:42   Ct Chest Wo Contrast  Result Date: 05/08/2016 CLINICAL DATA:  Sepsis, pneumonia, hypotension, rapid atrial fibrillation, history hypertension, melanoma, diabetes mellitus, CHF, former smoker EXAM: CT CHEST WITHOUT CONTRAST TECHNIQUE: Multidetector CT imaging of the chest was performed following the standard protocol without IV contrast. Sagittal and coronal MPR images reconstructed from axial data set. COMPARISON:  Chest radiograph 05/08/2016 FINDINGS: Cardiovascular: Atherosclerotic calcifications aorta, coronary arteries, and proximal great vessels. Upper normal caliber thoracic aorta without aneurysmal dilatation. No  pericardial effusion. Mediastinum/Nodes: Scattered normal size lymph nodes at base of cervical region and retroclavicular. Probable segmental dilatation of the LEFT subclavian vein versus less likely an adjacent mildly enlarged lymph node. Scattered normal sized mediastinal lymph nodes otherwise seen. Esophagus unremarkable. Lungs/Pleura: Extensive airspace consolidation LEFT upper and LEFT lower lobes consistent with pneumonia. Mild scattered central infiltrates in RIGHT lower lobe. Underlying emphysematous changes and mild central peribronchial thickening. No definite pulmonary mass or nodule no abnormalities in LEFT lung could be obscured by infiltrate. No pleural effusion or pneumothorax. Upper Abdomen: Beam hardening artifacts secondary to the patient's arms traverse the upper abdomen. No definite upper abdominal abnormalities. Musculoskeletal: No acute osseous findings. IMPRESSION: Extensive airspace consolidation within the LEFT upper and LEFT lower  lobes consistent with pneumonia. Minimal patchy central RIGHT lower lobe infiltrates as well. Aortic atherosclerosis and coronary arterial calcification. Electronically Signed   By: Lavonia Dana M.D.   On: 05/08/2016 18:41   Ct Cervical Spine Wo Contrast  Result Date: 05/08/2016 CLINICAL DATA:  Unwitnessed fall.  Head abrasion. EXAM: CT HEAD WITHOUT CONTRAST CT CERVICAL SPINE WITHOUT CONTRAST TECHNIQUE: Multidetector CT imaging of the head and cervical spine was performed following the standard protocol without intravenous contrast. Multiplanar CT image reconstructions of the cervical spine were also generated. COMPARISON:  None. FINDINGS: CT HEAD FINDINGS Brain: No evidence of acute infarction, hemorrhage, hydrocephalus, extra-axial collection or mass lesion/mass effect. Moderate low-density in the cerebral white matter attributed to chronic microvascular ischemia. Vascular: Atherosclerotic calcification. Skull: Bubbles of gas in the right infratemporal and  temporal fossa is intravenous. No fracture noted. Sinuses/Orbits: Generalized mild mucosal thickening. CT CERVICAL SPINE FINDINGS Alignment: No traumatic malalignment. Facet mediated anterolisthesis at C4-5, C5-6, C6-7, and C7-T1. Skull base and vertebrae: Negative for acute fracture. Soft tissues and spinal canal: No prevertebral fluid or swelling. No visible canal hematoma. Disc levels: Diffuse advanced facet arthropathy bulky spurring and multilevel listhesis. Milder disc degeneration. Advanced atlantodental degeneration with anterior arch thinning. Upper chest: No acute finding IMPRESSION: 1. No evidence of intracranial or cervical spine injury. 2. Advanced cervical spine degeneration. 3. Chronic microvascular disease in the cerebral white matter. Electronically Signed   By: [name]   On: 05/08/2016 09:42   Dg Chest Port 1 View  Result Date: 05/09/2016 CLINICAL DATA:  Pneumonia, respiratory failure, atrial fibrillation and congestive heart failure. EXAM: PORTABLE CHEST 1 VIEW COMPARISON:  Chest x-ray and CT studies on 05/08/2016 FINDINGS: Airspace consolidation within the left upper lobe and lower lobe is partially masked by an overlying temporary pacing pad. Overall distribution likely similar to the chest x-ray yesterday. No overt edema or pleural effusion identified. Stable cardiac enlargement. No pneumothorax. IMPRESSION: Stable overall distribution of left lung airspace consolidation. Appearance by chest x-ray is today partially masked by an overlying temporary pacing pad. Electronically Signed   By: Aletta Edouard M.D.   On: 05/09/2016 09:51   Dg Chest Port 1 View  Result Date: 05/08/2016 CLINICAL DATA:  76 year old male with vomiting since 1815 hours yesterday. Syncope this morning. Initial encounter. EXAM: PORTABLE CHEST 1 VIEW COMPARISON:  None. FINDINGS: Portable AP semi upright view at 0915 hours. Confluent abnormal pulmonary opacity throughout the mid and lower left lung,  sparing the left costophrenic angle. No definite pleural effusion. Stable cardiomegaly and mediastinal contours. Calcified aortic atherosclerosis. Visualized tracheal air column is within normal limits. The right lung appears clear when allowing for portable technique. No pneumothorax. Negative visible bowel gas pattern. No acute osseous abnormality identified. IMPRESSION: 1. Confluent left lung opacity compatible with aspiration and/or pneumonia in this clinical setting. No associated pleural effusion. 2. Mild cardiomegaly.  Calcified aortic atherosclerosis. Electronically Signed   By: Genevie Ann M.D.   On: 05/08/2016 09:42    Cardiac Studies   Echocardiogram reviewed personally by myself showing low normal ejection fraction, ejection fraction 50-55%, small LV cavity  Patient Profile     76 year old gentleman with chronic atrial fibrillation, last seen April 25 2016 In clinic at which time he was doing well, prior history chronic diastolic CHF, type 2 diabetes, three-vessel coronary disease by catheterization August 2015, presenting with chills, fever, malaise, shortness of breath consistent with pneumonia, atrial fibrillation with RVR heart rate up to 170 bpm  Assessment &  Plan    ----Acute respiratory failure Pneumonia with sepsis on broad-spectrum antibiotics, nebulizers BiPAP support   ----Atrial fibrillation with RVR Elevated rate likely secondary to underlying pneumonia, sepsis On amiodarone infusion for rate control Still running fast 140s up to 150s continue amiodarone infusion Would limit IV fluids He is on heparin infusion Recommended we start very low-grade diltiazem given blood pressure 140/90 allowing room for rate control medication Echocardiogram showing low normal ejection fraction Pradaxa held on March 13 by the patient when he did not feel well  -----Acute renal failure  ATN Followed by nephrology No improvement with IV fluids  Elevated troponin  demand ischemia in  the setting of sepsis, tachycardia, hypoxia   Long discussion with hospitalist service, nursing  Total encounter time more than 35 minutes  Greater than 50% was spent in counseling and coordination of care with the patient   Signed, Ida Rogue, MD  05/09/2016, 12:17 PM

## 2016-05-09 NOTE — Progress Notes (Signed)
ANTICOAGULATION CONSULT NOTE - Initial Consult  Pharmacy Consult for heparin drip  Indication: atrial fibrillation  Allergies  Allergen Reactions  . Ciprofloxacin     Other reaction(s): Other (See Comments) Pt states he could hardly move  . Sitagliptin     Other reaction(s): Other (See Comments) Weakness    Patient Measurements: Height: 5\' 9"  (175.3 cm) Weight: 207 lb 7.3 oz (94.1 kg) IBW/kg (Calculated) : 70.7 Heparin Dosing Weight: 90kg  Vital Signs: Temp: 97.6 F (36.4 C) (03/16 1230) Temp Source: Axillary (03/16 1230) BP: 141/83 (03/16 1330) Pulse Rate: 155 (03/16 1330)  Labs:  Recent Labs  05/07/16 2006 05/08/16 0952  05/08/16 1101 05/08/16 1322 05/08/16 1425 05/08/16 1800 05/08/16 2253 05/09/16 0247 05/09/16 0737  HGB 13.9 13.5  --   --   --   --   --   --  13.9  --   HCT 41.4 40.1  --   --   --   --   --   --  41.8  --   PLT 111* 96*  --   --   --   --   --   --  105*  --   APTT  --   --   --   --  51*  --   --   --   --   --   LABPROT  --   --   --   --  18.6*  --   --   --   --   --   INR  --   --   --   --  1.54  --   --   --   --   --   HEPARINUNFRC  --   --   --   --  <0.10*  --   --  0.51  --  0.48  CREATININE  --   --   --  1.80*  --   --   --   --  2.53*  --   TROPONINI  --   --   < > 0.42*  --  0.48* 0.59* 0.69*  --   --   < > = values in this interval not displayed.  Estimated Creatinine Clearance: 28.6 mL/min (A) (by C-G formula based on SCr of 2.53 mg/dL (H)).   Medical History: Past Medical History:  Diagnosis Date  . Acute renal failure ([place])   . BPH (benign prostatic hyperplasia)   . Chronic atrial fibrillation ([place])   . Chronic diastolic CHF (congestive heart failure) ([name])    a. echo as above  . Chronic insomnia   . Coronary artery disease, non-occlusive 10/06/2013   a. cath 09/2013: pLAD 40, CTO Diag, ramus 40-->80, OM 50 mRCA 40, med rx  . Depression   . Diabetes mellitus with complication ([place])   . History of diverticulitis    . Hyperkalemia   . Hyperlipidemia   . Hypertension   . Malignant melanoma ([place])   . Mitral regurgitation    a. echo 09/2013: EF 55-60%, mildly dilated left atrrium, mild to moderate MR/TR    Assessment: 76 yo male with PMH of A. Fib. Patient has been taking Dabigatran BID at home until he runs out of current prescription- then plan was for him to start apixaban 5mg  BID. Patient dose not know when he took his last dose of Dabigatran, but guesses it was probably on 3/13 at some point. Patient ordered heparin 1300 units/hr.  Goal of Therapy:  Heparin level 0.3-0.7 units/ml aPTT 66-102 seconds Monitor platelets by anticoagulation protocol: Yes   Plan:  Confirmatory level in range. Continue heparin 1300 units/hr.Will recheck anti-Xa level with am labs.   Pharmacy will continue to monitor and adjust per consult.   Talyia Allende L, 05/09/2016 4:06 PM

## 2016-05-10 ENCOUNTER — Inpatient Hospital Stay: Payer: Medicare Other

## 2016-05-10 DIAGNOSIS — I248 Other forms of acute ischemic heart disease: Secondary | ICD-10-CM

## 2016-05-10 DIAGNOSIS — I482 Chronic atrial fibrillation: Secondary | ICD-10-CM

## 2016-05-10 LAB — EXPECTORATED SPUTUM ASSESSMENT W GRAM STAIN, RFLX TO RESP C

## 2016-05-10 LAB — BASIC METABOLIC PANEL
Anion gap: 12 (ref 5–15)
BUN: 61 mg/dL — ABNORMAL HIGH (ref 6–20)
CO2: 27 mmol/L (ref 22–32)
Calcium: 7.7 mg/dL — ABNORMAL LOW (ref 8.9–10.3)
Chloride: 98 mmol/L — ABNORMAL LOW (ref 101–111)
Creatinine, Ser: 2.59 mg/dL — ABNORMAL HIGH (ref 0.61–1.24)
GFR calc Af Amer: 26 mL/min — ABNORMAL LOW (ref >60)
GFR calc non Af Amer: 23 mL/min — ABNORMAL LOW (ref >60)
Glucose, Bld: 187 mg/dL — ABNORMAL HIGH (ref 65–99)
Potassium: 4 mmol/L (ref 3.5–5.1)
Sodium: 137 mmol/L (ref 135–145)

## 2016-05-10 LAB — BLOOD GAS, ARTERIAL
Acid-base deficit: 1.6 mmol/L (ref 0.0–2.0)
Bicarbonate: 27 mmol/L (ref 20.0–28.0)
FIO2: 0.5
O2 Saturation: 88.3 %
Patient temperature: 37
pCO2 arterial: 63 mmHg — ABNORMAL HIGH (ref 32.0–48.0)
pH, Arterial: 7.24 — ABNORMAL LOW (ref 7.350–7.450)
pO2, Arterial: 65 mmHg — ABNORMAL LOW (ref 83.0–108.0)

## 2016-05-10 LAB — TROPONIN I
Troponin I: 0.45 ng/mL (ref <0.03)
Troponin I: 0.43 ng/mL (ref <0.03)

## 2016-05-10 LAB — PROCALCITONIN: Procalcitonin: 128.4 ng/mL

## 2016-05-10 LAB — CBC
HCT: 34.3 % — ABNORMAL LOW (ref 40.0–52.0)
Hemoglobin: 11.9 g/dL — ABNORMAL LOW (ref 13.0–18.0)
MCH: 31.1 pg (ref 26.0–34.0)
MCHC: 34.7 g/dL (ref 32.0–36.0)
MCV: 89.6 fL (ref 80.0–100.0)
Platelets: 99 10*3/uL — ABNORMAL LOW (ref 150–440)
RBC: 3.83 MIL/uL — ABNORMAL LOW (ref 4.40–5.90)
RDW: 13.9 % (ref 11.5–14.5)
WBC: 10.2 10*3/uL (ref 3.8–10.6)

## 2016-05-10 LAB — LACTIC ACID, PLASMA: Lactic Acid, Venous: 1.8 mmol/L (ref 0.5–1.9)

## 2016-05-10 LAB — GLUCOSE, CAPILLARY
Glucose-Capillary: 180 mg/dL — ABNORMAL HIGH (ref 65–99)
Glucose-Capillary: 223 mg/dL — ABNORMAL HIGH (ref 65–99)
Glucose-Capillary: 172 mg/dL — ABNORMAL HIGH (ref 65–99)
Glucose-Capillary: 167 mg/dL — ABNORMAL HIGH (ref 65–99)

## 2016-05-10 LAB — VANCOMYCIN, TROUGH: Vancomycin Tr: 22 ug/mL (ref 15–20)

## 2016-05-10 LAB — HEPARIN LEVEL (UNFRACTIONATED)
Heparin Unfractionated: 0.21 IU/mL — ABNORMAL LOW (ref 0.30–0.70)
Heparin Unfractionated: 0.26 IU/mL — ABNORMAL LOW (ref 0.30–0.70)
Heparin Unfractionated: 0.48 IU/mL (ref 0.30–0.70)

## 2016-05-10 MED ORDER — PROMETHAZINE HCL 25 MG/ML IJ SOLN
12.5000 mg | Freq: Four times a day (QID) | INTRAMUSCULAR | Status: DC | PRN
  Administered 2016-05-10: 12.5 mg via INTRAVENOUS
  Filled 2016-05-10: qty 1

## 2016-05-10 MED ORDER — FENTANYL BOLUS VIA INFUSION: 50.0000 ug | INTRAVENOUS | Status: DC | PRN

## 2016-05-10 MED ORDER — VANCOMYCIN HCL IN DEXTROSE 1-5 GM/200ML-% IV SOLN
1000.0000 mg | INTRAVENOUS | Status: DC
  Administered 2016-05-10: 1000 mg via INTRAVENOUS
  Filled 2016-05-10: qty 200

## 2016-05-10 MED ORDER — FUROSEMIDE 10 MG/ML IJ SOLN: 20.0000 mg | Freq: Once | INTRAMUSCULAR | Status: DC

## 2016-05-10 MED ORDER — LABETALOL HCL 5 MG/ML IV SOLN
10.0000 mg | Freq: Four times a day (QID) | INTRAVENOUS | Status: DC | PRN
  Administered 2016-05-16 – 2016-05-17 (×3): 10 mg via INTRAVENOUS
  Filled 2016-05-10 (×4): qty 4

## 2016-05-10 MED ORDER — FUROSEMIDE 10 MG/ML IJ SOLN
40.0000 mg | Freq: Once | INTRAMUSCULAR | Status: AC
  Administered 2016-05-10: 40 mg via INTRAVENOUS
  Filled 2016-05-10: qty 4

## 2016-05-10 MED ORDER — HEPARIN BOLUS VIA INFUSION
1400.0000 [IU] | Freq: Once | INTRAVENOUS | Status: AC
  Administered 2016-05-10: 1400 [IU] via INTRAVENOUS
  Filled 2016-05-10: qty 1400

## 2016-05-10 MED ORDER — MORPHINE SULFATE (PF) 4 MG/ML IV SOLN
2.0000 mg | INTRAVENOUS | Status: DC | PRN
  Administered 2016-05-10: 4 mg via INTRAVENOUS
  Filled 2016-05-10: qty 1

## 2016-05-10 MED ORDER — IPRATROPIUM-ALBUTEROL 0.5-2.5 (3) MG/3ML IN SOLN
3.0000 mL | Freq: Four times a day (QID) | RESPIRATORY_TRACT | Status: DC
  Administered 2016-05-10 – 2016-05-17 (×28): 3 mL via RESPIRATORY_TRACT
  Filled 2016-05-10 (×29): qty 3

## 2016-05-10 MED ORDER — FENTANYL CITRATE (PF) 100 MCG/2ML IJ SOLN: 100.0000 ug | Freq: Once | INTRAMUSCULAR | Status: DC

## 2016-05-10 MED ORDER — CLONIDINE HCL 0.1 MG PO TABS
0.1000 mg | ORAL_TABLET | Freq: Once | ORAL | Status: AC
  Administered 2016-05-10: 0.1 mg via ORAL
  Filled 2016-05-10: qty 1

## 2016-05-10 MED ORDER — HEPARIN BOLUS VIA INFUSION
1300.0000 [IU] | Freq: Once | INTRAVENOUS | Status: AC
  Administered 2016-05-10: 1300 [IU] via INTRAVENOUS
  Filled 2016-05-10: qty 1300

## 2016-05-10 MED ORDER — PIPERACILLIN-TAZOBACTAM 3.375 G IVPB
3.3750 g | Freq: Three times a day (TID) | INTRAVENOUS | Status: DC
  Administered 2016-05-11 – 2016-05-12 (×5): 3.375 g via INTRAVENOUS
  Filled 2016-05-10 (×5): qty 50

## 2016-05-10 MED ORDER — FENTANYL 2500MCG IN NS 250ML (10MCG/ML) PREMIX INFUSION: 25.0000 ug/h | INTRAVENOUS | Status: DC

## 2016-05-10 MED ORDER — DEXTROSE 5 % IV SOLN
1.0000 g | INTRAVENOUS | Status: DC
  Administered 2016-05-10: 1 g via INTRAVENOUS
  Filled 2016-05-10: qty 1

## 2016-05-10 NOTE — Progress Notes (Addendum)
Pharmacy Antibiotic Note  Todd Valentine is a 76 y.o. male admitted on 05/08/2016 with pneumonia.  Pharmacy has been consulted for zosyn dosing.  Plan: Zosyn 3.375g IV q8h (4 hour infusion).  Patient suspected to have aspirated  Height: 5\' 9"  (175.3 cm) Weight: 207 lb 7.3 oz (94.1 kg) IBW/kg (Calculated) : 70.7  Temp (24hrs), Avg:98 F (36.7 C), Min:97.6 F (36.4 C), Max:98.4 F (36.9 C)   Recent Labs Lab 05/07/16 2006 05/08/16 0952 05/08/16 1101  05/08/16 1800 05/08/16 2253 05/09/16 0247 05/09/16 2158 05/10/16 0037 05/10/16 0352 05/10/16 1951  WBC 8.8 2.8*  --   --   --   --  6.6  --   --  10.2  --   CREATININE  --   --  1.80*  --   --   --  2.53*  --   --  2.59*  --   LATICACIDVEN  --  3.9*  --   < > 5.3* 4.1* 6.2* 2.1* 1.8  --   --   VANCOTROUGH  --   --   --   --   --   --   --   --   --   --  22*  < > = values in this interval not displayed.  Estimated Creatinine Clearance: 27.9 mL/min (A) (by C-G formula based on SCr of 2.59 mg/dL (H)).    Allergies  Allergen Reactions  . Ciprofloxacin     Other reaction(s): Other (See Comments) Pt states he could hardly move  . Sitagliptin     Other reaction(s): Other (See Comments) Weakness    Antimicrobials this admission: 3/17 DXY >>  3/17 CFP >> 3/17 3/17 Vanc >> 3/17  Microbiology results: 3/15 BCx: NG 3/16 UCx: Sent  3/17 Sputum: NG  3/15 MRSA PCR: NG  Thank you for allowing pharmacy to be a part of this patient's care.  Tobie Lords, PharmD, BCPS Clinical Pharmacist 05/10/2016

## 2016-05-10 NOTE — Progress Notes (Signed)
Name: Todd Valentine MRN: [id number] DOB: [birth year]    ADMISSION DATE:  05/08/2016 CONSULTATION DATE: 05/08/2016  REFERRING MD :  Dr. Verdell Carmine  CHIEF COMPLAINT: Loss of Consciousness   BRIEF PATIENT DESCRIPTION:  76 yo male with septic shock secondary to pneumonia, acute respiratory failure secondary to pneumonia and AECOPD, afibb with RVR, leukopenia/thrombocytopenia, hypokalemia, hypomagnesia, and gastroeteritis    SIGNIFICANT EVENTS  03/15-Pt admitted to [facility] stepdown unit due to afibb with RVR, acute respiratory failure, gastroenteritis, and septic shock PCCM consulted for additional management   STUDIES:  CT Cervical Spine and Head 03/15>>No evidence of intracranial or cervical spine injury. Advanced cervical spine degeneration. Chronic microvascular disease in the cerebral white matter CT Chest 03/15>>Extensive airspace consolidation within the LEFT upper and LEFT lower lobes consistent with pneumonia. Minimal patchy central RIGHT lower lobe infiltrates as well. Aortic atherosclerosis and coronary arterial calcification  REVIEW OF SYSTEMS:  Positives in BOLD Constitutional: fever, chills, weight loss, malaise/fatigue and diaphoresis.  HENT: Negative for hearing loss, ear pain, nosebleeds, congestion, sore throat, neck pain, tinnitus and ear discharge.   Eyes: Negative for blurred vision, double vision, photophobia, pain, discharge and redness.  Respiratory: cough, hemoptysis, sputum production, shortness of breath, wheezing and stridor.   Cardiovascular: Negative for chest pain, palpitations, orthopnea, claudication, leg swelling and PND.  Gastrointestinal: heartburn, nausea, vomiting, abdominal pain, diarrhea, constipation, blood in stool and melena.  Genitourinary: Negative for dysuria, urgency, frequency, hematuria and flank pain.  Musculoskeletal: Negative for myalgias, back pain, joint pain and falls.  Skin: Negative for itching and rash.  Neurological: Negative for  dizziness, tingling, tremors, sensory change, speech change, focal weakness, seizures, loss of consciousness, weakness and headaches.  Endo/Heme/Allergies: Negative for environmental allergies and polydipsia. Does not bruise/bleed easily.  SUBJECTIVE:  Patient is presently off BiPAP. Is tolerating okay so far. Presently eating breakfast. Last night had some difficulty with IV access and had infiltration in his right arm. Received an external jugular line placed last p.m. Also was started on a Cardizem infusion last night low dose in addition to the amiodarone. Coreg has been started this morning in an attempt to wean Cardizem infusion  VITAL SIGNS: Temp:  [97.4 F (36.3 C)-97.6 F (36.4 C)] 97.6 F (36.4 C) (03/17 0746) Pulse Rate:  [72-155] 102 (03/17 0800) Resp:  [19-33] 24 (03/17 0800) BP: (111-168)/(69-150) 124/79 (03/17 0700) SpO2:  [80 %-100 %] 99 % (03/17 0800) FiO2 (%):  [45 %-50 %] 45 % (03/17 0746)  PHYSICAL EXAMINATION: General: acutely ill appearing Caucasian elderly male Neuro: alert and oriented, follows commands  HEENT: supple, no JVD Cardiovascular: irregular, irregular, no M/R/G Lungs: rhonchi LLL and diminished throughout, even, non labored on Bipap Abdomen: obese, +BS x4, soft, non tender, non distended  Musculoskeletal: normal bulk and tone, no edema  Skin: laceration left forehead with ecchymosis    Recent Labs Lab 05/08/16 1101 05/08/16 1800 05/09/16 0247 05/10/16 0352  NA 138  --  140 137  K 2.8* 4.0 4.7 4.0  CL 106  --  104 98*  CO2 23  --  19* 27  BUN 24*  --  37* 61*  CREATININE 1.80*  --  2.53* 2.59*  GLUCOSE 119*  --  128* 187*    Recent Labs Lab 05/08/16 0952 05/09/16 0247 05/10/16 0352  HGB 13.5 13.9 11.9*  HCT 40.1 41.8 34.3*  WBC 2.8* 6.6 10.2  PLT 96* 105* 99*   Ct Head Wo Contrast  Result Date: 05/08/2016 CLINICAL DATA:  Unwitnessed  fall.  Head abrasion. EXAM: CT HEAD WITHOUT CONTRAST CT CERVICAL SPINE WITHOUT CONTRAST  TECHNIQUE: Multidetector CT imaging of the head and cervical spine was performed following the standard protocol without intravenous contrast. Multiplanar CT image reconstructions of the cervical spine were also generated. COMPARISON:  None. FINDINGS: CT HEAD FINDINGS Brain: No evidence of acute infarction, hemorrhage, hydrocephalus, extra-axial collection or mass lesion/mass effect. Moderate low-density in the cerebral white matter attributed to chronic microvascular ischemia. Vascular: Atherosclerotic calcification. Skull: Bubbles of gas in the right infratemporal and temporal fossa is intravenous. No fracture noted. Sinuses/Orbits: Generalized mild mucosal thickening. CT CERVICAL SPINE FINDINGS Alignment: No traumatic malalignment. Facet mediated anterolisthesis at C4-5, C5-6, C6-7, and C7-T1. Skull base and vertebrae: Negative for acute fracture. Soft tissues and spinal canal: No prevertebral fluid or swelling. No visible canal hematoma. Disc levels: Diffuse advanced facet arthropathy bulky spurring and multilevel listhesis. Milder disc degeneration. Advanced atlantodental degeneration with anterior arch thinning. Upper chest: No acute finding IMPRESSION: 1. No evidence of intracranial or cervical spine injury. 2. Advanced cervical spine degeneration. 3. Chronic microvascular disease in the cerebral white matter. Electronically Signed   By: [name]   On: 05/08/2016 09:42   Ct Chest Wo Contrast  Result Date: 05/08/2016 CLINICAL DATA:  Sepsis, pneumonia, hypotension, rapid atrial fibrillation, history hypertension, melanoma, diabetes mellitus, CHF, former smoker EXAM: CT CHEST WITHOUT CONTRAST TECHNIQUE: Multidetector CT imaging of the chest was performed following the standard protocol without IV contrast. Sagittal and coronal MPR images reconstructed from axial data set. COMPARISON:  Chest radiograph 05/08/2016 FINDINGS: Cardiovascular: Atherosclerotic calcifications aorta, coronary arteries,  and proximal great vessels. Upper normal caliber thoracic aorta without aneurysmal dilatation. No pericardial effusion. Mediastinum/Nodes: Scattered normal size lymph nodes at base of cervical region and retroclavicular. Probable segmental dilatation of the LEFT subclavian vein versus less likely an adjacent mildly enlarged lymph node. Scattered normal sized mediastinal lymph nodes otherwise seen. Esophagus unremarkable. Lungs/Pleura: Extensive airspace consolidation LEFT upper and LEFT lower lobes consistent with pneumonia. Mild scattered central infiltrates in RIGHT lower lobe. Underlying emphysematous changes and mild central peribronchial thickening. No definite pulmonary mass or nodule no abnormalities in LEFT lung could be obscured by infiltrate. No pleural effusion or pneumothorax. Upper Abdomen: Beam hardening artifacts secondary to the patient's arms traverse the upper abdomen. No definite upper abdominal abnormalities. Musculoskeletal: No acute osseous findings. IMPRESSION: Extensive airspace consolidation within the LEFT upper and LEFT lower lobes consistent with pneumonia. Minimal patchy central RIGHT lower lobe infiltrates as well. Aortic atherosclerosis and coronary arterial calcification. Electronically Signed   By: Lavonia Dana M.D.   On: 05/08/2016 18:41   Ct Cervical Spine Wo Contrast  Result Date: 05/08/2016 CLINICAL DATA:  Unwitnessed fall.  Head abrasion. EXAM: CT HEAD WITHOUT CONTRAST CT CERVICAL SPINE WITHOUT CONTRAST TECHNIQUE: Multidetector CT imaging of the head and cervical spine was performed following the standard protocol without intravenous contrast. Multiplanar CT image reconstructions of the cervical spine were also generated. COMPARISON:  None. FINDINGS: CT HEAD FINDINGS Brain: No evidence of acute infarction, hemorrhage, hydrocephalus, extra-axial collection or mass lesion/mass effect. Moderate low-density in the cerebral white matter attributed to chronic microvascular  ischemia. Vascular: Atherosclerotic calcification. Skull: Bubbles of gas in the right infratemporal and temporal fossa is intravenous. No fracture noted. Sinuses/Orbits: Generalized mild mucosal thickening. CT CERVICAL SPINE FINDINGS Alignment: No traumatic malalignment. Facet mediated anterolisthesis at C4-5, C5-6, C6-7, and C7-T1. Skull base and vertebrae: Negative for acute fracture. Soft tissues and spinal canal: No prevertebral fluid or  swelling. No visible canal hematoma. Disc levels: Diffuse advanced facet arthropathy bulky spurring and multilevel listhesis. Milder disc degeneration. Advanced atlantodental degeneration with anterior arch thinning. Upper chest: No acute finding IMPRESSION: 1. No evidence of intracranial or cervical spine injury. 2. Advanced cervical spine degeneration. 3. Chronic microvascular disease in the cerebral white matter. Electronically Signed   By: [name]   On: 05/08/2016 09:42   Dg Chest Port 1 View  Result Date: 05/09/2016 CLINICAL DATA:  Pneumonia, respiratory failure, atrial fibrillation and congestive heart failure. EXAM: PORTABLE CHEST 1 VIEW COMPARISON:  Chest x-ray and CT studies on 05/08/2016 FINDINGS: Airspace consolidation within the left upper lobe and lower lobe is partially masked by an overlying temporary pacing pad. Overall distribution likely similar to the chest x-ray yesterday. No overt edema or pleural effusion identified. Stable cardiac enlargement. No pneumothorax. IMPRESSION: Stable overall distribution of left lung airspace consolidation. Appearance by chest x-ray is today partially masked by an overlying temporary pacing pad. Electronically Signed   By: Aletta Edouard M.D.   On: 05/09/2016 09:51   Dg Chest Port 1 View  Result Date: 05/08/2016 CLINICAL DATA:  76 year old male with vomiting since 1815 hours yesterday. Syncope this morning. Initial encounter. EXAM: PORTABLE CHEST 1 VIEW COMPARISON:  None. FINDINGS: Portable AP semi upright  view at 0915 hours. Confluent abnormal pulmonary opacity throughout the mid and lower left lung, sparing the left costophrenic angle. No definite pleural effusion. Stable cardiomegaly and mediastinal contours. Calcified aortic atherosclerosis. Visualized tracheal air column is within normal limits. The right lung appears clear when allowing for portable technique. No pneumothorax. Negative visible bowel gas pattern. No acute osseous abnormality identified. IMPRESSION: 1. Confluent left lung opacity compatible with aspiration and/or pneumonia in this clinical setting. No associated pleural effusion. 2. Mild cardiomegaly.  Calcified aortic atherosclerosis. Electronically Signed   By: Genevie Ann M.D.   On: 05/08/2016 09:42    ASSESSMENT / PLAN: Acute Respiratory Failure secondary to pneumonia and AECOPD (Former smoker has not been officially dx with COPD) Atrial Fibrillation with RVR. He managed by cardiology, on amiodarone and Coreg added today in an attempt to wean Cardizem Acute Renal failure secondary to ATN from sepsis (baseline Cr 1.0) slight increase in BUN and creatinine hopefully it is plateauing follow closely Metabolic/lactic acidosis. Patient's bicarbonate has increased from 11.5-27. We'll stop bicarbonate infusion Elevated troponin's secondary demand ischemia due to acute respiratory failure and gastroenteritis  Gastroenteritis  Hypomagnesia-resolved  Hypokalemia-resolved  Leukopenia/thrombocytopenia Hx: Chronic diastolic CHF   Holley Broghan Pannone, DO

## 2016-05-10 NOTE — Progress Notes (Signed)
Pt had 1 1/2 hr nap after transferred to room 13. He can answer orientation questions correctly.  But tells me he sees spots on bedside table, clock and ceiling.  Also tells me about the "boys who were smoking and drinking in hallway last night."  Suspect some ICU delirium beginning. Importance of sleep discussed with family. UOP poor today.  Taking small amount of dinner tray. Says antibiotics are upsetting his stomach. Afib controlled with rate 95-115/min.  Lungs with few wheezes and diminished.

## 2016-05-10 NOTE — Progress Notes (Signed)
Reported to Dr Jefferson Fuel pt's blood pressure is trending up.  We will continue to monitor

## 2016-05-10 NOTE — Progress Notes (Signed)
[facility]  ROUNDING NOTE   Subjective:  Patient appears to be doing better today. He is now off of BiPAP. BUN up to 61, creatinine currently 2.59. Urine output was 625 cc over the preceding 24 hours.  Objective:  Vital signs in last 24 hours:  Temp:  [97.4 F (36.3 C)-97.6 F (36.4 C)] 97.6 F (36.4 C) (03/17 0746) Pulse Rate:  [72-155] 101 (03/17 0900) Resp:  [19-33] 22 (03/17 0900) BP: (111-168)/(70-150) 122/86 (03/17 0900) SpO2:  [80 %-100 %] 95 % (03/17 0900) FiO2 (%):  [45 %-50 %] 45 % (03/17 0746)  Weight change:  Filed Weights   05/08/16 0906 05/08/16 1405  Weight: 95.3 kg (210 lb) 94.1 kg (207 lb 7.3 oz)    Intake/Output: I/O last 3 completed shifts: In: 3805.1 [I.V.:3055.1; IV Piggyback:750] Out: 825 [Urine:825]   Intake/Output this shift:  Total I/O In: 240 [P.O.:240] Out: 0   Physical Exam: General: No acute distress  Head: Normocephalic, atraumatic. Moist oral mucosal membranes  Eyes: Anicteric  Neck: Supple, trachea midline  Lungs:  Left-sided rales, normal effort   Heart: S1S2 irregular  Abdomen:  Soft, nontender  Extremities: Trace peripheral edema.  Neurologic: Nonfocal, moving all four extremities  Skin: No lesions       Basic Metabolic Panel:  Recent Labs Lab 05/08/16 1101 05/08/16 1800 05/09/16 0247 05/10/16 0352  NA 138  --  140 137  K 2.8* 4.0 4.7 4.0  CL 106  --  104 98*  CO2 23  --  19* 27  GLUCOSE 119*  --  128* 187*  BUN 24*  --  37* 61*  CREATININE 1.80*  --  2.53* 2.59*  CALCIUM 7.9*  --  7.9* 7.7*  MG 1.3* 2.4  --   --   PHOS  --  2.9  --   --     Liver Function Tests:  Recent Labs Lab 05/08/16 1101  AST 43*  ALT 24  ALKPHOS 42  BILITOT 3.2*  PROT 5.9*  ALBUMIN 3.0*   No results for input(s): LIPASE, AMYLASE in the last 168 hours. No results for input(s): AMMONIA in the last 168 hours.  CBC:  Recent Labs Lab 05/07/16 2006 05/08/16 0952 05/09/16 0247 05/10/16 0352  WBC 8.8 2.8* 6.6  10.2  NEUTROABS 8.0* 2.4  --   --   HGB 13.9 13.5 13.9 11.9*  HCT 41.4 40.1 41.8 34.3*  MCV 88.1 89.5 90.0 89.6  PLT 111* 96* 105* 99*    Cardiac Enzymes:  Recent Labs Lab 05/08/16 1425 05/08/16 1800 05/08/16 2253 05/09/16 2158 05/10/16 0352  TROPONINI 0.48* 0.59* 0.69* 0.50* 0.45*    BNP: Invalid input(s): POCBNP  CBG:  Recent Labs Lab 05/09/16 0710 05/09/16 1244 05/09/16 1637 05/09/16 1951 05/10/16 0758  GLUCAP 154* 187* 166* 172* 180*    Microbiology: Results for orders placed or performed during the hospital encounter of 05/08/16  Culture, blood (routine x 2)     Status: None (Preliminary result)   Collection Time: 05/08/16  9:53 AM  Result Value Ref Range Status   Specimen Description BLOOD L ARM  Final   Special Requests BOTTLES DRAWN AEROBIC AND ANAEROBIC BCAV  Final   Culture NO GROWTH 2 DAYS  Final   Report Status PENDING  Incomplete  Culture, blood (routine x 2)     Status: None (Preliminary result)   Collection Time: 05/08/16  9:53 AM  Result Value Ref Range Status   Specimen Description BLOOD R ARM  Final  Special Requests BOTTLES DRAWN AEROBIC AND ANAEROBIC BCLV  Final   Culture NO GROWTH 2 DAYS  Final   Report Status PENDING  Incomplete  MRSA PCR Screening     Status: None   Collection Time: 05/08/16  2:31 PM  Result Value Ref Range Status   MRSA by PCR NEGATIVE NEGATIVE Final    Comment:        The GeneXpert MRSA Assay (FDA approved for NASAL specimens only), is one component of a comprehensive MRSA colonization surveillance program. It is not intended to diagnose MRSA infection nor to guide or monitor treatment for MRSA infections.   Culture, expectorated sputum-assessment     Status: None   Collection Time: 05/10/16  3:53 AM  Result Value Ref Range Status   Specimen Description SPUTUM  Final   Special Requests NONE  Final   Sputum evaluation   Final    Sputum specimen not acceptable for testing.  Please recollect.   C/DALE  HOPKINS AT 2595 05/10/16.PMH   Report Status 05/10/2016 FINAL  Final    Coagulation Studies:  Recent Labs  05/08/16 1322  LABPROT 18.6*  INR 1.54    Urinalysis: No results for input(s): COLORURINE, LABSPEC, PHURINE, GLUCOSEU, HGBUR, BILIRUBINUR, KETONESUR, PROTEINUR, UROBILINOGEN, NITRITE, LEUKOCYTESUR in the last 72 hours.  Invalid input(s): APPERANCEUR    Imaging: Ct Chest Wo Contrast  Result Date: 05/08/2016 CLINICAL DATA:  Sepsis, pneumonia, hypotension, rapid atrial fibrillation, history hypertension, melanoma, diabetes mellitus, CHF, former smoker EXAM: CT CHEST WITHOUT CONTRAST TECHNIQUE: Multidetector CT imaging of the chest was performed following the standard protocol without IV contrast. Sagittal and coronal MPR images reconstructed from axial data set. COMPARISON:  Chest radiograph 05/08/2016 FINDINGS: Cardiovascular: Atherosclerotic calcifications aorta, coronary arteries, and proximal great vessels. Upper normal caliber thoracic aorta without aneurysmal dilatation. No pericardial effusion. Mediastinum/Nodes: Scattered normal size lymph nodes at base of cervical region and retroclavicular. Probable segmental dilatation of the LEFT subclavian vein versus less likely an adjacent mildly enlarged lymph node. Scattered normal sized mediastinal lymph nodes otherwise seen. Esophagus unremarkable. Lungs/Pleura: Extensive airspace consolidation LEFT upper and LEFT lower lobes consistent with pneumonia. Mild scattered central infiltrates in RIGHT lower lobe. Underlying emphysematous changes and mild central peribronchial thickening. No definite pulmonary mass or nodule no abnormalities in LEFT lung could be obscured by infiltrate. No pleural effusion or pneumothorax. Upper Abdomen: Beam hardening artifacts secondary to the patient's arms traverse the upper abdomen. No definite upper abdominal abnormalities. Musculoskeletal: No acute osseous findings. IMPRESSION: Extensive airspace  consolidation within the LEFT upper and LEFT lower lobes consistent with pneumonia. Minimal patchy central RIGHT lower lobe infiltrates as well. Aortic atherosclerosis and coronary arterial calcification. Electronically Signed   By: Lavonia Dana M.D.   On: 05/08/2016 18:41   Dg Chest Port 1 View  Result Date: 05/09/2016 CLINICAL DATA:  Pneumonia, respiratory failure, atrial fibrillation and congestive heart failure. EXAM: PORTABLE CHEST 1 VIEW COMPARISON:  Chest x-ray and CT studies on 05/08/2016 FINDINGS: Airspace consolidation within the left upper lobe and lower lobe is partially masked by an overlying temporary pacing pad. Overall distribution likely similar to the chest x-ray yesterday. No overt edema or pleural effusion identified. Stable cardiac enlargement. No pneumothorax. IMPRESSION: Stable overall distribution of left lung airspace consolidation. Appearance by chest x-ray is today partially masked by an overlying temporary pacing pad. Electronically Signed   By: Aletta Edouard M.D.   On: 05/09/2016 09:51     Medications:   . amiodarone 60 mg/hr (05/10/16 0746)  .  diltiazem (CARDIZEM) infusion Stopped (05/10/16 0944)  . heparin 1,500 Units/hr (05/10/16 0743)   . budesonide (PULMICORT) nebulizer solution  0.5 mg Nebulization BID  . carvedilol  6.25 mg Oral BID WC  . ceFEPime (MAXIPIME) IV  1 g Intravenous Q24H  . chlorhexidine  15 mL Mouth Rinse BID  . doxycycline (VIBRAMYCIN) IV  100 mg Intravenous Q12H  . famotidine  20 mg Oral QHS  . guaiFENesin  600 mg Oral BID  . insulin aspart  0-5 Units Subcutaneous QHS  . insulin aspart  0-9 Units Subcutaneous TID WC  . ipratropium-albuterol  3 mL Nebulization Q6H  . mouth rinse  15 mL Mouth Rinse q12n4p  . methylPREDNISolone (SOLU-MEDROL) injection  40 mg Intravenous Q12H  . sodium chloride flush  3 mL Intravenous Q12H  . tamsulosin  0.4 mg Oral Daily  . vancomycin  1,250 mg Intravenous Q24H   acetaminophen **OR** acetaminophen,  ondansetron **OR** ondansetron (ZOFRAN) IV, traZODone  Assessment/ Plan:  76 y.o. male with a PMHx of Atrial fibrillation, BPH, congestive heart failure, coronary artery disease, depression, diabetes mellitus type 2, hypertension, hyperlipidemia, history of malignant melanoma who was admitted to [facility] on 05/08/2016 for evaluation of fall.  Pt found to have extensive left sided pneumonia with sepsis, acute respiratory failure, acute renal failure.   1.  Acute renal failure due to ATN from sepsis. (baseline Cr 1.0) 2.  Acute respiratory failure due to left sided pneumonia. 3.  Metabolic/lactic acidosis. 4.  Atrial fibrillation with RVR. 5.  Sepsis  Plan:  Patient appears to have improved today. His renal function is about the same as yesterday. BUN higher likely secondary to Solu-Medrol. Urine output was 625 cc over the preceding 24 hours. No urgent indication for dialysis at the moment however if he becomes oliguric we may need to still consider this. Overall his respiratory status has improved now that he is off of BiPAP. Continue supportive care with antibodies as per pulmonary/critical care. Agree with discontinuation of bicarbonate drip as his serum bicarbonate has improved significantly.   LOS: 2 Todd Valentine 3/17/201810:19 AM

## 2016-05-10 NOTE — Progress Notes (Signed)
Pharmacy Antibiotic Note  Todd Valentine is a 76 y.o. male admitted on 05/08/2016 with sepsis secondary to  pneumonia.  Pharmacy has been consulted for cefepime and vancomycin dosing.  Patient started on doxycycline 100mg  IV Q12hr to cover atypicals on 3/16.   Plan: Day 3- cefepime 2g IV Q24hr. Will transition patient to 1g IV Q24hr for    renal function < 20mL/min.  Day 3-Continue vancomycin 1250 mg IV Q24hr for goal trough of 15-20. Will continue to monitor renal function. Will obtain trough prior todose on 3/17. MRSA is negative - per rounding team due to severity of patient's illness and ICU status will continue vancomycin at this time.   3/17 1951 vancomycin trough 22 mcg/mL. Decrease to 1 gm IV Q24H - will recheck vancomycin trough before fourth dose of decreased regimen.     Height: 5\' 9"  (175.3 cm) Weight: 207 lb 7.3 oz (94.1 kg) IBW/kg (Calculated) : 70.7  Temp (24hrs), Avg:98 F (36.7 C), Min:97.6 F (36.4 C), Max:98.4 F (36.9 C)   Recent Labs Lab 05/07/16 2006 05/08/16 0952 05/08/16 1101  05/08/16 1800 05/08/16 2253 05/09/16 0247 05/09/16 2158 05/10/16 0037 05/10/16 0352 05/10/16 1951  WBC 8.8 2.8*  --   --   --   --  6.6  --   --  10.2  --   CREATININE  --   --  1.80*  --   --   --  2.53*  --   --  2.59*  --   LATICACIDVEN  --  3.9*  --   < > 5.3* 4.1* 6.2* 2.1* 1.8  --   --   VANCOTROUGH  --   --   --   --   --   --   --   --   --   --  22*  < > = values in this interval not displayed.  Estimated Creatinine Clearance: 27.9 mL/min (A) (by C-G formula based on SCr of 2.59 mg/dL (H)).    Allergies  Allergen Reactions  . Ciprofloxacin     Other reaction(s): Other (See Comments) Pt states he could hardly move  . Sitagliptin     Other reaction(s): Other (See Comments) Weakness    Antimicrobials this admission: 3/15 Zosyn x 1 dose  3/15 cefepime >> 3/15 vancomycin >> 3/16 doxycycline >>  Dose adjustments this admission:  Microbiology  results: 3/15 BCx: no growth < 24 hours 3/15  MRSA PCR: negative  Thank you for allowing pharmacy to be a part of this patient's care.  Laural Benes, Pharm.D., BCPS Clinical Pharmacist 05/10/2016 8:15 PM

## 2016-05-10 NOTE — Progress Notes (Addendum)
Todd Valentine for electrolyte management   Pharmacy consulted for electrolyte management for 76 yo male admitted with PNA/Afib.  Plan:  No replacement warranted at this time. Pharmacy will continue to monitor and adjust per consult.     Allergies  Allergen Reactions  . Ciprofloxacin     Other reaction(s): Other (See Comments) Pt states he could hardly move  . Sitagliptin     Other reaction(s): Other (See Comments) Weakness    Patient Measurements: Height: 5\' 9"  (175.3 cm) Weight: 207 lb 7.3 oz (94.1 kg) IBW/kg (Calculated) : 70.7   Vital Signs: Temp: 97.6 F (36.4 C) (03/17 0746) Temp Source: Oral (03/17 0746) BP: 124/79 (03/17 0700) Pulse Rate: 102 (03/17 0800) Intake/Output from previous day: 03/16 0701 - 03/17 0700 In: 3805.1 [I.V.:3055.1; IV Piggyback:750] Out: 625 [Urine:625] Intake/Output from this shift: No intake/output data recorded.  Labs:  Recent Labs  05/08/16 0952 05/08/16 1101 05/08/16 1322 05/08/16 1800 05/09/16 0247 05/10/16 0352  WBC 2.8*  --   --   --  6.6 10.2  HGB 13.5  --   --   --  13.9 11.9*  HCT 40.1  --   --   --  41.8 34.3*  PLT 96*  --   --   --  105* 99*  APTT  --   --  51*  --   --   --   CREATININE  --  1.80*  --   --  2.53* 2.59*  MG  --  1.3*  --  2.4  --   --   PHOS  --   --   --  2.9  --   --   ALBUMIN  --  3.0*  --   --   --   --   PROT  --  5.9*  --   --   --   --   AST  --  43*  --   --   --   --   ALT  --  24  --   --   --   --   ALKPHOS  --  42  --   --   --   --   BILITOT  --  3.2*  --   --   --   --    Estimated Creatinine Clearance: 27.9 mL/min (A) (by C-G formula based on SCr of 2.59 mg/dL (H)).  Lab Results  Component Value Date   K 4.0 05/10/2016     Medical History: Past Medical History:  Diagnosis Date  . Acute renal failure ([place])   . BPH (benign prostatic hyperplasia)   . Chronic atrial fibrillation ([place])   . Chronic diastolic CHF (congestive heart  failure) ([name])    a. echo as above  . Chronic insomnia   . Coronary artery disease, non-occlusive 10/06/2013   a. cath 09/2013: pLAD 40, CTO Diag, ramus 40-->80, OM 50 mRCA 40, med rx  . Depression   . Diabetes mellitus with complication ([place])   . History of diverticulitis   . Hyperkalemia   . Hyperlipidemia   . Hypertension   . Malignant melanoma ([place])   . Mitral regurgitation    a. echo 09/2013: EF 55-60%, mildly dilated left atrrium, mild to moderate MR/TR   Pharmacy will continue to monitor and adjust per consult.   Nate Common A 05/10/2016,9:29 AM

## 2016-05-10 NOTE — Progress Notes (Signed)
Pharmacy Antibiotic Note  Todd Valentine is a 76 y.o. male admitted on 05/08/2016 with sepsis secondary to  pneumonia.  Pharmacy has been consulted for cefepime and vancomycin dosing.  Patient started on doxycycline 100mg  IV Q12hr to cover atypicals on 3/16.   Plan: Day 3- cefepime 2g IV Q24hr. Will transition patient to 1g IV Q24hr for    renal function < 29mL/min.  Day 3-Continue vancomycin 1250 mg IV Q24hr for goal trough of 15-20. Will continue to monitor renal function. Will obtain trough prior todose on 3/17. MRSA is negative - per rounding team due to severity of patient's illness and ICU status will continue vancomycin at this time.     Height: 5\' 9"  (175.3 cm) Weight: 207 lb 7.3 oz (94.1 kg) IBW/kg (Calculated) : 70.7  Temp (24hrs), Avg:97.6 F (36.4 C), Min:97.4 F (36.3 C), Max:97.6 F (36.4 C)   Recent Labs Lab 05/07/16 2006 05/08/16 0952 05/08/16 1101  05/08/16 1800 05/08/16 2253 05/09/16 0247 05/09/16 2158 05/10/16 0037 05/10/16 0352  WBC 8.8 2.8*  --   --   --   --  6.6  --   --  10.2  CREATININE  --   --  1.80*  --   --   --  2.53*  --   --  2.59*  LATICACIDVEN  --  3.9*  --   < > 5.3* 4.1* 6.2* 2.1* 1.8  --   < > = values in this interval not displayed.  Estimated Creatinine Clearance: 27.9 mL/min (A) (by C-G formula based on SCr of 2.59 mg/dL (H)).    Allergies  Allergen Reactions  . Ciprofloxacin     Other reaction(s): Other (See Comments) Pt states he could hardly move  . Sitagliptin     Other reaction(s): Other (See Comments) Weakness    Antimicrobials this admission: 3/15 Zosyn x 1 dose  3/15 cefepime >> 3/15 vancomycin >> 3/16 doxycycline >>  Dose adjustments this admission:  Microbiology results: 3/15 BCx: no growth < 24 hours 3/15  MRSA PCR: negative  Thank you for allowing pharmacy to be a part of this patient's care.  Shanvi Moyd A,  05/10/2016 9:40 AM

## 2016-05-10 NOTE — Progress Notes (Signed)
Dr Darvin Neighbours called regarding continued elevated bp despite clonidine and coreg.  Afib 97-115/min. Pt sleeping.  Order for labetalol 10 mg IV if SBP greater than 160. Follow up SBP 150. Labetalol not needed currently.

## 2016-05-10 NOTE — Progress Notes (Signed)
ANTICOAGULATION CONSULT NOTE - Follow Up Consult  Pharmacy Consult for Heparin drip  Indication: atrial fibrillation  Allergies  Allergen Reactions  . Ciprofloxacin     Other reaction(s): Other (See Comments) Pt states he could hardly move  . Sitagliptin     Other reaction(s): Other (See Comments) Weakness    Patient Measurements: Height: 5\' 9"  (175.3 cm) Weight: 207 lb 7.3 oz (94.1 kg) IBW/kg (Calculated) : 70.7 Heparin Dosing Weight: 90 kg  Vital Signs: Temp: 97.9 F (36.6 C) (03/17 2013) Temp Source: Oral (03/17 2013) BP: 150/111 (03/17 1700) Pulse Rate: 107 (03/17 1700)  Labs:  Recent Labs  05/08/16 0952 05/08/16 1101 05/08/16 1322  05/09/16 0247  05/09/16 2158 05/10/16 0352 05/10/16 1236 05/10/16 1951  HGB 13.5  --   --   --  13.9  --   --  11.9*  --   --   HCT 40.1  --   --   --  41.8  --   --  34.3*  --   --   PLT 96*  --   --   --  105*  --   --  99*  --   --   APTT  --   --  51*  --   --   --   --   --   --   --   LABPROT  --   --  18.6*  --   --   --   --   --   --   --   INR  --   --  1.54  --   --   --   --   --   --   --   HEPARINUNFRC  --   --  <0.10*  < >  --   < >  --  0.21* 0.26* 0.48  CREATININE  --  1.80*  --   --  2.53*  --   --  2.59*  --   --   TROPONINI  --  0.42*  --   < >  --   --  0.50* 0.45* 0.43*  --   < > = values in this interval not displayed.  Estimated Creatinine Clearance: 27.9 mL/min (A) (by C-G formula based on SCr of 2.59 mg/dL (H)).   Assessment: 76 yo male with PMH of A. Fib. Patient has been taking Dabigatran BID at home until he runs out of current prescription- then plan was for him to start apixaban 5mg  BID. Patient dose not know when he took his last dose of Dabigatran, but guesses it was probably on 3/13 at some point.   Goal of Therapy: Heparin level 0.3-0.7 units/ml aPTT 66-102seconds Monitor platelets by anticoagulation protocol: Yes  Plan: Continue heparin infusion at current rate and recheck a HL in  8 hours.   3/17 HL @ 0400 0.21 subtherapeutic. Will bolus w/ 1400 units x 1 and increase drip rate to 1500 units/hour and recheck HL @ 1200. Will continue to monitor CBC.  3/17 HL @ 1236= 0.26.  Will give bolus of 1300 units and increase Heparin to 1700 units/hr. Recheck HL at 1930.  3/17 1951 HL therapeutic x 1. Continue current rate. Will recheck HL in 8 hours.  Laural Benes, Pharm.D., BCPS Clinical Pharmacist 05/10/2016,8:16 PM

## 2016-05-10 NOTE — Plan of Care (Signed)
Problem: Fluid Volume: Goal: Ability to maintain a balanced intake and output will improve Outcome: Progressing Minimal improvement of UOP. Will continue to monitor closely  Problem: Respiratory: Goal: Ability to achieve and maintain a regular respiratory rate will improve Outcome: Progressing Weaned from bipap Goal: Ability to maintain a clear airway will improve Outcome: Progressing Weaned from bipap Goal: Ability to maintain normal oxygenation or baseline will improve Outcome: Progressing Weaned from bipap, monitoring today before dc the order Goal: Verbalizations of increased ease of respirations will increase Outcome: Progressing Weaned from bipap. Shortness of breath has improved

## 2016-05-10 NOTE — Progress Notes (Signed)
ANTICOAGULATION CONSULT NOTE - Initial Consult  Pharmacy Consult for heparin drip  Indication: atrial fibrillation       Allergies  Allergen Reactions  . Ciprofloxacin     Other reaction(s): Other (See Comments) Pt states he could hardly move  . Sitagliptin     Other reaction(s): Other (See Comments) Weakness    Patient Measurements: Height: 5\' 9"  (175.3 cm) Weight: 207 lb 7.3 oz (94.1 kg) IBW/kg (Calculated) : 70.7 Heparin Dosing Weight: 90kg  Vital Signs: Temp: 98 F (36.7 C) (03/15 1945) Temp Source: Oral (03/15 1945) BP: 127/73 (03/15 1900) Pulse Rate: 141 (03/15 1900)  Labs:  Recent Labs (last 2 labs)    Recent Labs  05/07/16 2006 05/08/16 0952  05/08/16 1101 05/08/16 1322 05/08/16 1425 05/08/16 1800 05/08/16 2253  HGB 13.9 13.5  --   --   --   --   --   --   HCT 41.4 40.1  --   --   --   --   --   --   PLT 111* 96*  --   --   --   --   --   --   APTT  --   --   --   --  51*  --   --   --   LABPROT  --   --   --   --  18.6*  --   --   --   INR  --   --   --   --  1.54  --   --   --   HEPARINUNFRC  --   --   --   --  <0.10*  --   --  0.51  CREATININE  --   --   --  1.80*  --   --   --   --   TROPONINI  --   --   < > 0.42*  --  0.48* 0.59* 0.69*  < > = values in this interval not displayed.    Estimated Creatinine Clearance: 40.2 mL/min (A) (by C-G formula based on SCr of 1.8 mg/dL (H)).   Medical History:     Past Medical History:  Diagnosis Date  . Acute renal failure ([place])   . BPH (benign prostatic hyperplasia)   . Chronic atrial fibrillation ([place])   . Chronic diastolic CHF (congestive heart failure) ([name])    a. echo as above  . Chronic insomnia   . Coronary artery disease, non-occlusive 10/06/2013   a. cath 09/2013: pLAD 40, CTO Diag, ramus 40-->80, OM 50 mRCA 40, med rx  . Depression   . Diabetes mellitus with complication ([place])   . History of diverticulitis   . Hyperkalemia   . Hyperlipidemia   .  Hypertension   . Malignant melanoma ([place])   . Mitral regurgitation    a. echo 09/2013: EF 55-60%, mildly dilated left atrrium, mild to moderate MR/TR    Assessment: 76 yo male with PMH of A. Fib. Patient has been taking Dabigatran BID at home until he runs out of current prescription- then plan was for him to start apixaban 5mg  BID. Patient dose not know when he took his last dose of Dabigatran, but guesses it was probably on 3/13 at some point.   Goal of Therapy:  Heparin level 0.3-0.7 units/ml aPTT 66-102 seconds Monitor platelets by anticoagulation protocol: Yes   Plan:  Continue heparin infusion at current rate and recheck a HL in 8 hours.  3/17 HL @ 0400 0.21 subtherapeutic. Will bolus w/ 1400 units x 1 and increase drip rate to 1500 units/hour and recheck HL @ 1200. Will continue to monitor CBC.  Thank you for this consult.  Tobie Lords, PharmD, BCPS Clinical Pharmacist 05/10/2016

## 2016-05-10 NOTE — Progress Notes (Signed)
Protective pad place on nose

## 2016-05-10 NOTE — Progress Notes (Signed)
Progress Note  Patient Name: Todd Valentine Date of Encounter: 05/10/2016  Primary Cardiologist: Rockey Situ  Subjective   On nasal cannula this morning, eating breakfast Reports he feels overall ok, just very tired No CP. He feels the mucus in lungs is breaking up, and he is expectorating more  Coreg PO started yesterday Still on Amio gtt, Cardizem at 5, Heparin IV gtt  Inpatient Medications    Scheduled Meds: . budesonide (PULMICORT) nebulizer solution  0.5 mg Nebulization BID  . carvedilol  6.25 mg Oral BID WC  . ceFEPIme (MAXIPIME) 2 GM IVP  2 g Intravenous q1800  . chlorhexidine  15 mL Mouth Rinse BID  . doxycycline (VIBRAMYCIN) IV  100 mg Intravenous Q12H  . famotidine  20 mg Oral QHS  . guaiFENesin  600 mg Oral BID  . insulin aspart  0-5 Units Subcutaneous QHS  . insulin aspart  0-9 Units Subcutaneous TID WC  . ipratropium-albuterol  3 mL Nebulization Q4H  . mouth rinse  15 mL Mouth Rinse q12n4p  . methylPREDNISolone (SOLU-MEDROL) injection  40 mg Intravenous Q12H  . sodium chloride flush  3 mL Intravenous Q12H  . tamsulosin  0.4 mg Oral Daily  . vancomycin  1,250 mg Intravenous Q24H   Continuous Infusions: . amiodarone 60 mg/hr (05/10/16 0746)  . diltiazem (CARDIZEM) infusion 5 mg/hr (05/10/16 0154)  . heparin 1,500 Units/hr (05/10/16 0743)  .  sodium bicarbonate  infusion 1000 mL 50 mL/hr at 05/09/16 1931   PRN Meds: acetaminophen **OR** acetaminophen, ondansetron **OR** ondansetron (ZOFRAN) IV, traZODone   Vital Signs    Vitals:   05/10/16 0600 05/10/16 0700 05/10/16 0746 05/10/16 0800  BP: 139/90 124/79    Pulse: 98 (!) 102  (!) 102  Resp: (!) 21 (!) 24  (!) 24  Temp:   97.6 F (36.4 C)   TempSrc:   Oral   SpO2: 97% 100% 100% 99%  Weight:      Height:        Intake/Output Summary (Last 24 hours) at 05/10/16 0909 Last data filed at 05/10/16 0700  Gross per 24 hour  Intake          3018.16 ml  Output              625 ml  Net          2393.16  ml   Filed Weights   05/08/16 0906 05/08/16 1405  Weight: 210 lb (95.3 kg) 207 lb 7.3 oz (94.1 kg)    Telemetry    atiral fibrillation with VR in 90s-110s - Personally Reviewed  ECG      Physical Exam    PHYSICAL EXAM: VS:  BP 124/79   Pulse (!) 102   Temp 97.6 F (36.4 C) (Oral)   Resp (!) 24   Ht 5\' 9"  (1.753 m)   Wt 207 lb 7.3 oz (94.1 kg)   SpO2 99%   BMI 30.64 kg/m  , BMI Body mass index is 30.64 kg/m. GENERAL:  well developed, well nourished, obese, not in acute distress HEENT: normocephalic, pink conjunctivae, anicteric sclerae, no xanthelasma, normal dentition, oropharynx clear NECK:  no neck vein engorgement, JVP normal, no hepatojugular reflux, carotid upstroke brisk and symmetric, no bruit, no thyromegaly, no lymphadenopathy LUNGS:  good respiratory effort, clear to auscultation generally with occasional coarse crackles on L side CV:  PMI not displaced, no thrills, no lifts, irregular rhythm, S2 within normal limits, no palpable S3 or S4, no murmurs, no  rubs, no gallops ABD:  Soft, nontender, nondistended, normoactive bowel sounds, no abdominal aortic bruit MS: nontender back, no kyphosis, no scoliosis, no joint deformities EXT:  2+ DP/PT pulses, no edema, no varicosities, no cyanosis, no clubbing. R arm generalized swelling compared to L arm (IV infiltration) SKIN: warm, nondiaphoretic, normal turgor, no ulcers NEUROPSYCH: alert, oriented to person, place, and time, sensory/motor grossly intact, normal mood, appropriate affect    Labs    Chemistry  Recent Labs Lab 05/08/16 1101 05/08/16 1800 05/09/16 0247 05/10/16 0352  NA 138  --  140 137  K 2.8* 4.0 4.7 4.0  CL 106  --  104 98*  CO2 23  --  19* 27  GLUCOSE 119*  --  128* 187*  BUN 24*  --  37* 61*  CREATININE 1.80*  --  2.53* 2.59*  CALCIUM 7.9*  --  7.9* 7.7*  PROT 5.9*  --   --   --   ALBUMIN 3.0*  --   --   --   AST 43*  --   --   --   ALT 24  --   --   --   ALKPHOS 42  --   --   --     BILITOT 3.2*  --   --   --   GFRNONAA 35*  --  23* 23*  GFRAA 41*  --  27* 26*  ANIONGAP 9  --  17* 12     Hematology  Recent Labs Lab 05/08/16 0952 05/09/16 0247 05/10/16 0352  WBC 2.8* 6.6 10.2  RBC 4.49 4.65 3.83*  HGB 13.5 13.9 11.9*  HCT 40.1 41.8 34.3*  MCV 89.5 90.0 89.6  MCH 30.1 30.0 31.1  MCHC 33.6 33.3 34.7  RDW 13.8 14.0 13.9  PLT 96* 105* 99*    Cardiac Enzymes  Recent Labs Lab 05/08/16 1800 05/08/16 2253 05/09/16 2158 05/10/16 0352  TROPONINI 0.59* 0.69* 0.50* 0.45*   No results for input(s): TROPIPOC in the last 168 hours.   BNPNo results for input(s): BNP, PROBNP in the last 168 hours.   DDimer No results for input(s): DDIMER in the last 168 hours.   Radiology    Ct Head Wo Contrast  Result Date: 05/08/2016 CLINICAL DATA:  Unwitnessed fall.  Head abrasion. EXAM: CT HEAD WITHOUT CONTRAST CT CERVICAL SPINE WITHOUT CONTRAST TECHNIQUE: Multidetector CT imaging of the head and cervical spine was performed following the standard protocol without intravenous contrast. Multiplanar CT image reconstructions of the cervical spine were also generated. COMPARISON:  None. FINDINGS: CT HEAD FINDINGS Brain: No evidence of acute infarction, hemorrhage, hydrocephalus, extra-axial collection or mass lesion/mass effect. Moderate low-density in the cerebral white matter attributed to chronic microvascular ischemia. Vascular: Atherosclerotic calcification. Skull: Bubbles of gas in the right infratemporal and temporal fossa is intravenous. No fracture noted. Sinuses/Orbits: Generalized mild mucosal thickening. CT CERVICAL SPINE FINDINGS Alignment: No traumatic malalignment. Facet mediated anterolisthesis at C4-5, C5-6, C6-7, and C7-T1. Skull base and vertebrae: Negative for acute fracture. Soft tissues and spinal canal: No prevertebral fluid or swelling. No visible canal hematoma. Disc levels: Diffuse advanced facet arthropathy bulky spurring and multilevel listhesis. Milder  disc degeneration. Advanced atlantodental degeneration with anterior arch thinning. Upper chest: No acute finding IMPRESSION: 1. No evidence of intracranial or cervical spine injury. 2. Advanced cervical spine degeneration. 3. Chronic microvascular disease in the cerebral white matter. Electronically Signed   By: [name]   On: 05/08/2016 09:42   Ct Chest Wo Contrast  Result Date: 05/08/2016 CLINICAL DATA:  Sepsis, pneumonia, hypotension, rapid atrial fibrillation, history hypertension, melanoma, diabetes mellitus, CHF, former smoker EXAM: CT CHEST WITHOUT CONTRAST TECHNIQUE: Multidetector CT imaging of the chest was performed following the standard protocol without IV contrast. Sagittal and coronal MPR images reconstructed from axial data set. COMPARISON:  Chest radiograph 05/08/2016 FINDINGS: Cardiovascular: Atherosclerotic calcifications aorta, coronary arteries, and proximal great vessels. Upper normal caliber thoracic aorta without aneurysmal dilatation. No pericardial effusion. Mediastinum/Nodes: Scattered normal size lymph nodes at base of cervical region and retroclavicular. Probable segmental dilatation of the LEFT subclavian vein versus less likely an adjacent mildly enlarged lymph node. Scattered normal sized mediastinal lymph nodes otherwise seen. Esophagus unremarkable. Lungs/Pleura: Extensive airspace consolidation LEFT upper and LEFT lower lobes consistent with pneumonia. Mild scattered central infiltrates in RIGHT lower lobe. Underlying emphysematous changes and mild central peribronchial thickening. No definite pulmonary mass or nodule no abnormalities in LEFT lung could be obscured by infiltrate. No pleural effusion or pneumothorax. Upper Abdomen: Beam hardening artifacts secondary to the patient's arms traverse the upper abdomen. No definite upper abdominal abnormalities. Musculoskeletal: No acute osseous findings. IMPRESSION: Extensive airspace consolidation within the LEFT upper  and LEFT lower lobes consistent with pneumonia. Minimal patchy central RIGHT lower lobe infiltrates as well. Aortic atherosclerosis and coronary arterial calcification. Electronically Signed   By: Lavonia Dana M.D.   On: 05/08/2016 18:41   Ct Cervical Spine Wo Contrast  Result Date: 05/08/2016 CLINICAL DATA:  Unwitnessed fall.  Head abrasion. EXAM: CT HEAD WITHOUT CONTRAST CT CERVICAL SPINE WITHOUT CONTRAST TECHNIQUE: Multidetector CT imaging of the head and cervical spine was performed following the standard protocol without intravenous contrast. Multiplanar CT image reconstructions of the cervical spine were also generated. COMPARISON:  None. FINDINGS: CT HEAD FINDINGS Brain: No evidence of acute infarction, hemorrhage, hydrocephalus, extra-axial collection or mass lesion/mass effect. Moderate low-density in the cerebral white matter attributed to chronic microvascular ischemia. Vascular: Atherosclerotic calcification. Skull: Bubbles of gas in the right infratemporal and temporal fossa is intravenous. No fracture noted. Sinuses/Orbits: Generalized mild mucosal thickening. CT CERVICAL SPINE FINDINGS Alignment: No traumatic malalignment. Facet mediated anterolisthesis at C4-5, C5-6, C6-7, and C7-T1. Skull base and vertebrae: Negative for acute fracture. Soft tissues and spinal canal: No prevertebral fluid or swelling. No visible canal hematoma. Disc levels: Diffuse advanced facet arthropathy bulky spurring and multilevel listhesis. Milder disc degeneration. Advanced atlantodental degeneration with anterior arch thinning. Upper chest: No acute finding IMPRESSION: 1. No evidence of intracranial or cervical spine injury. 2. Advanced cervical spine degeneration. 3. Chronic microvascular disease in the cerebral white matter. Electronically Signed   By: [name]   On: 05/08/2016 09:42   Dg Chest Port 1 View  Result Date: 05/09/2016 CLINICAL DATA:  Pneumonia, respiratory failure, atrial fibrillation and  congestive heart failure. EXAM: PORTABLE CHEST 1 VIEW COMPARISON:  Chest x-ray and CT studies on 05/08/2016 FINDINGS: Airspace consolidation within the left upper lobe and lower lobe is partially masked by an overlying temporary pacing pad. Overall distribution likely similar to the chest x-ray yesterday. No overt edema or pleural effusion identified. Stable cardiac enlargement. No pneumothorax. IMPRESSION: Stable overall distribution of left lung airspace consolidation. Appearance by chest x-ray is today partially masked by an overlying temporary pacing pad. Electronically Signed   By: Aletta Edouard M.D.   On: 05/09/2016 09:51   Dg Chest Port 1 View  Result Date: 05/08/2016 CLINICAL DATA:  76 year old male with vomiting since 1815 hours yesterday. Syncope this morning. Initial encounter. EXAM:  PORTABLE CHEST 1 VIEW COMPARISON:  None. FINDINGS: Portable AP semi upright view at 0915 hours. Confluent abnormal pulmonary opacity throughout the mid and lower left lung, sparing the left costophrenic angle. No definite pleural effusion. Stable cardiomegaly and mediastinal contours. Calcified aortic atherosclerosis. Visualized tracheal air column is within normal limits. The right lung appears clear when allowing for portable technique. No pneumothorax. Negative visible bowel gas pattern. No acute osseous abnormality identified. IMPRESSION: 1. Confluent left lung opacity compatible with aspiration and/or pneumonia in this clinical setting. No associated pleural effusion. 2. Mild cardiomegaly.  Calcified aortic atherosclerosis. Electronically Signed   By: Genevie Ann M.D.   On: 05/08/2016 09:42    Cardiac Studies   Echo 05/08/2016 reviewed personally: EF 50-55% Mild MR LA mildly dilated RV sys function mildly reduced  Patient Profile     76 year old gentleman with chronic atrial fibrillation, last seen April 25 2016 In clinic at which time he was doing well, prior history chronic diastolic CHF, type 2 diabetes,  three-vessel coronary disease by catheterization August 2015, presenting with chills, fever, malaise, shortness of breath consistent with pneumonia, atrial fibrillation with RVR heart rate up to 170 bpm  Assessment & Plan    ----Acute respiratory failure PNA, sepsis On [place] now Treatment per primary team: abx, nebulizers  ----Atrial fibrillation with RVR, hx of chronic afib Increased ventricular rate secondary to PNA, sepsis HR improved overall Continue Amiodarone gtt Coreg was started yesterday per primary team Rec to wean off Cardizem gtt If issues arise with low BP, consider cardizem po 30mg  q6hrs instead of nonselective BB (Coreg) Continue Heparin gtt  As per 05/09/2016: Echocardiogram showing low normal ejection fraction Pradaxa held on March 13 by the patient when he did not feel well  -----Acute renal failure Crea slowly rising Followed by Nephrology  ---Elevated troponin Hx of CAD on cath 2015, no angina Troponin elevation from demand ischemia in setting of sepsis, tachycardia, hypoxia, also from acute renal dysfunction Trop downtrending Continue treatment of underlying medical issues, continue medical therapy Pt already on Heparin gtt EF 50-55% Consider resuming statin therapy (pt was on simvastatin 40mg  po qhs)  ---HTN BP wnl. Cont current meds.   I spent at least 35 minutes in the care of the patient today and more than 50% of the time was spent counseling the patient and coordinating care.    Signed, Wende Bushy, MD  05/10/2016, 9:09 AM

## 2016-05-10 NOTE — Progress Notes (Addendum)
ANTICOAGULATION CONSULT NOTE - Follow Up Consult  Pharmacy Consult for Heparin drip  Indication: atrial fibrillation  Allergies  Allergen Reactions  . Ciprofloxacin     Other reaction(s): Other (See Comments) Pt states he could hardly move  . Sitagliptin     Other reaction(s): Other (See Comments) Weakness    Patient Measurements: Height: 5\' 9"  (175.3 cm) Weight: 207 lb 7.3 oz (94.1 kg) IBW/kg (Calculated) : 70.7 Heparin Dosing Weight: 90 kg  Vital Signs: Temp: 97.6 F (36.4 C) (03/17 0746) Temp Source: Oral (03/17 0746) BP: 137/88 (03/17 1200) Pulse Rate: 109 (03/17 1200)  Labs:  Recent Labs  05/08/16 0952 05/08/16 1101 05/08/16 1322  05/09/16 0247 05/09/16 0737 05/09/16 2158 05/10/16 0352 05/10/16 1236  HGB 13.5  --   --   --  13.9  --   --  11.9*  --   HCT 40.1  --   --   --  41.8  --   --  34.3*  --   PLT 96*  --   --   --  105*  --   --  99*  --   APTT  --   --  51*  --   --   --   --   --   --   LABPROT  --   --  18.6*  --   --   --   --   --   --   INR  --   --  1.54  --   --   --   --   --   --   HEPARINUNFRC  --   --  <0.10*  < >  --  0.48  --  0.21* 0.26*  CREATININE  --  1.80*  --   --  2.53*  --   --  2.59*  --   TROPONINI  --  0.42*  --   < >  --   --  0.50* 0.45* 0.43*  < > = values in this interval not displayed.  Estimated Creatinine Clearance: 27.9 mL/min (A) (by C-G formula based on SCr of 2.59 mg/dL (H)).   Assessment: 76 yo male with PMH of A. Fib. Patient has been taking Dabigatran BID at home until he runs out of current prescription- then plan was for him to start apixaban 5mg  BID. Patient dose not know when he took his last dose of Dabigatran, but guesses it was probably on 3/13 at some point.   Goal of Therapy: Heparin level 0.3-0.7 units/ml aPTT 66-102seconds Monitor platelets by anticoagulation protocol: Yes  Plan: Continue heparin infusion at current rate and recheck a HL in 8 hours.   3/17 HL @ 0400 0.21  subtherapeutic. Will bolus w/ 1400 units x 1 and increase drip rate to 1500 units/hour and recheck HL @ 1200. Will continue to monitor CBC.  3/17 HL @ 1236= 0.26.  Will give bolus of 1300 units and increase Heparin to 1700 units/hr. Recheck HL at 1930.  Glenna Brunkow A 05/10/2016,1:21 PM

## 2016-05-10 NOTE — Progress Notes (Signed)
[place] [facility] [place] NAME: Todd Valentine    MR#:  [id number]  DATE OF BIRTH:  08/22/1940  SUBJECTIVE:  CHIEF COMPLAINT:   Chief Complaint  Patient presents with  . Loss of Consciousness   On Bipap Afib rate 120s  Afebrile Wants to eat UOP 625 ml  REVIEW OF SYSTEMS:    Review of Systems  Constitutional: Positive for malaise/fatigue. Negative for chills and fever.  HENT: Negative for sore throat.   Eyes: Negative for blurred vision, double vision and pain.  Respiratory: Positive for cough and shortness of breath. Negative for hemoptysis and wheezing.   Cardiovascular: Negative for chest pain, palpitations, orthopnea and leg swelling.  Gastrointestinal: Negative for abdominal pain, constipation, diarrhea, heartburn, nausea and vomiting.  Genitourinary: Negative for dysuria and hematuria.  Musculoskeletal: Negative for back pain and joint pain.  Skin: Negative for rash.  Neurological: Positive for weakness. Negative for sensory change, speech change, focal weakness and headaches.  Endo/Heme/Allergies: Does not bruise/bleed easily.  Psychiatric/Behavioral: Negative for depression. The patient is not nervous/anxious.     DRUG ALLERGIES:   Allergies  Allergen Reactions  . Ciprofloxacin     Other reaction(s): Other (See Comments) Pt states he could hardly move  . Sitagliptin     Other reaction(s): Other (See Comments) Weakness    VITALS:  Blood pressure 124/79, pulse (!) 102, temperature 97.6 F (36.4 C), temperature source Oral, resp. rate (!) 24, height 5\' 9"  (1.753 m), weight 94.1 kg (207 lb 7.3 oz), SpO2 99 %.  PHYSICAL EXAMINATION:   Physical Exam  GENERAL:  76 y.o.-year-old patient lying in the bed. Bipap. Looks critically ill EYES: Pupils equal, round, reactive to light and accommodation. No scleral icterus. Extraocular muscles intact.  HEENT: Head atraumatic, normocephalic. Oropharynx and nasopharynx clear.  NECK:   Supple, no jugular venous distention. No thyroid enlargement, no tenderness.  LUNGS: Normal breath sounds bilaterally, no wheezing, rales, rhonchi. No use of accessory muscles of respiration.  CARDIOVASCULAR: Irregularly irregular, tachycardia ABDOMEN: Soft, nontender, nondistended. Bowel sounds present. No organomegaly or mass.  EXTREMITIES: No cyanosis, clubbing or edema b/l.    NEUROLOGIC: Cranial nerves II through XII are intact. No focal Motor or sensory deficits b/l.   PSYCHIATRIC: The patient is alert and oriented x 3.  SKIN: No obvious rash, lesion, or ulcer.   LABORATORY PANEL:   CBC  Recent Labs Lab 05/10/16 0352  WBC 10.2  HGB 11.9*  HCT 34.3*  PLT 99*   ------------------------------------------------------------------------------------------------------------------ Chemistries   Recent Labs Lab 05/08/16 1101 05/08/16 1800  05/10/16 0352  NA 138  --   < > 137  K 2.8* 4.0  < > 4.0  CL 106  --   < > 98*  CO2 23  --   < > 27  GLUCOSE 119*  --   < > 187*  BUN 24*  --   < > 61*  CREATININE 1.80*  --   < > 2.59*  CALCIUM 7.9*  --   < > 7.7*  MG 1.3* 2.4  --   --   AST 43*  --   --   --   ALT 24  --   --   --   ALKPHOS 42  --   --   --   BILITOT 3.2*  --   --   --   < > = values in this interval not displayed. ------------------------------------------------------------------------------------------------------------------  Cardiac Enzymes  Recent Labs Lab 05/10/16  9924  TROPONINI 0.45*   ------------------------------------------------------------------------------------------------------------------  RADIOLOGY:  Ct Head Wo Contrast  Result Date: 05/08/2016 CLINICAL DATA:  Unwitnessed fall.  Head abrasion. EXAM: CT HEAD WITHOUT CONTRAST CT CERVICAL SPINE WITHOUT CONTRAST TECHNIQUE: Multidetector CT imaging of the head and cervical spine was performed following the standard protocol without intravenous contrast. Multiplanar CT image reconstructions of the  cervical spine were also generated. COMPARISON:  None. FINDINGS: CT HEAD FINDINGS Brain: No evidence of acute infarction, hemorrhage, hydrocephalus, extra-axial collection or mass lesion/mass effect. Moderate low-density in the cerebral white matter attributed to chronic microvascular ischemia. Vascular: Atherosclerotic calcification. Skull: Bubbles of gas in the right infratemporal and temporal fossa is intravenous. No fracture noted. Sinuses/Orbits: Generalized mild mucosal thickening. CT CERVICAL SPINE FINDINGS Alignment: No traumatic malalignment. Facet mediated anterolisthesis at C4-5, C5-6, C6-7, and C7-T1. Skull base and vertebrae: Negative for acute fracture. Soft tissues and spinal canal: No prevertebral fluid or swelling. No visible canal hematoma. Disc levels: Diffuse advanced facet arthropathy bulky spurring and multilevel listhesis. Milder disc degeneration. Advanced atlantodental degeneration with anterior arch thinning. Upper chest: No acute finding IMPRESSION: 1. No evidence of intracranial or cervical spine injury. 2. Advanced cervical spine degeneration. 3. Chronic microvascular disease in the cerebral white matter. Electronically Signed   By: [name]   On: 05/08/2016 09:42   Ct Chest Wo Contrast  Result Date: 05/08/2016 CLINICAL DATA:  Sepsis, pneumonia, hypotension, rapid atrial fibrillation, history hypertension, melanoma, diabetes mellitus, CHF, former smoker EXAM: CT CHEST WITHOUT CONTRAST TECHNIQUE: Multidetector CT imaging of the chest was performed following the standard protocol without IV contrast. Sagittal and coronal MPR images reconstructed from axial data set. COMPARISON:  Chest radiograph 05/08/2016 FINDINGS: Cardiovascular: Atherosclerotic calcifications aorta, coronary arteries, and proximal great vessels. Upper normal caliber thoracic aorta without aneurysmal dilatation. No pericardial effusion. Mediastinum/Nodes: Scattered normal size lymph nodes at base of  cervical region and retroclavicular. Probable segmental dilatation of the LEFT subclavian vein versus less likely an adjacent mildly enlarged lymph node. Scattered normal sized mediastinal lymph nodes otherwise seen. Esophagus unremarkable. Lungs/Pleura: Extensive airspace consolidation LEFT upper and LEFT lower lobes consistent with pneumonia. Mild scattered central infiltrates in RIGHT lower lobe. Underlying emphysematous changes and mild central peribronchial thickening. No definite pulmonary mass or nodule no abnormalities in LEFT lung could be obscured by infiltrate. No pleural effusion or pneumothorax. Upper Abdomen: Beam hardening artifacts secondary to the patient's arms traverse the upper abdomen. No definite upper abdominal abnormalities. Musculoskeletal: No acute osseous findings. IMPRESSION: Extensive airspace consolidation within the LEFT upper and LEFT lower lobes consistent with pneumonia. Minimal patchy central RIGHT lower lobe infiltrates as well. Aortic atherosclerosis and coronary arterial calcification. Electronically Signed   By: Lavonia Dana M.D.   On: 05/08/2016 18:41   Ct Cervical Spine Wo Contrast  Result Date: 05/08/2016 CLINICAL DATA:  Unwitnessed fall.  Head abrasion. EXAM: CT HEAD WITHOUT CONTRAST CT CERVICAL SPINE WITHOUT CONTRAST TECHNIQUE: Multidetector CT imaging of the head and cervical spine was performed following the standard protocol without intravenous contrast. Multiplanar CT image reconstructions of the cervical spine were also generated. COMPARISON:  None. FINDINGS: CT HEAD FINDINGS Brain: No evidence of acute infarction, hemorrhage, hydrocephalus, extra-axial collection or mass lesion/mass effect. Moderate low-density in the cerebral white matter attributed to chronic microvascular ischemia. Vascular: Atherosclerotic calcification. Skull: Bubbles of gas in the right infratemporal and temporal fossa is intravenous. No fracture noted. Sinuses/Orbits: Generalized mild  mucosal thickening. CT CERVICAL SPINE FINDINGS Alignment: No traumatic malalignment. Facet mediated anterolisthesis at  C4-5, C5-6, C6-7, and C7-T1. Skull base and vertebrae: Negative for acute fracture. Soft tissues and spinal canal: No prevertebral fluid or swelling. No visible canal hematoma. Disc levels: Diffuse advanced facet arthropathy bulky spurring and multilevel listhesis. Milder disc degeneration. Advanced atlantodental degeneration with anterior arch thinning. Upper chest: No acute finding IMPRESSION: 1. No evidence of intracranial or cervical spine injury. 2. Advanced cervical spine degeneration. 3. Chronic microvascular disease in the cerebral white matter. Electronically Signed   By: [name]   On: 05/08/2016 09:42   Dg Chest Port 1 View  Result Date: 05/09/2016 CLINICAL DATA:  Pneumonia, respiratory failure, atrial fibrillation and congestive heart failure. EXAM: PORTABLE CHEST 1 VIEW COMPARISON:  Chest x-ray and CT studies on 05/08/2016 FINDINGS: Airspace consolidation within the left upper lobe and lower lobe is partially masked by an overlying temporary pacing pad. Overall distribution likely similar to the chest x-ray yesterday. No overt edema or pleural effusion identified. Stable cardiac enlargement. No pneumothorax. IMPRESSION: Stable overall distribution of left lung airspace consolidation. Appearance by chest x-ray is today partially masked by an overlying temporary pacing pad. Electronically Signed   By: Aletta Edouard M.D.   On: 05/09/2016 09:51   Dg Chest Port 1 View  Result Date: 05/08/2016 CLINICAL DATA:  76 year old male with vomiting since 1815 hours yesterday. Syncope this morning. Initial encounter. EXAM: PORTABLE CHEST 1 VIEW COMPARISON:  None. FINDINGS: Portable AP semi upright view at 0915 hours. Confluent abnormal pulmonary opacity throughout the mid and lower left lung, sparing the left costophrenic angle. No definite pleural effusion. Stable cardiomegaly  and mediastinal contours. Calcified aortic atherosclerosis. Visualized tracheal air column is within normal limits. The right lung appears clear when allowing for portable technique. No pneumothorax. Negative visible bowel gas pattern. No acute osseous abnormality identified. IMPRESSION: 1. Confluent left lung opacity compatible with aspiration and/or pneumonia in this clinical setting. No associated pleural effusion. 2. Mild cardiomegaly.  Calcified aortic atherosclerosis. Electronically Signed   By: Genevie Ann M.D.   On: 05/08/2016 09:42     ASSESSMENT AND PLAN:   76 year old male with past history of chronic atrial fibrillation, hypertension, hyperlipidemia, diabetes, history of coronary artery disease, history of congestive heart failure, BPH who presents to the hospital due to a fall and weakness and noted to be hypotensive.  * Bilateral pneumonia with severe sepsis and acute hypoxic resp failure On IV abx, nebs. Cx NGTD Bipap. Wean Bipap to [place] for trial Critically ill  * Atrial fibrillation with rapid ventricular response due to sepsis On amiodarone/cardizem infusion Coreg PO Heparin infusion  * AKI due to ATN/sepsis Appreciate nephrology input On bicarb drip May need CRRT if no improvement  * Metabolic acidodid due to lactic acid Resolved  * Elevated Trop Due to demand ischemia and Afib. Not MI  * Hypokalemia  Replaced  * Diabetes type 2 without complication-hold metformin, Amaryl  on sliding scale insulin.  * Hyperlipidemia-continue simvastatin.  * Essential hypertension On Coreg and Cardizem  Discussed with Dr. Jefferson Fuel  All the records are reviewed and case discussed with Care Management/Social Worker Management plans discussed with the patient, family and they are in agreement.  CODE STATUS: Partial code  Code status discussed with patient  DVT Prophylaxis: SCDs  TOTAL CC TIME TAKING CARE OF THIS PATIENT: 35 minutes.   Hillary Bow R M.D on  05/10/2016 at 8:16 AM  Between 7am to 6pm - Pager - [phone]  After 6pm go to [email or web address] - [place]  [facility]  Office  [phone]  CC: Primary care physician; [name], MD  Note: This dictation was prepared with Dragon dictation along with smaller phrase technology. Any transcriptional errors that result from this process are unintentional.

## 2016-05-11 ENCOUNTER — Inpatient Hospital Stay: Payer: Medicare Other

## 2016-05-11 ENCOUNTER — Encounter: Payer: Self-pay | Admitting: Adult Health

## 2016-05-11 DIAGNOSIS — I248 Other forms of acute ischemic heart disease: Secondary | ICD-10-CM

## 2016-05-11 DIAGNOSIS — I10 Essential (primary) hypertension: Secondary | ICD-10-CM

## 2016-05-11 LAB — RENAL FUNCTION PANEL
Albumin: 2.6 g/dL — ABNORMAL LOW (ref 3.5–5.0)
Albumin: 2.5 g/dL — ABNORMAL LOW (ref 3.5–5.0)
Albumin: 2.7 g/dL — ABNORMAL LOW (ref 3.5–5.0)
Anion gap: 9 (ref 5–15)
Anion gap: 7 (ref 5–15)
Anion gap: 8 (ref 5–15)
BUN: 86 mg/dL — ABNORMAL HIGH (ref 6–20)
BUN: 74 mg/dL — ABNORMAL HIGH (ref 6–20)
BUN: 67 mg/dL — ABNORMAL HIGH (ref 6–20)
CO2: 25 mmol/L (ref 22–32)
CO2: 26 mmol/L (ref 22–32)
CO2: 26 mmol/L (ref 22–32)
Calcium: 7.7 mg/dL — ABNORMAL LOW (ref 8.9–10.3)
Calcium: 8.1 mg/dL — ABNORMAL LOW (ref 8.9–10.3)
Calcium: 8.2 mg/dL — ABNORMAL LOW (ref 8.9–10.3)
Chloride: 97 mmol/L — ABNORMAL LOW (ref 101–111)
Chloride: 99 mmol/L — ABNORMAL LOW (ref 101–111)
Chloride: 100 mmol/L — ABNORMAL LOW (ref 101–111)
Creatinine, Ser: 2.86 mg/dL — ABNORMAL HIGH (ref 0.61–1.24)
Creatinine, Ser: 2.47 mg/dL — ABNORMAL HIGH (ref 0.61–1.24)
Creatinine, Ser: 2.24 mg/dL — ABNORMAL HIGH (ref 0.61–1.24)
GFR calc Af Amer: 23 mL/min — ABNORMAL LOW (ref >60)
GFR calc Af Amer: 28 mL/min — ABNORMAL LOW (ref >60)
GFR calc Af Amer: 31 mL/min — ABNORMAL LOW (ref >60)
GFR calc non Af Amer: 20 mL/min — ABNORMAL LOW (ref >60)
GFR calc non Af Amer: 24 mL/min — ABNORMAL LOW (ref >60)
GFR calc non Af Amer: 27 mL/min — ABNORMAL LOW (ref >60)
Glucose, Bld: 185 mg/dL — ABNORMAL HIGH (ref 65–99)
Glucose, Bld: 149 mg/dL — ABNORMAL HIGH (ref 65–99)
Glucose, Bld: 118 mg/dL — ABNORMAL HIGH (ref 65–99)
Phosphorus: 5.8 mg/dL — ABNORMAL HIGH (ref 2.5–4.6)
Phosphorus: 5 mg/dL — ABNORMAL HIGH (ref 2.5–4.6)
Phosphorus: 4.2 mg/dL (ref 2.5–4.6)
Potassium: 4.3 mmol/L (ref 3.5–5.1)
Potassium: 4.3 mmol/L (ref 3.5–5.1)
Potassium: 4.4 mmol/L (ref 3.5–5.1)
Sodium: 131 mmol/L — ABNORMAL LOW (ref 135–145)
Sodium: 132 mmol/L — ABNORMAL LOW (ref 135–145)
Sodium: 134 mmol/L — ABNORMAL LOW (ref 135–145)

## 2016-05-11 LAB — BLOOD GAS, ARTERIAL
Acid-base deficit: 2.7 mmol/L — ABNORMAL HIGH (ref 0.0–2.0)
Acid-base deficit: 3.2 mmol/L — ABNORMAL HIGH (ref 0.0–2.0)
Bicarbonate: 25.4 mmol/L (ref 20.0–28.0)
Bicarbonate: 27.9 mmol/L (ref 20.0–28.0)
Delivery systems: POSITIVE
Expiratory PAP: 8
FIO2: 0.5
FIO2: 0.5
Inspiratory PAP: 18
O2 Saturation: 88.1 %
O2 Saturation: 97.2 %
Patient temperature: 37
Patient temperature: 37
pCO2 arterial: 58 mmHg — ABNORMAL HIGH (ref 32.0–48.0)
pCO2 arterial: 84 mmHg (ref 32.0–48.0)
pH, Arterial: 7.13 — CL (ref 7.350–7.450)
pH, Arterial: 7.25 — ABNORMAL LOW (ref 7.350–7.450)
pO2, Arterial: 106 mmHg (ref 83.0–108.0)
pO2, Arterial: 72 mmHg — ABNORMAL LOW (ref 83.0–108.0)

## 2016-05-11 LAB — CBC
HCT: 34 % — ABNORMAL LOW (ref 40.0–52.0)
Hemoglobin: 11.4 g/dL — ABNORMAL LOW (ref 13.0–18.0)
MCH: 29.7 pg (ref 26.0–34.0)
MCHC: 33.6 g/dL (ref 32.0–36.0)
MCV: 88.5 fL (ref 80.0–100.0)
Platelets: 103 10*3/uL — ABNORMAL LOW (ref 150–440)
RBC: 3.84 MIL/uL — ABNORMAL LOW (ref 4.40–5.90)
RDW: 14.3 % (ref 11.5–14.5)
WBC: 12.8 10*3/uL — ABNORMAL HIGH (ref 3.8–10.6)

## 2016-05-11 LAB — URINE CULTURE: Culture: NO GROWTH

## 2016-05-11 LAB — LACTIC ACID, PLASMA: Lactic Acid, Venous: 1.3 mmol/L (ref 0.5–1.9)

## 2016-05-11 LAB — MAGNESIUM: Magnesium: 2.5 mg/dL — ABNORMAL HIGH (ref 1.7–2.4)

## 2016-05-11 LAB — TROPONIN I
Troponin I: 0.55 ng/mL (ref <0.03)
Troponin I: 0.38 ng/mL (ref <0.03)
Troponin I: 0.42 ng/mL (ref <0.03)

## 2016-05-11 LAB — BASIC METABOLIC PANEL
Anion gap: 14 (ref 5–15)
BUN: 89 mg/dL — ABNORMAL HIGH (ref 6–20)
CO2: 25 mmol/L (ref 22–32)
Calcium: 8 mg/dL — ABNORMAL LOW (ref 8.9–10.3)
Chloride: 93 mmol/L — ABNORMAL LOW (ref 101–111)
Creatinine, Ser: 3.19 mg/dL — ABNORMAL HIGH (ref 0.61–1.24)
GFR calc Af Amer: 20 mL/min — ABNORMAL LOW (ref >60)
GFR calc non Af Amer: 18 mL/min — ABNORMAL LOW (ref >60)
Glucose, Bld: 231 mg/dL — ABNORMAL HIGH (ref 65–99)
Potassium: 4.5 mmol/L (ref 3.5–5.1)
Sodium: 132 mmol/L — ABNORMAL LOW (ref 135–145)

## 2016-05-11 LAB — HEPARIN LEVEL (UNFRACTIONATED)
Heparin Unfractionated: 3.34 IU/mL — ABNORMAL HIGH (ref 0.30–0.70)
Heparin Unfractionated: 0.26 IU/mL — ABNORMAL LOW (ref 0.30–0.70)

## 2016-05-11 LAB — BRAIN NATRIURETIC PEPTIDE: B Natriuretic Peptide: 459 pg/mL — ABNORMAL HIGH (ref 0.0–100.0)

## 2016-05-11 LAB — GLUCOSE, CAPILLARY
Glucose-Capillary: 219 mg/dL — ABNORMAL HIGH (ref 65–99)
Glucose-Capillary: 171 mg/dL — ABNORMAL HIGH (ref 65–99)
Glucose-Capillary: 180 mg/dL — ABNORMAL HIGH (ref 65–99)
Glucose-Capillary: 149 mg/dL — ABNORMAL HIGH (ref 65–99)
Glucose-Capillary: 116 mg/dL — ABNORMAL HIGH (ref 65–99)

## 2016-05-11 LAB — PROCALCITONIN: Procalcitonin: 78.18 ng/mL

## 2016-05-11 MED ORDER — PUREFLOW DIALYSIS SOLUTION
INTRAVENOUS | Status: DC
  Administered 2016-05-11: 3 via INTRAVENOUS_CENTRAL
  Administered 2016-05-11: 14:00:00 via INTRAVENOUS_CENTRAL
  Administered 2016-05-12 (×2): 3 via INTRAVENOUS_CENTRAL

## 2016-05-11 MED ORDER — HEPARIN (PORCINE) IN NACL 100-0.45 UNIT/ML-% IJ SOLN
1500.0000 [IU]/h | INTRAMUSCULAR | Status: DC
  Administered 2016-05-11 (×2): 1300 [IU]/h via INTRAVENOUS
  Administered 2016-05-13 – 2016-05-16 (×4): 1500 [IU]/h via INTRAVENOUS
  Filled 2016-05-11 (×7): qty 250

## 2016-05-11 MED ORDER — SODIUM CHLORIDE 0.9 % IV BOLUS (SEPSIS)
1000.0000 mL | INTRAVENOUS | Status: AC
  Administered 2016-05-11 (×2): 1000 mL via INTRAVENOUS

## 2016-05-11 MED ORDER — SODIUM CHLORIDE 0.9 % IV BOLUS (SEPSIS)
500.0000 mL | INTRAVENOUS | Status: AC
  Administered 2016-05-11: 500 mL via INTRAVENOUS

## 2016-05-11 MED ORDER — CARVEDILOL 6.25 MG PO TABS
12.5000 mg | ORAL_TABLET | Freq: Two times a day (BID) | ORAL | Status: DC
  Administered 2016-05-11 – 2016-05-12 (×2): 12.5 mg via ORAL
  Filled 2016-05-11 (×2): qty 2

## 2016-05-11 MED ORDER — HEPARIN SODIUM (PORCINE) 1000 UNIT/ML DIALYSIS
1000.0000 [IU] | INTRAMUSCULAR | Status: DC | PRN
  Filled 2016-05-11: qty 10

## 2016-05-11 NOTE — Progress Notes (Addendum)
Pharmacy Antibiotic Note  [name] is a 76 y.o. male admitted on 05/08/2016 with pneumonia.  Pharmacy has been consulted for zosyn dosing.  Patient also on Doxycyline  Plan: Zosyn 3.375g IV q8h (4 hour infusion).  Patient suspected to have aspirated Watch fenal fxn  Addendum :  Patient to now start on CRRT 3/18.   Height: 5\' 9"  (175.3 cm) Weight: 207 lb 7.3 oz (94.1 kg) IBW/kg (Calculated) : 70.7  Temp (24hrs), Avg:97.9 F (36.6 C), Min:97.5 F (36.4 C), Max:98.4 F (36.9 C)   Recent Labs Lab 05/07/16 2006 05/08/16 0952 05/08/16 1101  05/08/16 2253 05/09/16 0247 05/09/16 2158 05/10/16 0037 05/10/16 0352 05/10/16 1951 05/11/16 0512 05/11/16 0827  WBC 8.8 2.8*  --   --   --  6.6  --   --  10.2  --  12.8*  --   CREATININE  --   --  1.80*  --   --  2.53*  --   --  2.59*  --  3.19*  --   LATICACIDVEN  --  3.9*  --   < > 4.1* 6.2* 2.1* 1.8  --   --   --  1.3  VANCOTROUGH  --   --   --   --   --   --   --   --   --  22*  --   --   < > = values in this interval not displayed.  Estimated Creatinine Clearance: 22.7 mL/min (A) (by C-G formula based on SCr of 3.19 mg/dL (H)).    Allergies  Allergen Reactions  . Ciprofloxacin     Other reaction(s): Other (See Comments) Pt states he could hardly move  . Sitagliptin     Other reaction(s): Other (See Comments) Weakness    Antimicrobials this admission: 3/17 DXY >> 3/17 Zosyn >>  3/17 CFP >> 3/17 3/17 Vanc >> 3/17  Microbiology results: 3/15 BCx: NG 3/16 UCx: Sent  3/17 Sputum: NG  3/15 MRSA PCR: NG  Thank you for allowing pharmacy to be a part of this patient's care.  Chinita Greenland PharmD Clinical Pharmacist 05/11/2016

## 2016-05-11 NOTE — Progress Notes (Addendum)
Name: Todd Valentine MRN: [id number] DOB: 09-11-1940    ADMISSION DATE:  05/08/2016 CONSULTATION DATE: 05/08/2016  REFERRING MD :  Dr. Verdell Carmine  CHIEF COMPLAINT: Loss of Consciousness   BRIEF PATIENT DESCRIPTION:  76 yo male with septic shock secondary to pneumonia, acute respiratory failure secondary to pneumonia and AECOPD, afibb with RVR, leukopenia/thrombocytopenia, hypokalemia, hypomagnesia, and gastroeteritis    SUBJECTIVE:  Severely agitated at the beginning of shift. Morphine given with good effect. ABG shos hypercapnia. Family hesitant about BiPAP hence he was left on high flow  nasal canula. Repeat gas shows worsening hypercapnia hence he was placed on BiPAP. Foley inserted for strict Is/Os. Now calm and sleeping. Found out from family that patient has a h/o OSA but is non-complaint with BiPAP at home.   SIGNIFICANT EVENTS  03/15-Pt admitted to [facility] step[facility] unit due to afibb with RVR, acute respiratory failure, gastroenteritis, and septic shock PCCM consulted for additional management   STUDIES:  CT Cervical Spine and Head 03/15>>No evidence of intracranial or cervical spine injury. Advanced cervical spine degeneration. Chronic microvascular disease in the cerebral white matter CT Chest 03/15>>Extensive airspace consolidation within the LEFT upper and LEFT lower lobes consistent with pneumonia. Minimal patchy central RIGHT lower lobe infiltrates as well. Aortic atherosclerosis and coronary arterial calcification  REVIEW OF SYSTEMS:  Unable to obtain as patient is confused and agitated.   VITAL SIGNS: Temp:  [97.6 F (36.4 C)-98.4 F (36.9 C)] 97.7 F (36.5 C) (03/18 0000) Pulse Rate:  [84-114] 88 (03/18 0600) Resp:  [16-35] 22 (03/18 0600) BP: (87-170)/(59-119) 129/90 (03/18 0600) SpO2:  [90 %-100 %] 97 % (03/18 0600) FiO2 (%):  [45 %-50 %] 50 % (03/17 2340)  PHYSICAL EXAMINATION: General: acutely ill appearing Caucasian elderly male, restless and confused Neuro:  alert and oriented to person only, follows basic commands,  HEENT: [place]/AT, oral mucosa dry, neck is supple, no JVD, trachea midline Cardiovascular: irregular, irregular, no M/R/G Lungs: BiPAP, bilateral breath sounds, diminished in the bases, non labored on Bipap Abdomen: obese, +BS x4, soft, non tender, non distended  Musculoskeletal: normal bulk and tone, no edema  Skin: laceration left forehead with ecchymosis    Recent Labs Lab 05/09/16 0247 05/10/16 0352 05/11/16 0512  NA 140 137 132*  K 4.7 4.0 4.5  CL 104 98* 93*  CO2 19* 27 25  BUN 37* 61* 89*  CREATININE 2.53* 2.59* 3.19*  GLUCOSE 128* 187* 231*    Recent Labs Lab 05/09/16 0247 05/10/16 0352 05/11/16 0512  HGB 13.9 11.9* 11.4*  HCT 41.8 34.3* 34.0*  WBC 6.6 10.2 12.8*  PLT 105* 99* 103*   Dg Chest Port 1 View  Result Date: 05/10/2016 CLINICAL DATA:  Sepsis EXAM: PORTABLE CHEST 1 VIEW COMPARISON:  05/09/2016 FINDINGS: Shallow inspiration. Cardiac enlargement. Persistent consolidation in the left middle lung consistent with pneumonia. Developing atelectasis or infiltration in the right lung base. No pneumothorax. No blunting of costophrenic angles. Calcification of the aorta. IMPRESSION: No change in left mid lung consolidation. Developing infiltration or atelectasis in the right lung base. Cardiac enlargement. Electronically Signed   By: Lucienne Capers M.D.   On: 05/10/2016 21:47   Dg Chest Port 1 View  Result Date: 05/09/2016 CLINICAL DATA:  Pneumonia, respiratory failure, atrial fibrillation and congestive heart failure. EXAM: PORTABLE CHEST 1 VIEW COMPARISON:  Chest x-ray and CT studies on 05/08/2016 FINDINGS: Airspace consolidation within the left upper lobe and lower lobe is partially masked by an overlying temporary pacing pad. Overall distribution  likely similar to the chest x-ray yesterday. No overt edema or pleural effusion identified. Stable cardiac enlargement. No pneumothorax. IMPRESSION: Stable overall  distribution of left lung airspace consolidation. Appearance by chest x-ray is today partially masked by an overlying temporary pacing pad. Electronically Signed   By: Aletta Edouard M.D.   On: 05/09/2016 09:51    ASSESSMENT / PLAN: Acute Respiratory Failure secondary to pneumonia, and AECOPD (Former smoker has not been officially dx with COPD) OSA on home CPAP Possible aspiration Atrial Fibrillation with RVR-currently rate controlled  Acute Renal failure secondary to ATN from sepsis (baseline Cr 1.0-now up to 3.2) Metabolic/lactic acidosis  Elevated troponin's secondary demand ischemia due to acute respiratory failure and gastroenteritis  Gastroenteritis  Leukopenia/thrombocytopenia Hx: Chronic diastolic CHF  Dysphagia likely due to acute illness  P: Cont Bipap with Supplemental O2 to maintain O2 sats 90%-94%; transition to Hi-Flow or [place] as tolerated. Mandatory BiPAP at HS Abx changed to zosyn to cover aspiration Change diet to puree with nectar thickened liquids until seen by speech therapy for a swallow evaluation Trend WBC and monitor fever curve Trend PCT's and lactic acid Follow cultures  Aggressive bronchodilator therapy and pulmonary hygiene IV steroid's   Repeat CXR in am 03/19 Continue Heparin and Amiodarone gtt per Cardiology recommendations Diltiazem discontinued 3/17 Cardiology and Nephrology consulted appreciate input  Per Nephrology if pt remains oliguric pt may need dialysis Continue continue Sodium Bicarb gtt for now  IV fluid boluses 1L X2 now Check proBNP Trend BMP's Replace electrolytes as indicated  Trend CBC Monitor for s/sx of bleeding  Transfuse for hgb <7   Patient's family updated at bedside. Daughter who HCPOA is here and has articulated patient's wishes.   Plan of care discussed with Dr. Marylouise Stacks. Tukov ANP-BC Pulmonary and [place] Pager [phone] or [phone]   Pulmonary/critical care  attending  I have personally seen and examined Mr. Rallis, I reviewed, revised and confirm Harless Nakayama ANP note, please see for full detail. Patient with worsening hypercapnic respiratory failure. Apparently he has a diagnosis of obstructive sleep apnea and was not wearing noninvasive ventilation. It has improved since he has been placed back on noninvasive ventilation. BUN/creatinine continued to worsen however, no metabolic acidosis or hyperkalemia. If renal status is not improved may require hemodialysis.  Hermelinda Dellen, D.O.

## 2016-05-11 NOTE — Progress Notes (Signed)
   05/11/16 1849  Vitals  Temp (!) 96.5 F (35.8 C)  Temp Source Axillary  Pulse Rate 91  ECG Heart Rate 81  Resp (!) 22  Oxygen Therapy  SpO2 97 %     Cont on warming blanket

## 2016-05-11 NOTE — Progress Notes (Signed)
ANTICOAGULATION CONSULT NOTE - Follow Up Consult  Pharmacy Consult for Heparin drip             Indication: atrial fibrillation       Allergies  Allergen Reactions  . Ciprofloxacin     Other reaction(s): Other (See Comments) Pt states he could hardly move  . Sitagliptin     Other reaction(s): Other (See Comments) Weakness    Patient Measurements: Height: 5\' 9"  (175.3 cm) Weight: 207 lb 7.3 oz (94.1 kg) IBW/kg (Calculated) : 70.7 Heparin Dosing Weight: 90 kg  Vital Signs: Temp: 97.9 F (36.6 C) (03/17 2013) Temp Source: Oral (03/17 2013) BP: 150/111 (03/17 1700) Pulse Rate: 107 (03/17 1700)  Labs:  Recent Labs (last 2 labs)    Recent Labs  05/08/16 0952 05/08/16 1101 05/08/16 1322  05/09/16 0247  05/09/16 2158 05/10/16 0352 05/10/16 1236 05/10/16 1951  HGB 13.5  --   --   --  13.9  --   --  11.9*  --   --   HCT 40.1  --   --   --  41.8  --   --  34.3*  --   --   PLT 96*  --   --   --  105*  --   --  99*  --   --   APTT  --   --  51*  --   --   --   --   --   --   --   LABPROT  --   --  18.6*  --   --   --   --   --   --   --   INR  --   --  1.54  --   --   --   --   --   --   --   HEPARINUNFRC  --   --  <0.10*  < >  --   < >  --  0.21* 0.26* 0.48  CREATININE  --  1.80*  --   --  2.53*  --   --  2.59*  --   --   TROPONINI  --  0.42*  --   < >  --   --  0.50* 0.45* 0.43*  --   < > = values in this interval not displayed.    Estimated Creatinine Clearance: 27.9 mL/min (A) (by C-G formula based on SCr of 2.59 mg/dL (H)).   Assessment: 76 yo male with PMH of A. Fib. Patient has been taking Dabigatran BID at home until he runs out of current prescription- then plan was for him to start apixaban 5mg  BID. Patient dose not know when he took his last dose of Dabigatran, but guesses it was probably on 3/13 at some point.   Goal of Therapy: Heparin level 0.3-0.7 units/ml aPTT 66-102seconds Monitor platelets by anticoagulation protocol:  Yes  Plan: Continue heparin infusion at current rate and recheck a HL in 8 hours.   3/17 HL @ 0400 0.21 subtherapeutic. Will bolus w/ 1400 units x 1 and increase drip rate to 1500 units/hour and recheck HL @ 1200. Will continue to monitor CBC.  3/17 HL @ 1236= 0.26.  Will give bolus of 1300 units and increase Heparin to 1700 units/hr. Recheck HL at 1930.  3/17 1951 HL therapeutic x 1. Continue current rate. Will recheck HL in 8 hours.  3/18 @ 0500 HL 3.3 supratherapeutic. Will hold heparin drip for an hour and restart  drip at 1300 units/hr starting 3/18 @ 0900 and will recheck HL @ 1700. Will continue to monitor CBC.  Thank you for this consult.  Tobie Lords, PharmD, BCPS Clinical Pharmacist 05/11/2016

## 2016-05-11 NOTE — Progress Notes (Signed)
This note also relates to the following rows which could not be included: BP - Cannot attach notes to unvalidated device data MAP (mmHg) - Cannot attach notes to unvalidated device data Pulse Rate - Cannot attach notes to unvalidated device data ECG Heart Rate - Cannot attach notes to unvalidated device data Resp - Cannot attach notes to unvalidated device data SpO2 - Cannot attach notes to unvalidated device data    05/11/16 1700  Vitals  Temp (!) 96.2 F (35.7 C)  Temp Source Axillary  Oxygen Therapy  O2 Device HFNC  Heater temperature 87.8 F (31 C)  O2 Flow Rate (L/min) 45 L/min  FiO2 (%) 56 %  Pulse Oximetry Type Continuous  Oximetry Probe Site Changed Yes  Pain Assessment  Pain Assessment No/denies pain  Glasgow Coma Scale  Eye Opening 4  Best Verbal Response (NON-intubated) 5  Best Motor Response 6  Glasgow Coma Scale Score 15     Warming blanket applied

## 2016-05-11 NOTE — Progress Notes (Signed)
Nursing may draw labs from purple port on trialysis catheter per Dr. Holley Raring.

## 2016-05-11 NOTE — Progress Notes (Signed)
[facility]  ROUNDING NOTE   Subjective:  Patient seen at bedside. His respiratory status is worse this a.m. He is now back on BiPAP. His urine output was only 496 cc over the preceding 24 hours. BUN up to 89 and creatinine is up to 3.19. We previously discussed the possibility of continuous renal replacement therapy. This was discussed again today and the patient is willing to undergo CRRT at this time.  Objective:  Vital signs in last 24 hours:  Temp:  [97.5 F (36.4 C)-98.4 F (36.9 C)] 97.5 F (36.4 C) (03/18 0800) Pulse Rate:  [84-114] 88 (03/18 0600) Resp:  [16-35] 22 (03/18 0600) BP: (87-170)/(59-119) 129/90 (03/18 0600) SpO2:  [91 %-100 %] 96 % (03/18 0738) FiO2 (%):  [40 %-50 %] 40 % (03/18 0800)  Weight change:  Filed Weights   05/08/16 0906 05/08/16 1405  Weight: 95.3 kg (210 lb) 94.1 kg (207 lb 7.3 oz)    Intake/Output: I/O last 3 completed shifts: In: 4217.1 [P.O.:540; I.V.:2577.1; IV Piggyback:1100] Out: 871 [Urine:871]   Intake/Output this shift:  Total I/O In: -  Out: 35 [Urine:35]  Physical Exam: General: Critically ill appearing   Head: Laceration on left side of forehead, bipap facemask on  Eyes: Anicteric  Neck: Supple, trachea midline  Lungs:  Bilateral rhonchi, more prominent on left, currently on bipap  Heart: S1S2 irregular  Abdomen:  Soft, nontender, bowel sounds present  Extremities: 1+ peripheral edema.  Neurologic: Awake, alert, will follow simple commands  Skin: No lesions       Basic Metabolic Panel:  Recent Labs Lab 05/08/16 1101 05/08/16 1800 05/09/16 0247 05/10/16 0352 05/11/16 0512  NA 138  --  140 137 132*  K 2.8* 4.0 4.7 4.0 4.5  CL 106  --  104 98* 93*  CO2 23  --  19* 27 25  GLUCOSE 119*  --  128* 187* 231*  BUN 24*  --  37* 61* 89*  CREATININE 1.80*  --  2.53* 2.59* 3.19*  CALCIUM 7.9*  --  7.9* 7.7* 8.0*  MG 1.3* 2.4  --   --  2.5*  PHOS  --  2.9  --   --   --     Liver Function  Tests:  Recent Labs Lab 05/08/16 1101  AST 43*  ALT 24  ALKPHOS 42  BILITOT 3.2*  PROT 5.9*  ALBUMIN 3.0*   No results for input(s): LIPASE, AMYLASE in the last 168 hours. No results for input(s): AMMONIA in the last 168 hours.  CBC:  Recent Labs Lab 05/07/16 2006 05/08/16 0174 05/09/16 0247 05/10/16 0352 05/11/16 0512  WBC 8.8 2.8* 6.6 10.2 12.8*  NEUTROABS 8.0* 2.4  --   --   --   HGB 13.9 13.5 13.9 11.9* 11.4*  HCT 41.4 40.1 41.8 34.3* 34.0*  MCV 88.1 89.5 90.0 89.6 88.5  PLT 111* 96* 105* 99* 103*    Cardiac Enzymes:  Recent Labs Lab 05/09/16 2158 05/10/16 0352 05/10/16 1236 05/11/16 0030 05/11/16 0827  TROPONINI 0.50* 0.45* 0.43* 0.55* 0.38*    BNP: Invalid input(s): POCBNP  CBG:  Recent Labs Lab 05/10/16 1204 05/10/16 1626 05/10/16 2153 05/11/16 0738 05/11/16 1124  GLUCAP 223* 172* 167* 219* 171*    Microbiology: Results for orders placed or performed during the hospital encounter of 05/08/16  Culture, blood (routine x 2)     Status: None (Preliminary result)   Collection Time: 05/08/16  9:53 AM  Result Value Ref Range Status  Specimen Description BLOOD L ARM  Final   Special Requests BOTTLES DRAWN AEROBIC AND ANAEROBIC BCAV  Final   Culture NO GROWTH 3 DAYS  Final   Report Status PENDING  Incomplete  Culture, blood (routine x 2)     Status: None (Preliminary result)   Collection Time: 05/08/16  9:53 AM  Result Value Ref Range Status   Specimen Description BLOOD R ARM  Final   Special Requests BOTTLES DRAWN AEROBIC AND ANAEROBIC BCLV  Final   Culture NO GROWTH 3 DAYS  Final   Report Status PENDING  Incomplete  MRSA PCR Screening     Status: None   Collection Time: 05/08/16  2:31 PM  Result Value Ref Range Status   MRSA by PCR NEGATIVE NEGATIVE Final    Comment:        The GeneXpert MRSA Assay (FDA approved for NASAL specimens only), is one component of a comprehensive MRSA colonization surveillance program. It is not intended  to diagnose MRSA infection nor to guide or monitor treatment for MRSA infections.   Urine culture     Status: None   Collection Time: 05/09/16 11:33 AM  Result Value Ref Range Status   Specimen Description URINE, RANDOM  Final   Special Requests NONE  Final   Culture   Final    NO GROWTH Performed at [facility], [address] [place], [place] [address]    Report Status 05/11/2016 FINAL  Final  Culture, expectorated sputum-assessment     Status: None   Collection Time: 05/10/16  3:53 AM  Result Value Ref Range Status   Specimen Description SPUTUM  Final   Special Requests NONE  Final   Sputum evaluation   Final    Sputum specimen not acceptable for testing.  Please recollect.   C/DALE HOPKINS AT 6222 05/10/16.PMH   Report Status 05/10/2016 FINAL  Final    Coagulation Studies:  Recent Labs  05/08/16 1322  LABPROT 18.6*  INR 1.54    Urinalysis: No results for input(s): COLORURINE, LABSPEC, PHURINE, GLUCOSEU, HGBUR, BILIRUBINUR, KETONESUR, PROTEINUR, UROBILINOGEN, NITRITE, LEUKOCYTESUR in the last 72 hours.  Invalid input(s): APPERANCEUR    Imaging: Dg Chest Port 1 View  Result Date: 05/11/2016 CLINICAL DATA:  Acute respiratory failure. EXAM: PORTABLE CHEST 1 VIEW COMPARISON:  05/10/2016, 05/08/2016 and chest CT 05/08/2016 FINDINGS: Tubing apparatus over the upper left chest of uncertain clinical significance. Lungs are adequately inflated and demonstrate persistent airspace process over the left midlung with slight worsening airspace density in the right base. Cardiomediastinal silhouette and remainder the exam is unchanged. IMPRESSION: Persistent bilateral airspace process left worse than right likely ongoing infection. Electronically Signed   By: Marin Olp M.D.   On: 05/11/2016 07:37   Dg Chest Port 1 View  Result Date: 05/10/2016 CLINICAL DATA:  Sepsis EXAM: PORTABLE CHEST 1 VIEW COMPARISON:  05/09/2016 FINDINGS: Shallow inspiration. Cardiac enlargement.  Persistent consolidation in the left middle lung consistent with pneumonia. Developing atelectasis or infiltration in the right lung base. No pneumothorax. No blunting of costophrenic angles. Calcification of the aorta. IMPRESSION: No change in left mid lung consolidation. Developing infiltration or atelectasis in the right lung base. Cardiac enlargement. Electronically Signed   By: Lucienne Capers M.D.   On: 05/10/2016 21:47     Medications:   . amiodarone 60 mg/hr (05/11/16 0902)  . heparin 1,300 Units/hr (05/11/16 0904)   . budesonide (PULMICORT) nebulizer solution  0.5 mg Nebulization BID  . carvedilol  12.5 mg Oral  BID WC  . chlorhexidine  15 mL Mouth Rinse BID  . doxycycline (VIBRAMYCIN) IV  100 mg Intravenous Q12H  . famotidine  20 mg Oral QHS  . guaiFENesin  600 mg Oral BID  . insulin aspart  0-5 Units Subcutaneous QHS  . insulin aspart  0-9 Units Subcutaneous TID WC  . ipratropium-albuterol  3 mL Nebulization Q6H  . mouth rinse  15 mL Mouth Rinse q12n4p  . methylPREDNISolone (SOLU-MEDROL) injection  40 mg Intravenous Q12H  . piperacillin-tazobactam (ZOSYN)  IV  3.375 g Intravenous Q8H  . sodium chloride flush  3 mL Intravenous Q12H  . tamsulosin  0.4 mg Oral Daily   acetaminophen **OR** acetaminophen, labetalol, morphine injection, ondansetron **OR** ondansetron (ZOFRAN) IV, promethazine, traZODone  Assessment/ Plan:  76 y.o. male with a PMHx of Atrial fibrillation, BPH, congestive heart failure, coronary artery disease, depression, diabetes mellitus type 2, hypertension, hyperlipidemia, history of malignant melanoma who was admitted to [facility] on 05/08/2016 for evaluation of fall.  Pt found to have extensive left sided pneumonia with sepsis, acute respiratory failure, acute renal failure.   1.  Acute renal failure due to ATN from sepsis. (baseline Cr 1.0) (N17.0) 2.  Acute respiratory failure due to left sided pneumonia. 3.  Metabolic/lactic acidosis, improved 4.  Atrial  fibrillation with RVR, HR currently under better control. 5.  Sepsis  Plan:  Patient seen at bedside. His respiratory status has worsened over the preceding 24 hours. Patient now back on BiPAP. He also had some periods of delirium last night most likely secondary to hypercarbia. Urine output is dropping BUN and creatinine are also higher. Patient also recently had atrial fibrillation with rapid ventricular response. Given the complexity of illness we will proceed with continuous renal replacement therapy. I've discussed this with Dr. Jefferson Fuel.  We will proceed with continuous renal placement therapy once access is in place. Overall patient continues to have a guarded prognosis.   LOS: 3 [place] Coco 3/18/201811:25 AM

## 2016-05-11 NOTE — Progress Notes (Signed)
CRRT Medication adjustment review:   . budesonide (PULMICORT) nebulizer solution  0.5 mg Nebulization BID  . carvedilol  12.5 mg Oral BID WC  . chlorhexidine  15 mL Mouth Rinse BID  . doxycycline (VIBRAMYCIN) IV  100 mg Intravenous Q12H  . famotidine  20 mg Oral QHS  . guaiFENesin  600 mg Oral BID  . insulin aspart  0-5 Units Subcutaneous QHS  . insulin aspart  0-9 Units Subcutaneous TID WC  . ipratropium-albuterol  3 mL Nebulization Q6H  . mouth rinse  15 mL Mouth Rinse q12n4p  . methylPREDNISolone (SOLU-MEDROL) injection  40 mg Intravenous Q12H  . piperacillin-tazobactam (ZOSYN)  IV  3.375 g Intravenous Q8H  . sodium chloride flush  3 mL Intravenous Q12H  . tamsulosin  0.4 mg Oral Daily   . amiodarone 60 mg/hr (05/11/16 0902)  . heparin 1,300 Units/hr (05/11/16 0904)  . pureflow       Current medications do not appear to require adjustment for CRRT at this time.  Chinita Greenland PharmD Clinical Pharmacist 05/11/2016

## 2016-05-11 NOTE — Progress Notes (Signed)
[place] for electrolyte management   Pharmacy consulted for electrolyte management for 76 yo male admitted with PNA/Afib.  Plan:  No replacement warranted at this time. Pharmacy will continue to monitor and adjust per consult.     Allergies  Allergen Reactions  . Ciprofloxacin     Other reaction(s): Other (See Comments) Pt states he could hardly move  . Sitagliptin     Other reaction(s): Other (See Comments) Weakness    Patient Measurements: Height: 5\' 9"  (175.3 cm) Weight: 207 lb 7.3 oz (94.1 kg) IBW/kg (Calculated) : 70.7   Vital Signs: Temp: 97.5 F (36.4 C) (03/18 0800) Temp Source: Axillary (03/18 0800) BP: 129/90 (03/18 0600) Pulse Rate: 88 (03/18 0600) Intake/Output from previous day: 03/17 0701 - 03/18 0700 In: 2298.9 [P.O.:540; I.V.:1158.9; IV Piggyback:600] Out: 496 [Urine:496] Intake/Output from this shift: Total I/O In: -  Out: 35 [Urine:35]  Labs:  Recent Labs  05/08/16 1322 05/08/16 1800 05/09/16 0247 05/10/16 0352 05/11/16 0512  WBC  --   --  6.6 10.2 12.8*  HGB  --   --  13.9 11.9* 11.4*  HCT  --   --  41.8 34.3* 34.0*  PLT  --   --  105* 99* 103*  APTT 51*  --   --   --   --   CREATININE  --   --  2.53* 2.59* 3.19*  MG  --  2.4  --   --  2.5*  PHOS  --  2.9  --   --   --    Estimated Creatinine Clearance: 22.7 mL/min (A) (by C-G formula based on SCr of 3.19 mg/dL (H)).  Lab Results  Component Value Date   K 4.5 05/11/2016     Medical History: Past Medical History:  Diagnosis Date  . Acute renal failure ([place])   . BPH (benign prostatic hyperplasia)   . Chronic atrial fibrillation ([place])   . Chronic diastolic CHF (congestive heart failure) ([name])    a. echo as above  . Chronic insomnia   . Coronary artery disease, non-occlusive 10/06/2013   a. cath 09/2013: pLAD 40, CTO Diag, ramus 40-->80, OM 50 mRCA 40, med rx  . Depression   . Diabetes mellitus with complication ([place])   . History  of diverticulitis   . Hyperkalemia   . Hyperlipidemia   . Hypertension   . Malignant melanoma ([place])   . Mitral regurgitation    a. echo 09/2013: EF 55-60%, mildly dilated left atrrium, mild to moderate MR/TR  . Sleep apnea in adult    Pharmacy will continue to monitor and adjust per consult.   Klye Besecker A 05/11/2016,11:17 AM

## 2016-05-11 NOTE — Progress Notes (Addendum)
Pulmonary/Critical Care  Procedure note Dialysis catheter placement Consent is obtained and on chart Procedural pause with two identifiers and discussed with family members Ultrasound guidance utilized Complete contact barrier precautions utilized Subdermal lidocaine injected after topical cleansing with Hibiclens First pass resulted in successful cannulation of the right femoral vein Dark nonpulsatile venous return noted Guidewire passed through the needle without resistance and needle removed Serial dilation performed Using the Seldinger technique dialysis catheter was placed taking care of maintaining control the guidewire the entire procedure After the catheter was inserted the guidewire was removed intact All 3 ports had dark nonpulsatile venous return and were flushed Sutured into place Dressed by nurse Patient tolerated procedure well with minimal blood loss  Hermelinda Dellen, D.O.

## 2016-05-11 NOTE — Progress Notes (Signed)
ANTICOAGULATION CONSULT NOTE - Follow Up Consult  Pharmacy Consult for Heparin drip  Indication: atrial fibrillation  Allergies  Allergen Reactions  . Ciprofloxacin     Other reaction(s): Other (See Comments) Pt states he could hardly move  . Sitagliptin     Other reaction(s): Other (See Comments) Weakness    Patient Measurements: Height: 5\' 9"  (175.3 cm) Weight: 207 lb 7.3 oz (94.1 kg) IBW/kg (Calculated) : 70.7 Heparin Dosing Weight: 90 kg  Vital Signs: Temp: 96.2 F (35.7 C) (03/18 1700) Temp Source: Axillary (03/18 1700) BP: 127/102 (03/18 1600) Pulse Rate: 71 (03/18 1600)  Labs:  Recent Labs  05/09/16 0247  05/10/16 0352  05/10/16 1951 05/11/16 0030 05/11/16 0512 05/11/16 0827 05/11/16 1327 05/11/16 1342 05/11/16 1741  HGB 13.9  --  11.9*  --   --   --  11.4*  --   --   --   --   HCT 41.8  --  34.3*  --   --   --  34.0*  --   --   --   --   PLT 105*  --  99*  --   --   --  103*  --   --   --   --   HEPARINUNFRC  --   < > 0.21*  < > 0.48  --  3.34*  --   --   --  0.26*  CREATININE 2.53*  --  2.59*  --   --   --  3.19*  --   --  2.86* 2.47*  TROPONINI  --   < > 0.45*  < >  --  0.55*  --  0.38* 0.42*  --   --   < > = values in this interval not displayed.  Estimated Creatinine Clearance: 29.3 mL/min (A) (by C-G formula based on SCr of 2.47 mg/dL (H)).   Assessment: 76 yo male with PMH of A. Fib. Patient has been taking Dabigatran BID at home until he runs out of current prescription- then plan was for him to start apixaban 5mg  BID. Patient dose not know when he took his last dose of Dabigatran, but guesses it was probably on 3/13 at some point.   Goal of Therapy: Heparin level 0.3-0.7 units/ml aPTT 66-102seconds Monitor platelets by anticoagulation protocol: Yes  Plan: Continue heparin infusion at current rate and recheck a HL in 8 hours.   3/17 HL @ 0400 0.21 subtherapeutic. Will bolus w/ 1400 units x 1 and increase drip rate to 1500 units/hour  and recheck HL @ 1200. Will continue to monitor CBC.  3/17 HL @ 1236= 0.26.  Will give bolus of 1300 units and increase Heparin to 1700 units/hr. Recheck HL at 1930.  3/17 1951 HL therapeutic x 1. Continue current rate. Will recheck HL in 8 hours.  3/18 1741 HL subtherapeutic x 1. Increase to 1400 units/hr. Will recheck HL in 8 hours  Laural Benes, Pharm.D., BCPS Clinical Pharmacist 05/11/2016,6:08 PM

## 2016-05-11 NOTE — Progress Notes (Signed)
Progress Note  Patient Name: Todd Valentine Date of Encounter: 05/11/2016  Primary Cardiologist: Rockey Situ  Subjective   Back on BIPAP since last night due to hypercapnea. Will see if he can tolerate PO. No CP.   Inpatient Medications    Scheduled Meds: . budesonide (PULMICORT) nebulizer solution  0.5 mg Nebulization BID  . carvedilol  6.25 mg Oral BID WC  . chlorhexidine  15 mL Mouth Rinse BID  . doxycycline (VIBRAMYCIN) IV  100 mg Intravenous Q12H  . famotidine  20 mg Oral QHS  . guaiFENesin  600 mg Oral BID  . insulin aspart  0-5 Units Subcutaneous QHS  . insulin aspart  0-9 Units Subcutaneous TID WC  . ipratropium-albuterol  3 mL Nebulization Q6H  . mouth rinse  15 mL Mouth Rinse q12n4p  . methylPREDNISolone (SOLU-MEDROL) injection  40 mg Intravenous Q12H  . piperacillin-tazobactam (ZOSYN)  IV  3.375 g Intravenous Q8H  . sodium chloride  1,000 mL Intravenous Q1H  . sodium chloride flush  3 mL Intravenous Q12H  . tamsulosin  0.4 mg Oral Daily   Continuous Infusions: . amiodarone 60 mg/hr (05/11/16 0902)  . diltiazem (CARDIZEM) infusion Stopped (05/10/16 0944)  . heparin 1,300 Units/hr (05/11/16 0904)   PRN Meds: acetaminophen **OR** acetaminophen, labetalol, morphine injection, ondansetron **OR** ondansetron (ZOFRAN) IV, promethazine, traZODone   Vital Signs    Vitals:   05/11/16 0530 05/11/16 0600 05/11/16 0738 05/11/16 0800  BP: (!) 133/108 129/90    Pulse: 84 88    Resp: (!) 22 (!) 22    Temp:    97.5 F (36.4 C)  TempSrc:    Axillary  SpO2: 97% 97% 96%   Weight:      Height:        Intake/Output Summary (Last 24 hours) at 05/11/16 0918 Last data filed at 05/11/16 0828  Gross per 24 hour  Intake          2298.92 ml  Output              531 ml  Net          1767.92 ml   Filed Weights   05/08/16 0906 05/08/16 1405  Weight: 210 lb (95.3 kg) 207 lb 7.3 oz (94.1 kg)    Telemetry    Atrial fibrillation, VR in 80s-100s - personally  reviewed  ECG      Physical Exam    PHYSICAL EXAM: VS:  BP 129/90   Pulse 88   Temp 97.5 F (36.4 C) (Axillary)   Resp (!) 22   Ht 5\' 9"  (1.753 m)   Wt 207 lb 7.3 oz (94.1 kg)   SpO2 96%   BMI 30.64 kg/m  , BMI Body mass index is 30.64 kg/m. GENERAL:  well developed, well nourished, obese, not in acute distress HEENT: normocephalic, pink conjunctivae, anicteric sclerae, no xanthelasma, normal dentition, oropharynx clear NECK:  no neck vein engorgement, JVP normal, no hepatojugular reflux, carotid upstroke brisk and symmetric, no bruit, no thyromegaly, no lymphadenopathy LUNGS:  good respiratory effort, clear to auscultation bilaterally anteriorly CV:  PMI not displaced, no thrills, no lifts, irregular, S2 within normal limits, no palpable S3 or S4, no murmurs, no rubs, no gallops ABD:  Soft, nontender, nondistended, normoactive bowel sounds, no abdominal aortic bruit, no hepatomegaly, no splenomegaly MS: nontender back, no kyphosis, no scoliosis, no joint deformities EXT:  1+ DP/PT pulses, no edema, no varicosities, no cyanosis, no clubbing SKIN: nondiaphoretic, normal turgor, no ulcers NEUROPSYCH:  alert, oriented to person, place, and time, sensory/motor grossly intact, normal mood, appropriate affect     Labs    Chemistry  Recent Labs Lab 05/08/16 1101  05/09/16 0247 05/10/16 0352 05/11/16 0512  NA 138  --  140 137 132*  K 2.8*  < > 4.7 4.0 4.5  CL 106  --  104 98* 93*  CO2 23  --  19* 27 25  GLUCOSE 119*  --  128* 187* 231*  BUN 24*  --  37* 61* 89*  CREATININE 1.80*  --  2.53* 2.59* 3.19*  CALCIUM 7.9*  --  7.9* 7.7* 8.0*  PROT 5.9*  --   --   --   --   ALBUMIN 3.0*  --   --   --   --   AST 43*  --   --   --   --   ALT 24  --   --   --   --   ALKPHOS 42  --   --   --   --   BILITOT 3.2*  --   --   --   --   GFRNONAA 35*  --  23* 23* 18*  GFRAA 41*  --  27* 26* 20*  ANIONGAP 9  --  17* 12 14  < > = values in this interval not displayed.    Hematology  Recent Labs Lab 05/09/16 0247 05/10/16 0352 05/11/16 0512  WBC 6.6 10.2 12.8*  RBC 4.65 3.83* 3.84*  HGB 13.9 11.9* 11.4*  HCT 41.8 34.3* 34.0*  MCV 90.0 89.6 88.5  MCH 30.0 31.1 29.7  MCHC 33.3 34.7 33.6  RDW 14.0 13.9 14.3  PLT 105* 99* 103*    Cardiac Enzymes  Recent Labs Lab 05/09/16 2158 05/10/16 0352 05/10/16 1236 05/11/16 0030  TROPONINI 0.50* 0.45* 0.43* 0.55*   No results for input(s): TROPIPOC in the last 168 hours.   BNP  Recent Labs Lab 05/11/16 0512  BNP 459.0*     DDimer No results for input(s): DDIMER in the last 168 hours.   Radiology    Dg Chest Port 1 View  Result Date: 05/11/2016 CLINICAL DATA:  Acute respiratory failure. EXAM: PORTABLE CHEST 1 VIEW COMPARISON:  05/10/2016, 05/08/2016 and chest CT 05/08/2016 FINDINGS: Tubing apparatus over the upper left chest of uncertain clinical significance. Lungs are adequately inflated and demonstrate persistent airspace process over the left midlung with slight worsening airspace density in the right base. Cardiomediastinal silhouette and remainder the exam is unchanged. IMPRESSION: Persistent bilateral airspace process left worse than right likely ongoing infection. Electronically Signed   By: Marin Olp M.D.   On: 05/11/2016 07:37   Dg Chest Port 1 View  Result Date: 05/10/2016 CLINICAL DATA:  Sepsis EXAM: PORTABLE CHEST 1 VIEW COMPARISON:  05/09/2016 FINDINGS: Shallow inspiration. Cardiac enlargement. Persistent consolidation in the left middle lung consistent with pneumonia. Developing atelectasis or infiltration in the right lung base. No pneumothorax. No blunting of costophrenic angles. Calcification of the aorta. IMPRESSION: No change in left mid lung consolidation. Developing infiltration or atelectasis in the right lung base. Cardiac enlargement. Electronically Signed   By: Lucienne Capers M.D.   On: 05/10/2016 21:47   Dg Chest Port 1 View  Result Date: 05/09/2016 CLINICAL DATA:   Pneumonia, respiratory failure, atrial fibrillation and congestive heart failure. EXAM: PORTABLE CHEST 1 VIEW COMPARISON:  Chest x-ray and CT studies on 05/08/2016 FINDINGS: Airspace consolidation within the left upper lobe and lower lobe is partially  masked by an overlying temporary pacing pad. Overall distribution likely similar to the chest x-ray yesterday. No overt edema or pleural effusion identified. Stable cardiac enlargement. No pneumothorax. IMPRESSION: Stable overall distribution of left lung airspace consolidation. Appearance by chest x-ray is today partially masked by an overlying temporary pacing pad. Electronically Signed   By: Aletta Edouard M.D.   On: 05/09/2016 09:51    Cardiac Studies   Echo 05/08/2016 reviewed personally: EF 50-55% Mild MR LA mildly dilated RV sys function mildly reduced  Patient Profile     76 year old gentleman with chronic atrial fibrillation, last seen April 25 2016 In clinic at which time he was doing well, prior history chronic diastolic CHF, type 2 diabetes, three-vessel coronary disease by catheterization August 2015, presenting with chills, fever, malaise, shortness of breath consistent with pneumonia, atrial fibrillation with RVR heart rate up to 170 bpm  Assessment & Plan    ----Acute respiratory failure PNA, sepsis Back on BIPAP Treatment per primary team   ----Atrial fibrillation with RVR, hx of chronic afib Improving ventricular rates Off of Cardizem gtt. On PO Coreg, Amio gtt, Heparin gtt If needed, may uptitrate Carvedilol to also help with BP control As per 05/09/2016: Echocardiogram showing low normal ejection fraction Pradaxa held on March 13 by the patient when he did not feel well   -----Acute renal failure ATN from sepsis Crea continuing to rise Nephrology following   ---Elevated troponin Hx of CAD on cath 2015 No angina From demand ischemia in settign of sepsis, tachycardia, also renal failure Cont treatment of  underlying medical issues Pt already on Heparin gtt EF 50-55% Consider resuming statin therapy (pt was on simvastatin 40mg  po qhs)   ---HTN BP wnl now. As mentioned above, may uptitrate Coreg  I spent at least 35 minutes with the patient today and more than 50% of the time was spent counseling the patient and coordinating care.   Signed, Wende Bushy, MD  05/11/2016, 9:18 AM

## 2016-05-11 NOTE — Progress Notes (Signed)
Discussed with Dr. Jefferson Fuel of ICU team. He will be taking over.Burnis Medin transfer to his service.

## 2016-05-12 DIAGNOSIS — J189 Pneumonia, unspecified organism: Secondary | ICD-10-CM

## 2016-05-12 DIAGNOSIS — I4891 Unspecified atrial fibrillation: Secondary | ICD-10-CM

## 2016-05-12 DIAGNOSIS — N179 Acute kidney failure, unspecified: Secondary | ICD-10-CM

## 2016-05-12 LAB — CBC
HCT: 35.1 % — ABNORMAL LOW (ref 40.0–52.0)
HCT: 35.7 % — ABNORMAL LOW (ref 40.0–52.0)
Hemoglobin: 11.8 g/dL — ABNORMAL LOW (ref 13.0–18.0)
Hemoglobin: 12 g/dL — ABNORMAL LOW (ref 13.0–18.0)
MCH: 29.7 pg (ref 26.0–34.0)
MCH: 29.7 pg (ref 26.0–34.0)
MCHC: 33.7 g/dL (ref 32.0–36.0)
MCHC: 33.6 g/dL (ref 32.0–36.0)
MCV: 88.2 fL (ref 80.0–100.0)
MCV: 88.5 fL (ref 80.0–100.0)
Platelets: 106 10*3/uL — ABNORMAL LOW (ref 150–440)
Platelets: 104 10*3/uL — ABNORMAL LOW (ref 150–440)
RBC: 3.98 MIL/uL — ABNORMAL LOW (ref 4.40–5.90)
RBC: 4.03 MIL/uL — ABNORMAL LOW (ref 4.40–5.90)
RDW: 14.9 % — ABNORMAL HIGH (ref 11.5–14.5)
RDW: 14.7 % — ABNORMAL HIGH (ref 11.5–14.5)
WBC: 16 10*3/uL — ABNORMAL HIGH (ref 3.8–10.6)
WBC: 17.1 10*3/uL — ABNORMAL HIGH (ref 3.8–10.6)

## 2016-05-12 LAB — RENAL FUNCTION PANEL
Albumin: 2.7 g/dL — ABNORMAL LOW (ref 3.5–5.0)
Albumin: 2.6 g/dL — ABNORMAL LOW (ref 3.5–5.0)
Albumin: 2.5 g/dL — ABNORMAL LOW (ref 3.5–5.0)
Anion gap: 7 (ref 5–15)
Anion gap: 8 (ref 5–15)
Anion gap: 8 (ref 5–15)
BUN: 61 mg/dL — ABNORMAL HIGH (ref 6–20)
BUN: 54 mg/dL — ABNORMAL HIGH (ref 6–20)
BUN: 55 mg/dL — ABNORMAL HIGH (ref 6–20)
CO2: 26 mmol/L (ref 22–32)
CO2: 25 mmol/L (ref 22–32)
CO2: 25 mmol/L (ref 22–32)
Calcium: 8.1 mg/dL — ABNORMAL LOW (ref 8.9–10.3)
Calcium: 8.2 mg/dL — ABNORMAL LOW (ref 8.9–10.3)
Calcium: 8.2 mg/dL — ABNORMAL LOW (ref 8.9–10.3)
Chloride: 99 mmol/L — ABNORMAL LOW (ref 101–111)
Chloride: 99 mmol/L — ABNORMAL LOW (ref 101–111)
Chloride: 101 mmol/L (ref 101–111)
Creatinine, Ser: 2.02 mg/dL — ABNORMAL HIGH (ref 0.61–1.24)
Creatinine, Ser: 1.84 mg/dL — ABNORMAL HIGH (ref 0.61–1.24)
Creatinine, Ser: 1.92 mg/dL — ABNORMAL HIGH (ref 0.61–1.24)
GFR calc Af Amer: 35 mL/min — ABNORMAL LOW (ref >60)
GFR calc Af Amer: 40 mL/min — ABNORMAL LOW (ref >60)
GFR calc Af Amer: 38 mL/min — ABNORMAL LOW (ref >60)
GFR calc non Af Amer: 31 mL/min — ABNORMAL LOW (ref >60)
GFR calc non Af Amer: 34 mL/min — ABNORMAL LOW (ref >60)
GFR calc non Af Amer: 32 mL/min — ABNORMAL LOW (ref >60)
Glucose, Bld: 143 mg/dL — ABNORMAL HIGH (ref 65–99)
Glucose, Bld: 126 mg/dL — ABNORMAL HIGH (ref 65–99)
Glucose, Bld: 126 mg/dL — ABNORMAL HIGH (ref 65–99)
Phosphorus: 3.8 mg/dL (ref 2.5–4.6)
Phosphorus: 3.6 mg/dL (ref 2.5–4.6)
Phosphorus: 3.9 mg/dL (ref 2.5–4.6)
Potassium: 4.3 mmol/L (ref 3.5–5.1)
Potassium: 4.3 mmol/L (ref 3.5–5.1)
Potassium: 4.1 mmol/L (ref 3.5–5.1)
Sodium: 132 mmol/L — ABNORMAL LOW (ref 135–145)
Sodium: 132 mmol/L — ABNORMAL LOW (ref 135–145)
Sodium: 134 mmol/L — ABNORMAL LOW (ref 135–145)

## 2016-05-12 LAB — GLUCOSE, CAPILLARY
Glucose-Capillary: 104 mg/dL — ABNORMAL HIGH (ref 65–99)
Glucose-Capillary: 118 mg/dL — ABNORMAL HIGH (ref 65–99)
Glucose-Capillary: 155 mg/dL — ABNORMAL HIGH (ref 65–99)
Glucose-Capillary: 127 mg/dL — ABNORMAL HIGH (ref 65–99)

## 2016-05-12 LAB — HEPARIN LEVEL (UNFRACTIONATED)
Heparin Unfractionated: 0.42 IU/mL (ref 0.30–0.70)
Heparin Unfractionated: 0.32 IU/mL (ref 0.30–0.70)
Heparin Unfractionated: 0.5 IU/mL (ref 0.30–0.70)

## 2016-05-12 LAB — MAGNESIUM: Magnesium: 2 mg/dL (ref 1.7–2.4)

## 2016-05-12 LAB — PHOSPHORUS: Phosphorus: 3.5 mg/dL (ref 2.5–4.6)

## 2016-05-12 LAB — LEGIONELLA PNEUMOPHILA SEROGP 1 UR AG: L. pneumophila Serogp 1 Ur Ag: NEGATIVE

## 2016-05-12 LAB — PROCALCITONIN: Procalcitonin: 39.5 ng/mL

## 2016-05-12 MED ORDER — DEXTROSE 5 % IV SOLN
2.0000 g | Freq: Every day | INTRAVENOUS | Status: DC
  Administered 2016-05-12 – 2016-05-17 (×6): 2 g via INTRAVENOUS
  Filled 2016-05-12 (×8): qty 2

## 2016-05-12 MED ORDER — ATORVASTATIN CALCIUM 20 MG PO TABS
40.0000 mg | ORAL_TABLET | Freq: Every day | ORAL | Status: DC
  Administered 2016-05-12 – 2016-05-18 (×7): 40 mg via ORAL
  Filled 2016-05-12 (×7): qty 2

## 2016-05-12 MED ORDER — CARVEDILOL 12.5 MG PO TABS
12.5000 mg | ORAL_TABLET | Freq: Once | ORAL | Status: AC
  Administered 2016-05-12: 12.5 mg via ORAL
  Filled 2016-05-12: qty 1

## 2016-05-12 MED ORDER — BLISTEX MEDICATED EX OINT
TOPICAL_OINTMENT | CUTANEOUS | Status: DC | PRN
  Administered 2016-05-13: 1 via TOPICAL
  Filled 2016-05-12: qty 6.3

## 2016-05-12 MED ORDER — DILTIAZEM HCL 30 MG PO TABS
30.0000 mg | ORAL_TABLET | Freq: Three times a day (TID) | ORAL | Status: DC | PRN
  Administered 2016-05-15: 30 mg via ORAL
  Filled 2016-05-12: qty 1

## 2016-05-12 MED ORDER — CARVEDILOL 25 MG PO TABS
25.0000 mg | ORAL_TABLET | Freq: Two times a day (BID) | ORAL | Status: DC
  Administered 2016-05-12 – 2016-05-19 (×14): 25 mg via ORAL
  Filled 2016-05-12 (×14): qty 1

## 2016-05-12 NOTE — Progress Notes (Signed)
[facility]  ROUNDING NOTE   Subjective:  Taken off CRRT this morning.   UOP 360 UF 812 Off BIPAP this morning. Step daughter at bedside.   Objective:  Vital signs in last 24 hours:  Temp:  [96.2 F (35.7 C)-98 F (36.7 C)] 96.2 F (35.7 C) (03/19 1206) Pulse Rate:  [71-94] 84 (03/19 1100) Resp:  [13-28] 22 (03/19 1100) BP: (117-165)/(79-134) 155/104 (03/19 1000) SpO2:  [82 %-100 %] 94 % (03/19 1000) FiO2 (%):  [40 %-56 %] 40 % (03/19 0840) Weight:  [103 kg (227 lb 1.2 oz)] 103 kg (227 lb 1.2 oz) (03/19 0500)  Weight change:  Filed Weights   05/08/16 0906 05/08/16 1405 05/12/16 0500  Weight: 95.3 kg (210 lb) 94.1 kg (207 lb 7.3 oz) 103 kg (227 lb 1.2 oz)    Intake/Output: I/O last 3 completed shifts: In: 2814.9 [P.O.:180; I.V.:1684.9; IV Piggyback:950] Out: 1342 [Urine:481; Other:861]   Intake/Output this shift:  Total I/O In: 380.3 [P.O.:60; I.V.:70.3; IV Piggyback:250] Out: 165 [Urine:20; Other:145]  Physical Exam: General: Critically ill appearing   Head: Laceration on left side of forehead  Eyes: Anicteric  Neck: Supple, trachea midline  Lungs:  Bilateral rhonchi   Heart: S1S2    Abdomen:  Soft, nontender, bowel sounds present  Extremities: 1+ peripheral edema.  Neurologic: Awake, alert, will follow simple commands  Skin: No lesions  Access: Right femoral vascath    Basic Metabolic Panel:  Recent Labs Lab 05/08/16 1101 05/08/16 1800  05/11/16 0512  05/11/16 1741 05/11/16 2148 05/12/16 0148 05/12/16 0539 05/12/16 1004  NA 138  --   < > 132*  < > 132* 134* 132* 132* 134*  K 2.8* 4.0  < > 4.5  < > 4.3 4.4 4.3 4.3 4.1  CL 106  --   < > 93*  < > 99* 100* 99* 99* 101  CO2 23  --   < > 25  < > 26 26 26 25 25   GLUCOSE 119*  --   < > 231*  < > 149* 118* 143* 126* 126*  BUN 24*  --   < > 89*  < > 74* 67* 61* 54* 55*  CREATININE 1.80*  --   < > 3.19*  < > 2.47* 2.24* 2.02* 1.84* 1.92*  CALCIUM 7.9*  --   < > 8.0*  < > 8.1* 8.2* 8.1* 8.2*  8.2*  MG 1.3* 2.4  --  2.5*  --   --   --   --  2.0  --   PHOS  --  2.9  --   --   < > 5.0* 4.2 3.8 3.6  3.5 3.9  < > = values in this interval not displayed.  Liver Function Tests:  Recent Labs Lab 05/08/16 1101  05/11/16 1741 05/11/16 2148 05/12/16 0148 05/12/16 0539 05/12/16 1004  AST 43*  --   --   --   --   --   --   ALT 24  --   --   --   --   --   --   ALKPHOS 42  --   --   --   --   --   --   BILITOT 3.2*  --   --   --   --   --   --   PROT 5.9*  --   --   --   --   --   --   ALBUMIN 3.0*  < >  2.5* 2.7* 2.7* 2.6* 2.5*  < > = values in this interval not displayed. No results for input(s): LIPASE, AMYLASE in the last 168 hours. No results for input(s): AMMONIA in the last 168 hours.  CBC:  Recent Labs Lab 05/07/16 2006 05/08/16 4166 05/09/16 0247 05/10/16 0352 05/11/16 0512 05/12/16 0539 05/12/16 1004  WBC 8.8 2.8* 6.6 10.2 12.8* 16.0* 17.1*  NEUTROABS 8.0* 2.4  --   --   --   --   --   HGB 13.9 13.5 13.9 11.9* 11.4* 11.8* 12.0*  HCT 41.4 40.1 41.8 34.3* 34.0* 35.1* 35.7*  MCV 88.1 89.5 90.0 89.6 88.5 88.2 88.5  PLT 111* 96* 105* 99* 103* 106* 104*    Cardiac Enzymes:  Recent Labs Lab 05/10/16 0352 05/10/16 1236 05/11/16 0030 05/11/16 0827 05/11/16 1327  TROPONINI 0.45* 0.43* 0.55* 0.38* 0.42*    BNP: Invalid input(s): POCBNP  CBG:  Recent Labs Lab 05/11/16 1336 05/11/16 1640 05/11/16 2108 05/12/16 0737 05/12/16 1141  GLUCAP 180* 149* 116* 104* 118*    Microbiology: Results for orders placed or performed during the hospital encounter of 05/08/16  Culture, blood (routine x 2)     Status: None (Preliminary result)   Collection Time: 05/08/16  9:53 AM  Result Value Ref Range Status   Specimen Description BLOOD L ARM  Final   Special Requests BOTTLES DRAWN AEROBIC AND ANAEROBIC BCAV  Final   Culture NO GROWTH 4 DAYS  Final   Report Status PENDING  Incomplete  Culture, blood (routine x 2)     Status: None (Preliminary result)    Collection Time: 05/08/16  9:53 AM  Result Value Ref Range Status   Specimen Description BLOOD R ARM  Final   Special Requests BOTTLES DRAWN AEROBIC AND ANAEROBIC BCLV  Final   Culture NO GROWTH 4 DAYS  Final   Report Status PENDING  Incomplete  MRSA PCR Screening     Status: None   Collection Time: 05/08/16  2:31 PM  Result Value Ref Range Status   MRSA by PCR NEGATIVE NEGATIVE Final    Comment:        The GeneXpert MRSA Assay (FDA approved for NASAL specimens only), is one component of a comprehensive MRSA colonization surveillance program. It is not intended to diagnose MRSA infection nor to guide or monitor treatment for MRSA infections.   Urine culture     Status: None   Collection Time: 05/09/16 11:33 AM  Result Value Ref Range Status   Specimen Description URINE, RANDOM  Final   Special Requests NONE  Final   Culture   Final    NO GROWTH Performed at [facility], [address] [place], [place] [address]    Report Status 05/11/2016 FINAL  Final  Culture, expectorated sputum-assessment     Status: None   Collection Time: 05/10/16  3:53 AM  Result Value Ref Range Status   Specimen Description SPUTUM  Final   Special Requests NONE  Final   Sputum evaluation   Final    Sputum specimen not acceptable for testing.  Please recollect.   C/DALE HOPKINS AT 6010 05/10/16.PMH   Report Status 05/10/2016 FINAL  Final    Coagulation Studies: No results for input(s): LABPROT, INR in the last 72 hours.  Urinalysis: No results for input(s): COLORURINE, LABSPEC, PHURINE, GLUCOSEU, HGBUR, BILIRUBINUR, KETONESUR, PROTEINUR, UROBILINOGEN, NITRITE, LEUKOCYTESUR in the last 72 hours.  Invalid input(s): APPERANCEUR    Imaging: Dg Chest Port 1 View  Result Date:  05/11/2016 CLINICAL DATA:  Acute respiratory failure. EXAM: PORTABLE CHEST 1 VIEW COMPARISON:  05/10/2016, 05/08/2016 and chest CT 05/08/2016 FINDINGS: Tubing apparatus over the upper left chest of uncertain clinical  significance. Lungs are adequately inflated and demonstrate persistent airspace process over the left midlung with slight worsening airspace density in the right base. Cardiomediastinal silhouette and remainder the exam is unchanged. IMPRESSION: Persistent bilateral airspace process left worse than right likely ongoing infection. Electronically Signed   By: Marin Olp M.D.   On: 05/11/2016 07:37   Dg Chest Port 1 View  Result Date: 05/10/2016 CLINICAL DATA:  Sepsis EXAM: PORTABLE CHEST 1 VIEW COMPARISON:  05/09/2016 FINDINGS: Shallow inspiration. Cardiac enlargement. Persistent consolidation in the left middle lung consistent with pneumonia. Developing atelectasis or infiltration in the right lung base. No pneumothorax. No blunting of costophrenic angles. Calcification of the aorta. IMPRESSION: No change in left mid lung consolidation. Developing infiltration or atelectasis in the right lung base. Cardiac enlargement. Electronically Signed   By: Lucienne Capers M.D.   On: 05/10/2016 21:47     Medications:   . heparin 1,500 Units/hr (05/12/16 1404)   . atorvastatin  40 mg Oral q1800  . budesonide (PULMICORT) nebulizer solution  0.5 mg Nebulization BID  . carvedilol  25 mg Oral BID WC  . cefTRIAXone (ROCEPHIN)  IV  2 g Intravenous Daily  . chlorhexidine  15 mL Mouth Rinse BID  . doxycycline (VIBRAMYCIN) IV  100 mg Intravenous Q12H  . famotidine  20 mg Oral QHS  . guaiFENesin  600 mg Oral BID  . insulin aspart  0-5 Units Subcutaneous QHS  . insulin aspart  0-9 Units Subcutaneous TID WC  . ipratropium-albuterol  3 mL Nebulization Q6H  . mouth rinse  15 mL Mouth Rinse q12n4p  . sodium chloride flush  3 mL Intravenous Q12H  . tamsulosin  0.4 mg Oral Daily   acetaminophen **OR** acetaminophen, diltiazem, labetalol, morphine injection, ondansetron **OR** ondansetron (ZOFRAN) IV, promethazine, traZODone  Assessment/ Plan:  76 y.o.white male with Atrial fibrillation, BPH, congestive heart  failure, coronary artery disease, depression, diabetes mellitus type 2, hypertension, hyperlipidemia, history of malignant melanoma who was admitted to [facility] on 05/08/2016 for evaluation of fall.  Pt found to have extensive left sided pneumonia with sepsis, acute respiratory failure, acute renal failure.   1.  Acute renal failure with metabolic acidosis due to ATN from sepsis. (baseline Cr 1.0) (N17.0) - Requiring renal replacement therapy. Oliguric renal failure.  - Monitor daily for intermittent hemodialysis.   2.  Acute respiratory failure due to left sided pneumonia: Noninvasive ventilation  3.  Sepsis: cultures negative. Off vasopressors. Empiric doxycycline and zosyn.    LOS: [place], [place] 3/19/20182:26 PM

## 2016-05-12 NOTE — Progress Notes (Signed)
   05/12/16 1206  Hot/Cold Interventions  Hot/Cold Interventions Warming blanket  Warming Blanket Applied

## 2016-05-12 NOTE — Progress Notes (Signed)
[facility] Progess Note    ASSESSMENT/PLAN   Todd Valentine  is a 76 y.o. male with a known history of Chronic atrial fibrillation, history of congestive heart failure, history of coronary artery disease, diabetes, hypertension, hyperlipidemia, history of melanoma who presents to the hospital due to weakness and a fall. Patient was found to have left-sided pneumonia with severe septic shock, acute renal failure, placed on CRRT.  PULMONARY A: Acute hypoxic respiratory failure secondary to pneumonia. Objective sleep apnea, not currently using CPAP at home. P:   -Continue Zosyn, doxycycline. -Wean down BiPAP. -Pap at night.   CARDIOVASCULAR A: Mildly elevated troponin. Atrial fibrillation, currently rate controlled, currently on amiodarone infusion. Currently on heparin infusion Essential hypertension. Echo 05/08/16: EF 55%.  P:  Will consider transition to Eliquis once off of the CRRT Will dc amiodarone IV.  Continue Coreg.  RENAL A:  Acute renal failure secondary to septic shock, currently on CRRT. P:   -We will discuss with nephrology in regards to a discontinuation of CRRT  GASTROINTESTINAL A:  --  HEMATOLOGIC A: -- P:    INFECTIOUS A:  -- P:    Micro/culture results:  MRSA screen. 3/15: Negative. BCx2 3/15: Negative 2. UC 3/17: Negative Sputum --  Antibiotics: Zosyn 3/18>> Doxycycline 3/16>>  ENDOCRINE A: Continue sliding scale insulin.  NEUROLOGIC A:  --   MAJOR EVENTS/TEST RESULTS:   Best Practices  DVT Prophylaxis: On IV heparin. GI Prophylaxis: --   ---------------------------------------   ----------------------------------------   Name: Todd Valentine MRN: [id number] DOB: [birth year]    ADMISSION DATE:  05/08/2016     SUBJECTIVE:   Pt currently on the BiPAP can not provide history or review of systems.   Review of Systems:  --   VITAL SIGNS: Temp:  [96.2 F (35.7 C)-98 F (36.7 C)]  97.7 F (36.5 C) (03/19 0600) Pulse Rate:  [71-94] 94 (03/19 0700) Resp:  [16-28] 28 (03/19 0700) BP: (117-165)/(79-124) 165/84 (03/19 0700) SpO2:  [85 %-100 %] 98 % (03/19 0700) FiO2 (%):  [40 %-56 %] 40 % (03/19 0400) Weight:  [227 lb 1.2 oz (103 kg)] 227 lb 1.2 oz (103 kg) (03/19 0500) HEMODYNAMICS:   VENTILATOR SETTINGS: FiO2 (%):  [40 %-56 %] 40 % INTAKE / OUTPUT:  Intake/Output Summary (Last 24 hours) at 05/12/16 0809 Last data filed at 05/12/16 0700  Gross per 24 hour  Intake          1898.28 ml  Output             1172 ml  Net           726.28 ml    PHYSICAL EXAMINATION: Physical Examination:   VS: BP (!) 165/84   Pulse 94   Temp 97.7 F (36.5 C) (Axillary)   Resp (!) 28   Ht 5\' 9"  (1.753 m)   Wt 227 lb 1.2 oz (103 kg)   SpO2 98%   BMI 33.53 kg/m   General Appearance: No distress  Neuro:without focal findings, mental status , reduced HEENT: PERRLA, EOM intact. Pulmonary: Diminished bilateral breath sounds   CardiovascularNormal S1,S2.  No m/r/g.   Abdomen: Benign, Soft, non-tender. Renal:  No costovertebral tenderness  GU:  Not performed at this time. Endocrine: No evident thyromegaly. Skin:   warm, no rashes, no ecchymosis  Extremities: normal, no cyanosis, clubbing.   LABS:   LABORATORY PANEL:   CBC  Recent Labs Lab 05/12/16 0539  WBC 16.0*  HGB 11.8*  HCT  35.1*  PLT 106*    Chemistries   Recent Labs Lab 05/08/16 1101  05/12/16 0539  NA 138  < > 132*  K 2.8*  < > 4.3  CL 106  < > 99*  CO2 23  < > 25  GLUCOSE 119*  < > 126*  BUN 24*  < > 54*  CREATININE 1.80*  < > 1.84*  CALCIUM 7.9*  < > 8.2*  MG 1.3*  < > 2.0  PHOS  --   < > 3.6  3.5  AST 43*  --   --   ALT 24  --   --   ALKPHOS 42  --   --   BILITOT 3.2*  --   --   < > = values in this interval not displayed.   Recent Labs Lab 05/11/16 0738 05/11/16 1124 05/11/16 1336 05/11/16 1640 05/11/16 2108 05/12/16 0737  GLUCAP 219* 171* 180* 149* 116* 104*    Recent  Labs Lab 05/10/16 2125 05/11/16 0000 05/11/16 0530  PHART 7.24* 7.13* 7.25*  PCO2ART 63* 84* 58*  PO2ART 65* 72* 106    Recent Labs Lab 05/08/16 1101  05/11/16 2148 05/12/16 0148 05/12/16 0539  AST 43*  --   --   --   --   ALT 24  --   --   --   --   ALKPHOS 42  --   --   --   --   BILITOT 3.2*  --   --   --   --   ALBUMIN 3.0*  < > 2.7* 2.7* 2.6*  < > = values in this interval not displayed.  Cardiac Enzymes  Recent Labs Lab 05/11/16 1327  TROPONINI 0.42*    RADIOLOGY:  Dg Chest Port 1 View  Result Date: 05/11/2016 CLINICAL DATA:  Acute respiratory failure. EXAM: PORTABLE CHEST 1 VIEW COMPARISON:  05/10/2016, 05/08/2016 and chest CT 05/08/2016 FINDINGS: Tubing apparatus over the upper left chest of uncertain clinical significance. Lungs are adequately inflated and demonstrate persistent airspace process over the left midlung with slight worsening airspace density in the right base. Cardiomediastinal silhouette and remainder the exam is unchanged. IMPRESSION: Persistent bilateral airspace process left worse than right likely ongoing infection. Electronically Signed   By: Marin Olp M.D.   On: 05/11/2016 07:37   Dg Chest Port 1 View  Result Date: 05/10/2016 CLINICAL DATA:  Sepsis EXAM: PORTABLE CHEST 1 VIEW COMPARISON:  05/09/2016 FINDINGS: Shallow inspiration. Cardiac enlargement. Persistent consolidation in the left middle lung consistent with pneumonia. Developing atelectasis or infiltration in the right lung base. No pneumothorax. No blunting of costophrenic angles. Calcification of the aorta. IMPRESSION: No change in left mid lung consolidation. Developing infiltration or atelectasis in the right lung base. Cardiac enlargement. Electronically Signed   By: Lucienne Capers M.D.   On: 05/10/2016 21:47       --Marda Stalker, MD.  ICU Pager: [phone] [facility] Office Number: [phone]   05/12/2016    Critical Care  Attestation.  I have personally obtained a history, examined the patient, evaluated laboratory and imaging results, formulated the assessment and plan and placed orders. The Patient requires high complexity decision making for assessment and support, frequent evaluation and titration of therapies, application of advanced monitoring technologies and extensive interpretation of multiple databases. The patient has critical illness that could lead imminently to failure of 1 or more organ systems and requires the highest level of physician preparedness to intervene.  Critical  Care Time devoted to patient care services described in this note is 45 minutes and is exclusive of time spent in procedures.

## 2016-05-12 NOTE — Progress Notes (Signed)
ANTICOAGULATION CONSULT NOTE - Follow Up Consult  Pharmacy Consult for Heparin drip             Indication: atrial fibrillation       Allergies  Allergen Reactions  . Ciprofloxacin     Other reaction(s): Other (See Comments) Pt states he could hardly move  . Sitagliptin     Other reaction(s): Other (See Comments) Weakness    Patient Measurements: Height: 5\' 9"  (175.3 cm) Weight: 207 lb 7.3 oz (94.1 kg) IBW/kg (Calculated) : 70.7 Heparin Dosing Weight: 90 kg  Vital Signs: Temp: 97.9 F (36.6 C) (03/17 2013) Temp Source: Oral (03/17 2013) BP: 150/111 (03/17 1700) Pulse Rate: 107 (03/17 1700)  Labs:  HL= 0.32 (0.42)  CBC Latest Ref Rng & Units 05/12/2016 05/12/2016 05/11/2016  WBC 3.8 - 10.6 K/uL 17.1(H) 16.0(H) 12.8(H)  Hemoglobin 13.0 - 18.0 g/dL 12.0(L) 11.8(L) 11.4(L)  Hematocrit 40.0 - 52.0 % 35.7(L) 35.1(L) 34.0(L)  Platelets 150 - 440 K/uL 104(L) 106(L) 103(L)     Assessment: 76 yo male with PMH of A. Fib. Patient has been taking Dabigatran BID at home until he runs out of current prescription- then plan was for him to start apixaban 5mg  BID. Patient dose not know when he took his last dose of Dabigatran, but guesses it was probably on 3/13 at some point.   Goal of Therapy: Heparin level 0.3-0.7 units/ml aPTT 66-102seconds Monitor platelets by anticoagulation protocol: Yes  Plan: Heparin level at goal but at low end of goal and decreasing. Will increase heparin drip to 1500 unit/hr and recheck a HL in 8 hours.   Ulice Dash, PharmD Clinical Pharmacist  05/12/2016

## 2016-05-12 NOTE — Progress Notes (Signed)
Pharmacy Antibiotic Note  Todd Valentine is a 76 y.o. male admitted on 05/08/2016 with pneumonia.  Pharmacy has been consulted for zosyn dosing.  Patient also on Doxycyline  Plan: After discussion with Dr. Ashby Dawes, no need to cover for aspiration. Will change Zosyn to ceftriaxone.    Height: 5\' 9"  (175.3 cm) Weight: 227 lb 1.2 oz (103 kg) IBW/kg (Calculated) : 70.7  Temp (24hrs), Avg:97.2 F (36.2 C), Min:96.2 F (35.7 C), Max:98 F (36.7 C)   Recent Labs Lab 05/08/16 2253 05/09/16 0247 05/09/16 2158 05/10/16 0037 05/10/16 0352 05/10/16 1951 05/11/16 0512 05/11/16 0827  05/11/16 1741 05/11/16 2148 05/12/16 0148 05/12/16 0539 05/12/16 1004  WBC  --  6.6  --   --  10.2  --  12.8*  --   --   --   --   --  16.0* 17.1*  CREATININE  --  2.53*  --   --  2.59*  --  3.19*  --   < > 2.47* 2.24* 2.02* 1.84* 1.92*  LATICACIDVEN 4.1* 6.2* 2.1* 1.8  --   --   --  1.3  --   --   --   --   --   --   VANCOTROUGH  --   --   --   --   --  22*  --   --   --   --   --   --   --   --   < > = values in this interval not displayed.  Estimated Creatinine Clearance: 39.3 mL/min (A) (by C-G formula based on SCr of 1.92 mg/dL (H)).    Allergies  Allergen Reactions  . Ciprofloxacin     Other reaction(s): Other (See Comments) Pt states he could hardly move  . Sitagliptin     Other reaction(s): Other (See Comments) Weakness    Antimicrobials this admission: 3/17 DXY >> 3/17 Zosyn >> 3/19 3/17 CFP >> 3/17 3/17 Vanc >> 3/17 3/19 CTX >>  Microbiology results: 3/15 BCx: NG 3/16 UCx: Sent  3/17 Sputum: NG  3/15 MRSA PCR: NG  Thank you for allowing pharmacy to be a part of this patient's care.  Ulice Dash, PharmD Clinical Pharmacist  05/12/2016

## 2016-05-12 NOTE — Progress Notes (Signed)
Initial Nutrition Assessment  DOCUMENTATION CODES:   Obesity unspecified  INTERVENTION:  -Recommend Mighty Shake II and Magic Cup supplements while on Nectar Thick diet with poor po intake   NUTRITION DIAGNOSIS:   Inadequate oral intake related to acute illness, social / environmental circumstances as evidenced by meal completion < 25%, per patient/family report.  GOAL:   Patient will meet greater than or equal to 90% of their needs  MONITOR:   PO intake, Labs, Weight trends, Supplement acceptance  REASON FOR ASSESSMENT:   Consult Poor PO  ASSESSMENT:    76 yo male admitted with acute respiratory failure secondary to penumonia with severe septic shock and ARF  Pt started on CRRT yesterday but discontinued this AM Pt sleepy but arouseable on visit today. Daughter at bedside. Daughter reports pt appetite declined 6 weeks ago when pt's wife passed away. Pt still eating 3 meals per day but smaller portions and not snacking through the day like he normally does. Noted recorded po intake 0-10% of meals recorded thus far.  Pt currently on Dysphagia I, Nectar Thick diet per SLP; pt's baseline diet is Regular with Thin Liquids. Pt wears dentures but currently not wearing as mouth sore from BiPap Daughter reports pt with 5 pound wt loss in 6 weeks; 2.2% wt loss which is not significant for time frame.  Labs: sodium 134 Meds: reviewed  Diet Order:  DIET - DYS 1 Room service appropriate? Yes with Assist; Fluid consistency: Nectar Thick  Skin:  Reviewed, no issues  Last BM:  05/08/16  Height:   Ht Readings from Last 1 Encounters:  05/08/16 5\' 9"  (1.753 m)    Weight:   Wt Readings from Last 1 Encounters:  05/12/16 227 lb 1.2 oz (103 kg)    Filed Weights   05/08/16 0906 05/08/16 1405 05/12/16 0500  Weight: 210 lb (95.3 kg) 207 lb 7.3 oz (94.1 kg) 227 lb 1.2 oz (103 kg)    BMI:  Body mass index is 33.53 kg/m.  Estimated Nutritional Needs:   Kcal:  1900-2200  kcals  Protein:  95-110 g  Fluid:  >/= 1.8 L  EDUCATION NEEDS:   No education needs identified at this time  [place], [place], [place] [phone] Pager  [phone] Weekend/On-Call Pager

## 2016-05-12 NOTE — Progress Notes (Signed)
CRRT Medication adjustment review:   . atorvastatin  40 mg Oral q1800  . budesonide (PULMICORT) nebulizer solution  0.5 mg Nebulization BID  . carvedilol  25 mg Oral BID WC  . cefTRIAXone (ROCEPHIN)  IV  2 g Intravenous Daily  . chlorhexidine  15 mL Mouth Rinse BID  . doxycycline (VIBRAMYCIN) IV  100 mg Intravenous Q12H  . famotidine  20 mg Oral QHS  . guaiFENesin  600 mg Oral BID  . insulin aspart  0-5 Units Subcutaneous QHS  . insulin aspart  0-9 Units Subcutaneous TID WC  . ipratropium-albuterol  3 mL Nebulization Q6H  . mouth rinse  15 mL Mouth Rinse q12n4p  . sodium chloride flush  3 mL Intravenous Q12H  . tamsulosin  0.4 mg Oral Daily   . heparin 1,500 Units/hr (05/12/16 1404)     Current medications do not appear to require adjustment for CRRT at this time.  Ulice Dash, PharmD Clinical Pharmacist  05/12/2016

## 2016-05-12 NOTE — Plan of Care (Signed)
BP (!) 155/104   Pulse 84   Temp 97.5 F (36.4 C) (Axillary)   Resp (!) 22   Ht 5\' 9"  (1.753 m)   Wt 103 kg (227 lb 1.2 oz)   SpO2 94%   BMI 33.53 kg/m     Intake/Output Summary (Last 24 hours) at 05/12/16 1110 Last data filed at 05/12/16 1000  Gross per 24 hour  Intake          1863.43 ml  Output             1351 ml  Net           512.43 ml     POC reviewed with patient, cont on q2 turns, vital signs closely monitored and any concerns addressed at this time. Will continue to monitor.  Pt tolerated CRRT ok with no problem  BMP Latest Ref Rng & Units 05/12/2016 05/12/2016 05/12/2016  Glucose 65 - 99 mg/dL 126(H) 126(H) 143(H)  BUN 6 - 20 mg/dL 55(H) 54(H) 61(H)  Creatinine 0.61 - 1.24 mg/dL 1.92(H) 1.84(H) 2.02(H)  BUN/Creat Ratio 10 - 22 - - -  Sodium 135 - 145 mmol/L 134(L) 132(L) 132(L)  Potassium 3.5 - 5.1 mmol/L 4.1 4.3 4.3  Chloride 101 - 111 mmol/L 101 99(L) 99(L)  CO2 22 - 32 mmol/L 25 25 26   Calcium 8.9 - 10.3 mg/dL 8.2(L) 8.2(L) 8.1(L)     CRRT discontinued at this time  Pt switched from BIPAP @ 40% to Nasal Can[place] 4 litres/min and oxygen saturation stay above 90%   There is a decrease in urine output over the last 24 hours, on this shift so far I have received 20 ml  Amiodarone will be discontinued this afternoon.  Will cont to monitor pt  Skin wnl except abrasion to the left upper face above the eyebrow post fall before admission

## 2016-05-12 NOTE — Progress Notes (Signed)
[place] for electrolyte management   Pharmacy consulted for electrolyte management for 76 yo male admitted with PNA/Afib.  Plan:  No replacement warranted at this time. Pharmacy will continue to monitor and adjust per consult.     Allergies  Allergen Reactions  . Ciprofloxacin     Other reaction(s): Other (See Comments) Pt states he could hardly move  . Sitagliptin     Other reaction(s): Other (See Comments) Weakness    Patient Measurements: Height: 5\' 9"  (175.3 cm) Weight: 227 lb 1.2 oz (103 kg) IBW/kg (Calculated) : 70.7   Vital Signs: Temp: 96.2 F (35.7 C) (03/19 1206) Temp Source: Axillary (03/19 1206) BP: 155/104 (03/19 1000) Pulse Rate: 84 (03/19 1100) Intake/Output from previous day: 03/18 0701 - 03/19 0700 In: 1958.3 [P.O.:180; I.V.:1128.3; IV Piggyback:650] Out: 1221 [Urine:360] Intake/Output from this shift: Total I/O In: 380.3 [P.O.:60; I.V.:70.3; IV Piggyback:250] Out: 165 [Urine:20; Other:145]  Labs:  Recent Labs  05/11/16 0512  05/12/16 0148 05/12/16 0539 05/12/16 1004  WBC 12.8*  --   --  16.0* 17.1*  HGB 11.4*  --   --  11.8* 12.0*  HCT 34.0*  --   --  35.1* 35.7*  PLT 103*  --   --  106* 104*  CREATININE 3.19*  < > 2.02* 1.84* 1.92*  MG 2.5*  --   --  2.0  --   PHOS  --   < > 3.8 3.6  3.5 3.9  ALBUMIN  --   < > 2.7* 2.6* 2.5*  < > = values in this interval not displayed. Estimated Creatinine Clearance: 39.3 mL/min (A) (by C-G formula based on SCr of 1.92 mg/dL (H)).  Lab Results  Component Value Date   K 4.1 05/12/2016     Medical History: Past Medical History:  Diagnosis Date  . Acute renal failure ([place])   . BPH (benign prostatic hyperplasia)   . Chronic atrial fibrillation ([place])   . Chronic diastolic CHF (congestive heart failure) ([name])    a. echo as above  . Chronic insomnia   . Coronary artery disease, non-occlusive 10/06/2013   a. cath 09/2013: pLAD 40, CTO Diag, ramus  40-->80, OM 50 mRCA 40, med rx  . Depression   . Diabetes mellitus with complication ([place])   . History of diverticulitis   . Hyperkalemia   . Hyperlipidemia   . Hypertension   . Malignant melanoma ([place])   . Mitral regurgitation    a. echo 09/2013: EF 55-60%, mildly dilated left atrrium, mild to moderate MR/TR  . Sleep apnea in adult    Pharmacy will continue to monitor and adjust per consult.   Ulice Dash D 05/12/2016,2:09 PM

## 2016-05-12 NOTE — Progress Notes (Addendum)
ANTICOAGULATION CONSULT NOTE - Follow Up Consult  Pharmacy Consult for Heparin drip             Indication: atrial fibrillation       Allergies  Allergen Reactions  . Ciprofloxacin     Other reaction(s): Other (See Comments) Pt states he could hardly move  . Sitagliptin     Other reaction(s): Other (See Comments) Weakness    Patient Measurements: Height: 5\' 9"  (175.3 cm) Weight: 207 lb 7.3 oz (94.1 kg) IBW/kg (Calculated) : 70.7 Heparin Dosing Weight: 90 kg  Vital Signs: Temp: 96.2 F (35.7 C) (03/18 1700) Temp Source: Axillary (03/18 1700) BP: 127/102 (03/18 1600) Pulse Rate: 71 (03/18 1600)  Labs:  Recent Labs (last 2 labs)    Recent Labs  05/09/16 0247  05/10/16 0352  05/10/16 1951 05/11/16 0030 05/11/16 0512 05/11/16 0827 05/11/16 1327 05/11/16 1342 05/11/16 1741  HGB 13.9  --  11.9*  --   --   --  11.4*  --   --   --   --   HCT 41.8  --  34.3*  --   --   --  34.0*  --   --   --   --   PLT 105*  --  99*  --   --   --  103*  --   --   --   --   HEPARINUNFRC  --   < > 0.21*  < > 0.48  --  3.34*  --   --   --  0.26*  CREATININE 2.53*  --  2.59*  --   --   --  3.19*  --   --  2.86* 2.47*  TROPONINI  --   < > 0.45*  < >  --  0.55*  --  0.38* 0.42*  --   --   < > = values in this interval not displayed.    Estimated Creatinine Clearance: 29.3 mL/min (A) (by C-G formula based on SCr of 2.47 mg/dL (H)).   Assessment: 76 yo male with PMH of A. Fib. Patient has been taking Dabigatran BID at home until he runs out of current prescription- then plan was for him to start apixaban 5mg  BID. Patient dose not know when he took his last dose of Dabigatran, but guesses it was probably on 3/13 at some point.   Goal of Therapy: Heparin level 0.3-0.7 units/ml aPTT 66-102seconds Monitor platelets by anticoagulation protocol: Yes  Plan: Continue heparin infusion at current rate and recheck a HL in 8 hours.   3/17 HL @ 0400 0.21 subtherapeutic.  Will bolus w/ 1400 units x 1 and increase drip rate to 1500 units/hour and recheck HL @ 1200. Will continue to monitor CBC.  3/17 HL @ 1236= 0.26.  Will give bolus of 1300 units and increase Heparin to 1700 units/hr. Recheck HL at 1930.  3/17 1951 HL therapeutic x 1. Continue current rate. Will recheck HL in 8 hours.  3/18 1741 HL subtherapeutic x 1. Increase to 1400 units/hr. Will recheck HL in 8 hours  3/19 0148 HL 0.42 therapeutic x 1. Will continue current rate and check next HL 3/19 @ 1000. Will continue to monitor CBC.  Thank you for this consult.  Tobie Lords, PharmD, BCPS Clinical Pharmacist 05/12/2016

## 2016-05-12 NOTE — Evaluation (Signed)
Clinical/Bedside Swallow Evaluation Patient Details  Name: Todd Valentine MRN: [id number] Date of Birth: Aug 07, 1940  Today's Date: 05/12/2016 Time: SLP Start Time (ACUTE ONLY): 0240 SLP Stop Time (ACUTE ONLY): 1329 SLP Time Calculation (min) (ACUTE ONLY): 44 min  Past Medical History:  Past Medical History:  Diagnosis Date  . Acute renal failure ([place])   . BPH (benign prostatic hyperplasia)   . Chronic atrial fibrillation ([place])   . Chronic diastolic CHF (congestive heart failure) ([name])    a. echo as above  . Chronic insomnia   . Coronary artery disease, non-occlusive 10/06/2013   a. cath 09/2013: pLAD 40, CTO Diag, ramus 40-->80, OM 50 mRCA 40, med rx  . Depression   . Diabetes mellitus with complication ([place])   . History of diverticulitis   . Hyperkalemia   . Hyperlipidemia   . Hypertension   . Malignant melanoma ([place])   . Mitral regurgitation    a. echo 09/2013: EF 55-60%, mildly dilated left atrrium, mild to moderate MR/TR  . Sleep apnea in adult    Past Surgical History:  Past Surgical History:  Procedure Laterality Date  . APPENDECTOMY    . CARDIAC CATHETERIZATION  10/06/2013  . COLONOSCOPY    . COLONOSCOPY WITH PROPOFOL N/A 11/23/2014   Procedure: COLONOSCOPY WITH PROPOFOL;  Surgeon: Manya Silvas, MD;  Location[facility] ENDOSCOPY;  Service: Endoscopy;  Laterality: N/A;  . MOHS SURGERY    . removal tubular adenoma     HPI: Per admitting history and physical:   Todd Valentine  is a 76 y.o. male with a known history of Chronic atrial fibrillation, history of congestive heart failure, history of coronary artery disease, diabetes, hypertension, hyperlipidemia, history of melanoma who presents to the hospital due to weakness and a fall.  Assessment / Plan / Recommendation Clinical Impression  Pt presents with delayed throat clear and coughing with possible aspiration of thin liquids. Pt tolerated nectar thick liquids and purees well. DNT solids as Pt reports his mouth is  too sore to try wearing his dentures. Oral mech exam revealed areas of redness throughout the mouth as well as a white film coating the back of the tongue. Oral strength and ROM was adequate. Pt noted to be SOB while taking PO's and fatigued easily. Pt is currently on nasal canula but has been requiring Bipap at night. Rec continuing with Dyspgia 1 diet with nectar thick liquids for now. ST to reassess with thinner consistencies and solids as Pt becomes improves.   SLP Visit Diagnosis: Dysphagia, oropharyngeal phase (R13.12)    Aspiration Risk  Moderate aspiration risk    Diet Recommendation Dysphagia 1 (Puree);Nectar-thick liquid   Liquid Administration via: Cup Medication Administration: Crushed with puree Supervision: Staff to assist with self feeding Compensations: Slow rate;Small sips/bites;Other (Comment) (allow periods of rest) Postural Changes: Seated upright at 90 degrees;Remain upright for at least 30 minutes after po intake    Other  Recommendations Oral Care Recommendations: Oral care BID   Follow up Recommendations        Frequency and Duration min 3x week  2 weeks       Prognosis Prognosis for Safe Diet Advancement: Good      Swallow Study   General Type of Study: Bedside Swallow Evaluation Diet Prior to this Study: Regular Temperature Spikes Noted: No Respiratory Status: Nasal cannula History of Recent Intubation: No Behavior/Cognition: Cooperative;Lethargic/Drowsy Oral Cavity Assessment: Lesions;Dry;Other (comment) (Complained or soreness) Oral Care Completed by SLP: No Oral Cavity - Dentition: Dentures,  top Self-Feeding Abilities: Needs assist Patient Positioning: Upright in bed Baseline Vocal Quality: Normal Volitional Cough: Weak Volitional Swallow: Able to elicit    Oral/Motor/Sensory Function Overall Oral Motor/Sensory Function: Within functional limits   Ice Chips Ice chips: Within functional limits Presentation: Spoon   Thin Liquid Thin Liquid:  Impaired Presentation: Spoon;Self Fed;Cup;Straw Oral Phase Functional Implications:  (occasional anterior leakage) Pharyngeal  Phase Impairments: Suspected delayed Swallow;Cough - Delayed;Throat Clearing - Delayed    Nectar Thick Nectar Thick Liquid: Within functional limits Presentation: Cup;Self Fed   Honey Thick     Puree Puree: Within functional limits Presentation: Spoon   Solid   GO   Solid: Not tested        [name] 05/12/2016,1:29 PM

## 2016-05-12 NOTE — Progress Notes (Signed)
Todd Valentine    ASSESSMENT/PLAN   Todd Valentine  is a 76 y.o. male with a known history of Chronic atrial fibrillation, history of congestive heart failure, history of coronary artery disease, diabetes, hypertension, hyperlipidemia, history of melanoma who presents to the hospital due to weakness and a fall. Patient was found to have left-sided pneumonia with severe septic shock, acute renal failure, placed on CRRT.  PULMONARY A: Acute hypoxic respiratory failure secondary to pneumonia. Objective sleep apnea, not currently using CPAP at home. P:   -Continue Zosyn, doxycycline. -Wean down BiPAP. -Pap at night.   CARDIOVASCULAR A: Mildly elevated troponin. Atrial fibrillation, currently rate controlled, currently on amiodarone infusion. Currently on heparin infusion Essential hypertension. Echo 05/08/16: EF 55%.  P:  Will consider transition to Eliquis once off of the CRRT Will dc amiodarone IV.  Continue Coreg.  RENAL A:  Acute renal failure secondary to septic shock, currently on CRRT. P:   -We will discuss with nephrology in regards to a discontinuation of CRRT  GASTROINTESTINAL A:  --  HEMATOLOGIC A: -- P:    INFECTIOUS A:  -- P:    Micro/culture results:  MRSA screen. 3/15: Negative. BCx2 3/15: Negative 2. UC 3/17: Negative Sputum --  Antibiotics: Zosyn 3/18>> Doxycycline 3/16>>  ENDOCRINE A: Continue sliding scale insulin.  NEUROLOGIC A:  --   MAJOR EVENTS/TEST RESULTS:   Best Practices  DVT Prophylaxis: On IV heparin. GI Prophylaxis: --   ---------------------------------------   ----------------------------------------   Name: Todd Valentine MRN: [id number] DOB: 1940/04/20    ADMISSION DATE:  05/08/2016     SUBJECTIVE:   Pt currently on the BiPAP can not provide history or review of systems.   Review of Systems:  --   VITAL SIGNS: Temp:  [96.2 F (35.7 C)-98 F (36.7 C)]  97.7 F (36.5 C) (03/19 0600) Pulse Rate:  [71-94] 94 (03/19 0700) Resp:  [16-28] 28 (03/19 0700) BP: (117-165)/(79-124) 165/84 (03/19 0700) SpO2:  [85 %-100 %] 98 % (03/19 0700) FiO2 (%):  [40 %-56 %] 40 % (03/19 0400) Weight:  [227 lb 1.2 oz (103 kg)] 227 lb 1.2 oz (103 kg) (03/19 0500) HEMODYNAMICS:   VENTILATOR SETTINGS: FiO2 (%):  [40 %-56 %] 40 % INTAKE / OUTPUT:  Intake/Output Summary (Last 24 hours) at 05/12/16 0754 Last data filed at 05/12/16 0700  Gross per 24 hour  Intake          1958.28 ml  Output             1172 ml  Net           786.28 ml    PHYSICAL EXAMINATION: Physical Examination:   VS: BP (!) 165/84   Pulse 94   Temp 97.7 F (36.5 C) (Axillary)   Resp (!) 28   Ht 5\' 9"  (1.753 m)   Wt 227 lb 1.2 oz (103 kg)   SpO2 98%   BMI 33.53 kg/m   General Appearance: No distress  Neuro:without focal findings, mental status , reduced HEENT: PERRLA, EOM intact. Pulmonary: Diminished bilateral breath sounds   CardiovascularNormal S1,S2.  No m/r/g.   Abdomen: Benign, Soft, non-tender. Renal:  No costovertebral tenderness  GU:  Not performed at this time. Endocrine: No evident thyromegaly. Skin:   warm, no rashes, no ecchymosis  Extremities: normal, no cyanosis, clubbing.   LABS:   LABORATORY PANEL:   CBC  Recent Labs Lab 05/12/16 0539  WBC 16.0*  HGB 11.8*  HCT  35.1*  PLT 106*    Chemistries   Recent Labs Lab 05/08/16 1101  05/12/16 0539  NA 138  < > 132*  K 2.8*  < > 4.3  CL 106  < > 99*  CO2 23  < > 25  GLUCOSE 119*  < > 126*  BUN 24*  < > 54*  CREATININE 1.80*  < > 1.84*  CALCIUM 7.9*  < > 8.2*  MG 1.3*  < > 2.0  PHOS  --   < > 3.6  3.5  AST 43*  --   --   ALT 24  --   --   ALKPHOS 42  --   --   BILITOT 3.2*  --   --   < > = values in this interval not displayed.   Recent Labs Lab 05/11/16 0738 05/11/16 1124 05/11/16 1336 05/11/16 1640 05/11/16 2108 05/12/16 0737  GLUCAP 219* 171* 180* 149* 116* 104*    Recent  Labs Lab 05/10/16 2125 05/11/16 0000 05/11/16 0530  PHART 7.24* 7.13* 7.25*  PCO2ART 63* 84* 58*  PO2ART 65* 72* 106    Recent Labs Lab 05/08/16 1101  05/11/16 2148 05/12/16 0148 05/12/16 0539  AST 43*  --   --   --   --   ALT 24  --   --   --   --   ALKPHOS 42  --   --   --   --   BILITOT 3.2*  --   --   --   --   ALBUMIN 3.0*  < > 2.7* 2.7* 2.6*  < > = values in this interval not displayed.  Cardiac Enzymes  Recent Labs Lab 05/11/16 1327  TROPONINI 0.42*    RADIOLOGY:  Dg Chest Port 1 View  Result Date: 05/11/2016 CLINICAL DATA:  Acute respiratory failure. EXAM: PORTABLE CHEST 1 VIEW COMPARISON:  05/10/2016, 05/08/2016 and chest CT 05/08/2016 FINDINGS: Tubing apparatus over the upper left chest of uncertain clinical significance. Lungs are adequately inflated and demonstrate persistent airspace process over the left midlung with slight worsening airspace density in the right base. Cardiomediastinal silhouette and remainder the exam is unchanged. IMPRESSION: Persistent bilateral airspace process left worse than right likely ongoing infection. Electronically Signed   By: Marin Olp M.D.   On: 05/11/2016 07:37   Dg Chest Port 1 View  Result Date: 05/10/2016 CLINICAL DATA:  Sepsis EXAM: PORTABLE CHEST 1 VIEW COMPARISON:  05/09/2016 FINDINGS: Shallow inspiration. Cardiac enlargement. Persistent consolidation in the left middle lung consistent with pneumonia. Developing atelectasis or infiltration in the right lung base. No pneumothorax. No blunting of costophrenic angles. Calcification of the aorta. IMPRESSION: No change in left mid lung consolidation. Developing infiltration or atelectasis in the right lung base. Cardiac enlargement. Electronically Signed   By: Lucienne Capers M.D.   On: 05/10/2016 21:47       --Marda Stalker, MD.  ICU Pager: [phone] [facility] Office Number: [phone]   05/12/2016

## 2016-05-12 NOTE — Progress Notes (Signed)
This note also relates to the following rows which could not be included: Pulse Rate - Cannot attach notes to unvalidated device data ECG Heart Rate - Cannot attach notes to unvalidated device data Resp - Cannot attach notes to unvalidated device data SpO2 - Cannot attach notes to unvalidated device data    05/12/16 1206  Vitals  Temp (!) 96.2 F (35.7 C)  Temp Source Axillary    Warming blanket applied

## 2016-05-12 NOTE — Progress Notes (Signed)
Patient Name: Todd Valentine Date of Encounter: 05/12/2016  Primary Cardiologist: [facility] Problem List     Principal Problem:   Sepsis [facility]) Active Problems:   Chronic atrial fibrillation ([name])   Aspiration pneumonia ([name])   Atrial fibrillation with RVR ([name])   Hypokalemia   Gastroenteritis   AKI (acute kidney injury) ([place])   Hypomagnesemia   Leukopenia   Coronary artery disease involving native coronary artery of native heart without angina pectoris   Essential hypertension   Demand ischemia ([place])     Subjective   Tolerated HFNC during the day on 3/18. Back on BiPAP overnight. Planning to wean BiPAP this morning. Heart rate well controlled on amiodarone gtt for rate as well as Coreg. Toelrating PO. Undergoing CRRT. REnal function improving. Albumin remains low. Bump in white count this AM. PCT improving. Remains on Zosyn.   Inpatient Medications    Scheduled Meds: . budesonide (PULMICORT) nebulizer solution  0.5 mg Nebulization BID  . carvedilol  12.5 mg Oral BID WC  . chlorhexidine  15 mL Mouth Rinse BID  . doxycycline (VIBRAMYCIN) IV  100 mg Intravenous Q12H  . famotidine  20 mg Oral QHS  . guaiFENesin  600 mg Oral BID  . insulin aspart  0-5 Units Subcutaneous QHS  . insulin aspart  0-9 Units Subcutaneous TID WC  . ipratropium-albuterol  3 mL Nebulization Q6H  . mouth rinse  15 mL Mouth Rinse q12n4p  . methylPREDNISolone (SOLU-MEDROL) injection  40 mg Intravenous Q12H  . piperacillin-tazobactam (ZOSYN)  IV  3.375 g Intravenous Q8H  . sodium chloride flush  3 mL Intravenous Q12H  . tamsulosin  0.4 mg Oral Daily   Continuous Infusions: . amiodarone 60 mg/hr (05/12/16 0700)  . heparin 1,400 Units/hr (05/12/16 0700)  . pureflow 3 each (05/12/16 0555)   PRN Meds: acetaminophen **OR** acetaminophen, heparin, labetalol, morphine injection, ondansetron **OR** ondansetron (ZOFRAN) IV, promethazine, traZODone   Vital Signs    Vitals:   05/12/16 0400  05/12/16 0500 05/12/16 0600 05/12/16 0700  BP: (!) 149/91 (!) 133/106 (!) 160/101 (!) 165/84  Pulse: 89 89 83 94  Resp: (!) 21 17 19  (!) 28  Temp: 97.8 F (36.6 C)  97.7 F (36.5 C)   TempSrc: Axillary  Axillary   SpO2: 100% 100% 100% 98%  Weight:  227 lb 1.2 oz (103 kg)    Height:        Intake/Output Summary (Last 24 hours) at 05/12/16 0755 Last data filed at 05/12/16 0700  Gross per 24 hour  Intake          1958.28 ml  Output             1172 ml  Net           786.28 ml   Filed Weights   05/08/16 0906 05/08/16 1405 05/12/16 0500  Weight: 210 lb (95.3 kg) 207 lb 7.3 oz (94.1 kg) 227 lb 1.2 oz (103 kg)    Physical Exam    GEN: Well nourished, well developed, in no acute distress.  HEENT: Grossly normal.  Neck: Supple, no JVD, carotid bruits, or masses. Cardiac: Irregularly irregular, no murmurs, rubs, or gallops. No clubbing, cyanosis, trace pre-tibial edema.  Radials/DP/PT 2+ and equal bilaterally.  Respiratory:  Diminished breath sounds bilaterally with rhonchi L>R. On BiPAP.  GI: Soft, nontender, nondistended, BS + x 4. MS: no deformity or atrophy. Skin: warm and dry, no rash. Neuro:  Strength and sensation are intact. Psych:  AAOx3.  Normal affect.  Labs    CBC  Recent Labs  05/11/16 0512 05/12/16 0539  WBC 12.8* 16.0*  HGB 11.4* 11.8*  HCT 34.0* 35.1*  MCV 88.5 88.2  PLT 103* 160*   Basic Metabolic Panel  Recent Labs  05/11/16 0512  05/12/16 0148 05/12/16 0539  NA 132*  < > 132* 132*  K 4.5  < > 4.3 4.3  CL 93*  < > 99* 99*  CO2 25  < > 26 25  GLUCOSE 231*  < > 143* 126*  BUN 89*  < > 61* 54*  CREATININE 3.19*  < > 2.02* 1.84*  CALCIUM 8.0*  < > 8.1* 8.2*  MG 2.5*  --   --  2.0  PHOS  --   < > 3.8 3.6  3.5  < > = values in this interval not displayed. Liver Function Tests  Recent Labs  05/12/16 0148 05/12/16 0539  ALBUMIN 2.7* 2.6*   No results for input(s): LIPASE, AMYLASE in the last 72 hours. Cardiac Enzymes  Recent Labs   05/11/16 0030 05/11/16 0827 05/11/16 1327  TROPONINI 0.55* 0.38* 0.42*   BNP Invalid input(s): POCBNP D-Dimer No results for input(s): DDIMER in the last 72 hours. Hemoglobin A1C No results for input(s): HGBA1C in the last 72 hours. Fasting Lipid Panel No results for input(s): CHOL, HDL, LDLCALC, TRIG, CHOLHDL, LDLDIRECT in the last 72 hours. Thyroid Function Tests No results for input(s): TSH, T4TOTAL, T3FREE, THYROIDAB in the last 72 hours.  Invalid input(s): FREET3  Telemetry    Afib, 70s-80s bpm - Personally Reviewed  ECG    n/a - Personally Reviewed  Radiology    Dg Chest Port 1 View  Result Date: 05/11/2016 CLINICAL DATA:  Acute respiratory failure. EXAM: PORTABLE CHEST 1 VIEW COMPARISON:  05/10/2016, 05/08/2016 and chest CT 05/08/2016 FINDINGS: Tubing apparatus over the upper left chest of uncertain clinical significance. Lungs are adequately inflated and demonstrate persistent airspace process over the left midlung with slight worsening airspace density in the right base. Cardiomediastinal silhouette and remainder the exam is unchanged. IMPRESSION: Persistent bilateral airspace process left worse than right likely ongoing infection. Electronically Signed   By: Marin Olp M.D.   On: 05/11/2016 07:37   Dg Chest Port 1 View  Result Date: 05/10/2016 CLINICAL DATA:  Sepsis EXAM: PORTABLE CHEST 1 VIEW COMPARISON:  05/09/2016 FINDINGS: Shallow inspiration. Cardiac enlargement. Persistent consolidation in the left middle lung consistent with pneumonia. Developing atelectasis or infiltration in the right lung base. No pneumothorax. No blunting of costophrenic angles. Calcification of the aorta. IMPRESSION: No change in left mid lung consolidation. Developing infiltration or atelectasis in the right lung base. Cardiac enlargement. Electronically Signed   By: Lucienne Capers M.D.   On: 05/10/2016 21:47    Cardiac Studies   TTE 05/08/16: Study Conclusions  - Left ventricle:  The cavity size was mildly reduced. There was   mild concentric hypertrophy. Systolic function was normal. The   estimated ejection fraction was in the range of 50% to 55%.   Regional wall motion abnormalities cannot be excluded. The study   is not technically sufficient to allow evaluation of LV diastolic   function. - Mitral valve: There was mild regurgitation. - Left atrium: The atrium was mildly dilated. - Right ventricle: The cavity size was mildly dilated. Wall   thickness was normal. Systolic function was mildly reduced. - Pulmonary arteries: Systolic pressure could not be accurately   estimated.  Impressions:  - Rate  is atrial fibrillation with RVR. Challenging image quality.  Patient Profile     76 y.o. male with history of chronic atrial fibrillation, last seen April 25 2016 In clinic at which time he was doing well, prior history chronic diastolic CHF, type 2 diabetes, three-vessel coronary disease by catheterization August 2015, presenting with chills, fever, malaise, shortness of breath consistent with sepsis 2/2 pneumonia complicated by atrial fibrillation with RVR heart rate up to 170 bpm.  Assessment & Plan    1. Acute respiratory failure with hypercapnia: -Likely multifactorial including sepsis 2/2 PNA and Afib with RVR -Wean BiPAP as toelrated  2. Chronic Afib with RVR: -Heart rates well controlled in the 70s to 80s bpm -Remains on full-dose amiodarone infusion, wean to 1/2 dose today with discontinuation either this PM or AM of 3/20 -Continue Coreg for rate control, titrate as needed to assist with his BP as well -If needed could use short-acting diltiazem as BP has improved -Continue heparin gtt for now while undergoing CRRT. If his dialysis is only short term he can go back on Pradaxa vs Eliquis (was transitioning to Eliquis from Pradaxa as an outpatient). If needs long term HD would use Coumadin -CHADS2VASc at least 6 (CHF, HTN, age x 2, DM, vascular  disease)  3. ARF: -Likely ATN 2/2 sepsis and hypotension -CRRT per renal  4. Elevated troponin: -No angina -Likely supply demand ischemia 2/2 sepsis with PNA, Afib with RVR, hypotension, and ARF -Echo with preserved EF -Already on heparin gtt as above -Place on Lipitor in place of home simvastatin given possibly need of diltiazem for Afib/BP  5. HTN: -Could add short-term diltiazem vs titrate up Coreg as above  Signed, Christell Faith, PA-C [place] Pager: [phone] 05/12/2016, 7:55 AM

## 2016-05-13 LAB — CULTURE, BLOOD (ROUTINE X 2)
Culture: NO GROWTH
Culture: NO GROWTH

## 2016-05-13 LAB — RENAL FUNCTION PANEL
Albumin: 2.3 g/dL — ABNORMAL LOW (ref 3.5–5.0)
Anion gap: 7 (ref 5–15)
BUN: 77 mg/dL — ABNORMAL HIGH (ref 6–20)
CO2: 26 mmol/L (ref 22–32)
Calcium: 7.7 mg/dL — ABNORMAL LOW (ref 8.9–10.3)
Chloride: 100 mmol/L — ABNORMAL LOW (ref 101–111)
Creatinine, Ser: 2.65 mg/dL — ABNORMAL HIGH (ref 0.61–1.24)
GFR calc Af Amer: 26 mL/min — ABNORMAL LOW (ref >60)
GFR calc non Af Amer: 22 mL/min — ABNORMAL LOW (ref >60)
Glucose, Bld: 129 mg/dL — ABNORMAL HIGH (ref 65–99)
Phosphorus: 4.9 mg/dL — ABNORMAL HIGH (ref 2.5–4.6)
Potassium: 4.2 mmol/L (ref 3.5–5.1)
Sodium: 133 mmol/L — ABNORMAL LOW (ref 135–145)

## 2016-05-13 LAB — CBC
HCT: 34.4 % — ABNORMAL LOW (ref 40.0–52.0)
Hemoglobin: 11.7 g/dL — ABNORMAL LOW (ref 13.0–18.0)
MCH: 29.9 pg (ref 26.0–34.0)
MCHC: 34 g/dL (ref 32.0–36.0)
MCV: 88.1 fL (ref 80.0–100.0)
Platelets: 119 10*3/uL — ABNORMAL LOW (ref 150–440)
RBC: 3.9 MIL/uL — ABNORMAL LOW (ref 4.40–5.90)
RDW: 14.4 % (ref 11.5–14.5)
WBC: 20 10*3/uL — ABNORMAL HIGH (ref 3.8–10.6)

## 2016-05-13 LAB — GLUCOSE, CAPILLARY
Glucose-Capillary: 119 mg/dL — ABNORMAL HIGH (ref 65–99)
Glucose-Capillary: 125 mg/dL — ABNORMAL HIGH (ref 65–99)
Glucose-Capillary: 111 mg/dL — ABNORMAL HIGH (ref 65–99)
Glucose-Capillary: 111 mg/dL — ABNORMAL HIGH (ref 65–99)

## 2016-05-13 LAB — HEPARIN LEVEL (UNFRACTIONATED): Heparin Unfractionated: 0.4 IU/mL (ref 0.30–0.70)

## 2016-05-13 LAB — PROCALCITONIN: Procalcitonin: 30.06 ng/mL

## 2016-05-13 MED ORDER — BISACODYL 10 MG RE SUPP
10.0000 mg | Freq: Every day | RECTAL | Status: DC | PRN
  Administered 2016-05-13: 10 mg via RECTAL
  Filled 2016-05-13: qty 1

## 2016-05-13 MED ORDER — DOXYCYCLINE HYCLATE 100 MG PO TABS
100.0000 mg | ORAL_TABLET | Freq: Two times a day (BID) | ORAL | Status: DC
  Administered 2016-05-13 – 2016-05-19 (×12): 100 mg via ORAL
  Filled 2016-05-13 (×14): qty 1

## 2016-05-13 MED ORDER — HALOPERIDOL LACTATE 5 MG/ML IJ SOLN: 2.5000 mg | Freq: Once | INTRAMUSCULAR | Status: DC

## 2016-05-13 NOTE — Progress Notes (Signed)
[facility] Progess Note    ASSESSMENT/PLAN   Todd Valentine  is a 76 y.o. male with a known history of Chronic atrial fibrillation, history of congestive heart failure, history of coronary artery disease, diabetes, hypertension, hyperlipidemia,presents to the hospital due to weakness and a fall. Patient was found to have left-sided pneumonia with severe septic shock, acute renal failure, placed on CRRT.  PULMONARY A: Acute hypoxic respiratory failure secondary to pneumonia-  Objective sleep apnea, not currently using CPAP regularly at home. P:   -Continue Zosyn, doxy.  -Continue bipap at night. Pt must use CPAP at night upon discharge.     CARDIOVASCULAR A: Mildly elevated troponin. Atrial fibrillation, currently rate controlled. Currently on heparin infusion Essential hypertension. Echo 05/08/16: EF 55%.  P:  transition to Eliquis Continue Coreg.  RENAL A:  Acute renal failure secondary to septic shock, currently on intermittent HD, may require long term.  P:   -Nephro following.   GASTROINTESTINAL A:  --  HEMATOLOGIC A: -- P:    INFECTIOUS A:  --Pneumonia P:    Micro/culture results:  MRSA screen. 3/15: Negative. BCx2 3/15: Negative 2. UC 3/17: Negative Sputum --  Antibiotics: Zosyn 3/18>> Doxycycline 3/16>>  ENDOCRINE A: Continue sliding scale insulin.  NEUROLOGIC A:  --   MAJOR EVENTS/TEST RESULTS:   Best Practices  DVT Prophylaxis: On IV heparin. GI Prophylaxis: Famotidine.    ---------------------------------------   ----------------------------------------   Name: Todd Valentine MRN: [id number] DOB: 05/31/1940    ADMISSION DATE:  05/08/2016     SUBJECTIVE:   Pt currently on the BiPAP can not provide history or review of systems.   Review of Systems:  --   VITAL SIGNS: Temp:  [95.7 F (35.4 C)-98.8 F (37.1 C)] 98.2 F (36.8 C) (03/20 0900) Pulse Rate:  [72-96] 85 (03/20 1000) Resp:   [15-27] 26 (03/20 1000) BP: (115-143)/(64-87) 115/81 (03/20 1000) SpO2:  [90 %-99 %] 96 % (03/20 1000) FiO2 (%):  [40 %] 40 % (03/20 0800) Weight:  [230 lb 9.6 oz (104.6 kg)] 230 lb 9.6 oz (104.6 kg) (03/20 0446) HEMODYNAMICS:   VENTILATOR SETTINGS: FiO2 (%):  [40 %] 40 % INTAKE / OUTPUT:  Intake/Output Summary (Last 24 hours) at 05/13/16 1110 Last data filed at 05/13/16 0900  Gross per 24 hour  Intake          1382.73 ml  Output              485 ml  Net           897.73 ml    PHYSICAL EXAMINATION: Physical Examination:   VS: BP 115/81   Pulse 85   Temp 98.2 F (36.8 C)   Resp (!) 26   Ht 5\' 9"  (1.753 m)   Wt 230 lb 9.6 oz (104.6 kg)   SpO2 96%   BMI 34.05 kg/m   General Appearance: No distress  Neuro:without focal findings, mental status , reduced HEENT: PERRLA, EOM intact. Pulmonary: Decreased bilateral breath sounds   CardiovascularNormal S1,S2.  No m/r/g.   Abdomen: Benign, Soft, non-tender. Renal:  No costovertebral tenderness  GU:  Not performed at this time. Endocrine: No evident thyromegaly. Skin:   warm, no rashes, no ecchymosis  Extremities: normal, no cyanosis, clubbing.   LABS:   LABORATORY PANEL:   CBC  Recent Labs Lab 05/13/16 0459  WBC 20.0*  HGB 11.7*  HCT 34.4*  PLT 119*    Chemistries   Recent Labs Lab 05/08/16 1101  05/12/16 0539  05/13/16 0459  NA 138  < > 132*  < > 133*  K 2.8*  < > 4.3  < > 4.2  CL 106  < > 99*  < > 100*  CO2 23  < > 25  < > 26  GLUCOSE 119*  < > 126*  < > 129*  BUN 24*  < > 54*  < > 77*  CREATININE 1.80*  < > 1.84*  < > 2.65*  CALCIUM 7.9*  < > 8.2*  < > 7.7*  MG 1.3*  < > 2.0  --   --   PHOS  --   < > 3.6  3.5  < > 4.9*  AST 43*  --   --   --   --   ALT 24  --   --   --   --   ALKPHOS 42  --   --   --   --   BILITOT 3.2*  --   --   --   --   < > = values in this interval not displayed.   Recent Labs Lab 05/12/16 0737 05/12/16 1141 05/12/16 1804 05/12/16 2117 05/13/16 0738  05/13/16 1055  GLUCAP 104* 118* 155* 127* 119* 125*    Recent Labs Lab 05/10/16 2125 05/11/16 0000 05/11/16 0530  PHART 7.24* 7.13* 7.25*  PCO2ART 63* 84* 58*  PO2ART 65* 72* 106    Recent Labs Lab 05/08/16 1101  05/12/16 0539 05/12/16 1004 05/13/16 0459  AST 43*  --   --   --   --   ALT 24  --   --   --   --   ALKPHOS 42  --   --   --   --   BILITOT 3.2*  --   --   --   --   ALBUMIN 3.0*  < > 2.6* 2.5* 2.3*  < > = values in this interval not displayed.  Cardiac Enzymes  Recent Labs Lab 05/11/16 1327  TROPONINI 0.42*    RADIOLOGY:  No results found.     Marda Stalker, MD.  ICU Pager: [phone] [facility] Office Number: [phone]   05/13/2016    Critical Care Attestation.  I have personally obtained a history, examined the patient, evaluated laboratory and imaging results, formulated the assessment and plan and placed orders. The Patient requires high complexity decision making for assessment and support, frequent evaluation and titration of therapies, application of advanced monitoring technologies and extensive interpretation of multiple databases. The patient has critical illness that could lead imminently to failure of 1 or more organ systems and requires the highest level of physician preparedness to intervene.  Critical Care Time devoted to patient care services described in this note is 35 minutes and is exclusive of time spent in procedures.

## 2016-05-13 NOTE — Progress Notes (Addendum)
ANTICOAGULATION CONSULT NOTE - Follow Up Consult  Pharmacy Consult for Heparin drip             Indication: atrial fibrillation       Allergies  Allergen Reactions  . Ciprofloxacin     Other reaction(s): Other (See Comments) Pt states he could hardly move  . Sitagliptin     Other reaction(s): Other (See Comments) Weakness    Patient Measurements: Height: 5\' 9"  (175.3 cm) Weight: 207 lb 7.3 oz (94.1 kg) IBW/kg (Calculated) : 70.7 Heparin Dosing Weight: 90 kg  Vital Signs: Temp: 97.9 F (36.6 C) (03/17 2013) Temp Source: Oral (03/17 2013) BP: 150/111 (03/17 1700) Pulse Rate: 107 (03/17 1700)  Labs:  HL= 0.32 (0.42)  CBC Latest Ref Rng & Units 05/12/2016 05/12/2016 05/11/2016  WBC 3.8 - 10.6 K/uL 17.1(H) 16.0(H) 12.8(H)  Hemoglobin 13.0 - 18.0 g/dL 12.0(L) 11.8(L) 11.4(L)  Hematocrit 40.0 - 52.0 % 35.7(L) 35.1(L) 34.0(L)  Platelets 150 - 440 K/uL 104(L) 106(L) 103(L)     Assessment: 76 yo male with PMH of A. Fib. Patient has been taking Dabigatran BID at home until he runs out of current prescription- then plan was for him to start apixaban 5mg  BID. Patient dose not know when he took his last dose of Dabigatran, but guesses it was probably on 3/13 at some point.   Goal of Therapy: Heparin level 0.3-0.7 units/ml aPTT 66-102seconds Monitor platelets by anticoagulation protocol: Yes  Plan: Heparin level at goal but at low end of goal and decreasing. Will increase heparin drip to 1500 unit/hr and recheck a HL in 8 hours.   3/19 22:00 heparin level 0.50. Continue current regimen. Recheck with tomorrow AM labs.  3/20 AM heparin level 0.40. Continue current regimen. Recheck heparin level and CBC with tomorrow AM labs.  Ulice Dash, PharmD Clinical Pharmacist  05/13/2016

## 2016-05-13 NOTE — Progress Notes (Signed)
76 year old patient was on the hospitalist list admitted for pneumonia, acute kidney injury, atrial fibrillation. Patient now being transferred out of ICU. Received CRRT and may need intermittent hemodialysis. BiPAP at night. Improving.

## 2016-05-13 NOTE — Progress Notes (Signed)
   05/13/16 0900  Intake (mL)  P.O. 240 mL  doxycycline (VIBRAMYCIN) 100 mg in dextrose 5 % 250 mL IVPB  Volume (mL) 250  cefTRIAXone (ROCEPHIN) 2 g in dextrose 5 % 50 mL IVPB  Volume (mL) 50  heparin ADULT infusion 100 units/mL (25000 units/297mL sodium chloride 0.45%)  Heparin Volume 30  Unmeasured Output  Stool Occurrence 1  Stool Characteristics  Bowel Incontinence Yes  Stool Type Type 6 (Mushy consistency with ragged edges)  Has the patient had three Type 7 stools in the last 24 hours? Yes - RN notified  Stool Descriptors Brown;Black  Stool Amount Large  Stool Source Rectum

## 2016-05-13 NOTE — Plan of Care (Signed)
Pt last BM was 05/08/2016  Bisacodyl suppository administered

## 2016-05-13 NOTE — Progress Notes (Signed)
[facility]  ROUNDING NOTE   Subjective:   UOP 475 (360)  Creatinine 2.65 (1.92)  Sundowning last night.   States he does not want long term dialysis.   Objective:  Vital signs in last 24 hours:  Temp:  [95.7 F (35.4 C)-98.8 F (37.1 C)] 98.2 F (36.8 C) (03/20 0900) Pulse Rate:  [72-96] 85 (03/20 1000) Resp:  [15-27] 26 (03/20 1000) BP: (115-143)/(64-87) 115/81 (03/20 1000) SpO2:  [90 %-99 %] 96 % (03/20 1000) FiO2 (%):  [40 %] 40 % (03/20 0800) Weight:  [104.6 kg (230 lb 9.6 oz)] 104.6 kg (230 lb 9.6 oz) (03/20 0446)  Weight change: 1.6 kg (3 lb 8.4 oz) Filed Weights   05/08/16 1405 05/12/16 0500 05/13/16 0446  Weight: 94.1 kg (207 lb 7.3 oz) 103 kg (227 lb 1.2 oz) 104.6 kg (230 lb 9.6 oz)    Intake/Output: I/O last 3 completed shifts: In: 2160.6 [P.O.:180; I.V.:1030.6; IV Piggyback:950] Out: 6144 [Urine:560; Other:723]   Intake/Output this shift:  Total I/O In: 570 [P.O.:240; I.V.:30; IV Piggyback:300] Out: 30 [Urine:30]  Physical Exam: General: Critically ill appearing   Head: Laceration on left side of forehead  Eyes: Anicteric  Neck: Supple, trachea midline  Lungs:  Bilateral rhonchi   Heart: S1S2    Abdomen:  Soft, nontender, bowel sounds present  Extremities: 1+ peripheral edema.  Neurologic: Awake, alert, will follow simple commands  Skin: No lesions  Access: Right femoral vascath    Basic Metabolic Panel:  Recent Labs Lab 05/08/16 1101 05/08/16 1800  05/11/16 0512  05/11/16 2148 05/12/16 0148 05/12/16 0539 05/12/16 1004 05/13/16 0459  NA 138  --   < > 132*  < > 134* 132* 132* 134* 133*  K 2.8* 4.0  < > 4.5  < > 4.4 4.3 4.3 4.1 4.2  CL 106  --   < > 93*  < > 100* 99* 99* 101 100*  CO2 23  --   < > 25  < > 26 26 25 25 26   GLUCOSE 119*  --   < > 231*  < > 118* 143* 126* 126* 129*  BUN 24*  --   < > 89*  < > 67* 61* 54* 55* 77*  CREATININE 1.80*  --   < > 3.19*  < > 2.24* 2.02* 1.84* 1.92* 2.65*  CALCIUM 7.9*  --   < >  8.0*  < > 8.2* 8.1* 8.2* 8.2* 7.7*  MG 1.3* 2.4  --  2.5*  --   --   --  2.0  --   --   PHOS  --  2.9  --   --   < > 4.2 3.8 3.6  3.5 3.9 4.9*  < > = values in this interval not displayed.  Liver Function Tests:  Recent Labs Lab 05/08/16 1101  05/11/16 2148 05/12/16 0148 05/12/16 0539 05/12/16 1004 05/13/16 0459  AST 43*  --   --   --   --   --   --   ALT 24  --   --   --   --   --   --   ALKPHOS 42  --   --   --   --   --   --   BILITOT 3.2*  --   --   --   --   --   --   PROT 5.9*  --   --   --   --   --   --  ALBUMIN 3.0*  < > 2.7* 2.7* 2.6* 2.5* 2.3*  < > = values in this interval not displayed. No results for input(s): LIPASE, AMYLASE in the last 168 hours. No results for input(s): AMMONIA in the last 168 hours.  CBC:  Recent Labs Lab 05/07/16 2006 05/08/16 4315  05/10/16 0352 05/11/16 0512 05/12/16 0539 05/12/16 1004 05/13/16 0459  WBC 8.8 2.8*  < > 10.2 12.8* 16.0* 17.1* 20.0*  NEUTROABS 8.0* 2.4  --   --   --   --   --   --   HGB 13.9 13.5  < > 11.9* 11.4* 11.8* 12.0* 11.7*  HCT 41.4 40.1  < > 34.3* 34.0* 35.1* 35.7* 34.4*  MCV 88.1 89.5  < > 89.6 88.5 88.2 88.5 88.1  PLT 111* 96*  < > 99* 103* 106* 104* 119*  < > = values in this interval not displayed.  Cardiac Enzymes:  Recent Labs Lab 05/10/16 0352 05/10/16 1236 05/11/16 0030 05/11/16 0827 05/11/16 1327  TROPONINI 0.45* 0.43* 0.55* 0.38* 0.42*    BNP: Invalid input(s): POCBNP  CBG:  Recent Labs Lab 05/12/16 1141 05/12/16 1804 05/12/16 2117 05/13/16 0738 05/13/16 1055  GLUCAP 118* 155* 127* 119* 125*    Microbiology: Results for orders placed or performed during the hospital encounter of 05/08/16  Culture, blood (routine x 2)     Status: None   Collection Time: 05/08/16  9:53 AM  Result Value Ref Range Status   Specimen Description BLOOD L ARM  Final   Special Requests BOTTLES DRAWN AEROBIC AND ANAEROBIC BCAV  Final   Culture NO GROWTH 5 DAYS  Final   Report Status 05/13/2016  FINAL  Final  Culture, blood (routine x 2)     Status: None   Collection Time: 05/08/16  9:53 AM  Result Value Ref Range Status   Specimen Description BLOOD R ARM  Final   Special Requests BOTTLES DRAWN AEROBIC AND ANAEROBIC BCLV  Final   Culture NO GROWTH 5 DAYS  Final   Report Status 05/13/2016 FINAL  Final  MRSA PCR Screening     Status: None   Collection Time: 05/08/16  2:31 PM  Result Value Ref Range Status   MRSA by PCR NEGATIVE NEGATIVE Final    Comment:        The GeneXpert MRSA Assay (FDA approved for NASAL specimens only), is one component of a comprehensive MRSA colonization surveillance program. It is not intended to diagnose MRSA infection nor to guide or monitor treatment for MRSA infections.   Urine culture     Status: None   Collection Time: 05/09/16 11:33 AM  Result Value Ref Range Status   Specimen Description URINE, RANDOM  Final   Special Requests NONE  Final   Culture   Final    NO GROWTH Performed at [facility], [address] [place], [place] [address]    Report Status 05/11/2016 FINAL  Final  Culture, expectorated sputum-assessment     Status: None   Collection Time: 05/10/16  3:53 AM  Result Value Ref Range Status   Specimen Description SPUTUM  Final   Special Requests NONE  Final   Sputum evaluation   Final    Sputum specimen not acceptable for testing.  Please recollect.   C/DALE HOPKINS AT 7619 05/10/16.PMH   Report Status 05/10/2016 FINAL  Final    Coagulation Studies: No results for input(s): LABPROT, INR in the last 72 hours.  Urinalysis: No results for input(s): COLORURINE, LABSPEC,  PHURINE, GLUCOSEU, HGBUR, BILIRUBINUR, KETONESUR, PROTEINUR, UROBILINOGEN, NITRITE, LEUKOCYTESUR in the last 72 hours.  Invalid input(s): APPERANCEUR    Imaging: No results found.   Medications:   . heparin 1,500 Units/hr (05/13/16 0700)   . atorvastatin  40 mg Oral q1800  . budesonide (PULMICORT) nebulizer solution  0.5 mg Nebulization  BID  . carvedilol  25 mg Oral BID WC  . cefTRIAXone (ROCEPHIN)  IV  2 g Intravenous Daily  . chlorhexidine  15 mL Mouth Rinse BID  . doxycycline  100 mg Oral Q12H  . famotidine  20 mg Oral QHS  . guaiFENesin  600 mg Oral BID  . haloperidol lactate  2.5 mg Intravenous Once  . insulin aspart  0-5 Units Subcutaneous QHS  . insulin aspart  0-9 Units Subcutaneous TID WC  . ipratropium-albuterol  3 mL Nebulization Q6H  . mouth rinse  15 mL Mouth Rinse q12n4p  . sodium chloride flush  3 mL Intravenous Q12H  . tamsulosin  0.4 mg Oral Daily   acetaminophen **OR** acetaminophen, bisacodyl, diltiazem, labetalol, lip balm, morphine injection, ondansetron **OR** ondansetron (ZOFRAN) IV, promethazine, traZODone  Assessment/ Plan:  76 y.o.white male with Atrial fibrillation, BPH, congestive heart failure, coronary artery disease, depression, diabetes mellitus type 2, hypertension, hyperlipidemia, history of malignant melanoma who was admitted to [facility] on 05/08/2016 for evaluation of fall.  Pt found to have extensive left sided pneumonia with sepsis, acute respiratory failure, acute renal failure.   1.  Acute renal failure with metabolic acidosis due to ATN from sepsis. (baseline Cr 1.0)  - Requiring renal replacement therapy until 3/19. Oliguric renal failure.  - Monitor daily for intermittent hemodialysis. No acute indication for dialysis.   2.  Acute respiratory failure due to left sided pneumonia: Noninvasive ventilation  3.  Sepsis: Concern for pneumonia, cultures negative. Off vasopressors. Empiric doxycycline and ceftriaxone.    LOS: [place], [place] 3/20/201811:29 AM

## 2016-05-13 NOTE — Progress Notes (Signed)
Patient Name: Todd Valentine Date of Encounter: 05/13/2016  Primary Cardiologist: [facility] Problem List     Principal Problem:   Sepsis [facility]) Active Problems:   Chronic atrial fibrillation ([name])   Aspiration pneumonia ([name])   Atrial fibrillation with RVR ([name])   Hypokalemia   Gastroenteritis   AKI (acute kidney injury) ([place])   Hypomagnesemia   Leukopenia   Coronary artery disease involving native coronary artery of native heart without angina pectoris   Essential hypertension   Demand ischemia ([place])     Subjective   Tolerated HFNC on 3/19. Back on BiPAP at night. No acute overnight events. CRRT stopped on 3/19. Renal function trending back up from 1.9-->2.65. Minimal UOP. WBC count trending up to 20,000 today. No chest pain. Heart rate remains well controlled off amiodarone gtt int he 80s bpm on Coreg.   Inpatient Medications    Scheduled Meds: . atorvastatin  40 mg Oral q1800  . budesonide (PULMICORT) nebulizer solution  0.5 mg Nebulization BID  . carvedilol  25 mg Oral BID WC  . cefTRIAXone (ROCEPHIN)  IV  2 g Intravenous Daily  . chlorhexidine  15 mL Mouth Rinse BID  . doxycycline (VIBRAMYCIN) IV  100 mg Intravenous Q12H  . famotidine  20 mg Oral QHS  . guaiFENesin  600 mg Oral BID  . haloperidol lactate  2.5 mg Intravenous Once  . insulin aspart  0-5 Units Subcutaneous QHS  . insulin aspart  0-9 Units Subcutaneous TID WC  . ipratropium-albuterol  3 mL Nebulization Q6H  . mouth rinse  15 mL Mouth Rinse q12n4p  . sodium chloride flush  3 mL Intravenous Q12H  . tamsulosin  0.4 mg Oral Daily   Continuous Infusions: . heparin 1,500 Units/hr (05/13/16 0700)   PRN Meds: acetaminophen **OR** acetaminophen, bisacodyl, diltiazem, labetalol, lip balm, morphine injection, ondansetron **OR** ondansetron (ZOFRAN) IV, promethazine, traZODone   Vital Signs    Vitals:   05/13/16 0500 05/13/16 0600 05/13/16 0700 05/13/16 0800  BP: 130/85 124/78 126/79     Pulse: 79 76 72   Resp: (!) 22 19 17    Temp: 98.4 F (36.9 C) 98.4 F (36.9 C) 98.2 F (36.8 C) 97.1 F (36.2 C)  TempSrc:    Axillary  SpO2: 98% 96% 99%   Weight:      Height:        Intake/Output Summary (Last 24 hours) at 05/13/16 0846 Last data filed at 05/13/16 0801  Gross per 24 hour  Intake          1193.03 ml  Output              602 ml  Net           591.03 ml   Filed Weights   05/08/16 1405 05/12/16 0500 05/13/16 0446  Weight: 207 lb 7.3 oz (94.1 kg) 227 lb 1.2 oz (103 kg) 230 lb 9.6 oz (104.6 kg)    Physical Exam    GEN: Well nourished, well developed, in no acute distress.  HEENT: Grossly normal.  Neck: Supple, no JVD, carotid bruits, or masses. Cardiac: Irregularly irregular, no murmurs, rubs, or gallops. No clubbing, cyanosis, edema.  Radials/DP/PT 2+ and equal bilaterally.  Respiratory:  Diminished breath sounds bilaterally with diffuse rhonchi. On BiPAP. GI: Soft, nontender, nondistended, BS + x 4. MS: no deformity or atrophy. Skin: warm and dry, no rash. Neuro:  Strength and sensation are intact. Psych: AAOx3.  Normal affect.  Labs  CBC  Recent Labs  05/12/16 1004 05/13/16 0459  WBC 17.1* 20.0*  HGB 12.0* 11.7*  HCT 35.7* 34.4*  MCV 88.5 88.1  PLT 104* 283*   Basic Metabolic Panel  Recent Labs  05/11/16 0512  05/12/16 0539 05/12/16 1004 05/13/16 0459  NA 132*  < > 132* 134* 133*  K 4.5  < > 4.3 4.1 4.2  CL 93*  < > 99* 101 100*  CO2 25  < > 25 25 26   GLUCOSE 231*  < > 126* 126* 129*  BUN 89*  < > 54* 55* 77*  CREATININE 3.19*  < > 1.84* 1.92* 2.65*  CALCIUM 8.0*  < > 8.2* 8.2* 7.7*  MG 2.5*  --  2.0  --   --   PHOS  --   < > 3.6  3.5 3.9 4.9*  < > = values in this interval not displayed. Liver Function Tests  Recent Labs  05/12/16 1004 05/13/16 0459  ALBUMIN 2.5* 2.3*   No results for input(s): LIPASE, AMYLASE in the last 72 hours. Cardiac Enzymes  Recent Labs  05/11/16 0030 05/11/16 0827 05/11/16 1327   TROPONINI 0.55* 0.38* 0.42*   BNP Invalid input(s): POCBNP D-Dimer No results for input(s): DDIMER in the last 72 hours. Hemoglobin A1C No results for input(s): HGBA1C in the last 72 hours. Fasting Lipid Panel No results for input(s): CHOL, HDL, LDLCALC, TRIG, CHOLHDL, LDLDIRECT in the last 72 hours. Thyroid Function Tests No results for input(s): TSH, T4TOTAL, T3FREE, THYROIDAB in the last 72 hours.  Invalid input(s): FREET3  Telemetry    Afib, 80s bpm - Personally Reviewed  ECG    n/a - Personally Reviewed  Radiology    No results found.  Cardiac Studies   TTE 05/08/16: Study Conclusions  - Left ventricle: The cavity size was mildly reduced. There was mild concentric hypertrophy. Systolic function was normal. The estimated ejection fraction was in the range of 50% to 55%. Regional wall motion abnormalities cannot be excluded. The study is not technically sufficient to allow evaluation of LV diastolic function. - Mitral valve: There was mild regurgitation. - Left atrium: The atrium was mildly dilated. - Right ventricle: The cavity size was mildly dilated. Wall thickness was normal. Systolic function was mildly reduced. - Pulmonary arteries: Systolic pressure could not be accurately estimated.  Impressions:  - Rate is atrial fibrillation with RVR. Challenging image quality  Patient Profile     76 y.o. male with history of chronic atrial fibrillation, last seen April 25 2016 In clinic at which time he was doing well, prior history chronic diastolic CHF, type 2 diabetes, three-vessel coronary disease by catheterization August 2015, presenting with chills, fever, malaise, shortness of breath consistent with sepsis 2/2 pneumonia complicated by atrial fibrillation with RVR heart rate up to 170 bpm.  Assessment & Plan    1. Acute respiratory failure with hypercapnia: -Likely multifactorial including sepsis 2/2 PNA and Afib with RVR -Wean BiPAP as  tolerated  2. Chronic Afib with RVR: -Heart rates well controlled in the 70s to 80s bpm -Off amiodarone infusion as of 3/19 with well controlled rates -Continue Coreg for rate control -If needed could use short-acting diltiazem  -Continue heparin gtt for now  -Given renal function trending upwards will need to evaluate for long term renal function +/- dialysis -If renal function continues to decline/he requires long term HD would prefer Coumadin over DOAC -CHADS2VASc at least 6 (CHF, HTN, age x 2, DM, vascular disease)  3. ARF: -  Declining off CRRT -Likely ATN 2/2 sepsis and hypotension -May need long term HD, will need to continue to evaluate renal function and UOP daily  4. Elevated troponin: -No angina -Likely supply demand ischemia 2/2 sepsis with PNA, Afib with RVR, hypotension, and ARF -Echo with preserved EF -Already on heparin gtt as above -Place on Lipitor in place of home simvastatin given possibly need of diltiazem for Afib/BP  5. HTN: -Controlled  -Continue current regimen   6. Sepsis: -Off pressors -ABX per IM  Signed, Christell Faith, PA-C [facility] Pager: [phone] 05/13/2016, 8:46 AM

## 2016-05-13 NOTE — Progress Notes (Signed)
[place] for electrolyte management   Pharmacy consulted for electrolyte management for 76 yo male admitted with PNA/Afib. Patient is currently off of CRRT (stopped 3/19)  Plan:  No replacement warranted at this time. Follow-up electrolytes ordered with am labs.   Allergies  Allergen Reactions  . Ciprofloxacin     Other reaction(s): Other (See Comments) Pt states he could hardly move  . Sitagliptin     Other reaction(s): Other (See Comments) Weakness    Patient Measurements: Height: 5\' 9"  (175.3 cm) Weight: 230 lb 9.6 oz (104.6 kg) IBW/kg (Calculated) : 70.7   Vital Signs: Temp: 98.2 F (36.8 C) (03/20 0900) Temp Source: Axillary (03/20 0800) BP: 115/81 (03/20 1000) Pulse Rate: 85 (03/20 1000) Intake/Output from previous day: 03/19 0701 - 03/20 0700 In: 1193 [P.O.:180; I.V.:463; IV Piggyback:550] Out: 620 [Urine:475] Intake/Output from this shift: Total I/O In: 570 [P.O.:240; I.V.:30; IV Piggyback:300] Out: 30 [Urine:30]  Labs:  Recent Labs  05/11/16 0512  05/12/16 0539 05/12/16 1004 05/13/16 0459  WBC 12.8*  --  16.0* 17.1* 20.0*  HGB 11.4*  --  11.8* 12.0* 11.7*  HCT 34.0*  --  35.1* 35.7* 34.4*  PLT 103*  --  106* 104* 119*  CREATININE 3.19*  < > 1.84* 1.92* 2.65*  MG 2.5*  --  2.0  --   --   PHOS  --   < > 3.6  3.5 3.9 4.9*  ALBUMIN  --   < > 2.6* 2.5* 2.3*  < > = values in this interval not displayed. Estimated Creatinine Clearance: 28.7 mL/min (A) (by C-G formula based on SCr of 2.65 mg/dL (H)).  Lab Results  Component Value Date   K 4.2 05/13/2016     Pharmacy will continue to monitor and adjust per consult.   Stuart Mirabile L 05/13/2016,1:41 PM

## 2016-05-14 DIAGNOSIS — J189 Pneumonia, unspecified organism: Secondary | ICD-10-CM

## 2016-05-14 DIAGNOSIS — J9601 Acute respiratory failure with hypoxia: Secondary | ICD-10-CM

## 2016-05-14 LAB — CBC
HCT: 36.2 % — ABNORMAL LOW (ref 40.0–52.0)
Hemoglobin: 12 g/dL — ABNORMAL LOW (ref 13.0–18.0)
MCH: 29.5 pg (ref 26.0–34.0)
MCHC: 33.3 g/dL (ref 32.0–36.0)
MCV: 88.6 fL (ref 80.0–100.0)
Platelets: 131 10*3/uL — ABNORMAL LOW (ref 150–440)
RBC: 4.08 MIL/uL — ABNORMAL LOW (ref 4.40–5.90)
RDW: 14.8 % — ABNORMAL HIGH (ref 11.5–14.5)
WBC: 23.6 10*3/uL — ABNORMAL HIGH (ref 3.8–10.6)

## 2016-05-14 LAB — RENAL FUNCTION PANEL
Albumin: 2.4 g/dL — ABNORMAL LOW (ref 3.5–5.0)
Anion gap: 8 (ref 5–15)
BUN: 83 mg/dL — ABNORMAL HIGH (ref 6–20)
CO2: 25 mmol/L (ref 22–32)
Calcium: 7.9 mg/dL — ABNORMAL LOW (ref 8.9–10.3)
Chloride: 102 mmol/L (ref 101–111)
Creatinine, Ser: 2.52 mg/dL — ABNORMAL HIGH (ref 0.61–1.24)
GFR calc Af Amer: 27 mL/min — ABNORMAL LOW (ref >60)
GFR calc non Af Amer: 23 mL/min — ABNORMAL LOW (ref >60)
Glucose, Bld: 116 mg/dL — ABNORMAL HIGH (ref 65–99)
Phosphorus: 4.8 mg/dL — ABNORMAL HIGH (ref 2.5–4.6)
Potassium: 4 mmol/L (ref 3.5–5.1)
Sodium: 135 mmol/L (ref 135–145)

## 2016-05-14 LAB — GLUCOSE, CAPILLARY
Glucose-Capillary: 97 mg/dL (ref 65–99)
Glucose-Capillary: 130 mg/dL — ABNORMAL HIGH (ref 65–99)
Glucose-Capillary: 136 mg/dL — ABNORMAL HIGH (ref 65–99)

## 2016-05-14 LAB — HEPARIN LEVEL (UNFRACTIONATED): Heparin Unfractionated: 0.68 IU/mL (ref 0.30–0.70)

## 2016-05-14 NOTE — Evaluation (Signed)
Physical Therapy Evaluation Patient Details Name: Todd Valentine MRN: [id number] DOB: 1940-09-19 Today's Date: 05/14/2016   History of Present Illness  Pt is a 76 yo male presented w/ increased weakness and a fall where he hit his head, pt experienced a-fib with rapid ventricular response and shown to have pneumonia, admitted to ICU and placed on Bipap and weaned to nasal cannula. During course of hospital stay patient expereinced ARF placed on CRRT and discontinued 05/12/16, currently has temp catheter placed in R femoral artery. PMH includes; A-fib, CHF, CAD, diabetes, HTN, HLD, and melanoma   Clinical Impression  Pt awake and willing to participate in PT evaluation, daughter was at bedside for entire session. Per patient he lives alone and is independent at baseline and does not use any current AD. UE and L LE appear WFLs for tasks assessed. Pt limited to supine therex due to R femoral cath placement. He was able to actively participate in exercises w/ cuing for safe technique, educated patient on importance of performing therex while in the hospital to maintain strength. Vitals remained stable throughout session. Overall patient is limited in mobility secondary to cardiopulmonary impairments and R femoral cath precautions, he will benefit from skilled PT to regain functional strength and return to his previous activity levels. Recommend patient transition to STR when medically appropriate.     Follow Up Recommendations SNF    Equipment Recommendations       Recommendations for Other Services       Precautions / Restrictions Precautions Precautions: Fall;Other (comment) (R femoral cath placed- no active/passive movements of R hip) Precaution Comments: R femoral cath- no active/passive movement of the R hip Restrictions Weight Bearing Restrictions: No      Mobility  Bed Mobility               General bed mobility comments: not formally assessed due to femoral cath precautions    Transfers                 General transfer comment: not assessed due to femoral cath precautions  Ambulation/Gait                Stairs            Wheelchair Mobility    Modified Rankin (Stroke Patients Only)       Balance Overall balance assessment: History of Falls;Needs assistance                                           Pertinent Vitals/Pain Pain Assessment: No/denies pain    Home Living Family/patient expects to be discharged to:: Private residence Living Arrangements: Alone Available Help at Discharge: Family;Available PRN/intermittently (has 6 daughters that live close by) Type of Home: House Home Access: Stairs to enter Entrance Stairs-Rails: Can reach both Entrance Stairs-Number of Steps: 5 Home Layout: One level Home Equipment: None      Prior Function Level of Independence: Independent         Comments: pt independent at baseline for ADLs and IADLs, does not use AD, uses CPAP at night but no supplemental O2 during the day     Hand Dominance        Extremity/Trunk Assessment   Upper Extremity Assessment Upper Extremity Assessment: Overall WFL for tasks assessed    Lower Extremity Assessment Lower Extremity Assessment: RLE deficits/detail;Overall [facility] for tasks assessed (L LE  grossly 4+/5) RLE Deficits / Details: R femoral perm cath placed  RLE: Unable to fully assess due to immobilization       Communication   Communication: No difficulties  Cognition Arousal/Alertness: Awake/alert Behavior During Therapy: WFL for tasks assessed/performed Overall Cognitive Status: Within Functional Limits for tasks assessed                      General Comments General comments (skin integrity, edema, etc.): balance not formally assessed due to femoral cath precautions    Exercises General Exercises - Upper Extremity Shoulder Flexion: AROM;Strengthening;Both;10 reps;Supine Shoulder ABduction:  AROM;Strengthening;Both;Supine Shoulder ADduction: Both;10 reps;Supine;Strengthening;AROM Shoulder Horizontal ABduction: AROM;Strengthening;Both;10 reps;Supine Shoulder Horizontal ADduction: AROM;Strengthening;Both;10 reps;Supine General Exercises - Lower Extremity Ankle Circles/Pumps: AROM;Strengthening;Both;10 reps;Supine Quad Sets: AROM;Left;10 reps;Supine;Strengthening Short Arc Quad: AROM;Strengthening;10 reps;Supine;Left Heel Slides: AROM;Strengthening;10 reps;Supine;Left Hip ABduction/ADduction: AROM;Strengthening;10 reps;Supine;Left   Assessment/Plan    PT Assessment Patient needs continued PT services  PT Problem List Decreased balance;Decreased activity tolerance;Decreased mobility;Cardiopulmonary status limiting activity       PT Treatment Interventions      PT Goals (Current goals can be found in the Care Plan section)  Acute Rehab PT Goals Patient Stated Goal: To get better and return home PT Goal Formulation: With patient Time For Goal Achievement: 05/28/16 Potential to Achieve Goals: Good    Frequency Min 2X/week   Barriers to discharge Decreased caregiver support      Co-evaluation               End of Session Equipment Utilized During Treatment: Oxygen Activity Tolerance: Patient tolerated treatment well Patient left: in bed;with bed alarm set;with call bell/phone within reach;with family/visitor present Nurse Communication: Mobility status PT Visit Diagnosis: Muscle weakness (generalized) (M62.81);History of falling (Z91.81)         Time: 4628-6381 PT Time Calculation (min) (ACUTE ONLY): 12 min   Charges:   PT Evaluation $PT Eval Moderate Complexity: 1 Procedure PT Treatments $Therapeutic Exercise: 8-22 mins   PT G Codes:         [employer] Student PT 05/14/2016, 1:19 PM

## 2016-05-14 NOTE — Progress Notes (Signed)
Pt has remained alert and oriented with no c/o pain. Pt has remained in NSR on cardiac monitor. Lung sounds with bilat rhonchi to upper lobes, diminished in the lower. Pt with a strong, non-productive cough. Pt has remained on 4LNC, SpO2 > 90%. Family has remained at bedside.

## 2016-05-14 NOTE — Progress Notes (Signed)
[place] for electrolyte management   Pharmacy consulted for electrolyte management for 76 yo male admitted with PNA/Afib. Patient is currently off of CRRT (stopped 3/19)  Plan:  No replacement warranted at this time. Follow-up electrolytes ordered with am labs.   Allergies  Allergen Reactions  . Ciprofloxacin     Other reaction(s): Other (See Comments) Pt states he could hardly move  . Sitagliptin     Other reaction(s): Other (See Comments) Weakness    Patient Measurements: Height: 5\' 9"  (175.3 cm) Weight: 230 lb 9.6 oz (104.6 kg) IBW/kg (Calculated) : 70.7   Vital Signs: BP: 145/98 (03/21 0900) Pulse Rate: 91 (03/21 0900) Intake/Output from previous day: 03/20 0701 - 03/21 0700 In: 1125 [P.O.:480; I.V.:345; IV Piggyback:300] Out: 1610 [Urine:1610] Intake/Output from this shift: Total I/O In: 6 [I.V.:6] Out: -   Labs:  Recent Labs  05/12/16 0539 05/12/16 1004 05/13/16 0459 05/14/16 0435  WBC 16.0* 17.1* 20.0* 23.6*  HGB 11.8* 12.0* 11.7* 12.0*  HCT 35.1* 35.7* 34.4* 36.2*  PLT 106* 104* 119* 131*  CREATININE 1.84* 1.92* 2.65* 2.52*  MG 2.0  --   --   --   PHOS 3.6  3.5 3.9 4.9* 4.8*  ALBUMIN 2.6* 2.5* 2.3* 2.4*   Estimated Creatinine Clearance: 30.2 mL/min (A) (by C-G formula based on SCr of 2.52 mg/dL (H)).  Lab Results  Component Value Date   K 4.0 05/14/2016     Pharmacy will continue to monitor and adjust per consult.   Ulice Dash D 05/14/2016,12:36 PM

## 2016-05-14 NOTE — Progress Notes (Signed)
Patient Name: Todd Valentine Date of Encounter: 05/14/2016  Primary Cardiologist: [facility] Problem List     Principal Problem:   Sepsis [facility]) Active Problems:   Chronic atrial fibrillation ([name])   Aspiration pneumonia ([name])   Atrial fibrillation with RVR ([name])   Hypokalemia   Gastroenteritis   AKI (acute kidney injury) ([place])   Hypomagnesemia   Leukopenia   Coronary artery disease involving native coronary artery of native heart without angina pectoris   Essential hypertension   Demand ischemia ([name])     Subjective   No acute overnight cardiac events. On BiPAP overnight again. Renal function slightly improved this AM to 2.52 from 2.65 the day prior BUN trending up at 83 from 77. K+ stable. UOP 1.6 L for the past 24 hours. Heart rate well controlled in the 80s bpm in Afib on Coreg. WBC count continues to trend upwards, last received Solu-Medrol on 3/19.   Inpatient Medications    Scheduled Meds: . atorvastatin  40 mg Oral q1800  . budesonide (PULMICORT) nebulizer solution  0.5 mg Nebulization BID  . carvedilol  25 mg Oral BID WC  . cefTRIAXone (ROCEPHIN)  IV  2 g Intravenous Daily  . chlorhexidine  15 mL Mouth Rinse BID  . doxycycline  100 mg Oral Q12H  . famotidine  20 mg Oral QHS  . guaiFENesin  600 mg Oral BID  . haloperidol lactate  2.5 mg Intravenous Once  . insulin aspart  0-5 Units Subcutaneous QHS  . insulin aspart  0-9 Units Subcutaneous TID WC  . ipratropium-albuterol  3 mL Nebulization Q6H  . mouth rinse  15 mL Mouth Rinse q12n4p  . sodium chloride flush  3 mL Intravenous Q12H  . tamsulosin  0.4 mg Oral Daily   Continuous Infusions: . heparin 1,500 Units/hr (05/13/16 1727)   PRN Meds: acetaminophen **OR** acetaminophen, bisacodyl, diltiazem, labetalol, lip balm, morphine injection, ondansetron **OR** ondansetron (ZOFRAN) IV, promethazine, traZODone   Vital Signs    Vitals:   05/13/16 2300 05/14/16 0000 05/14/16 0216 05/14/16 0400  BP:  (!) 145/80 132/76  133/76  Pulse: 77 79  78  Resp: (!) 27 (!) 22  20  Temp:      TempSrc:      SpO2: 92% 94% 96% 99%  Weight:      Height:        Intake/Output Summary (Last 24 hours) at 05/14/16 0718 Last data filed at 05/14/16 0600  Gross per 24 hour  Intake             1125 ml  Output             1610 ml  Net             -485 ml   Filed Weights   05/08/16 1405 05/12/16 0500 05/13/16 0446  Weight: 207 lb 7.3 oz (94.1 kg) 227 lb 1.2 oz (103 kg) 230 lb 9.6 oz (104.6 kg)    Physical Exam    GEN: Well nourished, well developed, in no acute distress.  HEENT: Grossly normal.  Neck: Supple, no JVD, carotid bruits, or masses. Cardiac: Irregularly irregular, no murmurs, rubs, or gallops. No clubbing, cyanosis, trace pre-tibial edema.  Radials/DP/PT 2+ and equal bilaterally.  Respiratory:  Diminished breath sounds bilaterally with diffuse rhonchi.  GI: Soft, nontender, nondistended, BS + x 4. MS: no deformity or atrophy. Skin: warm and dry, no rash. Neuro:  Strength and sensation are intact. Psych: AAOx3.  Normal affect.  Labs    CBC  Recent Labs  05/13/16 0459 05/14/16 0435  WBC 20.0* 23.6*  HGB 11.7* 12.0*  HCT 34.4* 36.2*  MCV 88.1 88.6  PLT 119* 638*   Basic Metabolic Panel  Recent Labs  05/12/16 0539  05/13/16 0459 05/14/16 0435  NA 132*  < > 133* 135  K 4.3  < > 4.2 4.0  CL 99*  < > 100* 102  CO2 25  < > 26 25  GLUCOSE 126*  < > 129* 116*  BUN 54*  < > 77* 83*  CREATININE 1.84*  < > 2.65* 2.52*  CALCIUM 8.2*  < > 7.7* 7.9*  MG 2.0  --   --   --   PHOS 3.6  3.5  < > 4.9* 4.8*  < > = values in this interval not displayed. Liver Function Tests  Recent Labs  05/13/16 0459 05/14/16 0435  ALBUMIN 2.3* 2.4*   No results for input(s): LIPASE, AMYLASE in the last 72 hours. Cardiac Enzymes  Recent Labs  05/11/16 0827 05/11/16 1327  TROPONINI 0.38* 0.42*   BNP Invalid input(s): POCBNP D-Dimer No results for input(s): DDIMER in the last 72  hours. Hemoglobin A1C No results for input(s): HGBA1C in the last 72 hours. Fasting Lipid Panel No results for input(s): CHOL, HDL, LDLCALC, TRIG, CHOLHDL, LDLDIRECT in the last 72 hours. Thyroid Function Tests No results for input(s): TSH, T4TOTAL, T3FREE, THYROIDAB in the last 72 hours.  Invalid input(s): FREET3  Telemetry    Afib, 80s bpm - Personally Reviewed  ECG    n/a - Personally Reviewed  Radiology    No results found.  Cardiac Studies   TTE 05/08/16: Study Conclusions  - Left ventricle: The cavity size was mildly reduced. There was mild concentric hypertrophy. Systolic function was normal. The estimated ejection fraction was in the range of 50% to 55%. Regional wall motion abnormalities cannot be excluded. The study is not technically sufficient to allow evaluation of LV diastolic function. - Mitral valve: There was mild regurgitation. - Left atrium: The atrium was mildly dilated. - Right ventricle: The cavity size was mildly dilated. Wall thickness was normal. Systolic function was mildly reduced. - Pulmonary arteries: Systolic pressure could not be accurately estimated.  Impressions:  - Rate is atrial fibrillation with RVR. Challenging image quality  Patient Profile     76 y.o. male with history of chronic atrial fibrillation, last seen April 25 2016 In clinic at which time he was doing well, prior history chronic diastolic CHF, type 2 diabetes, three-vessel coronary disease by catheterization August 2015, presenting with chills, fever, malaise, shortness of breath consistent with sepsis 2/2 pneumonia complicated byatrial fibrillation with RVR heart rate up to 170 bpm.   Assessment & Plan    1. Acute respiratory failure with hypercapnia: -Likely multifactorial including sepsis 2/2 PNA and Afib with RVR -Wean BiPAP as tolerated  2. Chronic Afib with RVR: -Heart rates well controlled in the 80s bpm -Off amiodarone infusion as of 3/19  with well controlled rates -Continue Coreg for rate control -If needed could use short-acting diltiazem  -Continue heparin gtt for now  -Given renal function will need to evaluate for long term renal function +/- dialysis (patient does not want long term dialysis) -Given renal function, may prefer Coumadin over DOAC -CHADS2VASc at least 6 (CHF, HTN, age x 2, DM, vascular disease)  3. ARF: -Off CRRT as of 3/19 -Renal function bumped off CRRT, slightly improved today -No acute indication for  HD at this time -Does not want long term dialysis -Likely ATN 2/2 sepsis and hypotension  4. Elevated troponin: -No angina -Likely supply demand ischemia 2/2 sepsis with PNA, Afib with RVR, hypotension, and ARF -Echo with preserved EF -Already on heparin gtt as above -Place on Lipitor in place of home simvastatin given possibly need of diltiazem for Afib/BP  5. HTN: -Controlled  -Continue current regimen   6. Sepsis: -Off pressors -ABX per IM   Signed, Christell Faith, PA-C [facility] Pager: [phone] 05/14/2016, 7:18 AM   Attending Note Patient seen and examined, agree with detailed note above,  Patient presentation and plan discussed on rounds.   BiPAP overnight, now on nasal cannula oxygen Otherwise feels well, improved breathing symptoms Blood pressure stable He is on heparin infusion  On physical exam coarse breath sounds bilaterally, no JVD 12+, heart sounds irregularly irregular, abdomen soft nontender, no significant lower extremity edema  Lab work reviewed showing stable renal function creatinine 2.5, hematocrit 36  --Atrial fibrillation with RVR Dramatically improved heart rate with improved respiratory status Currently on heparin infusion Candidate for eliquis 5 mg twice a day  ---Acute respiratory distress/pneumonia Dramatic improvement in the past several days Now on nasal cannula oxygen 7 L fluid positive He may benefit from gentle diuresis, Lasix 20  mg po  twice a day  ----Acute renal failure Stable, secondary to ATN  Long time spent with patient and family at the bedside discussing course of events as detailed above Greater than 50% was spent in counseling and coordination of care with patient Total encounter time 35 minutes or more   Signed: Esmond Plants  M.D., Ph.D. [facility]

## 2016-05-14 NOTE — Progress Notes (Signed)
ANTICOAGULATION CONSULT NOTE - Follow Up Consult  Pharmacy Consult for Heparin drip             Indication: atrial fibrillation       Allergies  Allergen Reactions  . Ciprofloxacin     Other reaction(s): Other (See Comments) Pt states he could hardly move  . Sitagliptin     Other reaction(s): Other (See Comments) Weakness    Patient Measurements: Height: 5\' 9"  (175.3 cm) Weight: 207 lb 7.3 oz (94.1 kg) IBW/kg (Calculated) : 70.7 Heparin Dosing Weight: 90 kg  Vital Signs: Temp: 97.9 F (36.6 C) (03/17 2013) Temp Source: Oral (03/17 2013) BP: 150/111 (03/17 1700) Pulse Rate: 107 (03/17 1700)  Labs:  HL= 0.32 (0.42)  CBC Latest Ref Rng & Units 05/14/2016 05/13/2016 05/12/2016  WBC 3.8 - 10.6 K/uL 23.6(H) 20.0(H) 17.1(H)  Hemoglobin 13.0 - 18.0 g/dL 12.0(L) 11.7(L) 12.0(L)  Hematocrit 40.0 - 52.0 % 36.2(L) 34.4(L) 35.7(L)  Platelets 150 - 440 K/uL 131(L) 119(L) 104(L)     Assessment: 76 yo male with PMH of A. Fib. Patient has been taking Dabigatran BID at home until he runs out of current prescription- then plan was for him to start apixaban 5mg  BID. Patient dose not know when he took his last dose of Dabigatran, but guesses it was probably on 3/13 at some point.   Goal of Therapy: Heparin level 0.3-0.7 units/ml aPTT 66-102seconds Monitor platelets by anticoagulation protocol: Yes  Plan: Heparin level at goal but at low end of goal and decreasing. Will increase heparin drip to 1500 unit/hr and recheck a HL in 8 hours.   3/19 22:00 heparin level 0.50. Continue current regimen. Recheck with tomorrow AM labs.  3/20 AM heparin level 0.40. Continue current regimen. Recheck heparin level and CBC with tomorrow AM labs.  3/21 AM heparin level 0.68. Continue current regimen. Recheck heparin level and CBC with tomorrow AM labs.   Sim Boast, PharmD, BCPS  05/14/16

## 2016-05-14 NOTE — Progress Notes (Signed)
[facility]  ROUNDING NOTE   Subjective:  Moved out of ICU.  Nonoliguric urine output.   Objective:  Vital signs in last 24 hours:  Temp:  [98.3 F (36.8 C)-98.8 F (37.1 C)] 98.3 F (36.8 C) (03/21 1648) Pulse Rate:  [75-91] 87 (03/21 1648) Resp:  [18-27] 20 (03/21 1648) BP: (128-148)/(76-98) 148/82 (03/21 1648) SpO2:  [87 %-99 %] 95 % (03/21 1648) FiO2 (%):  [40 %] 40 % (03/21 0216) Weight:  [106.7 kg (235 lb 3.2 oz)] 106.7 kg (235 lb 3.2 oz) (03/21 1648)  Weight change:  Filed Weights   05/12/16 0500 05/13/16 0446 05/14/16 1648  Weight: 103 kg (227 lb 1.2 oz) 104.6 kg (230 lb 9.6 oz) 106.7 kg (235 lb 3.2 oz)    Intake/Output: I/O last 3 completed shifts: In: 6720 [P.O.:480; I.V.:525; IV Piggyback:550] Out: 2020 [Urine:2020]   Intake/Output this shift:  Total I/O In: 794.8 [P.O.:580; I.V.:164.8; IV Piggyback:50] Out: 725 [Urine:725]  Physical Exam: General: Ill appearing   Head: Laceration on left side of forehead  Eyes: Anicteric  Neck: Supple, trachea midline  Lungs:  Bilateral rhonchi   Heart: S1S2    Abdomen:  Soft, nontender, bowel sounds present  Extremities: 1+ peripheral edema.  Neurologic: Awake, alert, will follow simple commands  Skin: No lesions  Access: Right femoral vascath    Basic Metabolic Panel:  Recent Labs Lab 05/08/16 1101 05/08/16 1800  05/11/16 0512  05/12/16 0148 05/12/16 0539 05/12/16 1004 05/13/16 0459 05/14/16 0435  NA 138  --   < > 132*  < > 132* 132* 134* 133* 135  K 2.8* 4.0  < > 4.5  < > 4.3 4.3 4.1 4.2 4.0  CL 106  --   < > 93*  < > 99* 99* 101 100* 102  CO2 23  --   < > 25  < > 26 25 25 26 25   GLUCOSE 119*  --   < > 231*  < > 143* 126* 126* 129* 116*  BUN 24*  --   < > 89*  < > 61* 54* 55* 77* 83*  CREATININE 1.80*  --   < > 3.19*  < > 2.02* 1.84* 1.92* 2.65* 2.52*  CALCIUM 7.9*  --   < > 8.0*  < > 8.1* 8.2* 8.2* 7.7* 7.9*  MG 1.3* 2.4  --  2.5*  --   --  2.0  --   --   --   PHOS  --  2.9  --   --    < > 3.8 3.6  3.5 3.9 4.9* 4.8*  < > = values in this interval not displayed.  Liver Function Tests:  Recent Labs Lab 05/08/16 1101  05/12/16 0148 05/12/16 0539 05/12/16 1004 05/13/16 0459 05/14/16 0435  AST 43*  --   --   --   --   --   --   ALT 24  --   --   --   --   --   --   ALKPHOS 42  --   --   --   --   --   --   BILITOT 3.2*  --   --   --   --   --   --   PROT 5.9*  --   --   --   --   --   --   ALBUMIN 3.0*  < > 2.7* 2.6* 2.5* 2.3* 2.4*  < > = values in this  interval not displayed. No results for input(s): LIPASE, AMYLASE in the last 168 hours. No results for input(s): AMMONIA in the last 168 hours.  CBC:  Recent Labs Lab 05/07/16 2006 05/08/16 1610  05/11/16 0512 05/12/16 0539 05/12/16 1004 05/13/16 0459 05/14/16 0435  WBC 8.8 2.8*  < > 12.8* 16.0* 17.1* 20.0* 23.6*  NEUTROABS 8.0* 2.4  --   --   --   --   --   --   HGB 13.9 13.5  < > 11.4* 11.8* 12.0* 11.7* 12.0*  HCT 41.4 40.1  < > 34.0* 35.1* 35.7* 34.4* 36.2*  MCV 88.1 89.5  < > 88.5 88.2 88.5 88.1 88.6  PLT 111* 96*  < > 103* 106* 104* 119* 131*  < > = values in this interval not displayed.  Cardiac Enzymes:  Recent Labs Lab 05/10/16 0352 05/10/16 1236 05/11/16 0030 05/11/16 0827 05/11/16 1327  TROPONINI 0.45* 0.43* 0.55* 0.38* 0.42*    BNP: Invalid input(s): POCBNP  CBG:  Recent Labs Lab 05/13/16 1055 05/13/16 1645 05/13/16 2115 05/14/16 0830 05/14/16 1221  GLUCAP 125* 111* 111* 97 130*    Microbiology: Results for orders placed or performed during the hospital encounter of 05/08/16  Culture, blood (routine x 2)     Status: None   Collection Time: 05/08/16  9:53 AM  Result Value Ref Range Status   Specimen Description BLOOD L ARM  Final   Special Requests BOTTLES DRAWN AEROBIC AND ANAEROBIC BCAV  Final   Culture NO GROWTH 5 DAYS  Final   Report Status 05/13/2016 FINAL  Final  Culture, blood (routine x 2)     Status: None   Collection Time: 05/08/16  9:53 AM  Result  Value Ref Range Status   Specimen Description BLOOD R ARM  Final   Special Requests BOTTLES DRAWN AEROBIC AND ANAEROBIC BCLV  Final   Culture NO GROWTH 5 DAYS  Final   Report Status 05/13/2016 FINAL  Final  MRSA PCR Screening     Status: None   Collection Time: 05/08/16  2:31 PM  Result Value Ref Range Status   MRSA by PCR NEGATIVE NEGATIVE Final    Comment:        The GeneXpert MRSA Assay (FDA approved for NASAL specimens only), is one component of a comprehensive MRSA colonization surveillance program. It is not intended to diagnose MRSA infection nor to guide or monitor treatment for MRSA infections.   Urine culture     Status: None   Collection Time: 05/09/16 11:33 AM  Result Value Ref Range Status   Specimen Description URINE, RANDOM  Final   Special Requests NONE  Final   Culture   Final    NO GROWTH Performed at [facility], [address], [place], [place] [address]    Report Status 05/11/2016 FINAL  Final  Culture, expectorated sputum-assessment     Status: None   Collection Time: 05/10/16  3:53 AM  Result Value Ref Range Status   Specimen Description SPUTUM  Final   Special Requests NONE  Final   Sputum evaluation   Final    Sputum specimen not acceptable for testing.  Please recollect.   C/DALE HOPKINS AT 4098 05/10/16.PMH   Report Status 05/10/2016 FINAL  Final    Coagulation Studies: No results for input(s): LABPROT, INR in the last 72 hours.  Urinalysis: No results for input(s): COLORURINE, LABSPEC, PHURINE, GLUCOSEU, HGBUR, BILIRUBINUR, KETONESUR, PROTEINUR, UROBILINOGEN, NITRITE, LEUKOCYTESUR in the last 72 hours.  Invalid input(s):  APPERANCEUR    Imaging: No results found.   Medications:   . heparin 1,500 Units/hr (05/14/16 0700)   . atorvastatin  40 mg Oral q1800  . budesonide (PULMICORT) nebulizer solution  0.5 mg Nebulization BID  . carvedilol  25 mg Oral BID WC  . cefTRIAXone (ROCEPHIN)  IV  2 g Intravenous Daily  . chlorhexidine   15 mL Mouth Rinse BID  . doxycycline  100 mg Oral Q12H  . famotidine  20 mg Oral QHS  . guaiFENesin  600 mg Oral BID  . haloperidol lactate  2.5 mg Intravenous Once  . insulin aspart  0-5 Units Subcutaneous QHS  . insulin aspart  0-9 Units Subcutaneous TID WC  . ipratropium-albuterol  3 mL Nebulization Q6H  . mouth rinse  15 mL Mouth Rinse q12n4p  . sodium chloride flush  3 mL Intravenous Q12H  . tamsulosin  0.4 mg Oral Daily   acetaminophen **OR** acetaminophen, bisacodyl, diltiazem, labetalol, lip balm, morphine injection, ondansetron **OR** ondansetron (ZOFRAN) IV, promethazine, traZODone  Assessment/ Plan:  76 y.o.white male with Atrial fibrillation, BPH, congestive heart failure, coronary artery disease, depression, diabetes mellitus type 2, hypertension, hyperlipidemia, history of malignant melanoma who was admitted to [facility] on 05/08/2016 for evaluation of fall.  Pt found to have extensive left sided pneumonia with sepsis, acute respiratory failure, acute renal failure.   1.  Acute renal failure with metabolic acidosis due to ATN from sepsis. (baseline Cr 1.0)  - Requiring renal replacement therapy until 3/19. Oliguric renal failure.  - Monitor daily for intermittent hemodialysis. No acute indication for dialysis.   2.  Acute respiratory failure due to left sided pneumonia: Noninvasive ventilation transitioned to BIPAP  3.  Sepsis: Concern for pneumonia, cultures negative. Off vasopressors. Empiric antibiotics.    LOS: [place], [place] 3/21/20185:41 PM

## 2016-05-14 NOTE — Progress Notes (Signed)
[facility] [place] NAME: Todd Valentine    MR#:  [id number]  DATE OF BIRTH:  12-Sep-1940  SUBJECTIVE:  CHIEF COMPLAINT:   Chief Complaint  Patient presents with  . Loss of Consciousness   The patient is 76 year old Caucasian male with past medical history significant for history of chronic atrial fibrillation, CHF, coronary artery disease, diabetes, hypertension, hyperlipidemia, melanoma, who presented to the hospital due to weakness and fall, nonproductive cough, few episodes of nausea and vomiting. Patient was brought to emergency room where he was found to be in A. fib, RVR, chest x-ray revealed left lower lobe pneumonia, he was noted to be leukopenic, had elevated lactic acid level and was admitted. He was noted to have acute kidney injury requiring CRRT,kidney function improved and he is producing with urine. He continues to have some dry cough, no significant sputum production.   Review of Systems  Constitutional: Negative for chills, fever and weight loss.  HENT: Negative for congestion.   Eyes: Negative for blurred vision and double vision.  Respiratory: Positive for cough and shortness of breath. Negative for sputum production and wheezing.   Cardiovascular: Positive for leg swelling. Negative for chest pain, palpitations, orthopnea and PND.  Gastrointestinal: Negative for abdominal pain, blood in stool, constipation, diarrhea, nausea and vomiting.  Genitourinary: Negative for dysuria, frequency, hematuria and urgency.  Musculoskeletal: Negative for falls.  Neurological: Negative for dizziness, tremors, focal weakness and headaches.  Endo/Heme/Allergies: Does not bruise/bleed easily.  Psychiatric/Behavioral: Negative for depression. The patient does not have insomnia.     VITAL SIGNS: Blood pressure (!) 147/95, pulse 82, temperature 98.8 F (37.1 C), temperature source Oral, resp. rate (!) 24, height 5\' 9"  (1.753 m),  weight 104.6 kg (230 lb 9.6 oz), SpO2 94 %.  PHYSICAL EXAMINATION:   GENERAL:  76 y.o.-year-old patient sitting in the bed with no acute distress, eating lunch  EYES: Pupils equal, round, reactive to light and accommodation. No scleral icterus. Extraocular muscles intact.  HEENT: Head atraumatic, normocephalic. Oropharynx and nasopharynx clear.  NECK:  Supple, no jugular venous distention. No thyroid enlargement, no tenderness.  LUNGS: Normal breath sounds bilaterally, no wheezing, rales,rhonchi , but bilateral anterior and posterior lung field crepitations. Intermittent use of accessory muscles of respiration.  CARDIOVASCULAR: S1, S2 normal. No murmurs, rubs, or gallops.  ABDOMEN: Soft, nontender, nondistended. Bowel sounds present. No organomegaly or mass.  EXTREMITIES: Trace lower extremity and pedal edema, no cyanosis, or clubbing.  NEUROLOGIC: Cranial nerves II through XII are intact. Muscle strength 5/5 in all extremities. Sensation intact. Gait not checked.  PSYCHIATRIC: The patient is alert and oriented x 3.  SKIN: No obvious rash, lesion, or ulcer.   ORDERS/RESULTS REVIEWED:   CBC  Recent Labs Lab 05/07/16 2006 05/08/16 4680  05/11/16 0512 05/12/16 0539 05/12/16 1004 05/13/16 0459 05/14/16 0435  WBC 8.8 2.8*  < > 12.8* 16.0* 17.1* 20.0* 23.6*  HGB 13.9 13.5  < > 11.4* 11.8* 12.0* 11.7* 12.0*  HCT 41.4 40.1  < > 34.0* 35.1* 35.7* 34.4* 36.2*  PLT 111* 96*  < > 103* 106* 104* 119* 131*  MCV 88.1 89.5  < > 88.5 88.2 88.5 88.1 88.6  MCH 29.6 30.1  < > 29.7 29.7 29.7 29.9 29.5  MCHC 33.6 33.6  < > 33.6 33.7 33.6 34.0 33.3  RDW 13.4 13.8  < > 14.3 14.9* 14.7* 14.4 14.8*  LYMPHSABS 0.4* 0.3*  --   --   --   --   --   --  MONOABS 0.4 0.2  --   --   --   --   --   --   EOSABS 0.0 0.0  --   --   --   --   --   --   BASOSABS 0.0 0.0  --   --   --   --   --   --   < > = values in this interval not  displayed. ------------------------------------------------------------------------------------------------------------------  Chemistries   Recent Labs Lab 05/08/16 1101 05/08/16 1800  05/11/16 0512  05/12/16 0148 05/12/16 0539 05/12/16 1004 05/13/16 0459 05/14/16 0435  NA 138  --   < > 132*  < > 132* 132* 134* 133* 135  K 2.8* 4.0  < > 4.5  < > 4.3 4.3 4.1 4.2 4.0  CL 106  --   < > 93*  < > 99* 99* 101 100* 102  CO2 23  --   < > 25  < > 26 25 25 26 25   GLUCOSE 119*  --   < > 231*  < > 143* 126* 126* 129* 116*  BUN 24*  --   < > 89*  < > 61* 54* 55* 77* 83*  CREATININE 1.80*  --   < > 3.19*  < > 2.02* 1.84* 1.92* 2.65* 2.52*  CALCIUM 7.9*  --   < > 8.0*  < > 8.1* 8.2* 8.2* 7.7* 7.9*  MG 1.3* 2.4  --  2.5*  --   --  2.0  --   --   --   AST 43*  --   --   --   --   --   --   --   --   --   ALT 24  --   --   --   --   --   --   --   --   --   ALKPHOS 42  --   --   --   --   --   --   --   --   --   BILITOT 3.2*  --   --   --   --   --   --   --   --   --   < > = values in this interval not displayed. ------------------------------------------------------------------------------------------------------------------ estimated creatinine clearance is 30.2 mL/min (A) (by C-G formula based on SCr of 2.52 mg/dL (H)). ------------------------------------------------------------------------------------------------------------------ No results for input(s): TSH, T4TOTAL, T3FREE, THYROIDAB in the last 72 hours.  Invalid input(s): FREET3  Cardiac Enzymes  Recent Labs Lab 05/11/16 0030 05/11/16 0827 05/11/16 1327  TROPONINI 0.55* 0.38* 0.42*   ------------------------------------------------------------------------------------------------------------------ Invalid input(s): POCBNP ---------------------------------------------------------------------------------------------------------------  RADIOLOGY: No results found.  EKG:  Orders placed or performed during the hospital  encounter of 05/08/16  . ED EKG  . ED EKG  . EKG 12-Lead  . EKG 12-Lead    ASSESSMENT AND PLAN:  Principal Problem:   Sepsis ([place]) Active Problems:   Chronic atrial fibrillation ([name])   Coronary artery disease involving native coronary artery of native heart without angina pectoris   Aspiration pneumonia ([name])   Atrial fibrillation with RVR ([name])   Hypokalemia   Gastroenteritis   AKI (acute kidney injury) ([place])   Hypomagnesemia   Leukopenia   Essential hypertension   Demand ischemia ([name])   Acute respiratory failure with hypoxia ([place])   Community acquired pneumonia of left lung  #1. Sepsis due to. Bilateral left more than the right pneumonia, blood cultures  are negative, continue Rocephin, doxycycline, sputum cultures are not available #2. Aspiration pneumonia, continue Rocephin, doxycycline, sputum cultures are not available #3. Acute  diastolic CHF, improved with CRRT, kidney function improved, urinary output. He 1600 cc over the past 24 hours, following closely, not on Lasix at present, discussed with nephrologist #4. Acute renal failure, improved on CRRT, urinary output has improved, following closely #5 chronic atrial fibrillation, heart rate is well controlled on Coreg #6. Hyponatremia, resolved, following closely #7. Leukocytosis, follow closely, blood cultures are negative, patient is afebrile, etiology is unclear at this time, patient is receiving antibiotic therapy #8. Respiratory failure with hypoxia due to bilateral pneumonia, acute diastolic CHF, continue oxygen therapy as needed, BiPAP at nighttime, appreciate pulmonology input.  Management plans discussed with the patient, family and they are in agreement.   DRUG ALLERGIES:  Allergies  Allergen Reactions  . Ciprofloxacin     Other reaction(s): Other (See Comments) Pt states he could hardly move  . Sitagliptin     Other reaction(s): Other (See Comments) Weakness    CODE STATUS:     Code Status Orders         Start     Ordered   05/09/16 1116  Limited resuscitation (code)  Continuous    Question Answer Comment  In the event of cardiac or respiratory ARREST: Initiate Code Blue, Call Rapid Response Yes   In the event of cardiac or respiratory ARREST: Perform CPR Yes   In the event of cardiac or respiratory ARREST: Perform Intubation/Mechanical Ventilation No   In the event of cardiac or respiratory ARREST: Use NIPPV/BiPAp only if indicated Yes   In the event of cardiac or respiratory ARREST: Administer ACLS medications if indicated Yes   In the event of cardiac or respiratory ARREST: Perform Defibrillation or Cardioversion if indicated Yes      05/09/16 1116    Code Status History    Date Active Date Inactive Code Status Order ID Comments User Context   05/09/2016  9:16 AM 05/09/2016 11:16 AM DNR [id number]  Hillary Bow, MD Inpatient   05/08/2016  2:18 PM 05/09/2016  9:16 AM Full Code [id number]  [name], MD Inpatient    Advance Directive Documentation     Most Recent Value  Type of Advance Directive  Healthcare Power of [place]  Pre-existing out of facility DNR order (yellow form or pink MOST form)  -  "MOST" Form in Place?  -      TOTAL TIME TAKING CARE OF THIS PATIENT: 40  minutes.  Discussed this patient's daughter  Theodoro Grist M.D on 05/14/2016 at 3:31 PM  Between 7am to 6pm - Pager - [phone]  After 6pm go to [email or web address] - password [email or web address] [facility]  Office  [phone]  CC: Primary care physician; [name], MD

## 2016-05-14 NOTE — Progress Notes (Signed)
  Speech Language Pathology Treatment: Dysphagia  Patient Details Name: SHOOTER TANGEN MRN: [id number] DOB: 20-Jan-1941 Today's Date: 05/14/2016 Time: 2353-6144 SLP Time Calculation (min) (ACUTE ONLY): 30 min  Assessment / Plan / Recommendation Clinical Impression  Patient able to drink 8 ounces of water and masticate/swallow peanut butter cracker.  The patient demonstrated one strong, immediate cough with initial swallow of thin liquid- the patient agreed that "it went the wrong way".  He had no more episodes of coughing directly related to swallowing.  The patient is coughing periodically due to current pneumonia.  The patient is now able to tolerate his upper dentures and denies oral sores.  He may need assistance for proper positioning (upright) and cutting foods.   HPI HPI: CXR suggestive of lLL pneumonia.  Bedside eval 05/12/16      SLP Plan  Continue with current plan of care       Recommendations  Diet recommendations: Regular;Thin liquid (Cut up meat, gravy with meat, no straw) Liquids provided via: Cup                Plan: Continue with current plan of care       GO                Rucker Pridgeon, Susie 05/14/2016, 1:17 PM

## 2016-05-15 ENCOUNTER — Inpatient Hospital Stay: Payer: Medicare Other

## 2016-05-15 LAB — RENAL FUNCTION PANEL
Albumin: 2.4 g/dL — ABNORMAL LOW (ref 3.5–5.0)
Anion gap: 7 (ref 5–15)
BUN: 75 mg/dL — ABNORMAL HIGH (ref 6–20)
CO2: 26 mmol/L (ref 22–32)
Calcium: 8.2 mg/dL — ABNORMAL LOW (ref 8.9–10.3)
Chloride: 104 mmol/L (ref 101–111)
Creatinine, Ser: 2.34 mg/dL — ABNORMAL HIGH (ref 0.61–1.24)
GFR calc Af Amer: 30 mL/min — ABNORMAL LOW (ref >60)
GFR calc non Af Amer: 26 mL/min — ABNORMAL LOW (ref >60)
Glucose, Bld: 109 mg/dL — ABNORMAL HIGH (ref 65–99)
Phosphorus: 5 mg/dL — ABNORMAL HIGH (ref 2.5–4.6)
Potassium: 4 mmol/L (ref 3.5–5.1)
Sodium: 137 mmol/L (ref 135–145)

## 2016-05-15 LAB — CBC
HCT: 36.1 % — ABNORMAL LOW (ref 40.0–52.0)
Hemoglobin: 11.9 g/dL — ABNORMAL LOW (ref 13.0–18.0)
MCH: 29.2 pg (ref 26.0–34.0)
MCHC: 32.9 g/dL (ref 32.0–36.0)
MCV: 88.9 fL (ref 80.0–100.0)
Platelets: 130 10*3/uL — ABNORMAL LOW (ref 150–440)
RBC: 4.07 MIL/uL — ABNORMAL LOW (ref 4.40–5.90)
RDW: 14.9 % — ABNORMAL HIGH (ref 11.5–14.5)
WBC: 22.7 10*3/uL — ABNORMAL HIGH (ref 3.8–10.6)

## 2016-05-15 LAB — GLUCOSE, CAPILLARY
Glucose-Capillary: 135 mg/dL — ABNORMAL HIGH (ref 65–99)
Glucose-Capillary: 105 mg/dL — ABNORMAL HIGH (ref 65–99)
Glucose-Capillary: 134 mg/dL — ABNORMAL HIGH (ref 65–99)
Glucose-Capillary: 158 mg/dL — ABNORMAL HIGH (ref 65–99)
Glucose-Capillary: 161 mg/dL — ABNORMAL HIGH (ref 65–99)

## 2016-05-15 LAB — HEPARIN LEVEL (UNFRACTIONATED): Heparin Unfractionated: 0.62 IU/mL (ref 0.30–0.70)

## 2016-05-15 MED ORDER — GUAIFENESIN 100 MG/5ML PO SOLN
20.0000 mL | Freq: Three times a day (TID) | ORAL | Status: DC
  Administered 2016-05-15 – 2016-05-19 (×13): 400 mg via ORAL
  Filled 2016-05-15 (×15): qty 20

## 2016-05-15 MED ORDER — AMLODIPINE BESYLATE 5 MG PO TABS
5.0000 mg | ORAL_TABLET | Freq: Two times a day (BID) | ORAL | Status: DC
  Administered 2016-05-15 – 2016-05-18 (×8): 5 mg via ORAL
  Filled 2016-05-15 (×8): qty 1

## 2016-05-15 NOTE — Progress Notes (Signed)
Patient Name: Todd Valentine Date of Encounter: 05/15/2016  Primary Cardiologist: [facility] Problem List     Principal Problem:   Sepsis [facility]) Active Problems:   Chronic atrial fibrillation ([name])   Aspiration pneumonia ([name])   Atrial fibrillation with RVR ([name])   Hypokalemia   Gastroenteritis   AKI (acute kidney injury) ([place])   Hypomagnesemia   Leukopenia   Coronary artery disease involving native coronary artery of native heart without angina pectoris   Essential hypertension   Demand ischemia ([place])   Acute respiratory failure with hypoxia ([place])   Community acquired pneumonia of left lung     Subjective   Transferred from ICU to 2A on 3/21. Remains on oxygen supplementation at 2 L. Heart rate remains well controlled in the 70s to 80s in Afib. Renal function improved this morning from 2.5-->2.3. Called his pastor overnight stating if he could not be discharged home he did not want to live. WBC slightly improved today. Net +6 L for the admission. Documented weight up 29 pounds for the admission.   Inpatient Medications    Scheduled Meds: . amLODipine  5 mg Oral BID  . atorvastatin  40 mg Oral q1800  . budesonide (PULMICORT) nebulizer solution  0.5 mg Nebulization BID  . carvedilol  25 mg Oral BID WC  . cefTRIAXone (ROCEPHIN)  IV  2 g Intravenous Daily  . chlorhexidine  15 mL Mouth Rinse BID  . doxycycline  100 mg Oral Q12H  . famotidine  20 mg Oral QHS  . guaiFENesin  600 mg Oral BID  . haloperidol lactate  2.5 mg Intravenous Once  . insulin aspart  0-5 Units Subcutaneous QHS  . insulin aspart  0-9 Units Subcutaneous TID WC  . ipratropium-albuterol  3 mL Nebulization Q6H  . mouth rinse  15 mL Mouth Rinse q12n4p  . sodium chloride flush  3 mL Intravenous Q12H  . tamsulosin  0.4 mg Oral Daily   Continuous Infusions: . heparin 1,500 Units/hr (05/15/16 0340)   PRN Meds: acetaminophen **OR** acetaminophen, bisacodyl, diltiazem, labetalol, lip balm,  morphine injection, ondansetron **OR** ondansetron (ZOFRAN) IV, promethazine, traZODone   Vital Signs    Vitals:   05/14/16 1952 05/15/16 0612 05/15/16 0731 05/15/16 0802  BP: (!) 149/70 (!) 194/95  (!) 167/78  Pulse: 76 69  68  Resp:    20  Temp: 98.1 F (36.7 C)   98.1 F (36.7 C)  TempSrc: Oral   Oral  SpO2: 99% 97% 93% 91%  Weight:  239 lb 9.6 oz (108.7 kg)    Height:        Intake/Output Summary (Last 24 hours) at 05/15/16 0909 Last data filed at 05/15/16 0340  Gross per 24 hour  Intake           914.75 ml  Output             1725 ml  Net          -810.25 ml   Filed Weights   05/13/16 0446 05/14/16 1648 05/15/16 0612  Weight: 230 lb 9.6 oz (104.6 kg) 235 lb 3.2 oz (106.7 kg) 239 lb 9.6 oz (108.7 kg)    Physical Exam    GEN: Well nourished, well developed, in no acute distress.  HEENT: Grossly normal. Scabs noted.  Neck: Supple, JVD difficult to assess 2/2 IJ, no carotid bruits, or masses. Cardiac: Irregularly irregular, no murmurs, rubs, or gallops. No clubbing, cyanosis, edema.  Radials/DP/PT 2+ and equal bilaterally.  Respiratory:  Diminished breath sounds bilaterally with diffuse rhonchi and wheezing. On 2 L via nasal cannula. GI: Soft, nontender, nondistended, BS + x 4. MS: no deformity or atrophy. Skin: warm and dry, no rash. Neuro:  Strength and sensation are intact. Psych: AAOx3.  Normal affect.  Labs    CBC  Recent Labs  05/14/16 0435 05/15/16 0332  WBC 23.6* 22.7*  HGB 12.0* 11.9*  HCT 36.2* 36.1*  MCV 88.6 88.9  PLT 131* 175*   Basic Metabolic Panel  Recent Labs  05/14/16 0435 05/15/16 0332  NA 135 137  K 4.0 4.0  CL 102 104  CO2 25 26  GLUCOSE 116* 109*  BUN 83* 75*  CREATININE 2.52* 2.34*  CALCIUM 7.9* 8.2*  PHOS 4.8* 5.0*   Liver Function Tests  Recent Labs  05/14/16 0435 05/15/16 0332  ALBUMIN 2.4* 2.4*   No results for input(s): LIPASE, AMYLASE in the last 72 hours. Cardiac Enzymes No results for input(s): CKTOTAL,  CKMB, CKMBINDEX, TROPONINI in the last 72 hours. BNP Invalid input(s): POCBNP D-Dimer No results for input(s): DDIMER in the last 72 hours. Hemoglobin A1C No results for input(s): HGBA1C in the last 72 hours. Fasting Lipid Panel No results for input(s): CHOL, HDL, LDLCALC, TRIG, CHOLHDL, LDLDIRECT in the last 72 hours. Thyroid Function Tests No results for input(s): TSH, T4TOTAL, T3FREE, THYROIDAB in the last 72 hours.  Invalid input(s): FREET3  Telemetry    Afib, 70s to 73s - Personally Reviewed  ECG    n/a - Personally Reviewed  Radiology    Dg Chest Port 1 View  Result Date: 05/15/2016 CLINICAL DATA:  Respiratory failure and hypoxia EXAM: PORTABLE CHEST 1 VIEW COMPARISON:  Chest radiograph 05/11/2016 FINDINGS: Unchanged enlarged cardiomediastinal silhouette with calcific aortic arch atherosclerosis. Airspace opacities in the left lung are unchanged. Persistent right basilar atelectasis. No pneumothorax or sizable pleural effusion. IMPRESSION: Cardiomegaly and unchanged left lung opacities. Electronically Signed   By: Ulyses Jarred M.D.   On: 05/15/2016 05:19    Cardiac Studies   TTE 05/08/16: Study Conclusions  - Left ventricle: The cavity size was mildly reduced. There was mild concentric hypertrophy. Systolic function was normal. The estimated ejection fraction was in the range of 50% to 55%. Regional wall motion abnormalities cannot be excluded. The study is not technically sufficient to allow evaluation of LV diastolic function. - Mitral valve: There was mild regurgitation. - Left atrium: The atrium was mildly dilated. - Right ventricle: The cavity size was mildly dilated. Wall thickness was normal. Systolic function was mildly reduced. - Pulmonary arteries: Systolic pressure could not be accurately estimated.  Impressions:  - Rate is atrial fibrillation with RVR. Challenging image quality  Patient Profile     76 y.o. male with history of  chronic atrial fibrillation, last seen April 25 2016 In clinic at which time he was doing well, prior history chronic diastolic CHF, type 2 diabetes, three-vessel coronary disease by catheterization August 2015, presenting with chills, fever, malaise, shortness of breath consistent with sepsis 2/2 pneumonia complicated byatrial fibrillation with RVR heart rate up to 170 bpm.  Assessment & Plan    1. Acute respiratory failure with hypercapnia: -Likely multifactorial including sepsis 2/2 PNA and Afib with RVR -Off BiPAP, on nasal cannula at 2 L  2. Chronic Afib with RVR: -Heart rates well controlled in the 80s bpm -Off amiodarone infusion as of 3/19 with well controlled rates -Continue Coreg for rate control -If needed could use short-acting diltiazem  -Continue heparin  gtt for now  -Given renal function will need to evaluate for long term renal function +/- dialysis (patient does not want long term dialysis) -Given renal function, may prefer Coumadin over DOAC -CHADS2VASc at least 6 (CHF, HTN, age x 2, DM, vascular disease)  3. ARF: -Off CRRT as of 3/19 -Renal function bumped off CRRT, continues to improved today -No acute indication for HD at this time -Does not want long term dialysis -Likely ATN 2/2 sepsis and hypotension  4. Elevated troponin: -No angina -Likely supply demand ischemia 2/2 sepsis with PNA, Afib with RVR, hypotension, and ARF -Echo with preserved EF -Already on heparin gtt as above -Place on Lipitor in place of home simvastatin given possibly need of diltiazem for Afib/BP  5. HTN: -Controlled  -Continue current regimen   6. Sepsis: -Off pressors -ABX per IM  7. Volume overload: -Positive 6 L for the admission, documented weight up 29 pounds for the admission -As renal function improves would benefit from gentle diuresis  8. Deconditioning: -Would benefit from sitting in the recliner today with legs elevated  -PT  Signed, Christell Faith, PA-C [facility] Pager: [phone] 05/15/2016, 9:09 AM

## 2016-05-15 NOTE — Progress Notes (Signed)
[facility]  ROUNDING NOTE   Subjective:   Daughter at bedside.  Patient complains of BIPAP mask  UOP 1725 Creatinine 2.34 (2.52)  Objective:  Vital signs in last 24 hours:  Temp:  [98.1 F (36.7 C)-98.3 F (36.8 C)] 98.1 F (36.7 C) (03/22 0802) Pulse Rate:  [68-87] 68 (03/22 0802) Resp:  [20-24] 20 (03/22 0802) BP: (148-194)/(70-95) 167/78 (03/22 0802) SpO2:  [91 %-99 %] 91 % (03/22 0802) FiO2 (%):  [40 %] 40 % (03/22 0123) Weight:  [106.7 kg (235 lb 3.2 oz)-108.7 kg (239 lb 9.6 oz)] 108.7 kg (239 lb 9.6 oz) (03/22 0612)  Weight change:  Filed Weights   05/13/16 0446 05/14/16 1648 05/15/16 0612  Weight: 104.6 kg (230 lb 9.6 oz) 106.7 kg (235 lb 3.2 oz) 108.7 kg (239 lb 9.6 oz)    Intake/Output: I/O last 3 completed shifts: In: 1140 [P.O.:580; I.V.:510; IV Piggyback:50] Out: 2825 [Urine:2825]   Intake/Output this shift:  Total I/O In: -  Out: 650 [Urine:650]  Physical Exam: General: Ill appearing   Head: Laceration on left side of forehead  Eyes: Anicteric  Neck: Supple, trachea midline  Lungs:  Bilateral rhonchi   Heart: S1S2    Abdomen:  Soft, nontender, bowel sounds present  Extremities: 1+ peripheral edema.  Neurologic: Awake, alert, will follow simple commands  Skin: No lesions  Access: Right femoral vascath    Basic Metabolic Panel:  Recent Labs Lab 05/08/16 1800  05/11/16 0512  05/12/16 0539 05/12/16 1004 05/13/16 0459 05/14/16 0435 05/15/16 0332  NA  --   < > 132*  < > 132* 134* 133* 135 137  K 4.0  < > 4.5  < > 4.3 4.1 4.2 4.0 4.0  CL  --   < > 93*  < > 99* 101 100* 102 104  CO2  --   < > 25  < > 25 25 26 25 26   GLUCOSE  --   < > 231*  < > 126* 126* 129* 116* 109*  BUN  --   < > 89*  < > 54* 55* 77* 83* 75*  CREATININE  --   < > 3.19*  < > 1.84* 1.92* 2.65* 2.52* 2.34*  CALCIUM  --   < > 8.0*  < > 8.2* 8.2* 7.7* 7.9* 8.2*  MG 2.4  --  2.5*  --  2.0  --   --   --   --   PHOS 2.9  --   --   < > 3.6  3.5 3.9 4.9* 4.8*  5.0*  < > = values in this interval not displayed.  Liver Function Tests:  Recent Labs Lab 05/12/16 0539 05/12/16 1004 05/13/16 0459 05/14/16 0435 05/15/16 0332  ALBUMIN 2.6* 2.5* 2.3* 2.4* 2.4*   No results for input(s): LIPASE, AMYLASE in the last 168 hours. No results for input(s): AMMONIA in the last 168 hours.  CBC:  Recent Labs Lab 05/12/16 0539 05/12/16 1004 05/13/16 0459 05/14/16 0435 05/15/16 0332  WBC 16.0* 17.1* 20.0* 23.6* 22.7*  HGB 11.8* 12.0* 11.7* 12.0* 11.9*  HCT 35.1* 35.7* 34.4* 36.2* 36.1*  MCV 88.2 88.5 88.1 88.6 88.9  PLT 106* 104* 119* 131* 130*    Cardiac Enzymes:  Recent Labs Lab 05/10/16 0352 05/10/16 1236 05/11/16 0030 05/11/16 0827 05/11/16 1327  TROPONINI 0.45* 0.43* 0.55* 0.38* 0.42*    BNP: Invalid input(s): POCBNP  CBG:  Recent Labs Lab 05/14/16 1221 05/14/16 1643 05/14/16 2109 05/15/16 0754 05/15/16 1155  GLUCAP 130* [place]    Microbiology: Results for orders placed or performed during the hospital encounter of 05/08/16  Culture, blood (routine x 2)     Status: None   Collection Time: 05/08/16  9:53 AM  Result Value Ref Range Status   Specimen Description BLOOD L ARM  Final   Special Requests BOTTLES DRAWN AEROBIC AND ANAEROBIC BCAV  Final   Culture NO GROWTH 5 DAYS  Final   Report Status 05/13/2016 FINAL  Final  Culture, blood (routine x 2)     Status: None   Collection Time: 05/08/16  9:53 AM  Result Value Ref Range Status   Specimen Description BLOOD R ARM  Final   Special Requests BOTTLES DRAWN AEROBIC AND ANAEROBIC BCLV  Final   Culture NO GROWTH 5 DAYS  Final   Report Status 05/13/2016 FINAL  Final  MRSA PCR Screening     Status: None   Collection Time: 05/08/16  2:31 PM  Result Value Ref Range Status   MRSA by PCR NEGATIVE NEGATIVE Final    Comment:        The GeneXpert MRSA Assay (FDA approved for NASAL specimens only), is one component of a comprehensive MRSA  colonization surveillance program. It is not intended to diagnose MRSA infection nor to guide or monitor treatment for MRSA infections.   Urine culture     Status: None   Collection Time: 05/09/16 11:33 AM  Result Value Ref Range Status   Specimen Description URINE, RANDOM  Final   Special Requests NONE  Final   Culture   Final    NO GROWTH Performed at [facility], [address], [place], [place] [address]    Report Status 05/11/2016 FINAL  Final  Culture, expectorated sputum-assessment     Status: None   Collection Time: 05/10/16  3:53 AM  Result Value Ref Range Status   Specimen Description SPUTUM  Final   Special Requests NONE  Final   Sputum evaluation   Final    Sputum specimen not acceptable for testing.  Please recollect.   C/DALE HOPKINS AT 7591 05/10/16.PMH   Report Status 05/10/2016 FINAL  Final    Coagulation Studies: No results for input(s): LABPROT, INR in the last 72 hours.  Urinalysis: No results for input(s): COLORURINE, LABSPEC, PHURINE, GLUCOSEU, HGBUR, BILIRUBINUR, KETONESUR, PROTEINUR, UROBILINOGEN, NITRITE, LEUKOCYTESUR in the last 72 hours.  Invalid input(s): APPERANCEUR    Imaging: Dg Chest Port 1 View  Result Date: 05/15/2016 CLINICAL DATA:  Respiratory failure and hypoxia EXAM: PORTABLE CHEST 1 VIEW COMPARISON:  Chest radiograph 05/11/2016 FINDINGS: Unchanged enlarged cardiomediastinal silhouette with calcific aortic arch atherosclerosis. Airspace opacities in the left lung are unchanged. Persistent right basilar atelectasis. No pneumothorax or sizable pleural effusion. IMPRESSION: Cardiomegaly and unchanged left lung opacities. Electronically Signed   By: Ulyses Jarred M.D.   On: 05/15/2016 05:19     Medications:   . heparin 1,500 Units/hr (05/15/16 0340)   . amLODipine  5 mg Oral BID  . atorvastatin  40 mg Oral q1800  . budesonide (PULMICORT) nebulizer solution  0.5 mg Nebulization BID  . carvedilol  25 mg Oral BID WC  .  cefTRIAXone (ROCEPHIN)  IV  2 g Intravenous Daily  . chlorhexidine  15 mL Mouth Rinse BID  . doxycycline  100 mg Oral Q12H  . famotidine  20 mg Oral QHS  . guaiFENesin  20 mL Oral TID  . haloperidol lactate  2.5 mg Intravenous Once  .  insulin aspart  0-5 Units Subcutaneous QHS  . insulin aspart  0-9 Units Subcutaneous TID WC  . ipratropium-albuterol  3 mL Nebulization Q6H  . mouth rinse  15 mL Mouth Rinse q12n4p  . sodium chloride flush  3 mL Intravenous Q12H  . tamsulosin  0.4 mg Oral Daily   acetaminophen **OR** acetaminophen, bisacodyl, diltiazem, labetalol, lip balm, morphine injection, ondansetron **OR** ondansetron (ZOFRAN) IV, promethazine, traZODone  Assessment/ Plan:  76 y.o.white male with Atrial fibrillation, BPH, congestive heart failure, coronary artery disease, depression, diabetes mellitus type 2, hypertension, hyperlipidemia, history of malignant melanoma who was admitted to [facility] on 05/08/2016 for evaluation of fall.  Pt found to have extensive left sided pneumonia with sepsis, acute respiratory failure, acute renal failure.   1.  Acute renal failure with metabolic acidosis due to ATN from sepsis. (baseline Cr 1.0)  Nonoliguric urine output.  - Requiring renal replacement therapy until 3/19. Oliguric renal failure.  - Monitor daily for intermittent hemodialysis. No acute indication for dialysis. No indication for diuretics.   2.  Acute respiratory failure due to left sided pneumonia: examined on nasal canula.   3.  Sepsis: Concern for pneumonia, cultures negative. Off vasopressors. Empiric antibiotics.    LOS: 7 Unique Sillas 3/22/201812:53 PM

## 2016-05-15 NOTE — Progress Notes (Signed)
[place] for electrolyte management   Pharmacy consulted for electrolyte management for 76 yo male admitted with PNA/Afib. Patient is currently off of CRRT (stopped 3/19)  Plan:  No replacement warranted at this time. Follow-up electrolytes ordered with am labs.   Allergies  Allergen Reactions  . Ciprofloxacin     Other reaction(s): Other (See Comments) Pt states he could hardly move  . Sitagliptin     Other reaction(s): Other (See Comments) Weakness    Patient Measurements: Height: 5\' 9"  (175.3 cm) Weight: 239 lb 9.6 oz (108.7 kg) IBW/kg (Calculated) : 70.7   Vital Signs: Temp: 97.8 F (36.6 C) (03/22 1318) Temp Source: Oral (03/22 1318) BP: 148/69 (03/22 1318) Pulse Rate: 89 (03/22 1318) Intake/Output from previous day: 03/21 0701 - 03/22 0700 In: 914.8 [P.O.:580; I.V.:284.8; IV Piggyback:50] Out: 1725 [Urine:1725] Intake/Output from this shift: Total I/O In: -  Out: 925 [Urine:925]  Labs:  Recent Labs  05/13/16 0459 05/14/16 0435 05/15/16 0332  WBC 20.0* 23.6* 22.7*  HGB 11.7* 12.0* 11.9*  HCT 34.4* 36.2* 36.1*  PLT 119* 131* 130*  CREATININE 2.65* 2.52* 2.34*  PHOS 4.9* 4.8* 5.0*  ALBUMIN 2.3* 2.4* 2.4*   Estimated Creatinine Clearance: 33.1 mL/min (A) (by C-G formula based on SCr of 2.34 mg/dL (H)).  Lab Results  Component Value Date   K 4.0 05/15/2016     Pharmacy will continue to monitor and adjust per consult.   Paulina Fusi, PharmD, BCPS 05/15/2016 3:01 PM

## 2016-05-15 NOTE — Progress Notes (Signed)
[facility] NAME: Todd Valentine    MR#:  [id number]  DATE OF BIRTH:  05-Jun-1940  SUBJECTIVE:  CHIEF COMPLAINT:   Chief Complaint  Patient presents with  . Loss of Consciousness   The patient is 76 year old Caucasian male with past medical history significant for history of chronic atrial fibrillation, CHF, coronary artery disease, diabetes, hypertension, hyperlipidemia, melanoma, who presented to the hospital due to weakness and fall, nonproductive cough, few episodes of nausea and vomiting. Patient was brought to emergency room where he was found to be in A. fib, RVR, chest x-ray revealed left lower lobe pneumonia, he was noted to be leukopenic, had elevated lactic acid level and was admitted. He was noted to have acute kidney injury requiring CRRT, kidney function improved and he is producing urine. He continues to have some dry cough, no significant sputum production. No significant shortness of breath, he feels that he is improving. Urine output is good at 1700 cc over the past 24 hours.   Review of Systems  Constitutional: Negative for chills, fever and weight loss.  HENT: Negative for congestion.   Eyes: Negative for blurred vision and double vision.  Respiratory: Positive for cough and shortness of breath. Negative for sputum production and wheezing.   Cardiovascular: Positive for leg swelling. Negative for chest pain, palpitations, orthopnea and PND.  Gastrointestinal: Negative for abdominal pain, blood in stool, constipation, diarrhea, nausea and vomiting.  Genitourinary: Negative for dysuria, frequency, hematuria and urgency.  Musculoskeletal: Negative for falls.  Neurological: Negative for dizziness, tremors, focal weakness and headaches.  Endo/Heme/Allergies: Does not bruise/bleed easily.  Psychiatric/Behavioral: Negative for depression. The patient does not have insomnia.     VITAL SIGNS: Blood pressure (!)  148/69, pulse 89, temperature 97.8 F (36.6 C), temperature source Oral, resp. rate 20, height 5\' 9"  (1.753 m), weight 108.7 kg (239 lb 9.6 oz), SpO2 95 %.  PHYSICAL EXAMINATION:   GENERAL:  76 y.o.-year-old patient sitting in the bed with no acute distress, eating lunch  EYES: Pupils equal, round, reactive to light and accommodation. No scleral icterus. Extraocular muscles intact.  HEENT: Head atraumatic, normocephalic. Oropharynx and nasopharynx clear. Dry scaled oral mucosa/lips NECK:  Supple, no jugular venous distention. No thyroid enlargement, no tenderness.  LUNGS: Normal breath sounds bilaterally, no wheezing, rales,rhonchi , just few scattered crepitations. Intermittent use of accessory muscles of respiration.  CARDIOVASCULAR: S1, S2 normal. No murmurs, rubs, or gallops.  ABDOMEN: Soft, nontender, nondistended. Bowel sounds present. No organomegaly or mass.  EXTREMITIES: Trace lower extremity and pedal edema, no cyanosis, or clubbing.  NEUROLOGIC: Cranial nerves II through XII are intact. Muscle strength 5/5 in all extremities. Sensation intact. Gait not checked.  PSYCHIATRIC: The patient is alert and oriented x 3.  SKIN: No obvious rash, lesion, or ulcer.   ORDERS/RESULTS REVIEWED:   CBC  Recent Labs Lab 05/12/16 0539 05/12/16 1004 05/13/16 0459 05/14/16 0435 05/15/16 0332  WBC 16.0* 17.1* 20.0* 23.6* 22.7*  HGB 11.8* 12.0* 11.7* 12.0* 11.9*  HCT 35.1* 35.7* 34.4* 36.2* 36.1*  PLT 106* 104* 119* 131* 130*  MCV 88.2 88.5 88.1 88.6 88.9  MCH 29.7 29.7 29.9 29.5 29.2  MCHC 33.7 33.6 34.0 33.3 32.9  RDW 14.9* 14.7* 14.4 14.8* 14.9*   ------------------------------------------------------------------------------------------------------------------  Chemistries   Recent Labs Lab 05/08/16 1800  05/11/16 0512  05/12/16 0539 05/12/16 1004 05/13/16 0459 05/14/16 0435 05/15/16 0332  NA  --   < > 132*  < >  132* 134* 133* 135 137  K 4.0  < > 4.5  < > 4.3 4.1 4.2 4.0  4.0  CL  --   < > 93*  < > 99* 101 100* 102 104  CO2  --   < > 25  < > 25 25 26 25 26   GLUCOSE  --   < > 231*  < > 126* 126* 129* 116* 109*  BUN  --   < > 89*  < > 54* 55* 77* 83* 75*  CREATININE  --   < > 3.19*  < > 1.84* 1.92* 2.65* 2.52* 2.34*  CALCIUM  --   < > 8.0*  < > 8.2* 8.2* 7.7* 7.9* 8.2*  MG 2.4  --  2.5*  --  2.0  --   --   --   --   < > = values in this interval not displayed. ------------------------------------------------------------------------------------------------------------------ estimated creatinine clearance is 33.1 mL/min (A) (by C-G formula based on SCr of 2.34 mg/dL (H)). ------------------------------------------------------------------------------------------------------------------ No results for input(s): TSH, T4TOTAL, T3FREE, THYROIDAB in the last 72 hours.  Invalid input(s): FREET3  Cardiac Enzymes  Recent Labs Lab 05/11/16 0030 05/11/16 0827 05/11/16 1327  TROPONINI 0.55* 0.38* 0.42*   ------------------------------------------------------------------------------------------------------------------ Invalid input(s): POCBNP ---------------------------------------------------------------------------------------------------------------  RADIOLOGY: Dg Chest Port 1 View  Result Date: 05/15/2016 CLINICAL DATA:  Respiratory failure and hypoxia EXAM: PORTABLE CHEST 1 VIEW COMPARISON:  Chest radiograph 05/11/2016 FINDINGS: Unchanged enlarged cardiomediastinal silhouette with calcific aortic arch atherosclerosis. Airspace opacities in the left lung are unchanged. Persistent right basilar atelectasis. No pneumothorax or sizable pleural effusion. IMPRESSION: Cardiomegaly and unchanged left lung opacities. Electronically Signed   By: Ulyses Jarred M.D.   On: 05/15/2016 05:19    EKG:  Orders placed or performed during the hospital encounter of 05/08/16  . ED EKG  . ED EKG  . EKG 12-Lead  . EKG 12-Lead    ASSESSMENT AND PLAN:  Principal Problem:    Sepsis ([facility]) Active Problems:   Chronic atrial fibrillation ([name])   Coronary artery disease involving native coronary artery of native heart without angina pectoris   Aspiration pneumonia ([name])   Atrial fibrillation with RVR ([name])   Hypokalemia   Gastroenteritis   AKI (acute kidney injury) ([facility])   Hypomagnesemia   Leukopenia   Essential hypertension   Demand ischemia ([name])   Acute respiratory failure with hypoxia ([facility])   Community acquired pneumonia of left lung  #1. Sepsis due to. Bilateral left more than the right pneumonia, blood cultures are negative, continue Rocephin, doxycycline, sputum cultures are not available, blood cultures and negative #2. Aspiration pneumonia, continue Rocephin, doxycycline, sputum cultures are not available, blood cultures and negative, clinically improving. Chest x-ray reveals bilateral pneumonitis #3. Acute  diastolic CHF, improved with CRRT, kidney function improved, urinary output was 1700 cc over the past 24 hours, following closely, not on Lasix at present, discussed with nephrologist today #4. Acute renal failure, improved on CRRT, urinary output has improved, following closely #5 chronic atrial fibrillation, heart rate is well controlled on Coreg #6. Hyponatremia, resolved, following closely #7. Leukocytosis, follow closely, blood cultures are negative, patient is afebrile, etiology is unclear at this time, patient is receiving antibiotic therapy or the patient, the patient's white blood cell count seemed to stabilize, could be related to inflammation in the lungs #8. Respiratory failure with hypoxia due to bilateral pneumonia, acute diastolic CHF, continue oxygen therapy as needed, now on 4 L, BiPAP at nighttime, now on 40% FiO2, appreciate  pulmonology input.  Management plans discussed with the patient, family and they are in agreement.   DRUG ALLERGIES:  Allergies  Allergen Reactions  . Ciprofloxacin     Other reaction(s): Other (See  Comments) Pt states he could hardly move  . Sitagliptin     Other reaction(s): Other (See Comments) Weakness    CODE STATUS:     Code Status Orders        Start     Ordered   05/09/16 1116  Limited resuscitation (code)  Continuous    Question Answer Comment  In the event of cardiac or respiratory ARREST: Initiate Code Blue, Call Rapid Response Yes   In the event of cardiac or respiratory ARREST: Perform CPR Yes   In the event of cardiac or respiratory ARREST: Perform Intubation/Mechanical Ventilation No   In the event of cardiac or respiratory ARREST: Use NIPPV/BiPAp only if indicated Yes   In the event of cardiac or respiratory ARREST: Administer ACLS medications if indicated Yes   In the event of cardiac or respiratory ARREST: Perform Defibrillation or Cardioversion if indicated Yes      05/09/16 1116    Code Status History    Date Active Date Inactive Code Status Order ID Comments User Context   05/09/2016  9:16 AM 05/09/2016 11:16 AM DNR [id number]  Hillary Bow, MD Inpatient   05/08/2016  2:18 PM 05/09/2016  9:16 AM Full Code [id number]  [name], MD Inpatient    Advance Directive Documentation     Most Recent Value  Type of Advance Directive  Healthcare Power of [place]  Pre-existing out of facility DNR order (yellow form or pink MOST form)  -  "MOST" Form in Place?  -      TOTAL TIME TAKING CARE OF THIS PATIENT: 40  minutes.  Discussed this patient's Step daughter  Theodoro Grist M.D on 05/15/2016 at 3:46 PM  Between 7am to 6pm - Pager - [phone]  After 6pm go to [email or web address] - password [email or web address] [facility]  Office  [phone]  CC: Primary care physician; [name], MD

## 2016-05-15 NOTE — Progress Notes (Signed)
Nutrition Follow-up  DOCUMENTATION CODES:   Obesity unspecified  INTERVENTION:  1. Encouraged patient to bring food from outside if patient prefers to improve PO intake 2. Continue Statistician Cup  NUTRITION DIAGNOSIS:   Inadequate oral intake related to acute illness, social / environmental circumstances as evidenced by meal completion < 25%, per patient/family report. -ongoing  GOAL:   Patient will meet greater than or equal to 90% of their needs -not meeting  MONITOR:   PO intake, Labs, Weight trends, Supplement acceptance  REASON FOR ASSESSMENT:   Consult Poor PO  ASSESSMENT:  No appetite, Had a sip of mighty shake today and some unsweetened tea. Lethargic and sleepy as well. Family going to get him [employer]  Labs and medications reviewed: CBGs 105-134, Phos 5 Heparin gtt  Diet Order:  DIET DYS 3 Room service appropriate? Yes with Assist; Fluid consistency: Thin  Skin:  Reviewed, no issues  Last BM:  05/08/16  Height:   Ht Readings from Last 1 Encounters:  05/08/16 5\' 9"  (1.753 m)    Weight:   Wt Readings from Last 1 Encounters:  05/15/16 239 lb 9.6 oz (108.7 kg)    Ideal Body Weight:     BMI:  Body mass index is 35.38 kg/m.  Estimated Nutritional Needs:   Kcal:  1900-2200 kcals  Protein:  95-110 g  Fluid:  >/= 1.8 L  EDUCATION NEEDS:   No education needs identified at this time  Satira Anis. Foy Mungia, MS, RD LDN Inpatient Clinical Dietitian Pager [phone]

## 2016-05-15 NOTE — Progress Notes (Signed)
Physical Therapy Treatment Patient Details Name: Todd Valentine MRN: [id number] DOB: April 30, 1940 Today's Date: 05/15/2016    History of Present Illness Pt is a 76 yo male presented w/ increased weakness and a fall where he hit his head, pt experienced a-fib with rapid ventricular response and shown to have pneumonia, admitted to ICU and placed on Bipap. During course of hospital stay patient expereinced ARF placed on CRRT and discontinued 05/12/16, currently has temp catheter placed in R femoral artery. PMH includes; A-fib, CHF, CAD, diabetes, HTN, HLD, and melanoma    PT Comments    Pt is making limited progress towards goals secondary to presence of R temp fem catheter. Pt able to perform there-ex while supine in bed with safe technique and resistance as able. Pt on 4L of O2 with sats at 91% with no SOB symptoms noted. Pt reports he is feeling stronger today and hopeful to transfer to recliner once fem cath removed.   Follow Up Recommendations  SNF     Equipment Recommendations       Recommendations for Other Services       Precautions / Restrictions Precautions Precautions: Fall;Other (comment) Precaution Comments: R femoral cath- no active/passive movement of the R hip Restrictions Weight Bearing Restrictions: No    Mobility  Bed Mobility               General bed mobility comments: not formally assessed due to femoral cath precautions   Transfers                    Ambulation/Gait                 Stairs            Wheelchair Mobility    Modified Rankin (Stroke Patients Only)       Balance                                    Cognition Arousal/Alertness: Awake/alert Behavior During Therapy: WFL for tasks assessed/performed Overall Cognitive Status: Within Functional Limits for tasks assessed                      Exercises Other Exercises Other Exercises: supine ther-ex performed x 12 reps including B  ankle pumps, quad sets, hip add squeezes, and resisted SAQ. L LE ther-ex including hip abd/add, SLRs, and resisted heel slides. Pt able to complete ther-ex with cga/min assist.    General Comments        Pertinent Vitals/Pain Pain Assessment: No/denies pain    Home Living                      Prior Function            PT Goals (current goals can now be found in the care plan section) Acute Rehab PT Goals Patient Stated Goal: To get better and return home PT Goal Formulation: With patient Time For Goal Achievement: 05/28/16 Potential to Achieve Goals: Good Progress towards PT goals: Progressing toward goals    Frequency    Min 2X/week      PT Plan Current plan remains appropriate    Co-evaluation             End of Session Equipment Utilized During Treatment: Oxygen Activity Tolerance: Patient tolerated treatment well Patient left: in bed;with bed alarm set;with call bell/phone within  reach;with family/visitor present Nurse Communication: Mobility status PT Visit Diagnosis: Muscle weakness (generalized) (M62.81);History of falling (Z91.81)     Time: 1441-1456 PT Time Calculation (min) (ACUTE ONLY): 15 min  Charges:  $Therapeutic Exercise: 8-22 mins                    G Codes:       Todd Valentine 06-11-16, 3:51 PM  Todd Valentine, PT, DPT [phone]

## 2016-05-15 NOTE — Progress Notes (Signed)
SLP Cancellation Note  Patient Details Name: RANDAL GOENS MRN: [id number] DOB: 15-Oct-1940   Cancelled treatment:       Reason Eval/Treat Not Completed: Fatigue/lethargy limiting ability to participate (discussed pt's status w/ Daughter; education).  Will f/u w/ pt and family w/ any further education on aspiration precautions, diet consistency as needed.    Orinda Kenner, MS, CCC-SLP Watson,Katherine 05/15/2016, 6:19 PM

## 2016-05-15 NOTE — Progress Notes (Signed)
Step daughter out at the desk multiple times to discuss step father's condition. Worried about step father's condition.

## 2016-05-15 NOTE — Progress Notes (Signed)
        Advance care planning     Present: Patient, [name] DR Ether Griffins The patient's stepdaughter, Ms. Todd Valentine  Diagnoses: Sepsis Bilateral pneumonitis Acute respiratory failure with hypoxia Acute diastolic CHF Acute renal failure Generalized weakness Hyponatremia Leukocytosis Thrombocytopenia   I discussed this patient and his stepdaughter the patient's medical problems, treatment plan, prognosis, we discussed CODE STATUS step by step, patient decided on remaining full code at this time.   Time spent 16-20 minutes

## 2016-05-15 NOTE — Progress Notes (Signed)
ANTICOAGULATION CONSULT NOTE - Follow Up Consult  Pharmacy Consult for Heparin drip             Indication: atrial fibrillation       Allergies  Allergen Reactions  . Ciprofloxacin     Other reaction(s): Other (See Comments) Pt states he could hardly move  . Sitagliptin     Other reaction(s): Other (See Comments) Weakness    Patient Measurements: Height: 5\' 9"  (175.3 cm) Weight: 207 lb 7.3 oz (94.1 kg) IBW/kg (Calculated) : 70.7 Heparin Dosing Weight: 90 kg  Vital Signs: Temp: 97.9 F (36.6 C) (03/17 2013) Temp Source: Oral (03/17 2013) BP: 150/111 (03/17 1700) Pulse Rate: 107 (03/17 1700)  Labs:  HL= 0.32 (0.42)  CBC Latest Ref Rng & Units 05/15/2016 05/14/2016 05/13/2016  WBC 3.8 - 10.6 K/uL 22.7(H) 23.6(H) 20.0(H)  Hemoglobin 13.0 - 18.0 g/dL 11.9(L) 12.0(L) 11.7(L)  Hematocrit 40.0 - 52.0 % 36.1(L) 36.2(L) 34.4(L)  Platelets 150 - 440 K/uL 130(L) 131(L) 119(L)     Assessment: 76 yo male with PMH of A. Fib. Patient has been taking Dabigatran BID at home until he runs out of current prescription- then plan was for him to start apixaban 5mg  BID. Patient dose not know when he took his last dose of Dabigatran, but guesses it was probably on 3/13 at some point.   Goal of Therapy: Heparin level 0.3-0.7 units/ml aPTT 66-102seconds Monitor platelets by anticoagulation protocol: Yes  Plan: Heparin level at goal but at low end of goal and decreasing. Will increase heparin drip to 1500 unit/hr and recheck a HL in 8 hours.   3/19 22:00 heparin level 0.50. Continue current regimen. Recheck with tomorrow AM labs.  3/20 AM heparin level 0.40. Continue current regimen. Recheck heparin level and CBC with tomorrow AM labs.  3/21 AM heparin level 0.68. Continue current regimen. Recheck heparin level and CBC with tomorrow AM labs.  3/22 AM heparin level 0.62. Continue current regimen. Recheck heparin level and CBC with tomorrow AM labs.    Sim Boast,  PharmD, BCPS  05/15/16

## 2016-05-16 LAB — DIFFERENTIAL
Band Neutrophils: 3 %
Basophils Absolute: 0 10*3/uL (ref 0–0.1)
Basophils Relative: 0 %
Blasts: 0 %
Eosinophils Absolute: 0 10*3/uL (ref 0–0.7)
Eosinophils Relative: 0 %
Lymphocytes Relative: 7 %
Lymphs Abs: 1.6 10*3/uL (ref 1.0–3.6)
Metamyelocytes Relative: 1 %
Monocytes Absolute: 1.1 10*3/uL — ABNORMAL HIGH (ref 0.2–1.0)
Monocytes Relative: 5 %
Myelocytes: 2 %
Neutro Abs: 20 10*3/uL — ABNORMAL HIGH (ref 1.4–6.5)
Neutrophils Relative %: 82 %
Other: 0 %
Promyelocytes Absolute: 0 %
nRBC: 0 /100 WBC

## 2016-05-16 LAB — RENAL FUNCTION PANEL
Albumin: 2.5 g/dL — ABNORMAL LOW (ref 3.5–5.0)
Anion gap: 6 (ref 5–15)
BUN: 68 mg/dL — ABNORMAL HIGH (ref 6–20)
CO2: 29 mmol/L (ref 22–32)
Calcium: 8.5 mg/dL — ABNORMAL LOW (ref 8.9–10.3)
Chloride: 106 mmol/L (ref 101–111)
Creatinine, Ser: 2.24 mg/dL — ABNORMAL HIGH (ref 0.61–1.24)
GFR calc Af Amer: 31 mL/min — ABNORMAL LOW (ref >60)
GFR calc non Af Amer: 27 mL/min — ABNORMAL LOW (ref >60)
Glucose, Bld: 157 mg/dL — ABNORMAL HIGH (ref 65–99)
Phosphorus: 4.5 mg/dL (ref 2.5–4.6)
Potassium: 4.1 mmol/L (ref 3.5–5.1)
Sodium: 141 mmol/L (ref 135–145)

## 2016-05-16 LAB — CBC
HCT: 35.4 % — ABNORMAL LOW (ref 40.0–52.0)
Hemoglobin: 11.7 g/dL — ABNORMAL LOW (ref 13.0–18.0)
MCH: 29.5 pg (ref 26.0–34.0)
MCHC: 33.2 g/dL (ref 32.0–36.0)
MCV: 88.9 fL (ref 80.0–100.0)
Platelets: 141 10*3/uL — ABNORMAL LOW (ref 150–440)
RBC: 3.98 MIL/uL — ABNORMAL LOW (ref 4.40–5.90)
RDW: 14.7 % — ABNORMAL HIGH (ref 11.5–14.5)
WBC: 22.7 10*3/uL — ABNORMAL HIGH (ref 3.8–10.6)

## 2016-05-16 LAB — GLUCOSE, CAPILLARY
Glucose-Capillary: 142 mg/dL — ABNORMAL HIGH (ref 65–99)
Glucose-Capillary: 155 mg/dL — ABNORMAL HIGH (ref 65–99)
Glucose-Capillary: 173 mg/dL — ABNORMAL HIGH (ref 65–99)
Glucose-Capillary: 146 mg/dL — ABNORMAL HIGH (ref 65–99)

## 2016-05-16 LAB — HEPARIN LEVEL (UNFRACTIONATED): Heparin Unfractionated: 0.65 IU/mL (ref 0.30–0.70)

## 2016-05-16 MED ORDER — FUROSEMIDE 40 MG PO TABS
40.0000 mg | ORAL_TABLET | Freq: Two times a day (BID) | ORAL | Status: DC
  Administered 2016-05-16 – 2016-05-19 (×7): 40 mg via ORAL
  Filled 2016-05-16 (×7): qty 1

## 2016-05-16 MED ORDER — LABETALOL HCL 5 MG/ML IV SOLN
10.0000 mg | Freq: Once | INTRAVENOUS | Status: AC
  Administered 2016-05-16: 10 mg via INTRAVENOUS
  Filled 2016-05-16: qty 4

## 2016-05-16 NOTE — Progress Notes (Signed)
ANTICOAGULATION CONSULT NOTE - Follow Up Consult  Pharmacy Consult for Heparin drip             Indication: atrial fibrillation       Allergies  Allergen Reactions  . Ciprofloxacin     Other reaction(s): Other (See Comments) Pt states he could hardly move  . Sitagliptin     Other reaction(s): Other (See Comments) Weakness    Patient Measurements: Height: 5\' 9"  (175.3 cm) Weight: 207 lb 7.3 oz (94.1 kg) IBW/kg (Calculated) : 70.7 Heparin Dosing Weight: 90 kg  Vital Signs: Temp: 97.9 F (36.6 C) (03/17 2013) Temp Source: Oral (03/17 2013) BP: 150/111 (03/17 1700) Pulse Rate: 107 (03/17 1700)  Labs:  HL= 0.32 (0.42)  CBC Latest Ref Rng & Units 05/16/2016 05/15/2016 05/14/2016  WBC 3.8 - 10.6 K/uL 22.7(H) 22.7(H) 23.6(H)  Hemoglobin 13.0 - 18.0 g/dL 11.7(L) 11.9(L) 12.0(L)  Hematocrit 40.0 - 52.0 % 35.4(L) 36.1(L) 36.2(L)  Platelets 150 - 440 K/uL 141(L) 130(L) 131(L)     Assessment: 76 yo male with PMH of A. Fib. Patient has been taking Dabigatran BID at home until he runs out of current prescription- then plan was for him to start apixaban 5mg  BID. Patient dose not know when he took his last dose of Dabigatran, but guesses it was probably on 3/13 at some point.   Goal of Therapy: Heparin level 0.3-0.7 units/ml aPTT 66-102seconds Monitor platelets by anticoagulation protocol: Yes  Plan: Heparin level at goal but at low end of goal and decreasing. Will increase heparin drip to 1500 unit/hr and recheck a HL in 8 hours.   3/19 22:00 heparin level 0.50. Continue current regimen. Recheck with tomorrow AM labs.  3/20 AM heparin level 0.40. Continue current regimen. Recheck heparin level and CBC with tomorrow AM labs.  3/21 AM heparin level 0.68. Continue current regimen. Recheck heparin level and CBC with tomorrow AM labs.  3/22 AM heparin level 0.62. Continue current regimen. Recheck heparin level and CBC with tomorrow AM labs.      3/23 AM heparin  level 0.65. Continue current regimen.     Recheck heparin level and CBC with tomorrow AM labs.    Sim Boast, PharmD, BCPS  05/16/16

## 2016-05-16 NOTE — Clinical Social Work Note (Signed)
Clinical Social Work Assessment  Patient Details  Name: Todd Valentine MRN: [id number] Date of Birth: 23-May-1940  Date of referral:  05/16/16               Reason for consult:  Facility Placement                Permission sought to share information with:  Facility Sport and exercise psychologist Permission granted to share information::  Yes, Verbal Permission Granted  Name::     Whitney Post Daughter [phone] or Fayrene Helper Daughter [phone]   Agency::  SNF admissions  Relationship::     Contact Information:     Housing/Transportation Living arrangements for the past 2 months:  Single Family Home Source of Information:  Adult Children Patient Interpreter Needed:  None Criminal Activity/Legal Involvement Pertinent to Current Situation/Hospitalization:  No - Comment as needed Significant Relationships:  Adult Children Lives with:  Adult Children Do you feel safe going back to the place where you live?  No Need for family participation in patient care:  Yes (Comment) (Patient's family helps with decision making.)  Care giving concerns:  Patient and family feel he needs some short term rehab before he is able to return back home.   Social Worker assessment / plan: Patient is a 76 year old male who lives with his daughter.  Patient is alert and oriented x4 and able to express his feelings.  Patient states he has not been to rehab before, CSW explained to him and his family what to expect at SNF for short term rehab and how insurance will pay for his stay.  Patient's family was explained role of CSW and what to expect at SNF once he is discharged to SNF.  Patient's family requested CSW begin bed search in [place] and [place].  Patient's family did not express any other questions or concerns.  Employment status:  Retired Forensic scientist:  Medicare PT Recommendations:  [place] / Referral to community resources:  [place]  Patient/Family's Response to care:  Patient and family in agreement to going to SNF for short term rehab.  Patient/Family's Understanding of and Emotional Response to Diagnosis, Current Treatment, and Prognosis: Patient expressed that he is motivated to work hard to get home as soon as he can.  Emotional Assessment Appearance:  Appears stated age Attitude/Demeanor/Rapport:    Affect (typically observed):  Appropriate, Calm, Pleasant Orientation:  Oriented to Self, Oriented to Place, Oriented to  Time, Oriented to Situation Alcohol / Substance use:  Not Applicable Psych involvement (Current and /or in the community):  No (Comment)  Discharge Needs  Concerns to be addressed:  Lack of Support Readmission within the last 30 days:  No Current discharge risk:  Lack of support system Barriers to Discharge:  Continued Medical Work up   Anell Barr 05/16/2016, 4:41 PM

## 2016-05-16 NOTE — NC FL2 (Signed)
[place] LEVEL OF CARE SCREENING TOOL     IDENTIFICATION  Patient Name: Todd Valentine Birthdate: 1940-10-14 Sex: male Admission Date (Current Location): 05/08/2016  [place] and [place] Number:  Engineering geologist and Address:  [facility], [address], [place], [place] [address]      Provider Number: [id number]  Attending Physician Name and Address:  Theodoro Grist, MD  Relative Name and Phone Number:       Current Level of Care: Hospital Recommended Level of Care: [place] Prior Approval Number:    Date Approved/Denied:   PASRR Number: [id number]  Discharge Plan: SNF    Current Diagnoses: Patient Active Problem List   Diagnosis Date Noted  . Acute respiratory failure with hypoxia ([place])   . Community acquired pneumonia of left lung   . Essential hypertension   . Demand ischemia ([place])   . Sepsis ([place]) 05/08/2016  . Aspiration pneumonia ([place]) 05/08/2016  . Atrial fibrillation with RVR ([place]) 05/08/2016  . Hypokalemia 05/08/2016  . Gastroenteritis 05/08/2016  . AKI (acute kidney injury) ([place]) 05/08/2016  . Hypomagnesemia 05/08/2016  . Leukopenia 05/08/2016  . Chronic atrial fibrillation ([place]) 01/02/2014  . Acute on chronic diastolic CHF (congestive heart failure) ([place]) 01/02/2014  . Morbid obesity ([place]) 01/02/2014  . Coronary artery disease involving native coronary artery of native heart without angina pectoris 01/02/2014  . Bilateral leg edema 01/02/2014  . Diabetes mellitus type 2 with complications ([place]) 35/36/1443  . History of smoking 30 or more pack years 01/02/2014  . Shortness of breath 01/02/2014  . Hyperlipidemia 01/02/2014    Orientation RESPIRATION BLADDER Height & Weight     Self, Time, Situation, Place  O2 (4L) Continent Weight: 238 lb 12.1 oz (108.3 kg) Height:  5\' 9"  (175.3 cm)  BEHAVIORAL SYMPTOMS/MOOD NEUROLOGICAL BOWEL NUTRITION STATUS      Incontinent Diet (Dysphagia 3)   AMBULATORY STATUS COMMUNICATION OF NEEDS Skin   Limited Assist Verbally Normal                       Personal Care Assistance Level of Assistance  Bathing, Feeding, Dressing Bathing Assistance: Limited assistance Feeding assistance: Limited assistance Dressing Assistance: Limited assistance     Functional Limitations Info  Sight, Hearing, Speech Sight Info: Adequate Hearing Info: Adequate Speech Info: Adequate    SPECIAL CARE FACTORS FREQUENCY  PT (By licensed PT)     PT Frequency: 5x a week              Contractures Contractures Info: Not present    Additional Factors Info  Code Status, Allergies, Psychotropic, Insulin Sliding Scale Code Status Info: Partial Code Allergies Info: CIPROFLOXACIN, SITAGLIPTIN Psychotropic Info: haloperidol lactate (HALDOL) injection 2.5 mg Insulin Sliding Scale Info: insulin aspart (novoLOG) injection 0-9 Units 3x a day with meals       Current Medications (05/16/2016):  This is the current hospital active medication list Current Facility-Administered Medications  Medication Dose Route Frequency Provider Last Rate Last Dose  . acetaminophen (TYLENOL) tablet 650 mg  650 mg Oral Q6H PRN [name], MD   650 mg at 05/15/16 1005   Or  . acetaminophen (TYLENOL) suppository 650 mg  650 mg Rectal Q6H PRN [name], MD      . amLODipine (NORVASC) tablet 5 mg  5 mg Oral BID Theodoro Grist, MD   5 mg at 05/16/16 0948  . atorvastatin (LIPITOR) tablet 40 mg  40 mg  Oral q1800 Areta Haber Dunn, PA-C   40 mg at 05/15/16 1648  . bisacodyl (DULCOLAX) suppository 10 mg  10 mg Rectal Daily PRN Laverle Hobby, MD   10 mg at 05/13/16 0824  . budesonide (PULMICORT) nebulizer solution 0.5 mg  0.5 mg Nebulization BID [name], MD   0.5 mg at 05/16/16 6226  . carvedilol (COREG) tablet 25 mg  25 mg Oral BID WC Wellington Hampshire, MD   25 mg at 05/16/16 0809  . cefTRIAXone (ROCEPHIN) 2 g in dextrose 5 % 50 mL IVPB  2 g Intravenous Daily  Laverle Hobby, MD   2 g at 05/16/16 0948  . chlorhexidine (PERIDEX) 0.12 % solution 15 mL  15 mL Mouth Rinse BID Hillary Bow, MD   15 mL at 05/16/16 0947  . diltiazem (CARDIZEM) tablet 30 mg  30 mg Oral Q8H PRN Areta Haber Dunn, PA-C   30 mg at 05/15/16 0630  . doxycycline (VIBRA-TABS) tablet 100 mg  100 mg Oral Q12H Laverle Hobby, MD   100 mg at 05/16/16 0947  . famotidine (PEPCID) tablet 20 mg  20 mg Oral QHS Awilda Bill, NP   20 mg at 05/15/16 2230  . furosemide (LASIX) tablet 40 mg  40 mg Oral BID Lavonia Dana, MD   40 mg at 05/16/16 0951  . guaiFENesin (ROBITUSSIN) 100 MG/5ML solution 400 mg  20 mL Oral TID Dani Gobble Hallaji, RPH   400 mg at 05/16/16 0947  . haloperidol lactate (HALDOL) injection 2.5 mg  2.5 mg Intravenous Once Awilda Bill, NP   Stopped at 05/13/16 0130  . heparin ADULT infusion 100 units/mL (25000 units/251mL sodium chloride 0.45%)  1,500 Units/hr Intravenous Continuous Kayson Conforti, DO 15 mL/hr at 05/16/16 1435 1,500 Units/hr at 05/16/16 1435  . insulin aspart (novoLOG) injection 0-5 Units  0-5 Units Subcutaneous QHS [name], MD      . insulin aspart (novoLOG) injection 0-9 Units  0-9 Units Subcutaneous TID WC [name], MD   2 Units at 05/16/16 1219  . ipratropium-albuterol (DUONEB) 0.5-2.5 (3) MG/3ML nebulizer solution 3 mL  3 mL Nebulization Q6H Srikar Sudini, MD   3 mL at 05/16/16 1510  . labetalol (NORMODYNE,TRANDATE) injection 10 mg  10 mg Intravenous Q6H PRN Hillary Bow, MD   10 mg at 05/16/16 0501  . lip balm (BLISTEX) ointment   Topical PRN Laverle Hobby, MD   1 application at 33/35/45 0809  . MEDLINE mouth rinse  15 mL Mouth Rinse q12n4p Hillary Bow, MD   15 mL at 05/13/16 1717  . morphine 4 MG/ML injection 2-4 mg  2-4 mg Intravenous Q3H PRN Mikael Spray, NP   4 mg at 05/10/16 2139  . ondansetron (ZOFRAN) tablet 4 mg  4 mg Oral Q6H PRN [name], MD       Or  . ondansetron [facility]) injection 4 mg  4 mg  Intravenous Q6H PRN [name], MD   4 mg at 05/10/16 1914  . promethazine (PHENERGAN) injection 12.5 mg  12.5 mg Intravenous Q6H PRN Mikael Spray, NP   12.5 mg at 05/10/16 2056  . sodium chloride flush (NS) 0.9 % injection 3 mL  3 mL Intravenous Q12H [name], MD   3 mL at 05/16/16 0949  . tamsulosin (FLOMAX) capsule 0.4 mg  0.4 mg Oral Daily [name], MD   0.4 mg at 05/16/16 0949  . traZODone (DESYREL) tablet 50 mg  50  mg Oral QHS PRN [name], MD   50 mg at 05/15/16 2230     Discharge Medications: Please see discharge summary for a list of discharge medications.  Relevant Imaging Results:  Relevant Lab Results:   Additional Information SSN [id number]  Ross Ludwig, [place]

## 2016-05-16 NOTE — Progress Notes (Signed)
Pt assessed at bruised site on nose. No changes. Bipap mask repositioned. Will contimue to monitor pt.

## 2016-05-16 NOTE — Progress Notes (Signed)
PT Cancellation Note  Patient Details Name: Todd Valentine MRN: [id number] DOB: 11-Jun-1940   Cancelled Treatment:    Reason Eval/Treat Not Completed: Patient at procedure or test/unavailable Pt having temp femoral catheter pulled at 1615, would need 2 hours bed rest prior to PT seeing, will attempt at a later date as appropriate.   Kreg Shropshire, DPT 05/16/2016, 4:15 PM

## 2016-05-16 NOTE — Progress Notes (Signed)
Notified MD of Bp 189/83 after 10 mg IV labetalol. Orders received. Will continue to monitor and assess.

## 2016-05-16 NOTE — Progress Notes (Signed)
[place] for electrolyte management   Pharmacy consulted for electrolyte management for 76 yo male admitted with PNA/Afib. Patient is currently off of CRRT (stopped 3/19)  Plan:  No replacement warranted at this time. Follow-up electrolytes ordered with am labs.   Allergies  Allergen Reactions  . Ciprofloxacin     Other reaction(s): Other (See Comments) Pt states he could hardly move  . Sitagliptin     Other reaction(s): Other (See Comments) Weakness    Patient Measurements: Height: 5\' 9"  (175.3 cm) Weight: 238 lb 12.1 oz (108.3 kg) IBW/kg (Calculated) : 70.7   Vital Signs: Temp: 97.6 F (36.4 C) (03/23 1133) Temp Source: Oral (03/23 1133) BP: 165/80 (03/23 1133) Pulse Rate: 77 (03/23 1133) Intake/Output from previous day: 03/22 0701 - 03/23 0700 In: 120 [I.V.:120] Out: 1725 [Urine:1725] Intake/Output from this shift: Total I/O In: 123 [P.O.:120; I.V.:3] Out: 650 [Urine:650]  Labs:  Recent Labs  05/14/16 0435 05/15/16 0332 05/16/16 0432  WBC 23.6* 22.7* 22.7*  HGB 12.0* 11.9* 11.7*  HCT 36.2* 36.1* 35.4*  PLT 131* 130* 141*  CREATININE 2.52* 2.34* 2.24*  PHOS 4.8* 5.0* 4.5  ALBUMIN 2.4* 2.4* 2.5*   Estimated Creatinine Clearance: 34.5 mL/min (A) (by C-G formula based on SCr of 2.24 mg/dL (H)).  Lab Results  Component Value Date   K 4.1 05/16/2016     Pharmacy will continue to monitor and adjust per consult.   Paulina Fusi, PharmD, BCPS 05/16/2016 1:49 PM

## 2016-05-16 NOTE — Progress Notes (Addendum)
Pts family was expressing to RT concerns about the bruising on patients nose. RN placed barrier cream to site and covered with tegaderm. Rt placed gel padded sheild also for extra protection. Pt placed on BIPAP. Will continue to monitor site while pt on BIPAP.

## 2016-05-16 NOTE — Progress Notes (Signed)
Patient's dialysis catheter was removed per MD order and hospital protocol. Pressure was held for thirty minutes and hemostasis achieved.  Occlusive dressing was placed over the right femoral groin site. Patient tolerated removal well. Patient and daughter educated on four hours of bedrest and situp time. Will continue to monitor.

## 2016-05-16 NOTE — Progress Notes (Signed)
[place] [facility] [place] NAME: Todd Valentine    MR#:  [id number]  DATE OF BIRTH:  1940-09-10  SUBJECTIVE:  CHIEF COMPLAINT:   Chief Complaint  Patient presents with  . Loss of Consciousness   The patient is 76 year old Caucasian male with past medical history significant for history of chronic atrial fibrillation, CHF, coronary artery disease, diabetes, hypertension, hyperlipidemia, melanoma, who presented to the hospital due to weakness and fall, nonproductive cough, few episodes of nausea and vomiting. Patient was brought to emergency room where he was found to be in A. fib, RVR, chest x-ray revealed left lower lobe pneumonia, he was noted to be leukopenic, had elevated lactic acid level and was admitted. He was noted to have acute kidney injury requiring CRRT, kidney function improved and he is producing urine. He continues to have some dry cough, no significant sputum production. No significant shortness of breath, he feels that he is improving. Urine output is good at 1700 cc over the past 24 hours.Nephrologist, Dr. Juleen China felt the patient would benefit from Lasix   Review of Systems  Constitutional: Negative for chills, fever and weight loss.  HENT: Negative for congestion.   Eyes: Negative for blurred vision and double vision.  Respiratory: Positive for cough and shortness of breath. Negative for sputum production and wheezing.   Cardiovascular: Positive for leg swelling. Negative for chest pain, palpitations, orthopnea and PND.  Gastrointestinal: Negative for abdominal pain, blood in stool, constipation, diarrhea, nausea and vomiting.  Genitourinary: Negative for dysuria, frequency, hematuria and urgency.  Musculoskeletal: Negative for falls.  Neurological: Negative for dizziness, tremors, focal weakness and headaches.  Endo/Heme/Allergies: Does not bruise/bleed easily.  Psychiatric/Behavioral: Negative for depression. The  patient does not have insomnia.     VITAL SIGNS: Blood pressure (!) 165/80, pulse 77, temperature 97.6 F (36.4 C), temperature source Oral, resp. rate 20, height 5\' 9"  (1.753 m), weight 108.3 kg (238 lb 12.1 oz), SpO2 93 %.  PHYSICAL EXAMINATION:   GENERAL:  76 y.o.-year-old patient sitting in the bed with no acute distress, eating lunch  EYES: Pupils equal, round, reactive to light and accommodation. No scleral icterus. Extraocular muscles intact.  HEENT: Head atraumatic, normocephalic. Oropharynx and nasopharynx clear. Dry scaled oral mucosa/lips NECK:  Supple, no jugular venous distention. No thyroid enlargement, no tenderness.  LUNGS: Normal breath sounds bilaterally, no wheezing, rales,rhonchi , diffusely scattered crepitations. Intermittent use of accessory muscles of respiration. Intermittent cough with rattling in the chest CARDIOVASCULAR: S1, S2 normal. No murmurs, rubs, or gallops.  ABDOMEN: Soft, nontender, nondistended. Bowel sounds present. No organomegaly or mass.  EXTREMITIES: Trace lower extremity and pedal edema, no cyanosis, or clubbing.  NEUROLOGIC: Cranial nerves II through XII are intact. Muscle strength 5/5 in all extremities. Sensation intact. Gait not checked.  PSYCHIATRIC: The patient is alert and oriented x 3.  SKIN: No obvious rash, lesion, or ulcer.   ORDERS/RESULTS REVIEWED:   CBC  Recent Labs Lab 05/12/16 1004 05/13/16 0459 05/14/16 0435 05/15/16 0332 05/16/16 0432  WBC 17.1* 20.0* 23.6* 22.7* 22.7*  HGB 12.0* 11.7* 12.0* 11.9* 11.7*  HCT 35.7* 34.4* 36.2* 36.1* 35.4*  PLT 104* 119* 131* 130* 141*  MCV 88.5 88.1 88.6 88.9 88.9  MCH 29.7 29.9 29.5 29.2 29.5  MCHC 33.6 34.0 33.3 32.9 33.2  RDW 14.7* 14.4 14.8* 14.9* 14.7*   ------------------------------------------------------------------------------------------------------------------  Chemistries   Recent Labs Lab 05/11/16 0512  05/12/16 0539 05/12/16 1004 05/13/16 0459 05/14/16 0435  05/15/16 0332 05/16/16 0432  NA 132*  < > 132* 134* 133* 135 137 141  K 4.5  < > 4.3 4.1 4.2 4.0 4.0 4.1  CL 93*  < > 99* 101 100* 102 104 106  CO2 25  < > 25 25 26 25 26 29   GLUCOSE 231*  < > 126* 126* 129* 116* 109* 157*  BUN 89*  < > 54* 55* 77* 83* 75* 68*  CREATININE 3.19*  < > 1.84* 1.92* 2.65* 2.52* 2.34* 2.24*  CALCIUM 8.0*  < > 8.2* 8.2* 7.7* 7.9* 8.2* 8.5*  MG 2.5*  --  2.0  --   --   --   --   --   < > = values in this interval not displayed. ------------------------------------------------------------------------------------------------------------------ estimated creatinine clearance is 34.5 mL/min (A) (by C-G formula based on SCr of 2.24 mg/dL (H)). ------------------------------------------------------------------------------------------------------------------ No results for input(s): TSH, T4TOTAL, T3FREE, THYROIDAB in the last 72 hours.  Invalid input(s): FREET3  Cardiac Enzymes  Recent Labs Lab 05/11/16 0030 05/11/16 0827 05/11/16 1327  TROPONINI 0.55* 0.38* 0.42*   ------------------------------------------------------------------------------------------------------------------ Invalid input(s): POCBNP ---------------------------------------------------------------------------------------------------------------  RADIOLOGY: Dg Chest Port 1 View  Result Date: 05/15/2016 CLINICAL DATA:  Respiratory failure and hypoxia EXAM: PORTABLE CHEST 1 VIEW COMPARISON:  Chest radiograph 05/11/2016 FINDINGS: Unchanged enlarged cardiomediastinal silhouette with calcific aortic arch atherosclerosis. Airspace opacities in the left lung are unchanged. Persistent right basilar atelectasis. No pneumothorax or sizable pleural effusion. IMPRESSION: Cardiomegaly and unchanged left lung opacities. Electronically Signed   By: Ulyses Jarred M.D.   On: 05/15/2016 05:19    EKG:  Orders placed or performed during the hospital encounter of 05/08/16  . ED EKG  . ED EKG  . EKG 12-Lead  .  EKG 12-Lead    ASSESSMENT AND PLAN:  Principal Problem:   Sepsis ([place]) Active Problems:   Chronic atrial fibrillation ([name])   Coronary artery disease involving native coronary artery of native heart without angina pectoris   Aspiration pneumonia ([name])   Atrial fibrillation with RVR ([name])   Hypokalemia   Gastroenteritis   AKI (acute kidney injury) ([place])   Hypomagnesemia   Leukopenia   Essential hypertension   Demand ischemia ([name])   Acute respiratory failure with hypoxia ([place])   Community acquired pneumonia of left lung  #1. Sepsis due to. Bilateral left more than the right pneumonia, blood cultures are negative, continue Rocephin, doxycycline, sputum cultures are not available, blood cultures are negative #2. Pneumonia, continue Rocephin, doxycycline, sputum cultures are not available, blood cultures and negative, clinically improving. Chest x-ray reveals bilateral pneumonitis, now on 4 L of oxygen through nasal cannula with good oxygen saturations #3. Acute  diastolic CHF, improved with CRRT, kidney function improved, urinary output was 1700 cc over the past 24 hours, following closely, not on Lasix at present, discussed with nephrologist today #4. Acute renal failure, improved on CRRT, urinary output has improved, lungs sound fluid overloaded today, nephrologist recommended to initiate patient on Lasix, following creatinine in the morning, urinary output #5 chronic atrial fibrillation, heart rate is well controlled on Coreg #6. Hyponatremia, resolved, following closely #7. Leukocytosis, white blood cell count remains very high,  blood cultures are negative, patient is afebrile, etiology is unclear at this time, patient is receiving antibiotic therapy or the patient, the patient's white blood cell count seemed to stabilize, could be related to inflammation in the lungs, getting differential, may need a hematology consultation #8. Respiratory failure with hypoxia due to bilateral  pneumonia, acute  diastolic CHF, continue oxygen therapy as needed, now on 4 L, BiPAP at nighttime, now on 40% FiO2, appreciate pulmonology input. Patient is full code, discussed with him yesterday #9  Thrombocytopenia, improving Management plans discussed with the patient, family and they are in agreement.   DRUG ALLERGIES:  Allergies  Allergen Reactions  . Ciprofloxacin     Other reaction(s): Other (See Comments) Pt states he could hardly move  . Sitagliptin     Other reaction(s): Other (See Comments) Weakness    CODE STATUS:     Code Status Orders        Start     Ordered   05/09/16 1116  Limited resuscitation (code)  Continuous    Question Answer Comment  In the event of cardiac or respiratory ARREST: Initiate Code Blue, Call Rapid Response Yes   In the event of cardiac or respiratory ARREST: Perform CPR Yes   In the event of cardiac or respiratory ARREST: Perform Intubation/Mechanical Ventilation No   In the event of cardiac or respiratory ARREST: Use NIPPV/BiPAp only if indicated Yes   In the event of cardiac or respiratory ARREST: Administer ACLS medications if indicated Yes   In the event of cardiac or respiratory ARREST: Perform Defibrillation or Cardioversion if indicated Yes      05/09/16 1116    Code Status History    Date Active Date Inactive Code Status Order ID Comments User Context   05/09/2016  9:16 AM 05/09/2016 11:16 AM DNR [id number]  Hillary Bow, MD Inpatient   05/08/2016  2:18 PM 05/09/2016  9:16 AM Full Code [id number]  [name], MD Inpatient    Advance Directive Documentation     Most Recent Value  Type of Advance Directive  Healthcare Power of [place]  Pre-existing out of facility DNR order (yellow form or pink MOST form)  -  "MOST" Form in Place?  -      TOTAL TIME TAKING CARE OF THIS PATIENT: 40  minutes.  Discussed this patient's daughter  Theodoro Grist M.D on 05/16/2016 at 3:36 PM  Between 7am to 6pm - Pager - [phone]  After  6pm go to [email or web address] - password [email or web address] [place] [facility]  Office  [phone]  CC: Primary care physician; [name], MD

## 2016-05-16 NOTE — Progress Notes (Signed)
While in patient room patient coughed and there was a delay when he went to hold his groin. Patient's groin started bleeding.  Patient was still on the heparin drip.  Pressure was held for approximately thirty minutes. Notified Dr. Juleen China and Dr. Jasper Riling.  Orders received to apply pressure closure device.  Orders received to discontinue heparin drip.Applied closure device with 1ml of air. Patient lying flat on bedrest.Will continue to monitor.

## 2016-05-16 NOTE — Clinical Social Work Note (Signed)
CSW spoke to patient's daughter Whitney Post, she is the [facility] and main contact [phone] and discussed SNF placement.  Patient's daughter is interested in having patient go to SNF, for short term rehab.  CSW explained to her what the process is and how insurance will pay for his stay.  CSW was given permission to begin bed search process in [facility] and [employer].  CSW to continue to follow patient's progress throughout discharge planning.  Jones Broom. Norval Morton, MSW, [place]  05/16/2016 4:17 PM

## 2016-05-16 NOTE — Clinical Social Work Placement (Addendum)
   CLINICAL SOCIAL WORK PLACEMENT  NOTE  Date:  05/16/2016  Patient Details  Name: Todd Valentine MRN: [id number] Date of Birth: January 26, 1941  Clinical Social Work is seeking post-discharge placement for this patient at the [place] level of care (*CSW will initial, date and re-position this form in  chart as items are completed):  Yes   Patient/family provided with [place] Work Department's list of facilities offering this level of care within the geographic area requested by the patient (or if unable, by the patient's family).  Yes   Patient/family informed of their freedom to choose among providers that offer the needed level of care, that participate in Medicare, Medicaid or managed care program needed by the patient, have an available bed and are willing to accept the patient.  Yes   Patient/family informed of [facility]'s ownership interest in [facility] and [facility], as well as of the fact that they are under no obligation to receive care at these facilities.  PASRR submitted to EDS on 05/16/16     PASRR number received on 05/16/16     Existing PASRR number confirmed on       FL2 transmitted to all facilities in geographic area requested by pt/family on 05/16/16     FL2 transmitted to all facilities within larger geographic area on       Patient informed that his/her managed care company has contracts with or will negotiate with certain facilities, including the following:         05-17-16   Patient/family informed of bed offers received.  (Updated 05-19-16 Evette Cristal, MSW, [place])  Patient chooses bed at  [facility]  (Updated 05-19-16 Evette Cristal, MSW, [facility])     Physician recommends and patient chooses bed at      Patient to be transferred to  [facility] on  05-19-16  (Updated 05-19-16 Evette Cristal, MSW, LCSWA).  Patient to be transferred to facility by  [facility]  (Updated 05-19-16 Evette Cristal, MSW, [place])     Patient family notified on   05-19-16 Name of family member notified:   Patient's daughter Butch Penny  (Updated 05-19-16 Evette Cristal, MSW, LCSWA)     PHYSICIAN Please sign FL2     Additional Comment:    _______________________________________________ Ross Ludwig, LCSWA 05/16/2016, 4:45 PM   (Updated 05-19-16 Evette Cristal, MSW, [place])

## 2016-05-16 NOTE — Progress Notes (Signed)
[facility]  ROUNDING NOTE   Subjective:   Daughter at bedside.   Objective:  Vital signs in last 24 hours:  Temp:  [97.6 F (36.4 C)-98.4 F (36.9 C)] 97.6 F (36.4 C) (03/23 1133) Pulse Rate:  [77-86] 77 (03/23 1133) Resp:  [16-28] 20 (03/23 1210) BP: (150-190)/(64-82) 165/80 (03/23 1133) SpO2:  [94 %-100 %] 94 % (03/23 1133) FiO2 (%):  [40 %] 40 % (03/23 0257) Weight:  [108.3 kg (238 lb 12.1 oz)] 108.3 kg (238 lb 12.1 oz) (03/23 0441)  Weight change: 1.614 kg (3 lb 8.9 oz) Filed Weights   05/14/16 1648 05/15/16 0612 05/16/16 0441  Weight: 106.7 kg (235 lb 3.2 oz) 108.7 kg (239 lb 9.6 oz) 108.3 kg (238 lb 12.1 oz)    Intake/Output: I/O last 3 completed shifts: In: 240 [I.V.:240] Out: 2725 [Urine:2725]   Intake/Output this shift:  Total I/O In: 123 [P.O.:120; I.V.:3] Out: 650 [Urine:650]  Physical Exam: General: Ill appearing   Head: Laceration on left side of forehead  Eyes: Anicteric  Neck: Supple, trachea midline  Lungs:  Bilateral rhonchi   Heart: S1S2    Abdomen:  Soft, nontender, bowel sounds present  Extremities: 1+ peripheral edema.  Neurologic: Awake, alert, will follow simple commands  Skin: No lesions  Access: Right femoral vascath    Basic Metabolic Panel:  Recent Labs Lab 05/11/16 0512  05/12/16 0539 05/12/16 1004 05/13/16 0459 05/14/16 0435 05/15/16 0332 05/16/16 0432  NA 132*  < > 132* 134* 133* 135 137 141  K 4.5  < > 4.3 4.1 4.2 4.0 4.0 4.1  CL 93*  < > 99* 101 100* 102 104 106  CO2 25  < > 25 25 26 25 26 29   GLUCOSE 231*  < > 126* 126* 129* 116* 109* 157*  BUN 89*  < > 54* 55* 77* 83* 75* 68*  CREATININE 3.19*  < > 1.84* 1.92* 2.65* 2.52* 2.34* 2.24*  CALCIUM 8.0*  < > 8.2* 8.2* 7.7* 7.9* 8.2* 8.5*  MG 2.5*  --  2.0  --   --   --   --   --   PHOS  --   < > 3.6  3.5 3.9 4.9* 4.8* 5.0* 4.5  < > = values in this interval not displayed.  Liver Function Tests:  Recent Labs Lab 05/12/16 1004 05/13/16 0459  05/14/16 0435 05/15/16 0332 05/16/16 0432  ALBUMIN 2.5* 2.3* 2.4* 2.4* 2.5*   No results for input(s): LIPASE, AMYLASE in the last 168 hours. No results for input(s): AMMONIA in the last 168 hours.  CBC:  Recent Labs Lab 05/12/16 1004 05/13/16 0459 05/14/16 0435 05/15/16 0332 05/16/16 0432  WBC 17.1* 20.0* 23.6* 22.7* 22.7*  HGB 12.0* 11.7* 12.0* 11.9* 11.7*  HCT 35.7* 34.4* 36.2* 36.1* 35.4*  MCV 88.5 88.1 88.6 88.9 88.9  PLT 104* 119* 131* 130* 141*    Cardiac Enzymes:  Recent Labs Lab 05/10/16 0352 05/10/16 1236 05/11/16 0030 05/11/16 0827 05/11/16 1327  TROPONINI 0.45* 0.43* 0.55* 0.38* 0.42*    BNP: Invalid input(s): POCBNP  CBG:  Recent Labs Lab 05/15/16 1155 05/15/16 1628 05/15/16 2135 05/16/16 0749 05/16/16 1155  GLUCAP 134* 158* 161* 142* 155*    Microbiology: Results for orders placed or performed during the hospital encounter of 05/08/16  Culture, blood (routine x 2)     Status: None   Collection Time: 05/08/16  9:53 AM  Result Value Ref Range Status   Specimen Description BLOOD L ARM  Final   Special Requests BOTTLES DRAWN AEROBIC AND ANAEROBIC BCAV  Final   Culture NO GROWTH 5 DAYS  Final   Report Status 05/13/2016 FINAL  Final  Culture, blood (routine x 2)     Status: None   Collection Time: 05/08/16  9:53 AM  Result Value Ref Range Status   Specimen Description BLOOD R ARM  Final   Special Requests BOTTLES DRAWN AEROBIC AND ANAEROBIC BCLV  Final   Culture NO GROWTH 5 DAYS  Final   Report Status 05/13/2016 FINAL  Final  MRSA PCR Screening     Status: None   Collection Time: 05/08/16  2:31 PM  Result Value Ref Range Status   MRSA by PCR NEGATIVE NEGATIVE Final    Comment:        The GeneXpert MRSA Assay (FDA approved for NASAL specimens only), is one component of a comprehensive MRSA colonization surveillance program. It is not intended to diagnose MRSA infection nor to guide or monitor treatment for MRSA infections.    Urine culture     Status: None   Collection Time: 05/09/16 11:33 AM  Result Value Ref Range Status   Specimen Description URINE, RANDOM  Final   Special Requests NONE  Final   Culture   Final    NO GROWTH Performed at [facility], [address] [place], [place] [address]    Report Status 05/11/2016 FINAL  Final  Culture, expectorated sputum-assessment     Status: None   Collection Time: 05/10/16  3:53 AM  Result Value Ref Range Status   Specimen Description SPUTUM  Final   Special Requests NONE  Final   Sputum evaluation   Final    Sputum specimen not acceptable for testing.  Please recollect.   C/DALE HOPKINS AT 8469 05/10/16.PMH   Report Status 05/10/2016 FINAL  Final    Coagulation Studies: No results for input(s): LABPROT, INR in the last 72 hours.  Urinalysis: No results for input(s): COLORURINE, LABSPEC, PHURINE, GLUCOSEU, HGBUR, BILIRUBINUR, KETONESUR, PROTEINUR, UROBILINOGEN, NITRITE, LEUKOCYTESUR in the last 72 hours.  Invalid input(s): APPERANCEUR    Imaging: Dg Chest Port 1 View  Result Date: 05/15/2016 CLINICAL DATA:  Respiratory failure and hypoxia EXAM: PORTABLE CHEST 1 VIEW COMPARISON:  Chest radiograph 05/11/2016 FINDINGS: Unchanged enlarged cardiomediastinal silhouette with calcific aortic arch atherosclerosis. Airspace opacities in the left lung are unchanged. Persistent right basilar atelectasis. No pneumothorax or sizable pleural effusion. IMPRESSION: Cardiomegaly and unchanged left lung opacities. Electronically Signed   By: Ulyses Jarred M.D.   On: 05/15/2016 05:19     Medications:   . heparin 1,500 Units/hr (05/16/16 1435)   . amLODipine  5 mg Oral BID  . atorvastatin  40 mg Oral q1800  . budesonide (PULMICORT) nebulizer solution  0.5 mg Nebulization BID  . carvedilol  25 mg Oral BID WC  . cefTRIAXone (ROCEPHIN)  IV  2 g Intravenous Daily  . chlorhexidine  15 mL Mouth Rinse BID  . doxycycline  100 mg Oral Q12H  . famotidine  20 mg Oral  QHS  . furosemide  40 mg Oral BID  . guaiFENesin  20 mL Oral TID  . haloperidol lactate  2.5 mg Intravenous Once  . insulin aspart  0-5 Units Subcutaneous QHS  . insulin aspart  0-9 Units Subcutaneous TID WC  . ipratropium-albuterol  3 mL Nebulization Q6H  . mouth rinse  15 mL Mouth Rinse q12n4p  . sodium chloride flush  3 mL Intravenous Q12H  .  tamsulosin  0.4 mg Oral Daily   acetaminophen **OR** acetaminophen, bisacodyl, diltiazem, labetalol, lip balm, morphine injection, ondansetron **OR** ondansetron (ZOFRAN) IV, promethazine, traZODone  Assessment/ Plan:  76 y.o.white male with Atrial fibrillation, BPH, congestive heart failure, coronary artery disease, depression, diabetes mellitus type 2, hypertension, hyperlipidemia, history of malignant melanoma who was admitted to [facility] on 05/08/2016 for evaluation of fall.  Pt found to have extensive left sided pneumonia with sepsis, acute respiratory failure, acute renal failure.   1.  Acute renal failure with metabolic acidosis due to ATN from sepsis. (baseline Cr 1.0)  Nonoliguric urine output.  - Requiring renal replacement therapy until 3/19. Oliguric renal failure.  - Monitor daily for intermittent hemodialysis. No acute indication for dialysis.  - Restart furosemide PO.   2.  Acute respiratory failure due to left sided pneumonia: examined on nasal canula.   3.  Sepsis: Concern for pneumonia, cultures negative. Off vasopressors. Empiric antibiotics.    LOS: [place], [place] 3/23/20183:03 PM

## 2016-05-16 NOTE — Care Management (Signed)
Patient transferred to 2A from icu.  Sepsis due to pneumonia and acute renal failure.  Required crrt but now will be monitored daily for need of hemodialysis.  At present, planning for skilled nursing placement. CSW is involved.

## 2016-05-17 ENCOUNTER — Inpatient Hospital Stay: Payer: Medicare Other

## 2016-05-17 LAB — RENAL FUNCTION PANEL
Albumin: 2.5 g/dL — ABNORMAL LOW (ref 3.5–5.0)
Anion gap: 7 (ref 5–15)
BUN: 54 mg/dL — ABNORMAL HIGH (ref 6–20)
CO2: 30 mmol/L (ref 22–32)
Calcium: 8.4 mg/dL — ABNORMAL LOW (ref 8.9–10.3)
Chloride: 104 mmol/L (ref 101–111)
Creatinine, Ser: 1.86 mg/dL — ABNORMAL HIGH (ref 0.61–1.24)
GFR calc Af Amer: 39 mL/min — ABNORMAL LOW (ref >60)
GFR calc non Af Amer: 34 mL/min — ABNORMAL LOW (ref >60)
Glucose, Bld: 147 mg/dL — ABNORMAL HIGH (ref 65–99)
Phosphorus: 3.8 mg/dL (ref 2.5–4.6)
Potassium: 3.5 mmol/L (ref 3.5–5.1)
Sodium: 141 mmol/L (ref 135–145)

## 2016-05-17 LAB — GLUCOSE, CAPILLARY
Glucose-Capillary: 143 mg/dL — ABNORMAL HIGH (ref 65–99)
Glucose-Capillary: 189 mg/dL — ABNORMAL HIGH (ref 65–99)
Glucose-Capillary: 209 mg/dL — ABNORMAL HIGH (ref 65–99)
Glucose-Capillary: 153 mg/dL — ABNORMAL HIGH (ref 65–99)

## 2016-05-17 LAB — HEPARIN LEVEL (UNFRACTIONATED): Heparin Unfractionated: 0.38 IU/mL (ref 0.30–0.70)

## 2016-05-17 LAB — CBC
HCT: 35.1 % — ABNORMAL LOW (ref 40.0–52.0)
Hemoglobin: 11.7 g/dL — ABNORMAL LOW (ref 13.0–18.0)
MCH: 29.5 pg (ref 26.0–34.0)
MCHC: 33.5 g/dL (ref 32.0–36.0)
MCV: 88 fL (ref 80.0–100.0)
Platelets: 130 10*3/uL — ABNORMAL LOW (ref 150–440)
RBC: 3.99 MIL/uL — ABNORMAL LOW (ref 4.40–5.90)
RDW: 14.8 % — ABNORMAL HIGH (ref 11.5–14.5)
WBC: 22.3 10*3/uL — ABNORMAL HIGH (ref 3.8–10.6)

## 2016-05-17 MED ORDER — DILTIAZEM HCL 30 MG PO TABS
30.0000 mg | ORAL_TABLET | Freq: Three times a day (TID) | ORAL | Status: DC
  Administered 2016-05-17 – 2016-05-18 (×3): 30 mg via ORAL
  Filled 2016-05-17 (×3): qty 1

## 2016-05-17 MED ORDER — IPRATROPIUM-ALBUTEROL 0.5-2.5 (3) MG/3ML IN SOLN
3.0000 mL | Freq: Three times a day (TID) | RESPIRATORY_TRACT | Status: DC
  Administered 2016-05-18 – 2016-05-19 (×5): 3 mL via RESPIRATORY_TRACT
  Filled 2016-05-17 (×5): qty 3

## 2016-05-17 MED ORDER — HEPARIN (PORCINE) IN NACL 100-0.45 UNIT/ML-% IJ SOLN
1300.0000 [IU]/h | INTRAMUSCULAR | Status: DC
  Administered 2016-05-17: 1400 [IU]/h via INTRAVENOUS
  Administered 2016-05-18: 1300 [IU]/h via INTRAVENOUS
  Filled 2016-05-17 (×2): qty 250

## 2016-05-17 MED ORDER — HYDRALAZINE HCL 25 MG PO TABS
25.0000 mg | ORAL_TABLET | Freq: Three times a day (TID) | ORAL | Status: DC
  Administered 2016-05-17 – 2016-05-18 (×3): 25 mg via ORAL
  Filled 2016-05-17 (×3): qty 1

## 2016-05-17 MED ORDER — ALBUTEROL SULFATE (2.5 MG/3ML) 0.083% IN NEBU: 2.5000 mg | INHALATION_SOLUTION | Freq: Four times a day (QID) | RESPIRATORY_TRACT | Status: DC | PRN

## 2016-05-17 MED ORDER — IPRATROPIUM-ALBUTEROL 0.5-2.5 (3) MG/3ML IN SOLN: 3.0000 mL | Freq: Four times a day (QID) | RESPIRATORY_TRACT | Status: DC

## 2016-05-17 NOTE — Progress Notes (Signed)
ANTICOAGULATION CONSULT NOTE - Follow Up Consult  Pharmacy Consult for Heparin drip             Indication: atrial fibrillation       Allergies  Allergen Reactions  . Ciprofloxacin     Other reaction(s): Other (See Comments) Pt states he could hardly move  . Sitagliptin     Other reaction(s): Other (See Comments) Weakness    Patient Measurements: Height: 5\' 9"  (175.3 cm) Weight: 207 lb 7.3 oz (94.1 kg) IBW/kg (Calculated) : 70.7 Heparin Dosing Weight: 90 kg  Vital Signs: Temp: 97.9 F (36.6 C) (03/17 2013) Temp Source: Oral (03/17 2013) BP: 150/111 (03/17 1700) Pulse Rate: 107 (03/17 1700)  Labs:  HL= 0.32 (0.42)  CBC Latest Ref Rng & Units 05/17/2016 05/16/2016 05/15/2016  WBC 3.8 - 10.6 K/uL 22.3(H) 22.7(H) 22.7(H)  Hemoglobin 13.0 - 18.0 g/dL 11.7(L) 11.7(L) 11.9(L)  Hematocrit 40.0 - 52.0 % 35.1(L) 35.4(L) 36.1(L)  Platelets 150 - 440 K/uL 130(L) 141(L) 130(L)     Assessment: 76 yo male with PMH of A. Fib. Patient has been taking Dabigatran BID at home until he runs out of current prescription- then plan was for him to start apixaban 5mg  BID. Pharmacy has been consulted to restart heparin without bolus due to recent groin bleed.   Goal of Therapy: Heparin level 0.3-0.7 units/ml aPTT 66-102seconds Monitor platelets by anticoagulation protocol: Yes  Plan:. Plan to restart heparin drip today. Once renal function improves- plan is for patient to be transitioned to dabigatran.   No bolus per MD due to recent groin bleed. Drip not to start till 1500 today. Start drip at 1400 units/hr based on last dose patient was on with therapeutic Xa level.  Will recheck HL in 8 hours after initiation.   Pernell Dupre, PharmD, BCPS Clinical Pharmacist 05/17/2016 11:43 AM

## 2016-05-17 NOTE — Clinical Social Work Note (Signed)
CSW contacted the patient's HCPOA to discuss dc planning. CSW provided all current bed offers. The patient's HCPOA indicated that [facility] is a hard no, and that she will be visiting and calling all other facilities. CSW will con't to follow.  Santiago Bumpers, MSW, Latanya Presser [phone]

## 2016-05-17 NOTE — Evaluation (Signed)
Occupational Therapy Evaluation Patient Details Name: Todd Valentine MRN: [id number] DOB: 07/14/1940 Today's Date: 05/17/2016    History of Present Illness Pt is a 76 yo male presented w/ increased weakness and a fall where he hit his head, pt experienced a-fib with rapid ventricular response and shown to have pneumonia, admitted to ICU and placed on Bipap. During course of hospital stay patient expereinced ARF placed on CRRT and discontinued 05/12/16. Had femoral artery catherer was removed yesterday . PMH includes; A-fib, CHF, CAD, diabetes, HTN, HLD, and melanoma   Clinical Impression   Pt very motivated to get better - show decrease strength and act tolerance limiting his functional mobility and performing ADL's( LB > UB)  - considering pt was in bed for close to week , pt did very well - and wants to return to prior level of function.     Follow Up Recommendations    STR   Equipment Recommendations    [facility], [facility]   Recommendations for Other Services       Precautions / Restrictions        Mobility Bed Mobility               General bed mobility comments: Supine to sit min A ,   Transfers Overall transfer level: Needs assistance Equipment used: Standard walker             General transfer comment: sit to stand Min A and v/c for hand placement, transfer to chair using walker min A    Balance Overall balance assessment: History of Falls (Did not show LOB during sittting with LB dressing and transfer to  chair )                                         ADL either performed or assessed with clinical judgement   ADL                                         General ADL Comments: Mod A for LB dressing - mostly R LE ;  UB dressing min A, Bathing mod A back and LE, grooming setup and SBA, eating setup , Toiletting still catheter in      Vision Baseline Vision/History: Wears glasses Patient Visual Report: No change from  baseline       Perception     Praxis      Pertinent Vitals/Pain Pain Assessment: No/denies pain     Hand Dominance Right   Extremity/Trunk Assessment             Communication Communication Communication: No difficulties   Cognition Arousal/Alertness: Awake/alert Behavior During Therapy: WFL for tasks assessed/performed Overall Cognitive Status: Within Functional Limits for tasks assessed                                     General Comments       Exercises     Shoulder Instructions      Home Living Family/patient expects to be discharged to:: Private residence Living Arrangements: Alone Available Help at Discharge: Family;Available PRN/intermittently Type of Home: House Home Access: Stairs to enter [employer] of Steps: 5 Entrance Stairs-Rails: Can reach both Home Layout: One level  Bathroom Shower/Tub: Gaffer;Tub/shower unit   Bathroom Toilet: Standard Bathroom Accessibility: Yes   Home Equipment: None          Prior Functioning/Environment Level of Independence: Independent        Comments: Was working  Tues thru Thursdays - 10 hrs days at [employer] , [place] in all ADL's and IADL's         OT Problem List:        OT Treatment/Interventions:      OT Goals(Current goals can be found in the care plan section) Acute Rehab OT Goals Patient Stated Goal: To get better and return home OT Goal Formulation: With patient/family Time For Goal Achievement: 06/07/16 Potential to Achieve Goals: Good  OT Frequency:     Barriers to D/C:            Co-evaluation              End of Session    Activity Tolerance:   Patient left:                     Time: 6435-3912 OT Time Calculation (min): 31 min Charges:  OT Evaluation $OT Eval Moderate Complexity: 1 Procedure G-Codes:       Rosalyn Gess OTR/L,CLT 05/17/2016, 10:08 AM

## 2016-05-17 NOTE — Progress Notes (Signed)
[facility] [place] for electrolyte management   Pharmacy consulted for electrolyte management for 76 yo male admitted with PNA/Afib. Patient is currently off of CRRT (stopped 3/19)  Plan:  No replacement warranted at this time. Follow-up electrolytes ordered with am labs.   Allergies  Allergen Reactions  . Ciprofloxacin     Other reaction(s): Other (See Comments) Pt states he could hardly move  . Sitagliptin     Other reaction(s): Other (See Comments) Weakness    Patient Measurements: Height: 5\' 9"  (175.3 cm) Weight: 224 lb 4.8 oz (101.7 kg) IBW/kg (Calculated) : 70.7   Vital Signs: Temp: 97.9 F (36.6 C) (03/24 0845) Temp Source: Oral (03/24 0845) BP: 180/69 (03/24 0845) Pulse Rate: 107 (03/24 0845) Intake/Output from previous day: 03/23 0701 - 03/24 0700 In: 123 [P.O.:120; I.V.:3] Out: 6301 [Urine:6300; Stool:1] Intake/Output from this shift: Total I/O In: -  Out: 425 [Urine:425]  Labs:  Recent Labs  05/15/16 0332 05/16/16 0432 05/17/16 0542  WBC 22.7* 22.7* 22.3*  HGB 11.9* 11.7* 11.7*  HCT 36.1* 35.4* 35.1*  PLT 130* 141* 130*  CREATININE 2.34* 2.24* 1.86*  PHOS 5.0* 4.5 3.8  ALBUMIN 2.4* 2.5* 2.5*   Estimated Creatinine Clearance: 40.3 mL/min (A) (by C-G formula based on SCr of 1.86 mg/dL (H)).  Lab Results  Component Value Date   K 3.5 05/17/2016     Pharmacy will continue to monitor and adjust per consult.   Olivia Canter [facility] Clinical Pharmacist 05/17/2016 8:52 AM

## 2016-05-17 NOTE — Progress Notes (Signed)
[facility] NAME: Todd Valentine    MR#:  [id number]  DATE OF BIRTH:  1940/04/15  SUBJECTIVE:  CHIEF COMPLAINT:   Chief Complaint  Patient presents with  . Loss of Consciousness   The patient is Very weak. Still having cough. Daughter at bedside has multiple questions   Review of Systems  Constitutional: Negative for chills, fever and weight loss.  HENT: Negative for congestion.   Eyes: Negative for blurred vision and double vision.  Respiratory: Positive for cough and shortness of breath. Negative for sputum production and wheezing.   Cardiovascular: Positive for leg swelling. Negative for chest pain, palpitations, orthopnea and PND.  Gastrointestinal: Negative for abdominal pain, blood in stool, constipation, diarrhea, nausea and vomiting.  Genitourinary: Negative for dysuria, frequency, hematuria and urgency.  Musculoskeletal: Negative for falls.  Neurological: Negative for dizziness, tremors, focal weakness and headaches.  Endo/Heme/Allergies: Does not bruise/bleed easily.  Psychiatric/Behavioral: Negative for depression. The patient does not have insomnia.     VITAL SIGNS: Blood pressure (!) 179/79, pulse (!) 108, temperature 98.2 F (36.8 C), temperature source Oral, resp. rate 20, height 5\' 9"  (1.753 m), weight 224 lb 4.8 oz (101.7 kg), SpO2 95 %.  PHYSICAL EXAMINATION:   GENERAL:  76 y.o.-year-old patient sitting in the bed with no acute distress, eating lunch  EYES: Pupils equal, round, reactive to light and accommodation. No scleral icterus. Extraocular muscles intact.  HEENT: Head atraumatic, normocephalic. Oropharynx and nasopharynx clear. Dry scaled oral mucosa/lips NECK:  Supple, no jugular venous distention. No thyroid enlargement, no tenderness.  LUNGS: Normal breath sounds bilaterally, no wheezing, rales,rhonchi , diffusely scattered crepitations. Intermittent use of accessory muscles of  respiration. Rhonchus breath sounds CARDIOVASCULAR: S1, S2 normal. No murmurs, rubs, or gallops.  ABDOMEN: Soft, nontender, nondistended. Bowel sounds present. No organomegaly or mass.  EXTREMITIES: Trace lower extremity and pedal edema, no cyanosis, or clubbing.  NEUROLOGIC: Cranial nerves II through XII are intact. Muscle strength 5/5 in all extremities. Sensation intact. Gait not checked.  PSYCHIATRIC: The patient is alert and oriented x 3.  SKIN: No obvious rash, lesion, or ulcer.   ORDERS/RESULTS REVIEWED:   CBC  Recent Labs Lab 05/13/16 0459 05/14/16 0435 05/15/16 0332 05/16/16 0432 05/17/16 0542  WBC 20.0* 23.6* 22.7* 22.7* 22.3*  HGB 11.7* 12.0* 11.9* 11.7* 11.7*  HCT 34.4* 36.2* 36.1* 35.4* 35.1*  PLT 119* 131* 130* 141* 130*  MCV 88.1 88.6 88.9 88.9 88.0  MCH 29.9 29.5 29.2 29.5 29.5  MCHC 34.0 33.3 32.9 33.2 33.5  RDW 14.4 14.8* 14.9* 14.7* 14.8*  LYMPHSABS  --   --   --  1.6  --   MONOABS  --   --   --  1.1*  --   EOSABS  --   --   --  0.0  --   BASOSABS  --   --   --  0.0  --    ------------------------------------------------------------------------------------------------------------------  Chemistries   Recent Labs Lab 05/11/16 0512  05/12/16 0539  05/13/16 0459 05/14/16 0435 05/15/16 0332 05/16/16 0432 05/17/16 0542  NA 132*  < > 132*  < > 133* 135 137 141 141  K 4.5  < > 4.3  < > 4.2 4.0 4.0 4.1 3.5  CL 93*  < > 99*  < > 100* 102 104 106 104  CO2 25  < > 25  < > 26 25 26 29 30   GLUCOSE 231*  < >  126*  < > 129* 116* 109* 157* 147*  BUN 89*  < > 54*  < > 77* 83* 75* 68* 54*  CREATININE 3.19*  < > 1.84*  < > 2.65* 2.52* 2.34* 2.24* 1.86*  CALCIUM 8.0*  < > 8.2*  < > 7.7* 7.9* 8.2* 8.5* 8.4*  MG 2.5*  --  2.0  --   --   --   --   --   --   < > = values in this interval not displayed. ------------------------------------------------------------------------------------------------------------------ estimated creatinine clearance is 40.3 mL/min (A)  (by C-G formula based on SCr of 1.86 mg/dL (H)). ------------------------------------------------------------------------------------------------------------------ No results for input(s): TSH, T4TOTAL, T3FREE, THYROIDAB in the last 72 hours.  Invalid input(s): FREET3  Cardiac Enzymes  Recent Labs Lab 05/11/16 0030 05/11/16 0827 05/11/16 1327  TROPONINI 0.55* 0.38* 0.42*   ------------------------------------------------------------------------------------------------------------------ Invalid input(s): POCBNP ---------------------------------------------------------------------------------------------------------------  RADIOLOGY: Ct Chest Wo Contrast  Result Date: 05/17/2016 CLINICAL DATA:  76 year old male with a history of pneumonia EXAM: CT CHEST WITHOUT CONTRAST TECHNIQUE: Multidetector CT imaging of the chest was performed following the standard protocol without IV contrast. COMPARISON:  CT 05/08/2016 FINDINGS: Cardiovascular: Since the prior CT there has been development of trace pericardial fluid/ thickening. Unchanged heart size. Calcifications of the aortic valve.  Aortic atherosclerosis. Calcifications of left main, left anterior descending, circumflex, right coronary artery. Mediastinum/Nodes: Similar distribution and number of mediastinal lymph nodes, though since the prior there has been decreasing size throughout. Lungs/Pleura: Compare to prior there is improved confluent airspace opacity of the left upper lobe and left lower lobe, however, persisting mixed ground-glass and nodular opacity in similar distribution. New small left and right pleural effusions. Bronchial wall thickening. No interlobular septal thickening. Upper Abdomen: No acute abnormality. Musculoskeletal: No displaced fracture. Degenerative changes of the spine. IMPRESSION: Since the prior CT 05/08/2016, there has been partial clearing of left upper lobe and left lower lobe airspace disease, compatible with  improving multifocal pneumonia. Interval development of small bilateral pleural effusions. Interval development of trace pericardial fluid/thickening. Bilateral bronchial wall thickening, likely inflammatory. Re- demonstration of left main and 3 vessel coronary artery disease, aortic valve calcifications, and aortic atherosclerosis. Electronically Signed   By: [name]   On: 05/17/2016 11:55    EKG:  Orders placed or performed during the hospital encounter of 05/08/16  . ED EKG  . ED EKG  . EKG 12-Lead  . EKG 12-Lead    ASSESSMENT AND PLAN:  Principal Problem:   Sepsis ([place]) Active Problems:   Chronic atrial fibrillation ([name])   Coronary artery disease involving native coronary artery of native heart without angina pectoris   Aspiration pneumonia ([name])   Atrial fibrillation with RVR ([name])   Hypokalemia   Gastroenteritis   AKI (acute kidney injury) ([place])   Hypomagnesemia   Leukopenia   Essential hypertension   Demand ischemia ([name])   Acute respiratory failure with hypoxia ([place])   Community acquired pneumonia of left lung  #1. Sepsis due to. Bilateral left more than the right pneumonia, blood cultures are negative, continue Rocephin, doxycycline, And tenderness to require 4 L of oxygen,As well as leukocytosis I will order a CT scan without contrast to further evaluate   #2. Pneumonia, continue Rocephin, doxycycline, WBC still high CT as above  Suspect there is component of aspiration to this continued dysphagia 3 diet  #3. Acute  diastolic CHF, on IV Lasix and monitor renal function   #4. Acute renal failure, improved on CRRT, continue to  monitor GFR   #5 chronic atrial fibrillation, heart rate is well controlled on Coreg  #6. Hyponatremia, resolved, following closely  #7. Leukocytosis, white blood cell count remains very high,  we'll obtain a CT of the chest as above  #8. Respiratory failure with hypoxia due to bilateral pneumonia, acute diastolic CHF, continue  oxygen therapy  as needed, now on 4 L, BiPAP at nighttime,   I will start him on incentive spirometry and pulmonary toileting  #9  Thrombocytopenia, improving Management plans discussed with the patient, family and they are in agreement.   DRUG ALLERGIES:  Allergies  Allergen Reactions  . Ciprofloxacin     Other reaction(s): Other (See Comments) Pt states he could hardly move  . Sitagliptin     Other reaction(s): Other (See Comments) Weakness    CODE STATUS:     Code Status Orders        Start     Ordered   05/09/16 1116  Limited resuscitation (code)  Continuous    Question Answer Comment  In the event of cardiac or respiratory ARREST: Initiate Code Blue, Call Rapid Response Yes   In the event of cardiac or respiratory ARREST: Perform CPR Yes   In the event of cardiac or respiratory ARREST: Perform Intubation/Mechanical Ventilation No   In the event of cardiac or respiratory ARREST: Use NIPPV/BiPAp only if indicated Yes   In the event of cardiac or respiratory ARREST: Administer ACLS medications if indicated Yes   In the event of cardiac or respiratory ARREST: Perform Defibrillation or Cardioversion if indicated Yes      05/09/16 1116    Code Status History    Date Active Date Inactive Code Status Order ID Comments User Context   05/09/2016  9:16 AM 05/09/2016 11:16 AM DNR [id number]  Hillary Bow, MD Inpatient   05/08/2016  2:18 PM 05/09/2016  9:16 AM Full Code [id number]  [name], MD Inpatient    Advance Directive Documentation     Most Recent Value  Type of Advance Directive  Healthcare Power of [place]  Pre-existing out of facility DNR order (yellow form or pink MOST form)  -  "MOST" Form in Place?  -      TOTAL TIME TAKING CARE OF THIS PATIENT: 32  minutes.  Discussed this patient's daughter  Dustin Flock M.D on 05/17/2016 at 12:01 PM  Between 7am to 6pm - Pager - [phone]  After 6pm go to [email or web address] - password [email or web address]  [facility]  Office  [phone]  CC: Primary care physician; [name], MD

## 2016-05-17 NOTE — Progress Notes (Signed)
5cc of air removed from PAD to right groin at 0000 and site stable. Remaining 15cc removed this morning and site stable with no signs of bleeding or hematoma. PAD removed; gauze and tegaderm placed over site. Nursing staff will continue to monitor for any changes in patient status. Earleen Reaper, RN

## 2016-05-17 NOTE — Plan of Care (Signed)
Problem: Activity: Goal: Risk for activity intolerance will decrease Outcome: Progressing Patient assisted to the chair with physical therapy, tolerated well. Will continue to encourage mobility.

## 2016-05-17 NOTE — Progress Notes (Signed)
Progress Note  Patient Name: Todd Valentine Date of Encounter: 05/17/2016  Primary Cardiologist: Dr. Rockey Situ  Subjective   Todd Valentine is feeling better today. He was able to get to a chair and sit up for a while. Denies chest pain or shortness of breath.  Inpatient Medications    Scheduled Meds: . amLODipine  5 mg Oral BID  . atorvastatin  40 mg Oral q1800  . budesonide (PULMICORT) nebulizer solution  0.5 mg Nebulization BID  . carvedilol  25 mg Oral BID WC  . cefTRIAXone (ROCEPHIN)  IV  2 g Intravenous Daily  . chlorhexidine  15 mL Mouth Rinse BID  . doxycycline  100 mg Oral Q12H  . famotidine  20 mg Oral QHS  . furosemide  40 mg Oral BID  . guaiFENesin  20 mL Oral TID  . haloperidol lactate  2.5 mg Intravenous Once  . insulin aspart  0-5 Units Subcutaneous QHS  . insulin aspart  0-9 Units Subcutaneous TID WC  . ipratropium-albuterol  3 mL Nebulization Q6H  . mouth rinse  15 mL Mouth Rinse q12n4p  . sodium chloride flush  3 mL Intravenous Q12H  . tamsulosin  0.4 mg Oral Daily   Continuous Infusions:  PRN Meds: acetaminophen **OR** acetaminophen, bisacodyl, diltiazem, labetalol, lip balm, morphine injection, ondansetron **OR** ondansetron (ZOFRAN) IV, promethazine, traZODone   Vital Signs    Vitals:   05/17/16 0450 05/17/16 0541 05/17/16 0758 05/17/16 0845  BP: (!) 189/94 (!) 188/80  (!) 180/69  Pulse: 92 73  (!) 107  Resp: 18   (!) 22  Temp: 97.7 F (36.5 C)   97.9 F (36.6 C)  TempSrc: Oral   Oral  SpO2: 98%  96% 96%  Weight: 101.7 kg (224 lb 4.8 oz)     Height:        Intake/Output Summary (Last 24 hours) at 05/17/16 1036 Last data filed at 05/17/16 0800  Gross per 24 hour  Intake                0 ml  Output             6076 ml  Net            -6076 ml   Filed Weights   05/15/16 0612 05/16/16 0441 05/17/16 0450  Weight: 108.7 kg (239 lb 9.6 oz) 108.3 kg (238 lb 12.1 oz) 101.7 kg (224 lb 4.8 oz)    Telemetry    Atrial fibrillation. Rate 90s  to 120s. - Personally Reviewed  ECG   n/a  Physical Exam   GEN: Ill-appearing.  No acute distress.   Neck: No JVD Cardiac: Irregularly irregular. 2/6 holosystolic murmur at the apex. No rubs, or gallops.  Respiratory: Clear to auscultation bilaterally. GI: Soft, nontender, non-distended  MS: No edema; No deformity. Neuro:  Nonfocal  Psych: Normal affect   Labs    Chemistry Recent Labs Lab 05/15/16 0332 05/16/16 0432 05/17/16 0542  NA 137 141 141  K 4.0 4.1 3.5  CL 104 106 104  CO2 26 29 30   GLUCOSE 109* 157* 147*  BUN 75* 68* 54*  CREATININE 2.34* 2.24* 1.86*  CALCIUM 8.2* 8.5* 8.4*  ALBUMIN 2.4* 2.5* 2.5*  GFRNONAA 26* 27* 34*  GFRAA 30* 31* 39*  ANIONGAP 7 6 7      Hematology Recent Labs Lab 05/15/16 0332 05/16/16 0432 05/17/16 0542  WBC 22.7* 22.7* 22.3*  RBC 4.07* 3.98* 3.99*  HGB 11.9* 11.7* 11.7*  HCT 36.1*  35.4* 35.1*  MCV 88.9 88.9 88.0  MCH 29.2 29.5 29.5  MCHC 32.9 33.2 33.5  RDW 14.9* 14.7* 14.8*  PLT 130* 141* 130*    Cardiac Enzymes Recent Labs Lab 05/10/16 1236 05/11/16 0030 05/11/16 0827 05/11/16 1327  TROPONINI 0.43* 0.55* 0.38* 0.42*   No results for input(s): TROPIPOC in the last 168 hours.   BNP Recent Labs Lab 05/11/16 0512  BNP 459.0*     DDimer No results for input(s): DDIMER in the last 168 hours.   Radiology    No results found.  Cardiac Studies   Echo 05/08/16: Study Conclusions  - Left ventricle: The cavity size was mildly reduced. There was   mild concentric hypertrophy. Systolic function was normal. The   estimated ejection fraction was in the range of 50% to 55%.   Regional wall motion abnormalities cannot be excluded. The study   is not technically sufficient to allow evaluation of LV diastolic   function. - Mitral valve: There was mild regurgitation. - Left atrium: The atrium was mildly dilated. - Right ventricle: The cavity size was mildly dilated. Wall   thickness was normal. Systolic function  was mildly reduced. - Pulmonary arteries: Systolic pressure could not be accurately   estimated.  Patient Profile     76 y.o. male with chronic atrial fibrillation here with pneumonia, sepsis, and acute renal failure.  Assessment & Plan    # Long-standing persistent atrial fibrillation:  Heart rates are poorly-controlled. We will change diltiazem from 30 mg q8 hours prn to every 8 hours.  continue carvedilol 25 mg twice daily. Heparin was on hold due to groin bleed. This is not controlled. We'll resume this evening without a bolus. Plan to switch to Pradaxa when renal function stabilizes.  He recently purchased a 103 day supply. When this. He will switch to Eliquis as an outpatient.   # Hypertension:  Blood pressure is poorly-controlled.  Continue amlodipine and carvedilol.  We will add hydralazine 50 mg tid today. Schedule diltiazem as above.   # Volume overload: Furosemide restarted 3/23.  # Hyperlipidemia: Continue  atorvastatin.  # Acute renal function: Likely 2/2 sepsis and ATN.  Renal function continues to improve. Continue to hold his home ACE inhibitor and HCTZ.     Signed, Skeet Latch, MD  05/17/2016, 10:36 AM

## 2016-05-17 NOTE — Progress Notes (Signed)
[facility]  ROUNDING NOTE   Subjective:   Daughter at bedside.  UOP 6300 Creatinine 1.86 (2.24)  BIPAP  Wbc 22.3 with toxic granulocytes  Vascath removed yesterday  Objective:  Vital signs in last 24 hours:  Temp:  [97.6 F (36.4 C)-98.5 F (36.9 C)] 97.9 F (36.6 C) (03/24 0845) Pulse Rate:  [73-107] 107 (03/24 0845) Resp:  [18-28] 22 (03/24 0845) BP: (155-189)/(69-103) 180/69 (03/24 0845) SpO2:  [92 %-98 %] 96 % (03/24 0845) FiO2 (%):  [40 %] 40 % (03/24 0115) Weight:  [101.7 kg (224 lb 4.8 oz)] 101.7 kg (224 lb 4.8 oz) (03/24 0450)  Weight change: -6.558 kg (-14 lb 7.3 oz) Filed Weights   05/15/16 0612 05/16/16 0441 05/17/16 0450  Weight: 108.7 kg (239 lb 9.6 oz) 108.3 kg (238 lb 12.1 oz) 101.7 kg (224 lb 4.8 oz)    Intake/Output: I/O last 3 completed shifts: In: 243 [P.O.:120; I.V.:123] Out: 9924 [QASTM:1962; Stool:1]   Intake/Output this shift:  Total I/O In: -  Out: 425 [Urine:425]  Physical Exam: General: Ill appearing   Head: Laceration on left side of forehead  Eyes: Anicteric  Neck: Supple, trachea midline  Lungs:  Bilateral rhonchi   Heart: S1S2    Abdomen:  Soft, nontender, bowel sounds present  Extremities: trace peripheral edema.  Neurologic: Awake, alert, will follow simple commands  Skin: No lesions  Access: none    Basic Metabolic Panel:  Recent Labs Lab 05/11/16 0512  05/12/16 0539  05/13/16 0459 05/14/16 0435 05/15/16 0332 05/16/16 0432 05/17/16 0542  NA 132*  < > 132*  < > 133* 135 137 141 141  K 4.5  < > 4.3  < > 4.2 4.0 4.0 4.1 3.5  CL 93*  < > 99*  < > 100* 102 104 106 104  CO2 25  < > 25  < > 26 25 26 29 30   GLUCOSE 231*  < > 126*  < > 129* 116* 109* 157* 147*  BUN 89*  < > 54*  < > 77* 83* 75* 68* 54*  CREATININE 3.19*  < > 1.84*  < > 2.65* 2.52* 2.34* 2.24* 1.86*  CALCIUM 8.0*  < > 8.2*  < > 7.7* 7.9* 8.2* 8.5* 8.4*  MG 2.5*  --  2.0  --   --   --   --   --   --   PHOS  --   < > 3.6  3.5  < > 4.9*  4.8* 5.0* 4.5 3.8  < > = values in this interval not displayed.  Liver Function Tests:  Recent Labs Lab 05/13/16 0459 05/14/16 0435 05/15/16 0332 05/16/16 0432 05/17/16 0542  ALBUMIN 2.3* 2.4* 2.4* 2.5* 2.5*   No results for input(s): LIPASE, AMYLASE in the last 168 hours. No results for input(s): AMMONIA in the last 168 hours.  CBC:  Recent Labs Lab 05/13/16 0459 05/14/16 0435 05/15/16 0332 05/16/16 0432 05/17/16 0542  WBC 20.0* 23.6* 22.7* 22.7* 22.3*  NEUTROABS  --   --   --  20.0*  --   HGB 11.7* 12.0* 11.9* 11.7* 11.7*  HCT 34.4* 36.2* 36.1* 35.4* 35.1*  MCV 88.1 88.6 88.9 88.9 88.0  PLT 119* 131* 130* 141* 130*    Cardiac Enzymes:  Recent Labs Lab 05/10/16 1236 05/11/16 0030 05/11/16 0827 05/11/16 1327  TROPONINI 0.43* 0.55* 0.38* 0.42*    BNP: Invalid input(s): POCBNP  CBG:  Recent Labs Lab 05/16/16 0749 05/16/16 1155 05/16/16 1640 05/16/16 2042  05/17/16 0807  GLUCAP 142* 155* 173* 146* 143*    Microbiology: Results for orders placed or performed during the hospital encounter of 05/08/16  Culture, blood (routine x 2)     Status: None   Collection Time: 05/08/16  9:53 AM  Result Value Ref Range Status   Specimen Description BLOOD L ARM  Final   Special Requests BOTTLES DRAWN AEROBIC AND ANAEROBIC BCAV  Final   Culture NO GROWTH 5 DAYS  Final   Report Status 05/13/2016 FINAL  Final  Culture, blood (routine x 2)     Status: None   Collection Time: 05/08/16  9:53 AM  Result Value Ref Range Status   Specimen Description BLOOD R ARM  Final   Special Requests BOTTLES DRAWN AEROBIC AND ANAEROBIC BCLV  Final   Culture NO GROWTH 5 DAYS  Final   Report Status 05/13/2016 FINAL  Final  MRSA PCR Screening     Status: None   Collection Time: 05/08/16  2:31 PM  Result Value Ref Range Status   MRSA by PCR NEGATIVE NEGATIVE Final    Comment:        The GeneXpert MRSA Assay (FDA approved for NASAL specimens only), is one component of  a comprehensive MRSA colonization surveillance program. It is not intended to diagnose MRSA infection nor to guide or monitor treatment for MRSA infections.   Urine culture     Status: None   Collection Time: 05/09/16 11:33 AM  Result Value Ref Range Status   Specimen Description URINE, RANDOM  Final   Special Requests NONE  Final   Culture   Final    NO GROWTH Performed at [facility], [address] [place], [place] [address]    Report Status 05/11/2016 FINAL  Final  Culture, expectorated sputum-assessment     Status: None   Collection Time: 05/10/16  3:53 AM  Result Value Ref Range Status   Specimen Description SPUTUM  Final   Special Requests NONE  Final   Sputum evaluation   Final    Sputum specimen not acceptable for testing.  Please recollect.   C/DALE HOPKINS AT 7510 05/10/16.PMH   Report Status 05/10/2016 FINAL  Final    Coagulation Studies: No results for input(s): LABPROT, INR in the last 72 hours.  Urinalysis: No results for input(s): COLORURINE, LABSPEC, PHURINE, GLUCOSEU, HGBUR, BILIRUBINUR, KETONESUR, PROTEINUR, UROBILINOGEN, NITRITE, LEUKOCYTESUR in the last 72 hours.  Invalid input(s): APPERANCEUR    Imaging: No results found.   Medications:    . amLODipine  5 mg Oral BID  . atorvastatin  40 mg Oral q1800  . budesonide (PULMICORT) nebulizer solution  0.5 mg Nebulization BID  . carvedilol  25 mg Oral BID WC  . cefTRIAXone (ROCEPHIN)  IV  2 g Intravenous Daily  . chlorhexidine  15 mL Mouth Rinse BID  . doxycycline  100 mg Oral Q12H  . famotidine  20 mg Oral QHS  . furosemide  40 mg Oral BID  . guaiFENesin  20 mL Oral TID  . haloperidol lactate  2.5 mg Intravenous Once  . insulin aspart  0-5 Units Subcutaneous QHS  . insulin aspart  0-9 Units Subcutaneous TID WC  . ipratropium-albuterol  3 mL Nebulization Q6H  . mouth rinse  15 mL Mouth Rinse q12n4p  . sodium chloride flush  3 mL Intravenous Q12H  . tamsulosin  0.4 mg Oral Daily    acetaminophen **OR** acetaminophen, bisacodyl, diltiazem, labetalol, lip balm, morphine injection, ondansetron **OR** ondansetron (ZOFRAN)  IV, promethazine, traZODone  Assessment/ Plan:  76 y.o.white male with Atrial fibrillation, BPH, congestive heart failure, coronary artery disease, depression, diabetes mellitus type 2, hypertension, hyperlipidemia, history of malignant melanoma who was admitted to [facility] on 05/08/2016 for evaluation of fall.  Pt found to have extensive left sided pneumonia with sepsis, acute respiratory failure, acute renal failure.   1.  Acute renal failure with metabolic acidosis due to ATN from sepsis. (baseline Cr 1.0)  Nonoliguric urine output.  - Requiring renal replacement therapy until 3/19. Oliguric renal failure.  - Monitor daily for intermittent hemodialysis. No acute indication for dialysis.  - Restarted furosemide PO.  - discontinue foley catheter  2.  Acute respiratory failure due to left sided pneumonia: examined on nasal canula.   3.  Sepsis: Concern for pneumonia, cultures negative. Off vasopressors. Empiric antibiotics: ceftriaxone and doxycycline. Wbc not improving, with left shift. CT chest ordered. Afebrile.   4. Hypertension: elevations - restarted on amlodipine, carvedilol, furosemide, and tamsulosin.    LOS: [place], [place] 3/24/20189:31 AM

## 2016-05-17 NOTE — Evaluation (Signed)
Physical Therapy Evaluation Patient Details Name: Todd Valentine MRN: [id number] DOB: 06-12-1940 Today's Date: 05/17/2016   History of Present Illness  Pt is a 76 yo male presented w/ increased weakness and a fall where he hit his head, pt experienced a-fib with rapid ventricular response and shown to have pneumonia, admitted to ICU and placed on Bipap. During course of hospital stay patient expereinced ARF placed on CRRT and discontinued 05/12/16, femoral artery catherer was removed yesterday . PMH includes; A-fib, CHF, CAD, diabetes, HTN, HLD, and melanoma  Clinical Impression  Patient presents s/p fall at home, found to have pneumonia. In this session he is on 4L of O2, and has been largely sedentary the past few days. He is typically quite independent and still works. He requires use of RW and has slow reciprocal gait pattern with quick onset of fatigue, indicative of generalized deconditioning. He will not have 24 hour assistance at home, and would be more appropriate for SNF placement at this time to increase his independence and safety prior to return home.     Follow Up Recommendations SNF    Equipment Recommendations       Recommendations for Other Services       Precautions / Restrictions Precautions Precautions: Fall Restrictions Weight Bearing Restrictions: No      Mobility  Bed Mobility Overal bed mobility: Needs Assistance Bed Mobility: Supine to Sit     Supine to sit: Min guard;Min assist     General bed mobility comments: Patient requires minor assistance to complete bed transfer for trunk control.   Transfers Overall transfer level: Needs assistance Equipment used: Standard walker Transfers: Sit to/from Stand Sit to Stand: Min guard;Min assist         General transfer comment: Patient requires minimal assistance with elevating torso off bed surface.  Ambulation/Gait Ambulation/Gait assistance: Min guard Ambulation Distance (Feet): 90 Feet Assistive  device: Rolling walker (2 wheeled) Gait Pattern/deviations: Decreased step length - right;Decreased step length - left;Trunk flexed   Gait velocity interpretation: <1.8 ft/sec, indicative of risk for recurrent falls General Gait Details: Patient becomes fatigued after short bout of ambulation, O2 sats remained above 90% on 4L.   Stairs            Wheelchair Mobility    Modified Rankin (Stroke Patients Only)       Balance Overall balance assessment: History of Falls;Needs assistance Sitting-balance support: Bilateral upper extremity supported Sitting balance-Leahy Scale: Fair     Standing balance support: Bilateral upper extremity supported Standing balance-Leahy Scale: Fair                               Pertinent Vitals/Pain Pain Assessment: No/denies pain    Home Living Family/patient expects to be discharged to:: Private residence Living Arrangements: Alone Available Help at Discharge: Family;Available PRN/intermittently Type of Home: House Home Access: Stairs to enter Entrance Stairs-Rails: Can reach both Entrance Stairs-Number of Steps: 5 Home Layout: One level Home Equipment: None      Prior Function Level of Independence: Independent         Comments: Was working  Tues thru Thursdays - 10 hrs days at [employer] , Independ in all ADL's and IADL's      Hand Dominance   Dominant Hand: Right    Extremity/Trunk Assessment   Upper Extremity Assessment Upper Extremity Assessment: Overall WFL for tasks assessed    Lower Extremity Assessment Lower Extremity Assessment:  Generalized weakness       Communication   Communication: No difficulties  Cognition Arousal/Alertness: Awake/alert Behavior During Therapy: WFL for tasks assessed/performed Overall Cognitive Status: Within Functional Limits for tasks assessed                                        General Comments      Exercises     Assessment/Plan    PT  Assessment Patient needs continued PT services  PT Problem List Decreased balance;Decreased activity tolerance;Decreased mobility;Cardiopulmonary status limiting activity;Decreased strength       PT Treatment Interventions DME instruction;Therapeutic activities;Therapeutic exercise;Gait training;Stair training;Balance training;Functional mobility training;Neuromuscular re-education    PT Goals (Current goals can be found in the Care Plan section)  Acute Rehab PT Goals Patient Stated Goal: To get better and return home PT Goal Formulation: With patient/family Time For Goal Achievement: 05/28/16 Potential to Achieve Goals: Good    Frequency Min 2X/week   Barriers to discharge Decreased caregiver support Patient only has consistent assistance at night.     Co-evaluation               End of Session Equipment Utilized During Treatment: Oxygen;Gait belt Activity Tolerance: Patient tolerated treatment well Patient left: in chair;with chair alarm set;with call bell/phone within reach;with family/visitor present Nurse Communication: Mobility status PT Visit Diagnosis: Muscle weakness (generalized) (M62.81);History of falling (Z91.81)    Time: 7035-0093 PT Time Calculation (min) (ACUTE ONLY): 17 min   Charges:   PT Evaluation $PT Eval Moderate Complexity: 1 Procedure     PT G Codes:       [name] PT, DPT, CSCS    05/17/2016, 4:22 PM

## 2016-05-18 LAB — CBC
HCT: 38.4 % — ABNORMAL LOW (ref 40.0–52.0)
Hemoglobin: 12.6 g/dL — ABNORMAL LOW (ref 13.0–18.0)
MCH: 29.4 pg (ref 26.0–34.0)
MCHC: 32.8 g/dL (ref 32.0–36.0)
MCV: 89.7 fL (ref 80.0–100.0)
Platelets: 139 10*3/uL — ABNORMAL LOW (ref 150–440)
RBC: 4.28 MIL/uL — ABNORMAL LOW (ref 4.40–5.90)
RDW: 14.9 % — ABNORMAL HIGH (ref 11.5–14.5)
WBC: 17.3 10*3/uL — ABNORMAL HIGH (ref 3.8–10.6)

## 2016-05-18 LAB — RENAL FUNCTION PANEL
Albumin: 2.7 g/dL — ABNORMAL LOW (ref 3.5–5.0)
Anion gap: 6 (ref 5–15)
BUN: 40 mg/dL — ABNORMAL HIGH (ref 6–20)
CO2: 33 mmol/L — ABNORMAL HIGH (ref 22–32)
Calcium: 8.4 mg/dL — ABNORMAL LOW (ref 8.9–10.3)
Chloride: 102 mmol/L (ref 101–111)
Creatinine, Ser: 1.68 mg/dL — ABNORMAL HIGH (ref 0.61–1.24)
GFR calc Af Amer: 44 mL/min — ABNORMAL LOW (ref >60)
GFR calc non Af Amer: 38 mL/min — ABNORMAL LOW (ref >60)
Glucose, Bld: 136 mg/dL — ABNORMAL HIGH (ref 65–99)
Phosphorus: 3.3 mg/dL (ref 2.5–4.6)
Potassium: 3.2 mmol/L — ABNORMAL LOW (ref 3.5–5.1)
Sodium: 141 mmol/L (ref 135–145)

## 2016-05-18 LAB — GLUCOSE, CAPILLARY
Glucose-Capillary: 155 mg/dL — ABNORMAL HIGH (ref 65–99)
Glucose-Capillary: 244 mg/dL — ABNORMAL HIGH (ref 65–99)
Glucose-Capillary: 315 mg/dL — ABNORMAL HIGH (ref 65–99)
Glucose-Capillary: 173 mg/dL — ABNORMAL HIGH (ref 65–99)
Glucose-Capillary: 159 mg/dL — ABNORMAL HIGH (ref 65–99)

## 2016-05-18 LAB — HEPARIN LEVEL (UNFRACTIONATED): Heparin Unfractionated: 0.73 IU/mL — ABNORMAL HIGH (ref 0.30–0.70)

## 2016-05-18 MED ORDER — DABIGATRAN ETEXILATE MESYLATE 150 MG PO CAPS
150.0000 mg | ORAL_CAPSULE | Freq: Two times a day (BID) | ORAL | Status: DC
  Administered 2016-05-18 – 2016-05-19 (×3): 150 mg via ORAL
  Filled 2016-05-18 (×3): qty 1

## 2016-05-18 MED ORDER — POTASSIUM CHLORIDE 20 MEQ PO PACK
20.0000 meq | PACK | Freq: Three times a day (TID) | ORAL | Status: AC
  Administered 2016-05-18 (×2): 20 meq via ORAL
  Filled 2016-05-18 (×2): qty 1

## 2016-05-18 MED ORDER — HYDRALAZINE HCL 50 MG PO TABS
50.0000 mg | ORAL_TABLET | Freq: Three times a day (TID) | ORAL | Status: DC
  Administered 2016-05-18 – 2016-05-19 (×3): 50 mg via ORAL
  Filled 2016-05-18 (×3): qty 1

## 2016-05-18 MED ORDER — CEFUROXIME AXETIL 500 MG PO TABS
500.0000 mg | ORAL_TABLET | Freq: Two times a day (BID) | ORAL | Status: DC
  Administered 2016-05-18 – 2016-05-19 (×3): 500 mg via ORAL
  Filled 2016-05-18 (×3): qty 1

## 2016-05-18 MED ORDER — DILTIAZEM HCL ER COATED BEADS 120 MG PO CP24
120.0000 mg | ORAL_CAPSULE | Freq: Every day | ORAL | Status: DC
  Administered 2016-05-18: 120 mg via ORAL
  Filled 2016-05-18: qty 1

## 2016-05-18 MED ORDER — POTASSIUM CHLORIDE CRYS ER 20 MEQ PO TBCR
40.0000 meq | EXTENDED_RELEASE_TABLET | Freq: Once | ORAL | Status: AC
  Administered 2016-05-18: 40 meq via ORAL
  Filled 2016-05-18: qty 2

## 2016-05-18 NOTE — Progress Notes (Addendum)
ANTICOAGULATION CONSULT NOTE - Follow Up Consult  Pharmacy Consult for Heparin drip             Indication: atrial fibrillation       Allergies  Allergen Reactions  . Ciprofloxacin     Other reaction(s): Other (See Comments) Pt states he could hardly move  . Sitagliptin     Other reaction(s): Other (See Comments) Weakness    Patient Measurements: Height: 5\' 9"  (175.3 cm) Weight: 207 lb 7.3 oz (94.1 kg) IBW/kg (Calculated) : 70.7 Heparin Dosing Weight: 90 kg  Vital Signs: Temp: 97.9 F (36.6 C) (03/17 2013) Temp Source: Oral (03/17 2013) BP: 150/111 (03/17 1700) Pulse Rate: 107 (03/17 1700)  Labs:  HL= 0.32 (0.42)  CBC Latest Ref Rng & Units 05/17/2016 05/16/2016 05/15/2016  WBC 3.8 - 10.6 K/uL 22.3(H) 22.7(H) 22.7(H)  Hemoglobin 13.0 - 18.0 g/dL 11.7(L) 11.7(L) 11.9(L)  Hematocrit 40.0 - 52.0 % 35.1(L) 35.4(L) 36.1(L)  Platelets 150 - 440 K/uL 130(L) 141(L) 130(L)     Assessment: 76 yo male with PMH of A. Fib. Patient has been taking Dabigatran BID at home until he runs out of current prescription- then plan was for him to start apixaban 5mg  BID. Pharmacy has been consulted to restart heparin without bolus due to recent groin bleed.   Goal of Therapy: Heparin level 0.3-0.7 units/ml aPTT 66-102seconds Monitor platelets by anticoagulation protocol: Yes  Plan:. Plan to restart heparin drip today. Once renal function improves- plan is for patient to be transitioned to dabigatran.   No bolus per MD due to recent groin bleed. Drip not to start till 1500 today. Start drip at 1400 units/hr based on last dose patient was on with therapeutic Xa level.  Will recheck HL in 8 hours after initiation.   3/24 22:30 heparin level 0.38. Recheck with AM labs.  3/25 AM heparin level 0.73. Decrease to 1300 units/hr and recheck in 8 hours.  Eloise Harman, PharmD, BCPS Clinical Pharmacist 05/18/2016 12:30 AM

## 2016-05-18 NOTE — Progress Notes (Signed)
ANTICOAGULATION CONSULT NOTE - Initial Consult  Pharmacy Consult for Pradaxa (Dabigatran)  Indication: atrial fibrillation  Allergies  Allergen Reactions  . Ciprofloxacin     Other reaction(s): Other (See Comments) Pt states he could hardly move  . Sitagliptin     Other reaction(s): Other (See Comments) Weakness    Patient Measurements: Height: 5\' 9"  (175.3 cm) Weight: 222 lb 9.6 oz (101 kg) IBW/kg (Calculated) : 70.7  Vital Signs: Temp: 98 F (36.7 C) (03/25 0449) Temp Source: Oral (03/25 0449) BP: 166/65 (03/25 0606) Pulse Rate: 87 (03/25 0449)  Labs:  Recent Labs  05/16/16 0432 05/17/16 0542 05/17/16 2237 05/18/16 0550  HGB 11.7* 11.7*  --  12.6*  HCT 35.4* 35.1*  --  38.4*  PLT 141* 130*  --  139*  HEPARINUNFRC 0.65  --  0.38 0.73*  CREATININE 2.24* 1.86*  --  1.68*    Estimated Creatinine Clearance: 44.5 mL/min (A) (by C-G formula based on SCr of 1.68 mg/dL (H)).   Medical History: Past Medical History:  Diagnosis Date  . Acute renal failure ([place])   . BPH (benign prostatic hyperplasia)   . Chronic atrial fibrillation ([place])   . Chronic diastolic CHF (congestive heart failure) ([name])    a. echo as above  . Chronic insomnia   . Coronary artery disease, non-occlusive 10/06/2013   a. cath 09/2013: pLAD 40, CTO Diag, ramus 40-->80, OM 50 mRCA 40, med rx  . Depression   . Diabetes mellitus with complication ([place])   . History of diverticulitis   . Hyperkalemia   . Hyperlipidemia   . Hypertension   . Malignant melanoma ([place])   . Mitral regurgitation    a. echo 09/2013: EF 55-60%, mildly dilated left atrrium, mild to moderate MR/TR  . Sleep apnea in adult      Assessment: 76 yo male with PMH of A. Fib. Patient has been taking Dabigatran 150mg  BID at home until he runs out of current prescription- then plan was for him to start apixaban 5mg  BID. Pharmacy has was consulted for heparin dosing, not consulted for Dabigatran (Pradaxa) initiation.  Plan:   Will start patient on dabigatran 150mg  BID. Heparin drip to be discontinued at the time of Dabigatran initiation.   Pernell Dupre, PharmD, BCPS Clinical Pharmacist 05/18/2016 9:46 AM

## 2016-05-18 NOTE — Progress Notes (Signed)
RT placed pt on bipap with gel pad over nose and gauze per families request. Pt resting, no issues at this time

## 2016-05-18 NOTE — Progress Notes (Addendum)
Progress Note  Patient Name: Todd Valentine Date of Encounter: 05/18/2016  Primary Cardiologist: Dr. Rockey Situ  Subjective   Todd Valentine is feeling better today. He spent all morning in the chair. Breathing has improved significantly.  Inpatient Medications    Scheduled Meds: . amLODipine  5 mg Oral BID  . atorvastatin  40 mg Oral q1800  . budesonide (PULMICORT) nebulizer solution  0.5 mg Nebulization BID  . carvedilol  25 mg Oral BID WC  . cefUROXime  500 mg Oral BID WC  . chlorhexidine  15 mL Mouth Rinse BID  . dabigatran  150 mg Oral Q12H  . diltiazem  30 mg Oral Q8H  . doxycycline  100 mg Oral Q12H  . famotidine  20 mg Oral QHS  . furosemide  40 mg Oral BID  . guaiFENesin  20 mL Oral TID  . haloperidol lactate  2.5 mg Intravenous Once  . hydrALAZINE  25 mg Oral Q8H  . insulin aspart  0-5 Units Subcutaneous QHS  . insulin aspart  0-9 Units Subcutaneous TID WC  . ipratropium-albuterol  3 mL Nebulization TID  . mouth rinse  15 mL Mouth Rinse q12n4p  . potassium chloride  20 mEq Oral TID AC, HS, 0200  . sodium chloride flush  3 mL Intravenous Q12H  . tamsulosin  0.4 mg Oral Daily   Continuous Infusions:  PRN Meds: acetaminophen **OR** acetaminophen, albuterol, bisacodyl, labetalol, lip balm, morphine injection, ondansetron **OR** ondansetron (ZOFRAN) IV, promethazine, traZODone   Vital Signs    Vitals:   05/18/16 0449 05/18/16 0606 05/18/16 0848 05/18/16 0946  BP: (!) 183/75 (!) 166/65  (!) 158/73  Pulse: 87   95  Resp: (!) 26   18  Temp: 98 F (36.7 C)   98.4 F (36.9 C)  TempSrc: Oral   Oral  SpO2: 98%  96% 97%  Weight: 101 kg (222 lb 9.6 oz)     Height:        Intake/Output Summary (Last 24 hours) at 05/18/16 1043 Last data filed at 05/18/16 0900  Gross per 24 hour  Intake              881 ml  Output             1850 ml  Net             -969 ml   Filed Weights   05/16/16 0441 05/17/16 0450 05/18/16 0449  Weight: 108.3 kg (238 lb 12.1 oz) 101.7  kg (224 lb 4.8 oz) 101 kg (222 lb 9.6 oz)    Telemetry    Atrial fibrillation. Rate 80s to 120s.  Mostly 80s to 90s. - Personally Reviewed  ECG   n/a  Physical Exam   GEN: well-appearing.  No acute distress.   Neck: No JVD Cardiac: Irregularly irregular. 2/6 holosystolic murmur at the apex. No rubs, or gallops.  Respiratory: Clear to auscultation bilaterally. GI: Soft, nontender, non-distended  MS: No edema; No deformity. Neuro:  Nonfocal  Psych: Normal affect   Labs    Chemistry  Recent Labs Lab 05/16/16 0432 05/17/16 0542 05/18/16 0550  NA 141 141 141  K 4.1 3.5 3.2*  CL 106 104 102  CO2 29 30 33*  GLUCOSE 157* 147* 136*  BUN 68* 54* 40*  CREATININE 2.24* 1.86* 1.68*  CALCIUM 8.5* 8.4* 8.4*  ALBUMIN 2.5* 2.5* 2.7*  GFRNONAA 27* 34* 38*  GFRAA 31* 39* 44*  ANIONGAP 6 7 6  Hematology  Recent Labs Lab 05/16/16 0432 05/17/16 0542 05/18/16 0550  WBC 22.7* 22.3* 17.3*  RBC 3.98* 3.99* 4.28*  HGB 11.7* 11.7* 12.6*  HCT 35.4* 35.1* 38.4*  MCV 88.9 88.0 89.7  MCH 29.5 29.5 29.4  MCHC 33.2 33.5 32.8  RDW 14.7* 14.8* 14.9*  PLT 141* 130* 139*    Cardiac Enzymes  Recent Labs Lab 05/11/16 1327  TROPONINI 0.42*   No results for input(s): TROPIPOC in the last 168 hours.   BNPNo results for input(s): BNP, PROBNP in the last 168 hours.   DDimer No results for input(s): DDIMER in the last 168 hours.   Radiology    Ct Chest Wo Contrast  Result Date: 05/17/2016 CLINICAL DATA:  76 year old male with a history of pneumonia EXAM: CT CHEST WITHOUT CONTRAST TECHNIQUE: Multidetector CT imaging of the chest was performed following the standard protocol without IV contrast. COMPARISON:  CT 05/08/2016 FINDINGS: Cardiovascular: Since the prior CT there has been development of trace pericardial fluid/ thickening. Unchanged heart size. Calcifications of the aortic valve.  Aortic atherosclerosis. Calcifications of left main, left anterior descending, circumflex,  right coronary artery. Mediastinum/Nodes: Similar distribution and number of mediastinal lymph nodes, though since the prior there has been decreasing size throughout. Lungs/Pleura: Compare to prior there is improved confluent airspace opacity of the left upper lobe and left lower lobe, however, persisting mixed ground-glass and nodular opacity in similar distribution. New small left and right pleural effusions. Bronchial wall thickening. No interlobular septal thickening. Upper Abdomen: No acute abnormality. Musculoskeletal: No displaced fracture. Degenerative changes of the spine. IMPRESSION: Since the prior CT 05/08/2016, there has been partial clearing of left upper lobe and left lower lobe airspace disease, compatible with improving multifocal pneumonia. Interval development of small bilateral pleural effusions. Interval development of trace pericardial fluid/thickening. Bilateral bronchial wall thickening, likely inflammatory. Re- demonstration of left main and 3 vessel coronary artery disease, aortic valve calcifications, and aortic atherosclerosis. Electronically Signed   By: [name]   On: 05/17/2016 11:55    Cardiac Studies   Echo 05/08/16: Study Conclusions  - Left ventricle: The cavity size was mildly reduced. There was   mild concentric hypertrophy. Systolic function was normal. The   estimated ejection fraction was in the range of 50% to 55%.   Regional wall motion abnormalities cannot be excluded. The study   is not technically sufficient to allow evaluation of LV diastolic   function. - Mitral valve: There was mild regurgitation. - Left atrium: The atrium was mildly dilated. - Right ventricle: The cavity size was mildly dilated. Wall   thickness was normal. Systolic function was mildly reduced. - Pulmonary arteries: Systolic pressure could not be accurately   estimated.  Patient Profile     76 y.o. male with chronic atrial fibrillation here with pneumonia, sepsis, and  acute renal failure.  Assessment & Plan    # Long-standing persistent atrial fibrillation:  Heart rates are better-controlled since adding diltiazem.  Will switch to 120 mg daily.  Renal function continues to improve. We will switch from heparin to dabigatran today. He will switch to Eliquis as an outpatient.   # Hypertension:  Blood pressure has improved from the 892J systolic to the 194R to 740C.  Continue amlodipine and carvedilol.  We will increase hydralazine to 50 mg tid today. Once we're able to restart his ACE-I, will stop amlodipine. Switch diltiazem to 120 mg daily.  # Volume overload: Furosemide 40 mg po bid restarted 3/23.  He  was negative 1.6L yesterday and renal function improving.    # Hyperlipidemia: Continue  atorvastatin.  # Acute renal function: Likely 2/2 sepsis and ATN.  Renal function continues to improve. Continue to hold his home ACE inhibitor and HCTZ.     Signed, Skeet Latch, MD  05/18/2016, 10:43 AM

## 2016-05-18 NOTE — Progress Notes (Signed)
[facility] NAME: Todd Valentine    MR#:  [id number]  DATE OF BIRTH:  [birth year]  SUBJECTIVE:  CHIEF COMPLAINT:   Chief Complaint  Patient presents with  . Loss of Consciousness   Pt feeling stronger, shortness of breath improved    Review of Systems  Constitutional: Negative for chills, fever and weight loss.  HENT: Negative for congestion.   Eyes: Negative for blurred vision and double vision.  Respiratory: Positive for cough and shortness of breath. Negative for sputum production and wheezing.   Cardiovascular: Positive for leg swelling. Negative for chest pain, palpitations, orthopnea and PND.  Gastrointestinal: Negative for abdominal pain, blood in stool, constipation, diarrhea, nausea and vomiting.  Genitourinary: Negative for dysuria, frequency, hematuria and urgency.  Musculoskeletal: Negative for falls.  Neurological: Negative for dizziness, tremors, focal weakness and headaches.  Endo/Heme/Allergies: Does not bruise/bleed easily.  Psychiatric/Behavioral: Negative for depression. The patient does not have insomnia.     VITAL SIGNS: Blood pressure (!) 156/71, pulse 86, temperature 97.9 F (36.6 C), temperature source Oral, resp. rate 18, height 5\' 9"  (1.753 m), weight 222 lb 9.6 oz (101 kg), SpO2 94 %.  PHYSICAL EXAMINATION:   GENERAL:  76 y.o.-year-old patient sitting in the bed with no acute distress, eating lunch  EYES: Pupils equal, round, reactive to light and accommodation. No scleral icterus. Extraocular muscles intact.  HEENT: Head atraumatic, normocephalic. Oropharynx and nasopharynx clear. Dry scaled oral mucosa/lips NECK:  Supple, no jugular venous distention. No thyroid enlargement, no tenderness.  LUNGS: Normal breath sounds bilaterally, no wheezing, rales,rhonchi , diffusely scattered crepitations. Intermittent use of accessory muscles of respiration. Rhonchus breath sounds CARDIOVASCULAR:  S1, S2 normal. No murmurs, rubs, or gallops.  ABDOMEN: Soft, nontender, nondistended. Bowel sounds present. No organomegaly or mass.  EXTREMITIES: Trace lower extremity and pedal edema, no cyanosis, or clubbing.  NEUROLOGIC: Cranial nerves II through XII are intact. Muscle strength 5/5 in all extremities. Sensation intact. Gait not checked.  PSYCHIATRIC: The patient is alert and oriented x 3.  SKIN: No obvious rash, lesion, or ulcer.   ORDERS/RESULTS REVIEWED:   CBC  Recent Labs Lab 05/14/16 0435 05/15/16 0332 05/16/16 0432 05/17/16 0542 05/18/16 0550  WBC 23.6* 22.7* 22.7* 22.3* 17.3*  HGB 12.0* 11.9* 11.7* 11.7* 12.6*  HCT 36.2* 36.1* 35.4* 35.1* 38.4*  PLT 131* 130* 141* 130* 139*  MCV 88.6 88.9 88.9 88.0 89.7  MCH 29.5 29.2 29.5 29.5 29.4  MCHC 33.3 32.9 33.2 33.5 32.8  RDW 14.8* 14.9* 14.7* 14.8* 14.9*  LYMPHSABS  --   --  1.6  --   --   MONOABS  --   --  1.1*  --   --   EOSABS  --   --  0.0  --   --   BASOSABS  --   --  0.0  --   --    ------------------------------------------------------------------------------------------------------------------  Chemistries   Recent Labs Lab 05/12/16 0539  05/14/16 0435 05/15/16 0332 05/16/16 0432 05/17/16 0542 05/18/16 0550  NA 132*  < > 135 137 141 141 141  K 4.3  < > 4.0 4.0 4.1 3.5 3.2*  CL 99*  < > 102 104 106 104 102  CO2 25  < > 25 26 29 30  33*  GLUCOSE 126*  < > 116* 109* 157* 147* 136*  BUN 54*  < > 83* 75* 68* 54* 40*  CREATININE 1.84*  < > 2.52* 2.34* 2.24* 1.86*  1.68*  CALCIUM 8.2*  < > 7.9* 8.2* 8.5* 8.4* 8.4*  MG 2.0  --   --   --   --   --   --   < > = values in this interval not displayed. ------------------------------------------------------------------------------------------------------------------ estimated creatinine clearance is 44.5 mL/min (A) (by C-G formula based on SCr of 1.68 mg/dL  (H)). ------------------------------------------------------------------------------------------------------------------ No results for input(s): TSH, T4TOTAL, T3FREE, THYROIDAB in the last 72 hours.  Invalid input(s): FREET3  Cardiac Enzymes No results for input(s): CKMB, TROPONINI, MYOGLOBIN in the last 168 hours.  Invalid input(s): CK ------------------------------------------------------------------------------------------------------------------ Invalid input(s): POCBNP ---------------------------------------------------------------------------------------------------------------  RADIOLOGY: Ct Chest Wo Contrast  Result Date: 05/17/2016 CLINICAL DATA:  76 year old male with a history of pneumonia EXAM: CT CHEST WITHOUT CONTRAST TECHNIQUE: Multidetector CT imaging of the chest was performed following the standard protocol without IV contrast. COMPARISON:  CT 05/08/2016 FINDINGS: Cardiovascular: Since the prior CT there has been development of trace pericardial fluid/ thickening. Unchanged heart size. Calcifications of the aortic valve.  Aortic atherosclerosis. Calcifications of left main, left anterior descending, circumflex, right coronary artery. Mediastinum/Nodes: Similar distribution and number of mediastinal lymph nodes, though since the prior there has been decreasing size throughout. Lungs/Pleura: Compare to prior there is improved confluent airspace opacity of the left upper lobe and left lower lobe, however, persisting mixed ground-glass and nodular opacity in similar distribution. New small left and right pleural effusions. Bronchial wall thickening. No interlobular septal thickening. Upper Abdomen: No acute abnormality. Musculoskeletal: No displaced fracture. Degenerative changes of the spine. IMPRESSION: Since the prior CT 05/08/2016, there has been partial clearing of left upper lobe and left lower lobe airspace disease, compatible with improving multifocal pneumonia. Interval  development of small bilateral pleural effusions. Interval development of trace pericardial fluid/thickening. Bilateral bronchial wall thickening, likely inflammatory. Re- demonstration of left main and 3 vessel coronary artery disease, aortic valve calcifications, and aortic atherosclerosis. Electronically Signed   By: [name]   On: 05/17/2016 11:55    EKG:  Orders placed or performed during the hospital encounter of 05/08/16  . ED EKG  . ED EKG  . EKG 12-Lead  . EKG 12-Lead    ASSESSMENT AND PLAN:  Principal Problem:   Sepsis ([place]) Active Problems:   Chronic atrial fibrillation ([name])   Coronary artery disease involving native coronary artery of native heart without angina pectoris   Aspiration pneumonia ([name])   Atrial fibrillation with RVR ([name])   Hypokalemia   Gastroenteritis   AKI (acute kidney injury) ([place])   Hypomagnesemia   Leukopenia   Essential hypertension   Demand ischemia ([name])   Acute respiratory failure with hypoxia ([place])   Community acquired pneumonia of left lung  #1. Sepsis due to. Bilateral left more than the right pneumonia, blood cultures are negative, continue Rocephin, doxycycline, And tenderness to require 4 L of oxygen, CT scan Shows improvement in the pneumonia. Continue pulmonary toileting  #2. Pneumonia, continue doxycycline oral antibiotics WBC trending down  #3. Acute  diastolic CHF, on  oral Lasix and monitor renal function   #4. Acute renal failure, improved on CRRT, continue to monitor GFR   #5 chronic atrial fibrillation, heart rate is well controlled on Coreg  #6. Hyponatremia, resolved, following closely  #7. Leukocytosis, white blood cell improved  #8. Respiratory failure with hypoxia due to bilateral pneumonia, acute diastolic CHF, continue oxygen therapy  as needed, now on 4 L, cpap   I will start him on incentive spirometry and pulmonary toileting  #9  Thrombocytopenia, improving Management plans discussed with the  patient, family and they are in agreement.   DRUG ALLERGIES:  Allergies  Allergen Reactions  . Ciprofloxacin     Other reaction(s): Other (See Comments) Pt states he could hardly move  . Sitagliptin     Other reaction(s): Other (See Comments) Weakness    CODE STATUS:     Code Status Orders        Start     Ordered   05/09/16 1116  Limited resuscitation (code)  Continuous    Question Answer Comment  In the event of cardiac or respiratory ARREST: Initiate Code Blue, Call Rapid Response Yes   In the event of cardiac or respiratory ARREST: Perform CPR Yes   In the event of cardiac or respiratory ARREST: Perform Intubation/Mechanical Ventilation No   In the event of cardiac or respiratory ARREST: Use NIPPV/BiPAp only if indicated Yes   In the event of cardiac or respiratory ARREST: Administer ACLS medications if indicated Yes   In the event of cardiac or respiratory ARREST: Perform Defibrillation or Cardioversion if indicated Yes      05/09/16 1116    Code Status History    Date Active Date Inactive Code Status Order ID Comments User Context   05/09/2016  9:16 AM 05/09/2016 11:16 AM DNR [id number]  Hillary Bow, MD Inpatient   05/08/2016  2:18 PM 05/09/2016  9:16 AM Full Code [id number]  [name], MD Inpatient    Advance Directive Documentation     Most Recent Value  Type of Advance Directive  Healthcare Power of [place]  Pre-existing out of facility DNR order (yellow form or pink MOST form)  -  "MOST" Form in Place?  -      TOTAL TIME TAKING CARE OF THIS PATIENT: 32  minutes.  Discussed this patient's daughter  Dustin Flock M.D on 05/18/2016 at 2:19 PM  Between 7am to 6pm - Pager - [phone]  After 6pm go to [email or web address] - password [email or web address] [facility]  Office  [phone]  CC: Primary care physician; [name], MD

## 2016-05-18 NOTE — Progress Notes (Signed)
[facility] for electrolyte management   Pharmacy consulted for electrolyte management for 76 yo male admitted with PNA/Afib. Patient is currently off of CRRT (stopped 3/19)  Plan:  No replacement warranted at this time. Follow-up electrolytes ordered with am labs.   3/25 AM K+ 3.2. 20 mEq KCl PO x 2 doses ordered. Recheck BMP in AM.  Allergies  Allergen Reactions  . Ciprofloxacin     Other reaction(s): Other (See Comments) Pt states he could hardly move  . Sitagliptin     Other reaction(s): Other (See Comments) Weakness    Patient Measurements: Height: 5\' 9"  (175.3 cm) Weight: 222 lb 9.6 oz (101 kg) IBW/kg (Calculated) : 70.7   Vital Signs: Temp: 98 F (36.7 C) (03/25 0449) Temp Source: Oral (03/25 0449) BP: 166/65 (03/25 0606) Pulse Rate: 87 (03/25 0449) Intake/Output from previous day: 03/24 0701 - 03/25 0700 In: 641 [P.O.:480; I.V.:161] Out: 2275 [Urine:2275] Intake/Output from this shift: No intake/output data recorded.  Labs:  Recent Labs  05/16/16 0432 05/17/16 0542 05/18/16 0550  WBC 22.7* 22.3* 17.3*  HGB 11.7* 11.7* 12.6*  HCT 35.4* 35.1* 38.4*  PLT 141* 130* 139*  CREATININE 2.24* 1.86* 1.68*  PHOS 4.5 3.8 3.3  ALBUMIN 2.5* 2.5* 2.7*   Estimated Creatinine Clearance: 44.5 mL/min (A) (by C-G formula based on SCr of 1.68 mg/dL (H)).  Lab Results  Component Value Date   K 3.2 (L) 05/18/2016     Pharmacy will continue to monitor and adjust per consult.   Eloise Harman [facility] Clinical Pharmacist 05/18/2016 7:12 AM

## 2016-05-18 NOTE — Progress Notes (Signed)
RT called to pts room due to concern with Bipap. Rt noted no issues with bipap, leak normal and values appropriate. Mask is straight and well fitted but pt is pulling and trying to readjust mask. RT adjusted mask for pt and pt stated he felt better now.

## 2016-05-18 NOTE — Progress Notes (Signed)
[facility]  ROUNDING NOTE   Subjective:   Daughters at bedside.  UOP 2275 Creatinine 1.68 (1.86) (2.24)  Objective:  Vital signs in last 24 hours:  Temp:  [98 F (36.7 C)-98.2 F (36.8 C)] 98 F (36.7 C) (03/25 0449) Pulse Rate:  [79-108] 87 (03/25 0449) Resp:  [18-26] 26 (03/25 0449) BP: (164-183)/(65-80) 166/65 (03/25 0606) SpO2:  [94 %-98 %] 98 % (03/25 0449) Weight:  [101 kg (222 lb 9.6 oz)] 101 kg (222 lb 9.6 oz) (03/25 0449)  Weight change: -0.771 kg (-1 lb 11.2 oz) Filed Weights   05/16/16 0441 05/17/16 0450 05/18/16 0449  Weight: 108.3 kg (238 lb 12.1 oz) 101.7 kg (224 lb 4.8 oz) 101 kg (222 lb 9.6 oz)    Intake/Output: I/O last 3 completed shifts: In: 641 [P.O.:480; I.V.:161] Out: 5925 [Urine:5925]   Intake/Output this shift:  No intake/output data recorded.  Physical Exam: General: Ill appearing   Head: Laceration on left side of forehead  Eyes: Anicteric  Neck: Supple, trachea midline  Lungs:  Bilateral rhonchi   Heart: S1S2    Abdomen:  Soft, nontender, bowel sounds present  Extremities: No peripheral edema.  Neurologic: Awake, alert, will follow simple commands  Skin: No lesions  Access: none    Basic Metabolic Panel:  Recent Labs Lab 05/12/16 0539  05/14/16 0435 05/15/16 0332 05/16/16 0432 05/17/16 0542 05/18/16 0550  NA 132*  < > 135 137 141 141 141  K 4.3  < > 4.0 4.0 4.1 3.5 3.2*  CL 99*  < > 102 104 106 104 102  CO2 25  < > 25 26 29 30  33*  GLUCOSE 126*  < > 116* 109* 157* 147* 136*  BUN 54*  < > 83* 75* 68* 54* 40*  CREATININE 1.84*  < > 2.52* 2.34* 2.24* 1.86* 1.68*  CALCIUM 8.2*  < > 7.9* 8.2* 8.5* 8.4* 8.4*  MG 2.0  --   --   --   --   --   --   PHOS 3.6  3.5  < > 4.8* 5.0* 4.5 3.8 3.3  < > = values in this interval not displayed.  Liver Function Tests:  Recent Labs Lab 05/14/16 0435 05/15/16 0332 05/16/16 0432 05/17/16 0542 05/18/16 0550  ALBUMIN 2.4* 2.4* 2.5* 2.5* 2.7*   No results for input(s):  LIPASE, AMYLASE in the last 168 hours. No results for input(s): AMMONIA in the last 168 hours.  CBC:  Recent Labs Lab 05/14/16 0435 05/15/16 0332 05/16/16 0432 05/17/16 0542 05/18/16 0550  WBC 23.6* 22.7* 22.7* 22.3* 17.3*  NEUTROABS  --   --  20.0*  --   --   HGB 12.0* 11.9* 11.7* 11.7* 12.6*  HCT 36.2* 36.1* 35.4* 35.1* 38.4*  MCV 88.6 88.9 88.9 88.0 89.7  PLT 131* 130* 141* 130* 139*    Cardiac Enzymes:  Recent Labs Lab 05/11/16 1327  TROPONINI 0.42*    BNP: Invalid input(s): POCBNP  CBG:  Recent Labs Lab 05/17/16 0807 05/17/16 1115 05/17/16 1655 05/17/16 2102 05/18/16 0751  GLUCAP 143* 189* 209* 153* 155*    Microbiology: Results for orders placed or performed during the hospital encounter of 05/08/16  Culture, blood (routine x 2)     Status: None   Collection Time: 05/08/16  9:53 AM  Result Value Ref Range Status   Specimen Description BLOOD L ARM  Final   Special Requests BOTTLES DRAWN AEROBIC AND ANAEROBIC BCAV  Final   Culture NO GROWTH 5 DAYS  Final   Report Status 05/13/2016 FINAL  Final  Culture, blood (routine x 2)     Status: None   Collection Time: 05/08/16  9:53 AM  Result Value Ref Range Status   Specimen Description BLOOD R ARM  Final   Special Requests BOTTLES DRAWN AEROBIC AND ANAEROBIC BCLV  Final   Culture NO GROWTH 5 DAYS  Final   Report Status 05/13/2016 FINAL  Final  MRSA PCR Screening     Status: None   Collection Time: 05/08/16  2:31 PM  Result Value Ref Range Status   MRSA by PCR NEGATIVE NEGATIVE Final    Comment:        The GeneXpert MRSA Assay (FDA approved for NASAL specimens only), is one component of a comprehensive MRSA colonization surveillance program. It is not intended to diagnose MRSA infection nor to guide or monitor treatment for MRSA infections.   Urine culture     Status: None   Collection Time: 05/09/16 11:33 AM  Result Value Ref Range Status   Specimen Description URINE, RANDOM  Final   Special  Requests NONE  Final   Culture   Final    NO GROWTH Performed at [facility], [address] [place],  [address]    Report Status 05/11/2016 FINAL  Final  Culture, expectorated sputum-assessment     Status: None   Collection Time: 05/10/16  3:53 AM  Result Value Ref Range Status   Specimen Description SPUTUM  Final   Special Requests NONE  Final   Sputum evaluation   Final    Sputum specimen not acceptable for testing.  Please recollect.   C/DALE HOPKINS AT 0947 05/10/16.PMH   Report Status 05/10/2016 FINAL  Final    Coagulation Studies: No results for input(s): LABPROT, INR in the last 72 hours.  Urinalysis: No results for input(s): COLORURINE, LABSPEC, PHURINE, GLUCOSEU, HGBUR, BILIRUBINUR, KETONESUR, PROTEINUR, UROBILINOGEN, NITRITE, LEUKOCYTESUR in the last 72 hours.  Invalid input(s): APPERANCEUR    Imaging: Ct Chest Wo Contrast  Result Date: 05/17/2016 CLINICAL DATA:  76 year old male with a history of pneumonia EXAM: CT CHEST WITHOUT CONTRAST TECHNIQUE: Multidetector CT imaging of the chest was performed following the standard protocol without IV contrast. COMPARISON:  CT 05/08/2016 FINDINGS: Cardiovascular: Since the prior CT there has been development of trace pericardial fluid/ thickening. Unchanged heart size. Calcifications of the aortic valve.  Aortic atherosclerosis. Calcifications of left main, left anterior descending, circumflex, right coronary artery. Mediastinum/Nodes: Similar distribution and number of mediastinal lymph nodes, though since the prior there has been decreasing size throughout. Lungs/Pleura: Compare to prior there is improved confluent airspace opacity of the left upper lobe and left lower lobe, however, persisting mixed ground-glass and nodular opacity in similar distribution. New small left and right pleural effusions. Bronchial wall thickening. No interlobular septal thickening. Upper Abdomen: No acute abnormality. Musculoskeletal: No  displaced fracture. Degenerative changes of the spine. IMPRESSION: Since the prior CT 05/08/2016, there has been partial clearing of left upper lobe and left lower lobe airspace disease, compatible with improving multifocal pneumonia. Interval development of small bilateral pleural effusions. Interval development of trace pericardial fluid/thickening. Bilateral bronchial wall thickening, likely inflammatory. Re- demonstration of left main and 3 vessel coronary artery disease, aortic valve calcifications, and aortic atherosclerosis. Electronically Signed   By: [name]   On: 05/17/2016 11:55     Medications:   . heparin 1,300 Units/hr (05/18/16 0962)   . amLODipine  5 mg Oral BID  .  atorvastatin  40 mg Oral q1800  . budesonide (PULMICORT) nebulizer solution  0.5 mg Nebulization BID  . carvedilol  25 mg Oral BID WC  . cefUROXime  500 mg Oral BID WC  . chlorhexidine  15 mL Mouth Rinse BID  . diltiazem  30 mg Oral Q8H  . doxycycline  100 mg Oral Q12H  . famotidine  20 mg Oral QHS  . furosemide  40 mg Oral BID  . guaiFENesin  20 mL Oral TID  . haloperidol lactate  2.5 mg Intravenous Once  . hydrALAZINE  25 mg Oral Q8H  . insulin aspart  0-5 Units Subcutaneous QHS  . insulin aspart  0-9 Units Subcutaneous TID WC  . ipratropium-albuterol  3 mL Nebulization TID  . mouth rinse  15 mL Mouth Rinse q12n4p  . potassium chloride  20 mEq Oral TID AC, HS, 0200  . potassium chloride  40 mEq Oral Once  . sodium chloride flush  3 mL Intravenous Q12H  . tamsulosin  0.4 mg Oral Daily   acetaminophen **OR** acetaminophen, albuterol, bisacodyl, labetalol, lip balm, morphine injection, ondansetron **OR** ondansetron (ZOFRAN) IV, promethazine, traZODone  Assessment/ Plan:  76 y.o.white male with Atrial fibrillation, BPH, congestive heart failure, coronary artery disease, depression, diabetes mellitus type 2, hypertension, hyperlipidemia, history of malignant melanoma who was admitted to [facility] on  05/08/2016   1.  Acute renal failure with metabolic acidosis due to ATN from sepsis. (baseline Cr 1.0)  Nonoliguric urine output.  Required renal replacement therapy until 3/19.  Nonoliguric urine output. No acute indication for dialysis. Creatinine improving.   Holding home dose lisinopril and hydrochlorothiazide.  - Continue furosemide PO.   2.  Acute respiratory failure due to left sided pneumonia: examined on nasal canula.   3.  Sepsis: Concern for pneumonia, cultures negative. Off vasopressors. Empiric antibiotics: cefuroxime and doxycycline. [id number]. CT chest 3/24 . Afebrile.   4. Hypertension: blood pressure better controlled.  - amlodipine, carvedilol, furosemide, and tamsulosin.    LOS: Todd Valentine, Todd Valentine 3/25/20189:20 AM

## 2016-05-18 NOTE — Progress Notes (Signed)
Pt. Walked around nurses desk x1, no SOB or acute distress noted.

## 2016-05-19 DIAGNOSIS — D72829 Elevated white blood cell count, unspecified: Secondary | ICD-10-CM | POA: Diagnosis not present

## 2016-05-19 DIAGNOSIS — E119 Type 2 diabetes mellitus without complications: Secondary | ICD-10-CM | POA: Diagnosis not present

## 2016-05-19 DIAGNOSIS — C439 Malignant melanoma of skin, unspecified: Secondary | ICD-10-CM | POA: Diagnosis not present

## 2016-05-19 DIAGNOSIS — N390 Urinary tract infection, site not specified: Secondary | ICD-10-CM | POA: Diagnosis not present

## 2016-05-19 DIAGNOSIS — W1830XA Fall on same level, unspecified, initial encounter: Secondary | ICD-10-CM | POA: Diagnosis not present

## 2016-05-19 DIAGNOSIS — I5033 Acute on chronic diastolic (congestive) heart failure: Secondary | ICD-10-CM | POA: Diagnosis not present

## 2016-05-19 DIAGNOSIS — N179 Acute kidney failure, unspecified: Secondary | ICD-10-CM | POA: Diagnosis not present

## 2016-05-19 DIAGNOSIS — I10 Essential (primary) hypertension: Secondary | ICD-10-CM | POA: Diagnosis not present

## 2016-05-19 DIAGNOSIS — I482 Chronic atrial fibrillation: Secondary | ICD-10-CM | POA: Diagnosis not present

## 2016-05-19 DIAGNOSIS — E782 Mixed hyperlipidemia: Secondary | ICD-10-CM | POA: Diagnosis not present

## 2016-05-19 DIAGNOSIS — J189 Pneumonia, unspecified organism: Secondary | ICD-10-CM | POA: Diagnosis not present

## 2016-05-19 DIAGNOSIS — A419 Sepsis, unspecified organism: Secondary | ICD-10-CM | POA: Diagnosis not present

## 2016-05-19 DIAGNOSIS — M6281 Muscle weakness (generalized): Secondary | ICD-10-CM | POA: Diagnosis not present

## 2016-05-19 DIAGNOSIS — J9601 Acute respiratory failure with hypoxia: Secondary | ICD-10-CM | POA: Diagnosis not present

## 2016-05-19 DIAGNOSIS — Z7401 Bed confinement status: Secondary | ICD-10-CM | POA: Diagnosis not present

## 2016-05-19 DIAGNOSIS — Z23 Encounter for immunization: Secondary | ICD-10-CM | POA: Diagnosis not present

## 2016-05-19 DIAGNOSIS — R6521 Severe sepsis with septic shock: Secondary | ICD-10-CM | POA: Diagnosis not present

## 2016-05-19 DIAGNOSIS — J181 Lobar pneumonia, unspecified organism: Secondary | ICD-10-CM | POA: Diagnosis not present

## 2016-05-19 DIAGNOSIS — I4891 Unspecified atrial fibrillation: Secondary | ICD-10-CM | POA: Diagnosis not present

## 2016-05-19 DIAGNOSIS — Z741 Need for assistance with personal care: Secondary | ICD-10-CM | POA: Diagnosis not present

## 2016-05-19 DIAGNOSIS — R262 Difficulty in walking, not elsewhere classified: Secondary | ICD-10-CM | POA: Diagnosis not present

## 2016-05-19 DIAGNOSIS — Z87891 Personal history of nicotine dependence: Secondary | ICD-10-CM | POA: Diagnosis not present

## 2016-05-19 DIAGNOSIS — Y92009 Unspecified place in unspecified non-institutional (private) residence as the place of occurrence of the external cause: Secondary | ICD-10-CM | POA: Diagnosis not present

## 2016-05-19 DIAGNOSIS — C4491 Basal cell carcinoma of skin, unspecified: Secondary | ICD-10-CM | POA: Diagnosis not present

## 2016-05-19 DIAGNOSIS — I2510 Atherosclerotic heart disease of native coronary artery without angina pectoris: Secondary | ICD-10-CM | POA: Diagnosis not present

## 2016-05-19 LAB — RENAL FUNCTION PANEL
Albumin: 2.7 g/dL — ABNORMAL LOW (ref 3.5–5.0)
Anion gap: 6 (ref 5–15)
BUN: 33 mg/dL — ABNORMAL HIGH (ref 6–20)
CO2: 35 mmol/L — ABNORMAL HIGH (ref 22–32)
Calcium: 8.6 mg/dL — ABNORMAL LOW (ref 8.9–10.3)
Chloride: 103 mmol/L (ref 101–111)
Creatinine, Ser: 1.64 mg/dL — ABNORMAL HIGH (ref 0.61–1.24)
GFR calc Af Amer: 46 mL/min — ABNORMAL LOW (ref >60)
GFR calc non Af Amer: 39 mL/min — ABNORMAL LOW (ref >60)
Glucose, Bld: 170 mg/dL — ABNORMAL HIGH (ref 65–99)
Phosphorus: 2.9 mg/dL (ref 2.5–4.6)
Potassium: 3.8 mmol/L (ref 3.5–5.1)
Sodium: 144 mmol/L (ref 135–145)

## 2016-05-19 LAB — CBC
HCT: 35.6 % — ABNORMAL LOW (ref 40.0–52.0)
Hemoglobin: 11.8 g/dL — ABNORMAL LOW (ref 13.0–18.0)
MCH: 29.5 pg (ref 26.0–34.0)
MCHC: 33.3 g/dL (ref 32.0–36.0)
MCV: 88.6 fL (ref 80.0–100.0)
Platelets: 132 10*3/uL — ABNORMAL LOW (ref 150–440)
RBC: 4.02 MIL/uL — ABNORMAL LOW (ref 4.40–5.90)
RDW: 14.9 % — ABNORMAL HIGH (ref 11.5–14.5)
WBC: 15.4 10*3/uL — ABNORMAL HIGH (ref 3.8–10.6)

## 2016-05-19 LAB — GLUCOSE, CAPILLARY
Glucose-Capillary: 201 mg/dL — ABNORMAL HIGH (ref 65–99)
Glucose-Capillary: 191 mg/dL — ABNORMAL HIGH (ref 65–99)

## 2016-05-19 LAB — MAGNESIUM: Magnesium: 1.5 mg/dL — ABNORMAL LOW (ref 1.7–2.4)

## 2016-05-19 MED ORDER — CARVEDILOL 25 MG PO TABS: 25.0000 mg | ORAL_TABLET | Freq: Two times a day (BID) | ORAL | Status: DC

## 2016-05-19 MED ORDER — FAMOTIDINE 20 MG PO TABS: 20.0000 mg | ORAL_TABLET | Freq: Every day | ORAL | Status: AC

## 2016-05-19 MED ORDER — DILTIAZEM HCL ER COATED BEADS 240 MG PO CP24
240.0000 mg | ORAL_CAPSULE | Freq: Every day | ORAL | Status: DC
  Administered 2016-05-19: 240 mg via ORAL
  Filled 2016-05-19: qty 1

## 2016-05-19 MED ORDER — CHLORHEXIDINE GLUCONATE 0.12 % MT SOLN: 15.0000 mL | Freq: Two times a day (BID) | OROMUCOSAL | 0 refills | Status: DC

## 2016-05-19 MED ORDER — MAGNESIUM SULFATE 2 GM/50ML IV SOLN
2.0000 g | Freq: Once | INTRAVENOUS | Status: AC
  Administered 2016-05-19: 2 g via INTRAVENOUS
  Filled 2016-05-19: qty 50

## 2016-05-19 MED ORDER — FUROSEMIDE 40 MG PO TABS: 40.0000 mg | ORAL_TABLET | Freq: Two times a day (BID) | ORAL | Status: DC

## 2016-05-19 MED ORDER — HYDRALAZINE HCL 50 MG PO TABS: 50.0000 mg | ORAL_TABLET | Freq: Three times a day (TID) | ORAL | 0 refills | Status: DC

## 2016-05-19 MED ORDER — GUAIFENESIN 100 MG/5ML PO SOLN: 20.0000 mL | Freq: Three times a day (TID) | ORAL | 0 refills | Status: DC

## 2016-05-19 MED ORDER — IPRATROPIUM-ALBUTEROL 0.5-2.5 (3) MG/3ML IN SOLN: 3.0000 mL | Freq: Three times a day (TID) | RESPIRATORY_TRACT | Status: DC

## 2016-05-19 MED ORDER — CEFUROXIME AXETIL 500 MG PO TABS: 500.0000 mg | ORAL_TABLET | Freq: Two times a day (BID) | ORAL | Status: DC

## 2016-05-19 MED ORDER — BUDESONIDE 0.5 MG/2ML IN SUSP: 0.5000 mg | Freq: Two times a day (BID) | RESPIRATORY_TRACT | 12 refills | Status: DC

## 2016-05-19 MED ORDER — DILTIAZEM HCL ER COATED BEADS 240 MG PO CP24: 240.0000 mg | ORAL_CAPSULE | Freq: Every day | ORAL | Status: DC

## 2016-05-19 NOTE — Clinical Social Work Note (Signed)
CSW was informed by patient's daughter they have chose [facility].  Patient to be d/c'ed today to [facility].  Patient and family agreeable to plans will transport via ems RN to call report to room [phone].  Evette Cristal, MSW, [place]

## 2016-05-19 NOTE — Discharge Instructions (Addendum)
Todd Valentine at [place]:  Dysphagia 3 diet with thin liquids  DISCHARGE CONDITION:  Stable  ACTIVITY:  Activity as tolerated  OXYGEN:  Home Oxygen: Yes.     Oxygen Delivery: 2 L O2 via Patient connected to nasal cannula oxygen  DISCHARGE LOCATION:  nursing home    ADDITIONAL DISCHARGE INSTRUCTION: cpap at bedtime    If you experience worsening of your admission symptoms, develop shortness of breath, life threatening emergency, suicidal or homicidal thoughts you must seek medical attention immediately by calling 911 or calling your MD immediately  if symptoms less severe.  You Must read complete instructions/literature along with all the possible adverse reactions/side effects for all the Medicines you take and that have been prescribed to you. Take any new Medicines after you have completely understood and accpet all the possible adverse reactions/side effects.   Please note  You were cared for by a hospitalist during your hospital stay. If you have any questions about your discharge medications or the care you received while you were in the hospital after you are discharged, you can call the unit and asked to speak with the hospitalist on call if the hospitalist that took care of you is not available. Once you are discharged, your primary care physician will handle any further medical issues. Please note that NO REFILLS for any discharge medications will be authorized once you are discharged, as it is imperative that you return to your primary care physician (or establish a relationship with a primary care physician if you do not have one) for your aftercare needs so that they can reassess your need for medications and monitor your lab values.  Continue taking Pradaxa until you run out of medication. Then switch to Eliquis. Please see information below on each medication.  Information on my medicine - Pradaxa (dabigatran)  This medication  education was reviewed with me or my healthcare representative as part of my discharge preparation.  The pharmacist that spoke with me during my hospital stay was:  Ramond Dial, [facility]  Why was Pradaxa prescribed for you? Pradaxa was prescribed for you to reduce the risk of forming blood clots that cause a stroke if you have a medical condition called atrial fibrillation (a type of irregular heartbeat).    What do you Need to know about PradAXa? Take your Pradaxa TWICE DAILY - one capsule in the morning and one tablet in the evening with or without food.  It would be best to take the doses about the same time each day.  The capsules should not be broken, chewed or opened - they must be swallowed whole.  Do not store Pradaxa in other medication containers - once the bottle is opened the Pradaxa should be used within FOUR months; throw away any capsules that havent been by that time.  Take Pradaxa exactly as prescribed by your doctor.  DO NOT stop taking Pradaxa without talking to the doctor who prescribed the medication.  Stopping without other stroke prevention medication to take the place of Pradaxa may increase your risk of developing a clot that causes a stroke.  Refill your prescription before you run out.  After discharge, you should have regular check-up appointments with your healthcare provider that is prescribing your Pradaxa.  In the future your dose may need to be changed if your kidney function or weight changes by a significant amount.  What do you do if you miss a dose? If you miss a  dose, take it as soon as you remember on the same day.  If your next dose is less than 6 hours away, skip the missed dose.  Do not take two doses of PRADAXA at the same time.  Important Safety Information A possible side effect of Pradaxa is bleeding. You should call your healthcare provider right away if you experience any of the following: ? Bleeding from an injury or your nose that does  not stop. ? Unusual colored urine (red or dark brown) or unusual colored stools (red or black). ? Unusual bruising for unknown reasons. ? A serious fall or if you hit your head (even if there is no bleeding).  Some medicines may interact with Pradaxa and might increase your risk of bleeding or clotting while on Pradaxa. To help avoid this, consult your healthcare provider or pharmacist prior to using any new prescription or non-prescription medications, including herbals, vitamins, non-steroidal anti-inflammatory drugs (NSAIDs) and supplements.  This website has more information on Pradaxa (dabigatran): https://www.pradaxa.com    Information on my medicine - ELIQUIS (apixaban)  This medication education was reviewed with me or my healthcare representative as part of my discharge preparation.  The pharmacist that spoke with me during my hospital stay was:  Ramond Dial, [facility]  Why was Eliquis prescribed for you? Eliquis was prescribed for you to reduce the risk of a blood clot forming that can cause a stroke if you have a medical condition called atrial fibrillation (a type of irregular heartbeat).  What do You need to know about Eliquis ? Take your Eliquis TWICE DAILY - one tablet in the morning and one tablet in the evening with or without food. If you have difficulty swallowing the tablet whole please discuss with your pharmacist how to take the medication safely.  Take Eliquis exactly as prescribed by your doctor and DO NOT stop taking Eliquis without talking to the doctor who prescribed the medication.  Stopping may increase your risk of developing a stroke.  Refill your prescription before you run out.  After discharge, you should have regular check-up appointments with your healthcare provider that is prescribing your Eliquis.  In the future your dose may need to be changed if your kidney function or weight changes by a significant amount or as you get older.  What do you  do if you miss a dose? If you miss a dose, take it as soon as you remember on the same day and resume taking twice daily.  Do not take more than one dose of ELIQUIS at the same time to make up a missed dose.  Important Safety Information A possible side effect of Eliquis is bleeding. You should call your healthcare provider right away if you experience any of the following: ? Bleeding from an injury or your nose that does not stop. ? Unusual colored urine (red or dark brown) or unusual colored stools (red or black). ? Unusual bruising for unknown reasons. ? A serious fall or if you hit your head (even if there is no bleeding).  Some medicines may interact with Eliquis and might increase your risk of bleeding or clotting while on Eliquis. To help avoid this, consult your healthcare provider or pharmacist prior to using any new prescription or non-prescription medications, including herbals, vitamins, non-steroidal anti-inflammatory drugs (NSAIDs) and supplements.  This website has more information on Eliquis (apixaban): http://www.eliquis.com/eliquis/home

## 2016-05-19 NOTE — Progress Notes (Signed)
Report called to [place] at [facility] and EMS called for transport.

## 2016-05-19 NOTE — Progress Notes (Signed)
[place] for electrolyte management   Pharmacy consulted for electrolyte management for 76 yo male admitted with PNA/Afib. Patient is currently off of CRRT (stopped 3/19)  Plan:  K 3.8, Ca 8.6, Mg 1.5, phos 2.9. Will give magnesium sulfate 2 gm IV x 1 and recheck electrolytes tomorrow with AM labs.   Allergies  Allergen Reactions  . Ciprofloxacin     Other reaction(s): Other (See Comments) Pt states he could hardly move  . Sitagliptin     Other reaction(s): Other (See Comments) Weakness    Patient Measurements: Height: 5\' 9"  (175.3 cm) Weight: 212 lb 11.2 oz (96.5 kg) IBW/kg (Calculated) : 70.7   Vital Signs: Temp: 98.3 F (36.8 C) (03/26 0511) Temp Source: Oral (03/26 0511) BP: 167/83 (03/26 0511) Pulse Rate: 86 (03/26 0511) Intake/Output from previous day: 03/25 0701 - 03/26 0700 In: 640 [P.O.:540] Out: 1150 [Urine:1150] Intake/Output from this shift: No intake/output data recorded.  Labs:  Recent Labs  05/17/16 0542 05/18/16 0550 05/19/16 0445  WBC 22.3* 17.3* 15.4*  HGB 11.7* 12.6* 11.8*  HCT 35.1* 38.4* 35.6*  PLT 130* 139* 132*  CREATININE 1.86* 1.68* 1.64*  MG  --   --  1.5*  PHOS 3.8 3.3 2.9  ALBUMIN 2.5* 2.7* 2.7*   Estimated Creatinine Clearance: 44.6 mL/min (A) (by C-G formula based on SCr of 1.64 mg/dL (H)).  Lab Results  Component Value Date   K 3.8 05/19/2016     Pharmacy will continue to monitor and adjust per consult.   Laural Benes, [facility] Clinical Pharmacist 05/19/2016 7:31 AM

## 2016-05-19 NOTE — Discharge Summary (Signed)
[name] at [facility], 76 y.o., DOB 1940/09/18, MRN [id number]. Admission date: 05/08/2016 Discharge Date 05/19/2016 Primary MD [name], MD Admitting Physician [name], MD  Admission Diagnosis  Atrial fibrillation with rapid ventricular response [name]) [I48.91] Concussion without loss of consciousness, initial encounter [S06.0X0A] Abrasion of face, initial encounter [S00.81XA] Sepsis, due to unspecified organism [name]) [A41.9] Aspiration pneumonia of left lower lobe, unspecified aspiration pneumonia type ([name]) [J69.0] Community acquired pneumonia of left lung, unspecified part of lung [J18.9]  Discharge Diagnosis   Principal Problem:   Sepsis ([name]) due to multifocal pna  Possible due to aspiration   Acute on chronic diastolic chf   Chronic atrial fibrillation ([name])   Acute renal failure due to ATN   Coronary artery disease involving native coronary artery of native heart without angina pectoris   Aspiration pneumonia ([name])   Atrial fibrillation with RVR ([name])   Hypokalemia   Gastroenteritis   AKI (acute kidney injury) ([name])   Hypomagnesemia   Leuctosis   Essential hypertension   Demand ischemia ([name])   Acute respiratory failure with hypoxia [name])         Hospital Course  The patient is 76 year old Caucasian male with past medical history significant for history of chronic atrial fibrillation, CHF, coronary artery disease, diabetes, hypertension, hyperlipidemia, melanoma, who presented to the hospital due to weakness and fall, nonproductive cough, few episodes of nausea and vomiting. Patient was brought to emergency room where he was found to be in A. fib, RVR, chest x-ray revealed left lower lobe pneumonia, he was noted to be leukopenic, had elevated lactic acid level and was admitted. He was noted to have acute kidney injury requiring CRRT, patient's renal function started to improve his CRRT was  stopped. Patient also was restarted back on lasix. Patient continue require oxygen requirement and can be weaned off this in the near future. Patient is very deconditioned and in need of rehabilitation.           Consults  cardiology, pulmonary  Significant Tests:  See full reports for all details     Ct Head Wo Contrast  Result Date: 05/08/2016 CLINICAL DATA:  Unwitnessed fall.  Head abrasion. EXAM: CT HEAD WITHOUT CONTRAST CT CERVICAL SPINE WITHOUT CONTRAST TECHNIQUE: Multidetector CT imaging of the head and cervical spine was performed following the standard protocol without intravenous contrast. Multiplanar CT image reconstructions of the cervical spine were also generated. COMPARISON:  None. FINDINGS: CT HEAD FINDINGS Brain: No evidence of acute infarction, hemorrhage, hydrocephalus, extra-axial collection or mass lesion/mass effect. Moderate low-density in the cerebral white matter attributed to chronic microvascular ischemia. Vascular: Atherosclerotic calcification. Skull: Bubbles of gas in the right infratemporal and temporal fossa is intravenous. No fracture noted. Sinuses/Orbits: Generalized mild mucosal thickening. CT CERVICAL SPINE FINDINGS Alignment: No traumatic malalignment. Facet mediated anterolisthesis at C4-5, C5-6, C6-7, and C7-T1. Skull base and vertebrae: Negative for acute fracture. Soft tissues and spinal canal: No prevertebral fluid or swelling. No visible canal hematoma. Disc levels: Diffuse advanced facet arthropathy bulky spurring and multilevel listhesis. Milder disc degeneration. Advanced atlantodental degeneration with anterior arch thinning. Upper chest: No acute finding IMPRESSION: 1. No evidence of intracranial or cervical spine injury. 2. Advanced cervical spine degeneration. 3. Chronic microvascular disease in the cerebral white matter. Electronically Signed   By: [name]   On: 05/08/2016 09:42   Ct Chest Wo Contrast  Result Date:  05/17/2016 CLINICAL DATA:  76 year old male with  a history of pneumonia EXAM: CT CHEST WITHOUT CONTRAST TECHNIQUE: Multidetector CT imaging of the chest was performed following the standard protocol without IV contrast. COMPARISON:  CT 05/08/2016 FINDINGS: Cardiovascular: Since the prior CT there has been development of trace pericardial fluid/ thickening. Unchanged heart size. Calcifications of the aortic valve.  Aortic atherosclerosis. Calcifications of left main, left anterior descending, circumflex, right coronary artery. Mediastinum/Nodes: Similar distribution and number of mediastinal lymph nodes, though since the prior there has been decreasing size throughout. Lungs/Pleura: Compare to prior there is improved confluent airspace opacity of the left upper lobe and left lower lobe, however, persisting mixed ground-glass and nodular opacity in similar distribution. New small left and right pleural effusions. Bronchial wall thickening. No interlobular septal thickening. Upper Abdomen: No acute abnormality. Musculoskeletal: No displaced fracture. Degenerative changes of the spine. IMPRESSION: Since the prior CT 05/08/2016, there has been partial clearing of left upper lobe and left lower lobe airspace disease, compatible with improving multifocal pneumonia. Interval development of small bilateral pleural effusions. Interval development of trace pericardial fluid/thickening. Bilateral bronchial wall thickening, likely inflammatory. Re- demonstration of left main and 3 vessel coronary artery disease, aortic valve calcifications, and aortic atherosclerosis. Electronically Signed   By: [name]   On: 05/17/2016 11:55   Ct Chest Wo Contrast  Result Date: 05/08/2016 CLINICAL DATA:  Sepsis, pneumonia, hypotension, rapid atrial fibrillation, history hypertension, melanoma, diabetes mellitus, CHF, former smoker EXAM: CT CHEST WITHOUT CONTRAST TECHNIQUE: Multidetector CT imaging of the chest was performed  following the standard protocol without IV contrast. Sagittal and coronal MPR images reconstructed from axial data set. COMPARISON:  Chest radiograph 05/08/2016 FINDINGS: Cardiovascular: Atherosclerotic calcifications aorta, coronary arteries, and proximal great vessels. Upper normal caliber thoracic aorta without aneurysmal dilatation. No pericardial effusion. Mediastinum/Nodes: Scattered normal size lymph nodes at base of cervical region and retroclavicular. Probable segmental dilatation of the LEFT subclavian vein versus less likely an adjacent mildly enlarged lymph node. Scattered normal sized mediastinal lymph nodes otherwise seen. Esophagus unremarkable. Lungs/Pleura: Extensive airspace consolidation LEFT upper and LEFT lower lobes consistent with pneumonia. Mild scattered central infiltrates in RIGHT lower lobe. Underlying emphysematous changes and mild central peribronchial thickening. No definite pulmonary mass or nodule no abnormalities in LEFT lung could be obscured by infiltrate. No pleural effusion or pneumothorax. Upper Abdomen: Beam hardening artifacts secondary to the patient's arms traverse the upper abdomen. No definite upper abdominal abnormalities. Musculoskeletal: No acute osseous findings. IMPRESSION: Extensive airspace consolidation within the LEFT upper and LEFT lower lobes consistent with pneumonia. Minimal patchy central RIGHT lower lobe infiltrates as well. Aortic atherosclerosis and coronary arterial calcification. Electronically Signed   By: Lavonia Dana M.D.   On: 05/08/2016 18:41   Ct Cervical Spine Wo Contrast  Result Date: 05/08/2016 CLINICAL DATA:  Unwitnessed fall.  Head abrasion. EXAM: CT HEAD WITHOUT CONTRAST CT CERVICAL SPINE WITHOUT CONTRAST TECHNIQUE: Multidetector CT imaging of the head and cervical spine was performed following the standard protocol without intravenous contrast. Multiplanar CT image reconstructions of the cervical spine were also generated. COMPARISON:   None. FINDINGS: CT HEAD FINDINGS Brain: No evidence of acute infarction, hemorrhage, hydrocephalus, extra-axial collection or mass lesion/mass effect. Moderate low-density in the cerebral white matter attributed to chronic microvascular ischemia. Vascular: Atherosclerotic calcification. Skull: Bubbles of gas in the right infratemporal and temporal fossa is intravenous. No fracture noted. Sinuses/Orbits: Generalized mild mucosal thickening. CT CERVICAL SPINE FINDINGS Alignment: No traumatic malalignment. Facet mediated anterolisthesis at C4-5, C5-6, C6-7, and C7-T1. Skull base and vertebrae:  Negative for acute fracture. Soft tissues and spinal canal: No prevertebral fluid or swelling. No visible canal hematoma. Disc levels: Diffuse advanced facet arthropathy bulky spurring and multilevel listhesis. Milder disc degeneration. Advanced atlantodental degeneration with anterior arch thinning. Upper chest: No acute finding IMPRESSION: 1. No evidence of intracranial or cervical spine injury. 2. Advanced cervical spine degeneration. 3. Chronic microvascular disease in the cerebral white matter. Electronically Signed   By: [name]   On: 05/08/2016 09:42   Dg Chest Port 1 View  Result Date: 05/15/2016 CLINICAL DATA:  Respiratory failure and hypoxia EXAM: PORTABLE CHEST 1 VIEW COMPARISON:  Chest radiograph 05/11/2016 FINDINGS: Unchanged enlarged cardiomediastinal silhouette with calcific aortic arch atherosclerosis. Airspace opacities in the left lung are unchanged. Persistent right basilar atelectasis. No pneumothorax or sizable pleural effusion. IMPRESSION: Cardiomegaly and unchanged left lung opacities. Electronically Signed   By: Ulyses Jarred M.D.   On: 05/15/2016 05:19   Dg Chest Port 1 View  Result Date: 05/11/2016 CLINICAL DATA:  Acute respiratory failure. EXAM: PORTABLE CHEST 1 VIEW COMPARISON:  05/10/2016, 05/08/2016 and chest CT 05/08/2016 FINDINGS: Tubing apparatus over the upper left chest of  uncertain clinical significance. Lungs are adequately inflated and demonstrate persistent airspace process over the left midlung with slight worsening airspace density in the right base. Cardiomediastinal silhouette and remainder the exam is unchanged. IMPRESSION: Persistent bilateral airspace process left worse than right likely ongoing infection. Electronically Signed   By: Marin Olp M.D.   On: 05/11/2016 07:37   Dg Chest Port 1 View  Result Date: 05/10/2016 CLINICAL DATA:  Sepsis EXAM: PORTABLE CHEST 1 VIEW COMPARISON:  05/09/2016 FINDINGS: Shallow inspiration. Cardiac enlargement. Persistent consolidation in the left middle lung consistent with pneumonia. Developing atelectasis or infiltration in the right lung base. No pneumothorax. No blunting of costophrenic angles. Calcification of the aorta. IMPRESSION: No change in left mid lung consolidation. Developing infiltration or atelectasis in the right lung base. Cardiac enlargement. Electronically Signed   By: Lucienne Capers M.D.   On: 05/10/2016 21:47   Dg Chest Port 1 View  Result Date: 05/09/2016 CLINICAL DATA:  Pneumonia, respiratory failure, atrial fibrillation and congestive heart failure. EXAM: PORTABLE CHEST 1 VIEW COMPARISON:  Chest x-ray and CT studies on 05/08/2016 FINDINGS: Airspace consolidation within the left upper lobe and lower lobe is partially masked by an overlying temporary pacing pad. Overall distribution likely similar to the chest x-ray yesterday. No overt edema or pleural effusion identified. Stable cardiac enlargement. No pneumothorax. IMPRESSION: Stable overall distribution of left lung airspace consolidation. Appearance by chest x-ray is today partially masked by an overlying temporary pacing pad. Electronically Signed   By: Aletta Edouard M.D.   On: 05/09/2016 09:51   Dg Chest Port 1 View  Result Date: 05/08/2016 CLINICAL DATA:  76 year old male with vomiting since 1815 hours yesterday. Syncope this morning. Initial  encounter. EXAM: PORTABLE CHEST 1 VIEW COMPARISON:  None. FINDINGS: Portable AP semi upright view at 0915 hours. Confluent abnormal pulmonary opacity throughout the mid and lower left lung, sparing the left costophrenic angle. No definite pleural effusion. Stable cardiomegaly and mediastinal contours. Calcified aortic atherosclerosis. Visualized tracheal air column is within normal limits. The right lung appears clear when allowing for portable technique. No pneumothorax. Negative visible bowel gas pattern. No acute osseous abnormality identified. IMPRESSION: 1. Confluent left lung opacity compatible with aspiration and/or pneumonia in this clinical setting. No associated pleural effusion. 2. Mild cardiomegaly.  Calcified aortic atherosclerosis. Electronically Signed   By: Lemmie Evens  [place] Crane M.D.   On: 05/08/2016 09:42       Today   Subjective:   Todd Valentine patient feeling better shortness of breath improved  Objective:   Blood pressure (!) 167/83, pulse 86, temperature 98.3 F (36.8 C), temperature source Oral, resp. rate 16, height 5\' 9"  (1.753 m), weight 212 lb 11.2 oz (96.5 kg), SpO2 94 %.  .  Intake/Output Summary (Last 24 hours) at 05/19/16 1213 Last data filed at 05/19/16 1027  Gross per 24 hour  Intake              640 ml  Output             1150 ml  Net             -510 ml    Exam VITAL SIGNS: Blood pressure (!) 167/83, pulse 86, temperature 98.3 F (36.8 C), temperature source Oral, resp. rate 16, height 5\' 9"  (1.753 m), weight 212 lb 11.2 oz (96.5 kg), SpO2 94 %.  GENERAL:  76 y.o.-year-old patient lying in the bed with no acute distress.  EYES: Pupils equal, round, reactive to light and accommodation. No scleral icterus. Extraocular muscles intact.  HEENT: Head atraumatic, normocephalic. Oropharynx and nasopharynx clear.  NECK:  Supple, no jugular venous distention. No thyroid enlargement, no tenderness.  LUNGS: Normal breath sounds bilaterally, no wheezing, rales,rhonchi or  crepitation. No use of accessory muscles of respiration.  CARDIOVASCULAR: S1, S2 normal. No murmurs, rubs, or gallops.  ABDOMEN: Soft, nontender, nondistended. Bowel sounds present. No organomegaly or mass.  EXTREMITIES: No pedal edema, cyanosis, or clubbing.  NEUROLOGIC: Cranial nerves II through XII are intact. Muscle strength 5/5 in all extremities. Sensation intact. Gait not checked.  PSYCHIATRIC: The patient is alert and oriented x 3.  SKIN: No obvious rash, lesion, or ulcer.   Data Review     CBC w Diff: Lab Results  Component Value Date   WBC 15.4 (H) 05/19/2016   HGB 11.8 (L) 05/19/2016   HGB 14.1 10/03/2013   HCT 35.6 (L) 05/19/2016   HCT 42.4 10/03/2013   PLT 132 (L) 05/19/2016   PLT 168 10/03/2013   LYMPHOPCT 7 05/16/2016   LYMPHOPCT 22.0 10/03/2013   BANDSPCT 3 05/16/2016   MONOPCT 5 05/16/2016   MONOPCT 10.4 10/03/2013   EOSPCT 0 05/16/2016   EOSPCT 1.7 10/03/2013   BASOPCT 0 05/16/2016   BASOPCT 0.5 10/03/2013   CMP: Lab Results  Component Value Date   NA 144 05/19/2016   NA 143 08/07/2014   NA 134 (L) 10/06/2013   K 3.8 05/19/2016   K 3.2 (L) 10/06/2013   CL 103 05/19/2016   CL 99 10/06/2013   CO2 35 (H) 05/19/2016   CO2 29 10/06/2013   BUN 33 (H) 05/19/2016   BUN 20 08/07/2014   BUN 30 (H) 10/06/2013   CREATININE 1.64 (H) 05/19/2016   CREATININE 1.56 (H) 10/06/2013   PROT 5.9 (L) 05/08/2016   PROT 7.0 10/03/2013   ALBUMIN 2.7 (L) 05/19/2016   ALBUMIN 3.2 (L) 10/03/2013   BILITOT 3.2 (H) 05/08/2016   BILITOT 1.4 (H) 10/03/2013   ALKPHOS 42 05/08/2016   ALKPHOS 63 10/03/2013   AST 43 (H) 05/08/2016   AST 44 (H) 10/03/2013   ALT 24 05/08/2016   ALT 62 10/03/2013  .  Micro Results Recent Results (from the past 240 hour(s))  Culture, expectorated sputum-assessment     Status: None   Collection Time: 05/10/16  3:53 AM  Result Value Ref  Range Status   Specimen Description SPUTUM  Final   Special Requests NONE  Final   Sputum evaluation    Final    Sputum specimen not acceptable for testing.  Please recollect.   C/DALE HOPKINS AT 2248 05/10/16.PMH   Report Status 05/10/2016 FINAL  Final        Code Status Orders        Start     Ordered   05/09/16 1116  Limited resuscitation (code)  Continuous    Question Answer Comment  In the event of cardiac or respiratory ARREST: Initiate Code Blue, Call Rapid Response Yes   In the event of cardiac or respiratory ARREST: Perform CPR Yes   In the event of cardiac or respiratory ARREST: Perform Intubation/Mechanical Ventilation No   In the event of cardiac or respiratory ARREST: Use NIPPV/BiPAp only if indicated Yes   In the event of cardiac or respiratory ARREST: Administer ACLS medications if indicated Yes   In the event of cardiac or respiratory ARREST: Perform Defibrillation or Cardioversion if indicated Yes      05/09/16 1116    Code Status History    Date Active Date Inactive Code Status Order ID Comments User Context   05/09/2016  9:16 AM 05/09/2016 11:16 AM DNR [id number]  Hillary Bow, MD Inpatient   05/08/2016  2:18 PM 05/09/2016  9:16 AM Full Code [id number]  [name], MD Inpatient    Advance Directive Documentation     Most Recent Value  Type of Advance Directive  Healthcare Power of [place]  Pre-existing out of facility DNR order (yellow form or pink MOST form)  -  "MOST" Form in Place?  -          Follow-up Information    [name], MD. Go on 06/04/2016.   Specialty:  Family Medicine Why:  Appointment Time: 9:40am Contact information: [place] [address] [phone]        Kathlyn Sacramento, MD. Go on 06/03/2016.   Specialty:  Cardiology Why:  Appointment Time: 8:00AM Contact information: [place] [address] [phone]           Discharge Medications   Allergies as of 05/19/2016      Reactions   Ciprofloxacin    Other reaction(s): Other (See Comments) Pt states he could  hardly move   Sitagliptin    Other reaction(s): Other (See Comments) Weakness      Medication List    STOP taking these medications   apixaban 5 MG Tabs tablet Commonly known as:  ELIQUIS   diltiazem 120 MG 24 hr capsule Commonly known as:  DILACOR XR   lisinopril-hydrochlorothiazide 20-12.5 MG tablet Commonly known as:  PRINZIDE,ZESTORETIC   metformin 1000 MG (OSM) 24 hr tablet Commonly known as:  FORTAMET     TAKE these medications   acetaminophen 500 MG tablet Commonly known as:  TYLENOL Take 1,000 mg by mouth every 6 (six) hours as needed.   budesonide 0.5 MG/2ML nebulizer solution Commonly known as:  PULMICORT Take 2 mLs (0.5 mg total) by nebulization 2 (two) times daily.   carvedilol 25 MG tablet Commonly known as:  COREG Take 1 tablet (25 mg total) by mouth 2 (two) times daily with a meal. What changed:  medication strength  how much to take   cefUROXime 500 MG tablet Commonly known as:  CEFTIN Take 1 tablet (500 mg total) by mouth 2 (two) times daily with a meal.  chlorhexidine 0.12 % solution Commonly known as:  PERIDEX 15 mLs by Mouth Rinse route 2 (two) times daily.   diltiazem 240 MG 24 hr capsule Commonly known as:  CARDIZEM CD Take 1 capsule (240 mg total) by mouth daily. Start taking on:  05/20/2016   famotidine 20 MG tablet Commonly known as:  PEPCID Take 1 tablet (20 mg total) by mouth at bedtime.   furosemide 40 MG tablet Commonly known as:  LASIX Take 1 tablet (40 mg total) by mouth 2 (two) times daily. What changed:  See the new instructions.   glimepiride 4 MG tablet Commonly known as:  AMARYL Take 4 mg by mouth 2 (two) times daily.   guaiFENesin 100 MG/5ML Soln Commonly known as:  ROBITUSSIN Take 20 mLs (400 mg total) by mouth 3 (three) times daily.   hydrALAZINE 50 MG tablet Commonly known as:  APRESOLINE Take 1 tablet (50 mg total) by mouth every 8 (eight) hours.   ipratropium-albuterol 0.5-2.5 (3) MG/3ML Soln Commonly  known as:  DUONEB Take 3 mLs by nebulization 3 (three) times daily.   magnesium oxide 400 MG tablet Commonly known as:  MAG-OX Take 400 mg by mouth daily.   nitroGLYCERIN 0.4 MG SL tablet Commonly known as:  NITROSTAT Place 1 tablet (0.4 mg total) under the tongue every 5 (five) minutes as needed for chest pain.   ondansetron 4 MG tablet Commonly known as:  ZOFRAN Take 1 tablet (4 mg total) by mouth every 8 (eight) hours as needed for nausea or vomiting.   potassium chloride 10 MEQ tablet Commonly known as:  K-DUR TAKE ONE TABLET BY MOUTH TWICE DAILY AS NEEDED   PRADAXA 150 MG Caps capsule Generic drug:  dabigatran TAKE ONE CAPSULE BY MOUTH TWICE DAILY   simvastatin 40 MG tablet Commonly known as:  ZOCOR TAKE ONE TABLET BY MOUTH AT BEDTIME   tamsulosin 0.4 MG Caps capsule Commonly known as:  FLOMAX Take 0.4 mg by mouth daily.   traZODone 50 MG tablet Commonly known as:  DESYREL Take 50 mg by mouth at bedtime as needed for sleep.          Total Time in preparing paper work, data evaluation and todays exam - 35 minutes  Dustin Flock M.D on 05/19/2016 at 12:13 PM  [facility]   Office  [phone]

## 2016-05-19 NOTE — Progress Notes (Signed)
Pt. Has a flat red spotty rash to entire back, pt states it does itch. Rash cleansed. Lotion applied.

## 2016-05-19 NOTE — Care Management Important Message (Signed)
Important Message  Patient Details  Name: Todd Valentine MRN: [id number] Date of Birth: 09-12-1940   Medicare Important Message Given:  Yes signed IM printed from Epic and given to patient.    Katrina Stack, RN 05/19/2016, 1:20 PM

## 2016-05-19 NOTE — Progress Notes (Signed)
Progress Note  Patient Name: Todd Valentine Date of Encounter: 05/19/2016  Primary Cardiologist: Dr. Rockey Situ  Subjective   Todd Valentine continues to feel better every day.He continues to be in atrial fibrillation with rapid ventricular response.  Inpatient Medications    Scheduled Meds: . atorvastatin  40 mg Oral q1800  . budesonide (PULMICORT) nebulizer solution  0.5 mg Nebulization BID  . carvedilol  25 mg Oral BID WC  . cefUROXime  500 mg Oral BID WC  . chlorhexidine  15 mL Mouth Rinse BID  . dabigatran  150 mg Oral Q12H  . diltiazem  240 mg Oral Daily  . doxycycline  100 mg Oral Q12H  . famotidine  20 mg Oral QHS  . furosemide  40 mg Oral BID  . guaiFENesin  20 mL Oral TID  . haloperidol lactate  2.5 mg Intravenous Once  . hydrALAZINE  50 mg Oral Q8H  . insulin aspart  0-5 Units Subcutaneous QHS  . insulin aspart  0-9 Units Subcutaneous TID WC  . ipratropium-albuterol  3 mL Nebulization TID  . magnesium sulfate 1 - 4 g bolus IVPB  2 g Intravenous Once  . mouth rinse  15 mL Mouth Rinse q12n4p  . sodium chloride flush  3 mL Intravenous Q12H  . tamsulosin  0.4 mg Oral Daily   Continuous Infusions:  PRN Meds: acetaminophen **OR** acetaminophen, albuterol, bisacodyl, labetalol, lip balm, morphine injection, ondansetron **OR** ondansetron (ZOFRAN) IV, promethazine, traZODone   Vital Signs    Vitals:   05/18/16 1930 05/18/16 1954 05/19/16 0511 05/19/16 0713  BP:  (!) 156/70 (!) 167/83   Pulse:  79 86   Resp:  17 16   Temp:  98 F (36.7 C) 98.3 F (36.8 C)   TempSrc:  Oral Oral   SpO2: 95% 97% 94% 94%  Weight:   212 lb 11.2 oz (96.5 kg)   Height:        Intake/Output Summary (Last 24 hours) at 05/19/16 0836 Last data filed at 05/19/16 0645  Gross per 24 hour  Intake              640 ml  Output             1150 ml  Net             -510 ml   Filed Weights   05/17/16 0450 05/18/16 0449 05/19/16 0511  Weight: 224 lb 4.8 oz (101.7 kg) 222 lb 9.6 oz (101  kg) 212 lb 11.2 oz (96.5 kg)    Telemetry    Atrial fibrillation. Rate 80s to 140s.  Mostly between 100-120 bpm. - Personally Reviewed  ECG   n/a  Physical Exam   GEN: well-appearing.  No acute distress.   Neck: Jugular venous pressure is not well visualized. Cardiac: Irregularly irregular. 2/6 holosystolic murmur at the apex. No rubs, or gallops.  Respiratory: Clear to auscultation bilaterally. GI: Soft, nontender, non-distended  MS:  No deformity. Trace leg edema bilaterally. Neuro:  Nonfocal  Psych: Normal affect   Labs    Chemistry  Recent Labs Lab 05/17/16 0542 05/18/16 0550 05/19/16 0445  NA 141 141 144  K 3.5 3.2* 3.8  CL 104 102 103  CO2 30 33* 35*  GLUCOSE 147* 136* 170*  BUN 54* 40* 33*  CREATININE 1.86* 1.68* 1.64*  CALCIUM 8.4* 8.4* 8.6*  ALBUMIN 2.5* 2.7* 2.7*  GFRNONAA 34* 38* 39*  GFRAA 39* 44* 46*  ANIONGAP 7 6 6  Hematology  Recent Labs Lab 05/17/16 0542 05/18/16 0550 05/19/16 0445  WBC 22.3* 17.3* 15.4*  RBC 3.99* 4.28* 4.02*  HGB 11.7* 12.6* 11.8*  HCT 35.1* 38.4* 35.6*  MCV 88.0 89.7 88.6  MCH 29.5 29.4 29.5  MCHC 33.5 32.8 33.3  RDW 14.8* 14.9* 14.9*  PLT 130* 139* 132*    Cardiac Enzymes No results for input(s): TROPONINI in the last 168 hours. No results for input(s): TROPIPOC in the last 168 hours.   BNPNo results for input(s): BNP, PROBNP in the last 168 hours.   DDimer No results for input(s): DDIMER in the last 168 hours.   Radiology    Ct Chest Wo Contrast  Result Date: 05/17/2016 CLINICAL DATA:  76 year old male with a history of pneumonia EXAM: CT CHEST WITHOUT CONTRAST TECHNIQUE: Multidetector CT imaging of the chest was performed following the standard protocol without IV contrast. COMPARISON:  CT 05/08/2016 FINDINGS: Cardiovascular: Since the prior CT there has been development of trace pericardial fluid/ thickening. Unchanged heart size. Calcifications of the aortic valve.  Aortic atherosclerosis.  Calcifications of left main, left anterior descending, circumflex, right coronary artery. Mediastinum/Nodes: Similar distribution and number of mediastinal lymph nodes, though since the prior there has been decreasing size throughout. Lungs/Pleura: Compare to prior there is improved confluent airspace opacity of the left upper lobe and left lower lobe, however, persisting mixed ground-glass and nodular opacity in similar distribution. New small left and right pleural effusions. Bronchial wall thickening. No interlobular septal thickening. Upper Abdomen: No acute abnormality. Musculoskeletal: No displaced fracture. Degenerative changes of the spine. IMPRESSION: Since the prior CT 05/08/2016, there has been partial clearing of left upper lobe and left lower lobe airspace disease, compatible with improving multifocal pneumonia. Interval development of small bilateral pleural effusions. Interval development of trace pericardial fluid/thickening. Bilateral bronchial wall thickening, likely inflammatory. Re- demonstration of left main and 3 vessel coronary artery disease, aortic valve calcifications, and aortic atherosclerosis. Electronically Signed   By: [name]   On: 05/17/2016 11:55    Cardiac Studies   Echo 05/08/16: Study Conclusions  - Left ventricle: The cavity size was mildly reduced. There was   mild concentric hypertrophy. Systolic function was normal. The   estimated ejection fraction was in the range of 50% to 55%.   Regional wall motion abnormalities cannot be excluded. The study   is not technically sufficient to allow evaluation of LV diastolic   function. - Mitral valve: There was mild regurgitation. - Left atrium: The atrium was mildly dilated. - Right ventricle: The cavity size was mildly dilated. Wall   thickness was normal. Systolic function was mildly reduced. - Pulmonary arteries: Systolic pressure could not be accurately   estimated.  Patient Profile     76 y.o.  male with chronic atrial fibrillation here with pneumonia, sepsis, and acute renal failure.  Assessment & Plan    1. Chronic atrial fibrillation:  Heart rate continues to be not optimally controlled. He is currently on maximal dose carvedilol 25 mg twice daily. I elected to increase diltiazem extended release to 240 mg once daily. Continue anticoagulation with dabigatran as the patient has one month supply left at home . After he is done with that, he will switch to Eliquis 5 mg twice daily.   2. Essential hypertension:   Blood pressure continues to be mildly elevated. I'll increase diltiazem as outlined above. I discontinued amlodipine to avoid being on 2 different calcium channel blockers.   3. Acute on chronic diastolic  heart failure:  volume status improved. Continue Furosemide 40 mg po bid .  4. Hyperlipidemia: Continue  atorvastatin.  5. Acute renal function: Likely 2/2 sepsis and ATN.  Renal function continues to improve. Continue to hold his home ACE inhibitor and HCTZ.   Signed, Kathlyn Sacramento, MD  05/19/2016, 8:36 AM

## 2016-05-20 DIAGNOSIS — J9601 Acute respiratory failure with hypoxia: Secondary | ICD-10-CM

## 2016-05-20 DIAGNOSIS — I2510 Atherosclerotic heart disease of native coronary artery without angina pectoris: Secondary | ICD-10-CM

## 2016-05-20 DIAGNOSIS — N390 Urinary tract infection, site not specified: Secondary | ICD-10-CM | POA: Diagnosis not present

## 2016-05-20 DIAGNOSIS — I482 Chronic atrial fibrillation: Secondary | ICD-10-CM | POA: Diagnosis not present

## 2016-05-20 DIAGNOSIS — J181 Lobar pneumonia, unspecified organism: Secondary | ICD-10-CM | POA: Diagnosis not present

## 2016-05-20 DIAGNOSIS — E119 Type 2 diabetes mellitus without complications: Secondary | ICD-10-CM | POA: Diagnosis not present

## 2016-05-31 NOTE — Progress Notes (Signed)
Cardiology Office Note  Date:  06/03/2016   ID:  Todd Valentine, DOB Jan 23, 1941, MRN [id number]  PCP:  [name], MD   Chief Complaint  Patient presents with  . other    Follow up from [facility]. Meds reviewed by the pt's med list from [facility]. "doing well."     HPI:  [name] is a very pleasant 76 year old gentleman with  long history of smoking for 30-40 years,  alcohol use,  obesity,  chronic diastolic CHF,Echo March 4315 with ejection fraction 50-55% chronic atrial fibrillation,  hypertension,  type 2 diabetes,  Significant coronary disease by catheterization August 2015  three-vessel disease sleep apnea, uses his CPAP periodically presenting for follow-up of his atrial fibrillation Lives alone with supportive family after wife unexpectedly passed away  Hospital admission 05/08/16 for PNA,sepsis, atrial fib with [facility] records reviewed with the patient And family in detail On today's visit On bipap For many days, ABX. Declined ventilator if needed acute kidney injury/ATN,  requiring CRRT Diltiazem dose increased at d/c up to 240 daily D/c on pradaxa (was going to change to eliquis) Off lisinopril/HCTZ, metformin  Labs at d/c  CR 1.64, BUN 33  In follow-up today he is recovering at [facility] rehabilitation Worsening of his lower extremity edema He reports 14 pound weight loss, walking with a walker Overall feels well, no congestion, sleeping flat Reports blood pressures have been well-controlled when measured at rehabilitation, numbers not available today. No potassium being given, was written for as needed only Recent lab work reviewed with him from 05/28/2016 showing creatinine 1.76, BUN 23, potassium 3.8, albumin 1.7  Medication changes made While he was in the hospital include high-dose diltiazem 240 mill grams daily, carvedilol up to 25, hydralazine added, lisinopril HCTZ held Hydralazine previously held for orthostasisIn 2017 Currently on  Lasix 40 twice a day. Previously was on 20 twice a day  Prior to recent [place] part-time delivers auto parts No regular exercise program   total cholesterol well controlled, 130 or less  EKG on today's visit shows atrial fibrillation with rate 74 bpm, right bundle branch block  Other past medical history hospital admission 10/02/2013 with discharge August 12. He presented with shortness of breath, Atrial fibrillation with RVR. Echocardiogram showed normal ejection fraction. Symptoms improved with diuresis.  Echocardiogram 10/02/2013 showing ejection fraction 55-60%, mildly dilated left atrium, mild to moderate MR and TR  He had cardiac catheterization 10/06/2013 showing occluded diagonal vessel, 40% proximal LAD disease, bifurcating marginal branch/ramus. Ramus had 60% followed by 80% disease, OM with 50% mid disease at least. RCA with 40% mid disease. Medical management recommended  hemoglobin A1c 7.1 up from 6.9 Previously was on simvastatin. Currently not on simvastatin for uncertain reasons. On the simvastatin, total cholesterol was 139, LDL 50  Prior weight at the time of hospital discharge was 218 pounds, now he reports 228 pounds at home, 230 pounds in our clinic  PMH:   has a past medical history of Acute renal failure ([place]); BPH (benign prostatic hyperplasia); Chronic atrial fibrillation ([place]); Chronic diastolic CHF (congestive heart failure) ([place]); Chronic insomnia; Coronary artery disease, non-occlusive (10/06/2013); Depression; Diabetes mellitus with complication ([place]); History of diverticulitis; Hyperkalemia; Hyperlipidemia; Hypertension; Malignant melanoma ([place]); Mitral regurgitation; and Sleep apnea in adult.  PSH:    Past Surgical History:  Procedure Laterality Date  . APPENDECTOMY    . CARDIAC CATHETERIZATION  10/06/2013  . COLONOSCOPY    . COLONOSCOPY WITH PROPOFOL N/A 11/23/2014   Procedure:  COLONOSCOPY WITH PROPOFOL;  Surgeon: Manya Silvas, MD;  Location: [facility] ENDOSCOPY;  Service: Endoscopy;  Laterality: N/A;  . MOHS SURGERY    . removal tubular adenoma      Current Outpatient Prescriptions  Medication Sig Dispense Refill  . acetaminophen (TYLENOL) 500 MG tablet Take 1,000 mg by mouth every 6 (six) hours as needed.    [name] atorvastatin (LIPITOR) 20 MG tablet Take 20 mg by mouth daily.    . carvedilol (COREG) 25 MG tablet Take 1 tablet (25 mg total) by mouth 2 (two) times daily with a meal.    . diltiazem (CARDIZEM CD) 120 MG 24 hr capsule Take 1 capsule (120 mg total) by mouth daily.    . famotidine (PEPCID) 20 MG tablet Take 1 tablet (20 mg total) by mouth at bedtime.    . furosemide (LASIX) 40 MG tablet Take 1 tablet (40 mg total) by mouth 2 (two) times daily. 30 tablet   . glimepiride (AMARYL) 4 MG tablet Take 4 mg by mouth 2 (two) times daily.     . hydrALAZINE (APRESOLINE) 50 MG tablet Take 1 tablet (50 mg total) by mouth every 8 (eight) hours. 120 tablet 0  . ipratropium-albuterol (DUONEB) 0.5-2.5 (3) MG/3ML SOLN Take 3 mLs by nebulization 3 (three) times daily. 360 mL   . magnesium oxide (MAG-OX) 400 MG tablet Take 400 mg by mouth daily.    . nitroGLYCERIN (NITROSTAT) 0.4 MG SL tablet Place 1 tablet (0.4 mg total) under the tongue every 5 (five) minutes as needed for chest pain. 25 tablet 3  . nystatin (NYSTATIN) powder Apply topically 4 (four) times daily.    . ondansetron (ZOFRAN) 4 MG tablet Take 1 tablet (4 mg total) by mouth every 8 (eight) hours as needed for nausea or vomiting. 20 tablet 0  . potassium chloride (K-DUR) 10 MEQ tablet Take 1 tablet (10 mEq total) by mouth daily. 90 tablet 3  . PRADAXA 150 MG CAPS capsule TAKE ONE CAPSULE BY MOUTH TWICE DAILY 180 capsule 3  . tamsulosin (FLOMAX) 0.4 MG CAPS capsule Take 0.4 mg by mouth daily.     . traZODone (DESYREL) 50 MG tablet Take 50 mg by mouth at bedtime as needed for sleep.     No current facility-administered medications for this visit.       Allergies:   Ciprofloxacin and Sitagliptin   Social History:  The patient  reports that he has quit smoking. He has never used smokeless tobacco. He reports that he drinks alcohol. He reports that he does not use drugs.   Family History:   Family history is unknown by patient.    Review of Systems: Review of Systems  Constitutional: Negative.   Respiratory: Negative.   Cardiovascular: Positive for leg swelling.  Gastrointestinal: Negative.   Musculoskeletal: Negative.        Gait instability  Neurological: Negative.   Psychiatric/Behavioral: Negative.   All other systems reviewed and are negative.    PHYSICAL EXAM: VS:  BP 100/60   Ht 5\' 9"  (1.753 m)   Wt 203 lb (92.1 kg)   BMI 29.98 kg/m  , BMI Body mass index is 29.98 kg/m. GEN: Well nourished, well developed, in no acute distress , obese HEENT: normal  Neck: no JVD, carotid bruits, or masses Cardiac: Irregularly irregular; no murmurs, rubs, or gallops,1+ pitting edema on the left above the sock line, trace pitting edema on the right  Respiratory:  clear to auscultation bilaterally, normal work of breathing  GI: soft, nontender, nondistended, + BS MS: no deformity or atrophy  Skin: warm and dry, no rash Neuro:  Strength and sensation are intact Psych: euthymic mood, full affect    Recent Labs: 05/08/2016: ALT 24 05/11/2016: B Natriuretic Peptide 459.0 05/19/2016: BUN 33; Creatinine, Ser 1.64; Hemoglobin 11.8; Magnesium 1.5; Platelets 132; Potassium 3.8; Sodium 144    Lipid Panel Lab Results  Component Value Date   CHOL 119 02/28/2014   HDL 44 02/28/2014   LDLCALC 55 02/28/2014   TRIG 99 02/28/2014      Wt Readings from Last 3 Encounters:  06/03/16 203 lb (92.1 kg)  05/19/16 212 lb 11.2 oz (96.5 kg)  05/07/16 217 lb (98.4 kg)       ASSESSMENT AND PLAN:   Chronic atrial fibrillation ([place]) - Plan: EKG 12-Lead We'll continue pradaxa for now until he runs out then we will switch to eliquis at his  request We'll decrease diltiazem down to 120 m daily given his worsening leg swelling Likely side effect of the calcium channel blocker. Also blood pressure low this morning  Essential hypertension Blood pressure low Today. Systolic pressure 295 on nurses measurement, 110 on my measurement. We will decrease diltiazem from 240 down to 120  Acute on chronic diastolic CHF (congestive heart failure) ([place]) - Plan: EKG 12-Lead  recommended he continue to take Lasix40 mg daily For weight less than 200 pounds in rehabilitation or at home recommended he decrease Lasix down to 20 minute grams twice a day  Coronary artery disease involving native coronary artery of native heart without angina pectoris - Plan: EKG 12-Lead Currently with no symptoms of angina. No further workup at this time. Continue current medication regimen.We have recommended he continue his Lipitor  Mixed hyperlipidemia - Plan: EKG 12-Lead Cholesterol is at goal on the current lipid regimen. No changes to the medications were made.  Type 2 diabetes mellitus with complication, without long-term current use of insulin ([place]) - Plan: EKG 12-Lead Dramatic weight loss while he was in the hospital, continues to lose weight in rehabilitation  Shortness of breath - Plan: EKG 12-Lead Denies any significant shortness of breath. We'll continue Lasix twice a day for now with close monitoring  Acute on chronic renal failure ATN while in the hospital in the setting of sepsis Improved down to baseline 1.6 - 1.7 In the past several weeks Follow-up with nephrology next week. Dr. Holley Raring  [facility] records reviewed in detail ATN, required respiratory support, broad spectrum antibiotics, medications for blood pressure and heart rate support given rapid atrial fibrillation  Issues above discussed in length with patient and family in the room today Medication changes made and discussed in detail Paperwork filled out for nursing  facility  Total encounter time more than 45 minutes  Greater than 50% was spent in counseling and coordination of care with the patient   Disposition:   F/U  2 months   Orders Placed This Encounter  Procedures  . EKG 12-Lead     Signed, Esmond Plants, M.D., Ph.D. 06/03/2016  [place], [place]

## 2016-06-03 ENCOUNTER — Ambulatory Visit (INDEPENDENT_AMBULATORY_CARE_PROVIDER_SITE_OTHER): Payer: Medicare Other | Admitting: Cardiovascular Disease

## 2016-06-03 ENCOUNTER — Telehealth: Payer: Self-pay | Admitting: Cardiovascular Disease

## 2016-06-03 ENCOUNTER — Encounter: Payer: Self-pay | Admitting: Cardiovascular Disease

## 2016-06-03 VITALS — BP 100/60 | Ht 69.0 in | Wt 203.0 lb

## 2016-06-03 DIAGNOSIS — J9601 Acute respiratory failure with hypoxia: Secondary | ICD-10-CM

## 2016-06-03 DIAGNOSIS — I482 Chronic atrial fibrillation, unspecified: Secondary | ICD-10-CM

## 2016-06-03 DIAGNOSIS — E782 Mixed hyperlipidemia: Secondary | ICD-10-CM

## 2016-06-03 DIAGNOSIS — J181 Lobar pneumonia, unspecified organism: Secondary | ICD-10-CM | POA: Diagnosis not present

## 2016-06-03 DIAGNOSIS — N179 Acute kidney failure, unspecified: Secondary | ICD-10-CM

## 2016-06-03 DIAGNOSIS — Z87891 Personal history of nicotine dependence: Secondary | ICD-10-CM | POA: Diagnosis not present

## 2016-06-03 DIAGNOSIS — J189 Pneumonia, unspecified organism: Secondary | ICD-10-CM

## 2016-06-03 DIAGNOSIS — A419 Sepsis, unspecified organism: Secondary | ICD-10-CM

## 2016-06-03 DIAGNOSIS — I10 Essential (primary) hypertension: Secondary | ICD-10-CM | POA: Diagnosis not present

## 2016-06-03 DIAGNOSIS — I2510 Atherosclerotic heart disease of native coronary artery without angina pectoris: Secondary | ICD-10-CM | POA: Diagnosis not present

## 2016-06-03 DIAGNOSIS — I5033 Acute on chronic diastolic (congestive) heart failure: Secondary | ICD-10-CM

## 2016-06-03 MED ORDER — DILTIAZEM HCL ER COATED BEADS 120 MG PO CP24: 120.0000 mg | ORAL_CAPSULE | Freq: Every day | ORAL | Status: DC

## 2016-06-03 MED ORDER — POTASSIUM CHLORIDE ER 10 MEQ PO TBCR: 10.0000 meq | EXTENDED_RELEASE_TABLET | Freq: Every day | ORAL | 3 refills | Status: DC

## 2016-06-03 NOTE — Telephone Encounter (Signed)
Spoke w/ Denyse Amass.  Clarified w/ her that pt is to be on K+ 10 meq. Asked her to call back w/ any further questions or concerns.

## 2016-06-03 NOTE — Telephone Encounter (Signed)
Pt caretaker at [facility] has a question regarding Potassium rx. Please call.

## 2016-06-03 NOTE — Patient Instructions (Addendum)
Medication Instructions:   Please decrease the diltiazem down to 120 mg daily  Please take one potassium a day  For weight less than 200 pounds, Decrease the lasix down to 20 mg twice a day Otherwise for weight > 200, stay on lasix 40 mg twice a day  Please check weight and blood pressure when you get home  Labwork:  No new labs needed  Testing/Procedures:  No further testing at this time   I recommend watching educational videos on topics of interest to you at:       [email or web address].goemmi.com  Enter code: [email or web address]    Follow-Up: It was a pleasure seeing you in the office today. Please call us if you have new issues that need to be addressed before your next appt.  [phone]  Your physician wants you to follow-up in: 2 months.    If you need a refill on your cardiac medications before your next appointment, please call your pharmacy.

## 2016-06-04 ENCOUNTER — Telehealth: Payer: Self-pay | Admitting: Cardiovascular Disease

## 2016-06-04 NOTE — Telephone Encounter (Signed)
Lowest available would be fine 10 to 20 or 15 to 20 thx TG

## 2016-06-04 NOTE — Telephone Encounter (Signed)
Pt daughter calling stating she needs to know what compression does patient need to get  Pt was advised he get compression socks but when she went online to order there are different kinds.  15-20 or 20-30, and some more Please advise

## 2016-06-05 NOTE — Telephone Encounter (Signed)
Spoke with patient and reviewed Dr. Donivan Scull recommendations to try the 10-20 or 15-20  and he verbalized understanding with no further questions at this time.

## 2016-06-10 DIAGNOSIS — R601 Generalized edema: Secondary | ICD-10-CM | POA: Diagnosis not present

## 2016-06-10 DIAGNOSIS — R600 Localized edema: Secondary | ICD-10-CM | POA: Diagnosis not present

## 2016-06-11 ENCOUNTER — Telehealth: Payer: Self-pay | Admitting: Cardiovascular Disease

## 2016-06-11 ENCOUNTER — Other Ambulatory Visit: Payer: Self-pay | Admitting: *Deleted

## 2016-06-11 MED ORDER — DILTIAZEM HCL ER COATED BEADS 120 MG PO CP24: 120.0000 mg | ORAL_CAPSULE | Freq: Every day | ORAL | 3 refills | Status: DC

## 2016-06-11 MED ORDER — POTASSIUM CHLORIDE ER 10 MEQ PO TBCR: 10.0000 meq | EXTENDED_RELEASE_TABLET | Freq: Every day | ORAL | 3 refills | Status: DC

## 2016-06-11 NOTE — Telephone Encounter (Signed)
Pt daughter calling stating we called prescriptions to the wrong pharmacy    *STAT* If patient is at the pharmacy, call can be transferred to refill team.   1. Which medications need to be refilled? (please list name of each medication and dose if known)  Diltiazem and Potassium   2. Which pharmacy/location (including street and city if local pharmacy) is medication to be sent to?   [facility] in [place]   3. Do they need a 30 day or 90 day supply? 90 day

## 2016-06-11 NOTE — Telephone Encounter (Signed)
Requested Prescriptions   Signed Prescriptions Disp Refills  . diltiazem (CARDIZEM CD) 120 MG 24 hr capsule 90 capsule 3    Sig: Take 1 capsule (120 mg total) by mouth daily.    Authorizing Provider: Minna Merritts    Ordering User: Eugenio Hoes, MARINA C  . potassium chloride (K-DUR) 10 MEQ tablet 90 tablet 3    Sig: Take 1 tablet (10 mEq total) by mouth daily.    Authorizing Provider: Minna Merritts    Ordering User: Britt Bottom

## 2016-06-11 NOTE — Telephone Encounter (Signed)
Requested Prescriptions   Signed Prescriptions Disp Refills  . diltiazem (CARDIZEM CD) 120 MG 24 hr capsule 90 capsule 3    Sig: Take 1 capsule (120 mg total) by mouth daily.    Authorizing Provider: Minna Merritts    Ordering User: Eugenio Hoes, Aliana Kreischer C  . potassium chloride (K-DUR) 10 MEQ tablet 90 tablet 3    Sig: Take 1 tablet (10 mEq total) by mouth daily.    Authorizing Provider: Minna Merritts    Ordering User: Britt Bottom

## 2016-06-12 DIAGNOSIS — I10 Essential (primary) hypertension: Secondary | ICD-10-CM | POA: Diagnosis not present

## 2016-06-12 DIAGNOSIS — E119 Type 2 diabetes mellitus without complications: Secondary | ICD-10-CM | POA: Diagnosis not present

## 2016-06-12 DIAGNOSIS — K219 Gastro-esophageal reflux disease without esophagitis: Secondary | ICD-10-CM | POA: Diagnosis not present

## 2016-06-12 DIAGNOSIS — E7800 Pure hypercholesterolemia, unspecified: Secondary | ICD-10-CM | POA: Diagnosis not present

## 2016-06-12 DIAGNOSIS — N400 Enlarged prostate without lower urinary tract symptoms: Secondary | ICD-10-CM | POA: Diagnosis not present

## 2016-06-27 DIAGNOSIS — D0439 Carcinoma in situ of skin of other parts of face: Secondary | ICD-10-CM | POA: Diagnosis not present

## 2016-06-27 DIAGNOSIS — L570 Actinic keratosis: Secondary | ICD-10-CM | POA: Diagnosis not present

## 2016-06-27 DIAGNOSIS — Z85820 Personal history of malignant melanoma of skin: Secondary | ICD-10-CM | POA: Diagnosis not present

## 2016-06-27 DIAGNOSIS — Z08 Encounter for follow-up examination after completed treatment for malignant neoplasm: Secondary | ICD-10-CM | POA: Diagnosis not present

## 2016-06-27 DIAGNOSIS — D485 Neoplasm of uncertain behavior of skin: Secondary | ICD-10-CM | POA: Diagnosis not present

## 2016-06-27 DIAGNOSIS — Z85828 Personal history of other malignant neoplasm of skin: Secondary | ICD-10-CM | POA: Diagnosis not present

## 2016-06-27 DIAGNOSIS — X32XXXA Exposure to sunlight, initial encounter: Secondary | ICD-10-CM | POA: Diagnosis not present

## 2016-06-27 DIAGNOSIS — C4441 Basal cell carcinoma of skin of scalp and neck: Secondary | ICD-10-CM | POA: Diagnosis not present

## 2016-07-03 ENCOUNTER — Telehealth: Payer: Self-pay | Admitting: Cardiovascular Disease

## 2016-07-04 NOTE — Telephone Encounter (Signed)
RE: Secured Todd P Thrall  Valentine, Todd  Reply   Yesterday, 9:50 PM  Whitney Post;  Dede Query  BP running a little high,  wonder if the hydralazine not holding it down.  this is three times a day, inconvenience and actually should be taken 4x a day as it lasts 6 hours.  We could try an alternate medication such as clonidine 0.1 mg twice a day  this can be titrated upwards to 0.2 mg twice a day if needed  I prefer eliquis if the price is right on the blood thinner. Safer when there is renal dysfunction as well.  Let me know what you think  TG    From: Whitney Post Sent: Wednesday, Jul 02, 2016 8:43:55 AM To: Esmond Plants Subject: FW: RE: Todd Valentine  Dr. Rockey Valentine,  I have listed Dad's Blood Pressure below.I know at his 04-25-16 appointment you said that when is Pradaxa ran out you would like for him to switch to Eliquis. I know a lot has changed since that time with his hospital stay and rehab.Do you still want him to switch to Eliquis?  Since his last visit with you on 06-03-16.  1. Dr. Holley Raring changed his Furosemide to 40 MG by mouth daily on 06-10-16. Everything else stayed the same. His Creatinine was 1.46 Kidney Function was 46%  2. He saw Dr. Kym Groom on 06-12-16 no medication changes.  3. He has an appointment with Dr. Kym Groom again on 07-17-16 and Dr. Holley Raring on 07-22-16.  06-16-16  4:45 162/93  06-17-16  2:45 149/71  9:30 150/76  06-18-16  8:30 193/104  06-19-16  8:30 192/97  10:30 108/68  10:45 132/79  3:00 169/94  06-20-16  10:00 190/100  12:00 134/76  06-21-16  12:30 154/89  06-22-16  9:15 173/106  3:15 130/79  06/23/16  10:00 148/88  3:45 153/81  06-24-16  9:15 162/87  11:00 146/74  06-25-16  148/86  06-26-16  10:30 140/78  9:00 151/74  06-27-16  150/71  06-29-16  2:20 151/95  06-30-16  1:00 117/68  [employer] [facility] [employer] [facility]  Physician Compensation  Phone [phone]  Fax [phone]  "Don't forget that you can find reports, dashboards and other resources at [place], a single, easy-to-access site."   From: Esmond Plants  Sent: Tuesday, June 17, 2016 9:10 PM To: Whitney Post [email or web address]> Subject: Re: RE: Secured Todd Valentine  Numbers look good,  thx  TG   From: Whitney Post Sent: Monday, June 16, 2016 8:23:00 AM To: Esmond Plants Subject: RE: RE: Todd Valentine  Dr. Haydee Monica P. Kwan  BP Thursday 06-12-16  9:00 140/75  2:45 146/86  5:30 148/82 at Dr. Devona Konig Office  BP Friday 06-13-16  10:00 148/93  4:30 157/79  BP Saturday 06-14-16  Morning 113/66  8:45 162/89  BP Sunday 06-15-16  Morning 146/73  [employer] [facility]  Physician Compensation  Phone [phone]  Fax [phone]  "Don't forget that you can find reports, dashboards and other resources at [place], a single, easy-to-access site."   From: Esmond Plants  Sent: Wednesday, June 11, 2016 4:00 PM To: Whitney Post [email or web address]> Subject: Re: RE: Secured Todd Valentine  great thx,  numbers look good  TG   From: Whitney Post Sent: Wednesday, June 11, 2016 3:47:21 PM To: Esmond Plants Subject: RE: RE:  Secured Todd Valentine  Dr. Rockey Valentine,  You are correct.  For weight less than 200 pounds,  lasix 20 mg twice a day  for weight > 200, on lasix 40 mg twice a day  He is on diltiazem 120 mg 1 capsule by mouth daily  He is on carvedilol 25 mg 1 tablet twice a day  He is on hydralazine 50 mg 1 tablet three times a day  We were following the list you gave [place] on 06-03-16 until yesterday when he saw Dr. Holley Raring.  When he saw Dr. Holley Raring yesterday 06-10-16 at 9:30 am his blood pressure was 139/73  Dr. Holley Raring reduced his Furosemide to 40 mg by mouth daily instead of taking it based on his weight.  Dad checked his BP today 06-11-16  12:00 pm 139/83  2:30 pm 145/77  Dad said he  would continue to check it today and tomorrow and we would e-mail you on Friday.  He is also going to see Dr. Kym Groom tomorrow at 5:20 pm  [employer] [facility]  Physician Compensation  Phone [phone]  Fax [phone]  "Don't forget that you can find reports, dashboards and other resources at [place], a single, easy-to-access site."   From: Esmond Plants  Sent: Wednesday, June 11, 2016 1:11 PM To: Whitney Post @[email or web address]> Subject: Re: RE: Secured Todd Valentine  his blood pressure looks high.  if you do not mind perhaps let me know which medications he is taking  need to make sure his list at home is what we have in the clinic  I decreased his diltiazem down to 120 on the last clinic visit as blood pressure was low  We may need to go back up to 240 which is what he was on for  For weight less than 200 pounds,  lasix 20 mg twice a day  for weight > 200, on lasix 40 mg twice a day  he was on carvedilol 25 twice a day  hydralazine 50mg  three times a day  thx  TGollan   From: Whitney Post Sent: Tuesday, June 10, 2016 8:06:13 AM To: Esmond Plants Subject: RE: Todd Valentine  Dr. Rockey Valentine,  Todd Valentine 09-26-2040 (Dad came home from [place] on Friday.)  He is going to see Dr. Milana Huntsman today 06-10-16 at 9:30 am and Dr. Kym Groom Thursday 06-12-16 at 5:20 pm  Blood Pressure  Sun. 06-08-16  3:24 pm 111/68  4:07 pm 125/65  6:50 pm 140/??  Mon. 06-09-16  9:37 am 153/89  11:30 am 174/101  12:20 pm 143/83  2:00 pm 168/84  10:00 pm 178/96  Tues. 06-10-16  7:00 am 174/104  7:40 am 154/100  [facility] [employer] [facility]  Physician Compensation  Phone [phone]  Fax [phone]  "Don't forget that you can find reports, dashboards and other resources at [place], a single, easy-to-access site."

## 2016-07-08 ENCOUNTER — Other Ambulatory Visit: Payer: Self-pay

## 2016-07-08 ENCOUNTER — Encounter: Payer: Self-pay | Admitting: Cardiovascular Disease

## 2016-07-08 MED ORDER — CLONIDINE HCL 0.1 MG PO TABS: 0.1000 mg | ORAL_TABLET | Freq: Two times a day (BID) | ORAL | 6 refills | Status: DC

## 2016-07-08 NOTE — Progress Notes (Signed)
clonid 

## 2016-07-15 ENCOUNTER — Other Ambulatory Visit: Payer: Self-pay

## 2016-07-15 MED ORDER — CLONIDINE HCL 0.1 MG PO TABS: 0.1000 mg | ORAL_TABLET | Freq: Two times a day (BID) | ORAL | 6 refills | Status: DC

## 2016-07-17 DIAGNOSIS — N400 Enlarged prostate without lower urinary tract symptoms: Secondary | ICD-10-CM | POA: Diagnosis not present

## 2016-07-17 DIAGNOSIS — I10 Essential (primary) hypertension: Secondary | ICD-10-CM | POA: Diagnosis not present

## 2016-07-17 DIAGNOSIS — K219 Gastro-esophageal reflux disease without esophagitis: Secondary | ICD-10-CM | POA: Diagnosis not present

## 2016-07-17 DIAGNOSIS — E7800 Pure hypercholesterolemia, unspecified: Secondary | ICD-10-CM | POA: Diagnosis not present

## 2016-07-17 DIAGNOSIS — Z125 Encounter for screening for malignant neoplasm of prostate: Secondary | ICD-10-CM | POA: Diagnosis not present

## 2016-07-17 DIAGNOSIS — I4891 Unspecified atrial fibrillation: Secondary | ICD-10-CM | POA: Diagnosis not present

## 2016-07-17 DIAGNOSIS — E876 Hypokalemia: Secondary | ICD-10-CM | POA: Diagnosis not present

## 2016-07-17 DIAGNOSIS — E119 Type 2 diabetes mellitus without complications: Secondary | ICD-10-CM | POA: Diagnosis not present

## 2016-07-22 DIAGNOSIS — N179 Acute kidney failure, unspecified: Secondary | ICD-10-CM | POA: Diagnosis not present

## 2016-07-22 DIAGNOSIS — I10 Essential (primary) hypertension: Secondary | ICD-10-CM | POA: Diagnosis not present

## 2016-07-22 DIAGNOSIS — R601 Generalized edema: Secondary | ICD-10-CM | POA: Diagnosis not present

## 2016-07-22 DIAGNOSIS — R600 Localized edema: Secondary | ICD-10-CM | POA: Diagnosis not present

## 2016-07-22 DIAGNOSIS — R809 Proteinuria, unspecified: Secondary | ICD-10-CM | POA: Diagnosis not present

## 2016-07-24 ENCOUNTER — Other Ambulatory Visit: Payer: Self-pay

## 2016-07-24 ENCOUNTER — Encounter: Payer: Self-pay | Admitting: Cardiovascular Disease

## 2016-07-24 MED ORDER — APIXABAN 5 MG PO TABS: 5.0000 mg | ORAL_TABLET | Freq: Two times a day (BID) | ORAL | 6 refills | Status: DC

## 2016-07-30 ENCOUNTER — Encounter: Payer: Self-pay | Admitting: Cardiovascular Disease

## 2016-08-04 ENCOUNTER — Ambulatory Visit: Payer: PRIVATE HEALTH INSURANCE | Admitting: Cardiovascular Disease

## 2016-08-05 DIAGNOSIS — C4441 Basal cell carcinoma of skin of scalp and neck: Secondary | ICD-10-CM | POA: Diagnosis not present

## 2016-08-05 DIAGNOSIS — L905 Scar conditions and fibrosis of skin: Secondary | ICD-10-CM | POA: Diagnosis not present

## 2016-08-11 ENCOUNTER — Encounter: Payer: Self-pay | Admitting: Cardiovascular Disease

## 2016-08-11 NOTE — Progress Notes (Signed)
Cardiology Office Note  Date:  08/12/2016   ID:  Todd Valentine, DOB 1941-01-04, MRN [id number]  PCP:  [name], MD   Chief Complaint  Patient presents with  . other    2 month follow up. Meds reviewed by the pt. verbally. "doing well."     HPI:  Todd Valentine is a very pleasant 76 year old gentleman with  long history of smoking for 30-40 years,  alcohol use,  obesity,  chronic diastolic CHF,Echo March 0938 with ejection fraction 50-55% chronic atrial fibrillation,  hypertension,  type 2 diabetes,  Significant coronary disease by catheterization August 2015  three-vessel disease sleep apnea, uses his CPAP periodically presenting for follow-up of his atrial fibrillation Lives alone with supportive family after wife unexpectedly passed away  In follow-up today he reports that he feels well,  Some low pressures occasionally High in the morning then seems to drop Occasionally has dizziness in the daytime Reports that he drinks lots of fluids,  Recently seen by Dr. Holley Raring and Lasix decreased down to 40 daily, takes potassium 10 Recent creatinine 1.5 with potassium 3.7 Takes most of his blood pressure medications in the morning including prostate medication Denies any significant chest pain, weight gain, leg swelling  He has fatigue, no regular exercise program since he left rehabilitation He is back working but sitting most of the time  EKG personally reviewed by myself on todays visit Shows atrial fibrillation with rate 71 bpm right bundle branch block  no change from previous EKGs   Other past medical history reviewed Hospital admission 05/08/16 for PNA,sepsis, atrial fib with [facility] records reviewed with the patient And family in detail On today's visit On bipap For many days, ABX. Declined ventilator if needed acute kidney injury/ATN,  requiring CRRT Diltiazem dose increased at d/c up to 240 daily D/c on pradaxa (was going to change to  eliquis) Off lisinopril/HCTZ, metformin  Labs at d/c  CR 1.64, BUN 33  total cholesterol well controlled, 130 or less  hospital admission 10/02/2013 with discharge August 12. He presented with shortness of breath, Atrial fibrillation with RVR. Echocardiogram showed normal ejection fraction. Symptoms improved with diuresis.  Echocardiogram 10/02/2013 showing ejection fraction 55-60%, mildly dilated left atrium, mild to moderate MR and TR  He had cardiac catheterization 10/06/2013 showing occluded diagonal vessel, 40% proximal LAD disease, bifurcating marginal branch/ramus. Ramus had 60% followed by 80% disease, OM with 50% mid disease at least. RCA with 40% mid disease. Medical management recommended  hemoglobin A1c 7.1 up from 6.9 Previously was on simvastatin. Currently not on simvastatin for uncertain reasons. On the simvastatin, total cholesterol was 139, LDL 50  Prior weight at the time of hospital discharge was 218 pounds, now he reports 228 pounds at home, 230 pounds in our clinic  PMH:   has a past medical history of Acute renal failure ([place]); BPH (benign prostatic hyperplasia); Chronic atrial fibrillation ([place]); Chronic diastolic CHF (congestive heart failure) ([place]); Chronic insomnia; Coronary artery disease, non-occlusive (10/06/2013); Depression; Diabetes mellitus with complication ([place]); History of diverticulitis; Hyperkalemia; Hyperlipidemia; Hypertension; Malignant melanoma ([place]); Mitral regurgitation; and Sleep apnea in adult.  PSH:    Past Surgical History:  Procedure Laterality Date  . APPENDECTOMY    . CARDIAC CATHETERIZATION  10/06/2013  . COLONOSCOPY    . COLONOSCOPY WITH PROPOFOL N/A 11/23/2014   Procedure: COLONOSCOPY WITH PROPOFOL;  Surgeon: Manya Silvas, MD;  Location: [facility] ENDOSCOPY;  Service: Endoscopy;  Laterality: N/A;  . MOHS SURGERY    .  removal tubular adenoma      Current Outpatient Prescriptions  Medication Sig Dispense Refill  .  acetaminophen (TYLENOL) 500 MG tablet Take 1,000 mg by mouth every 6 (six) hours as needed.    [name] apixaban (ELIQUIS) 5 MG TABS tablet Take 1 tablet (5 mg total) by mouth 2 (two) times daily. 60 tablet 6  . atorvastatin (LIPITOR) 20 MG tablet Take 20 mg by mouth daily.    . carvedilol (COREG) 25 MG tablet Take 1 tablet (25 mg total) by mouth 2 (two) times daily with a meal.    . cloNIDine (CATAPRES) 0.1 MG tablet Take 1 tablet (0.1 mg total) by mouth 2 (two) times daily. 60 tablet 6  . diltiazem (CARDIZEM CD) 120 MG 24 hr capsule Take 1 capsule (120 mg total) by mouth daily. 90 capsule 3  . famotidine (PEPCID) 20 MG tablet Take 1 tablet (20 mg total) by mouth at bedtime. AM    . furosemide (LASIX) 40 MG tablet Take 1 tablet (40 mg total) by mouth 2 (two) times daily. (Patient taking differently: Take 20 mg by mouth 2 (two) times daily. ) 30 tablet   . glimepiride (AMARYL) 4 MG tablet Take 4 mg by mouth 2 (two) times daily.     . magnesium oxide (MAG-OX) 400 MG tablet Take 400 mg by mouth daily.    . nitroGLYCERIN (NITROSTAT) 0.4 MG SL tablet Place 1 tablet (0.4 mg total) under the tongue every 5 (five) minutes as needed for chest pain. 25 tablet 3  . nystatin (NYSTATIN) powder Apply topically 4 (four) times daily.    . potassium chloride (K-DUR) 10 MEQ tablet Take 1 tablet (10 mEq total) by mouth daily. 90 tablet 3  . tamsulosin (FLOMAX) 0.4 MG CAPS capsule Take 0.4 mg by mouth daily.     . traZODone (DESYREL) 50 MG tablet Take 50 mg by mouth at bedtime as needed for sleep.     No current facility-administered medications for this visit.      Allergies:   Ciprofloxacin and Sitagliptin   Social History:  The patient  reports that he has quit smoking. He has never used smokeless tobacco. He reports that he drinks alcohol. He reports that he does not use drugs.   Family History:   Family history is unknown by patient.    Review of Systems: Review of Systems  Constitutional: Positive for  malaise/fatigue.  Respiratory: Negative.   Cardiovascular: Negative.   Gastrointestinal: Negative.   Musculoskeletal: Negative.        Gait instability  Neurological: Negative.   Psychiatric/Behavioral: Negative.   All other systems reviewed and are negative.    PHYSICAL EXAM: VS:  BP (!) 160/82   Pulse 71   Ht 5' 8.5" (1.74 m)   Wt 207 lb 8 oz (94.1 kg)   BMI 31.09 kg/m  , BMI Body mass index is 31.09 kg/m. GEN: Well nourished, well developed, in no acute distress , obese HEENT: normal  Neck: no JVD, carotid bruits, or masses Cardiac: Irregularly irregular; 1- 2+ SEM RSB no rubs, or gallops,1+ pitting edema on the left above the sock line, trace pitting edema on the right  Respiratory:  clear to auscultation bilaterally, normal work of breathing GI: soft, nontender, nondistended, + BS MS: no deformity or atrophy  Skin: warm and dry, no rash Neuro:  Strength and sensation are intact Psych: euthymic mood, full affect    Recent Labs: 05/08/2016: ALT 24 05/11/2016: B Natriuretic Peptide 459.0 05/19/2016: BUN  33; Creatinine, Ser 1.64; Hemoglobin 11.8; Magnesium 1.5; Platelets 132; Potassium 3.8; Sodium 144    Lipid Panel Lab Results  Component Value Date   CHOL 119 02/28/2014   HDL 44 02/28/2014   LDLCALC 55 02/28/2014   TRIG 99 02/28/2014      Wt Readings from Last 3 Encounters:  08/12/16 207 lb 8 oz (94.1 kg)  06/03/16 203 lb (92.1 kg)  05/19/16 212 lb 11.2 oz (96.5 kg)       ASSESSMENT AND PLAN:   Chronic atrial fibrillation ([name]) - Plan: EKG 12-Lead on eliquis, tolerating medication well on diltiazem 120 mg daily with good rate control  Recommended he monitor blood pressure during the daytime not just early morning  Essential hypertension Some labile blood pressure, recommended he take Flomax 30 minutes after dinner He was taking this on empty stomach in the morning with symptoms of dizziness in the daytime  Acute on chronic diastolic CHF (congestive  heart failure) ([place]) - Plan: EKG 12-Lead Lasix 20 twice a day We will increase his potassium up to 20  Weight essentially stable but does drink significant fluids daily  Mixed hyperlipidemia - Plan: EKG 12-Lead Cholesterol is at goal on the current lipid regimen. No changes to the medications were made.  Type 2 diabetes mellitus with complication, without long-term current use of insulin ([name]) - Plan: EKG 12-Lead Hemoglobin A1c up to 8, recommended low, carbohydrate diet   Acute on chronic renal failure ATN while in the hospital in the setting of sepsis Improved down to baseline 1.5 Follow-up with nephrology  Dr. Holley Raring    Total encounter time more than 25 minutes  Greater than 50% was spent in counseling and coordination of care with the patient   Disposition:   F/U  6 months   Orders Placed This Encounter  Procedures  . EKG 12-Lead     Signed, Esmond Plants, M.D., Ph.D. 08/12/2016  [place], [place]

## 2016-08-12 ENCOUNTER — Ambulatory Visit (INDEPENDENT_AMBULATORY_CARE_PROVIDER_SITE_OTHER): Payer: Medicare Other | Admitting: Cardiovascular Disease

## 2016-08-12 ENCOUNTER — Encounter: Payer: Self-pay | Admitting: Cardiovascular Disease

## 2016-08-12 VITALS — BP 160/82 | HR 71 | Ht 68.5 in | Wt 207.5 lb

## 2016-08-12 DIAGNOSIS — I482 Chronic atrial fibrillation, unspecified: Secondary | ICD-10-CM

## 2016-08-12 DIAGNOSIS — I2510 Atherosclerotic heart disease of native coronary artery without angina pectoris: Secondary | ICD-10-CM

## 2016-08-12 DIAGNOSIS — E118 Type 2 diabetes mellitus with unspecified complications: Secondary | ICD-10-CM | POA: Diagnosis not present

## 2016-08-12 DIAGNOSIS — J9601 Acute respiratory failure with hypoxia: Secondary | ICD-10-CM | POA: Diagnosis not present

## 2016-08-12 DIAGNOSIS — I5033 Acute on chronic diastolic (congestive) heart failure: Secondary | ICD-10-CM | POA: Diagnosis not present

## 2016-08-12 DIAGNOSIS — E782 Mixed hyperlipidemia: Secondary | ICD-10-CM

## 2016-08-12 MED ORDER — POTASSIUM CHLORIDE ER 20 MEQ PO TBCR: 20.0000 meq | EXTENDED_RELEASE_TABLET | Freq: Every day | ORAL | 3 refills | Status: DC

## 2016-08-12 NOTE — Patient Instructions (Addendum)
Medication Instructions:   Please increase the potassium up to 20 meq daily  Please move the flomax/tamsulosin to 30 min after dinner  Labwork:  No new labs needed  Testing/Procedures:  No further testing at this time   Follow-Up: It was a pleasure seeing you in the office today. Please call us if you have new issues that need to be addressed before your next appt.  [phone]  Your physician wants you to follow-up in: 6 months.  You will receive a reminder letter in the mail two months in advance. If you don't receive a letter, please call our office to schedule the follow-up appointment.  If you need a refill on your cardiac medications before your next appointment, please call your pharmacy.

## 2016-08-28 ENCOUNTER — Encounter: Payer: Self-pay | Admitting: Cardiovascular Disease

## 2016-08-28 ENCOUNTER — Other Ambulatory Visit: Payer: Self-pay

## 2016-08-28 MED ORDER — APIXABAN 5 MG PO TABS: 5.0000 mg | ORAL_TABLET | Freq: Two times a day (BID) | ORAL | 3 refills | Status: DC

## 2016-08-30 ENCOUNTER — Encounter: Payer: Self-pay | Admitting: Cardiovascular Disease

## 2016-09-16 DIAGNOSIS — D631 Anemia in chronic kidney disease: Secondary | ICD-10-CM | POA: Diagnosis not present

## 2016-09-16 DIAGNOSIS — I10 Essential (primary) hypertension: Secondary | ICD-10-CM | POA: Diagnosis not present

## 2016-09-16 DIAGNOSIS — N183 Chronic kidney disease, stage 3 (moderate): Secondary | ICD-10-CM | POA: Diagnosis not present

## 2016-09-16 DIAGNOSIS — R600 Localized edema: Secondary | ICD-10-CM | POA: Diagnosis not present

## 2016-09-16 DIAGNOSIS — R809 Proteinuria, unspecified: Secondary | ICD-10-CM | POA: Diagnosis not present

## 2016-09-16 DIAGNOSIS — R601 Generalized edema: Secondary | ICD-10-CM | POA: Diagnosis not present

## 2016-09-16 DIAGNOSIS — R631 Polydipsia: Secondary | ICD-10-CM | POA: Diagnosis not present

## 2016-09-29 DIAGNOSIS — Z23 Encounter for immunization: Secondary | ICD-10-CM | POA: Diagnosis not present

## 2016-10-16 DIAGNOSIS — N179 Acute kidney failure, unspecified: Secondary | ICD-10-CM | POA: Diagnosis not present

## 2016-10-20 DIAGNOSIS — E119 Type 2 diabetes mellitus without complications: Secondary | ICD-10-CM | POA: Diagnosis not present

## 2016-10-20 DIAGNOSIS — E7800 Pure hypercholesterolemia, unspecified: Secondary | ICD-10-CM | POA: Diagnosis not present

## 2016-10-20 DIAGNOSIS — I10 Essential (primary) hypertension: Secondary | ICD-10-CM | POA: Diagnosis not present

## 2016-10-27 ENCOUNTER — Encounter: Payer: Self-pay | Admitting: Cardiovascular Disease

## 2016-11-10 DIAGNOSIS — D0461 Carcinoma in situ of skin of right upper limb, including shoulder: Secondary | ICD-10-CM | POA: Diagnosis not present

## 2016-11-10 DIAGNOSIS — X32XXXA Exposure to sunlight, initial encounter: Secondary | ICD-10-CM | POA: Diagnosis not present

## 2016-11-10 DIAGNOSIS — D485 Neoplasm of uncertain behavior of skin: Secondary | ICD-10-CM | POA: Diagnosis not present

## 2016-11-10 DIAGNOSIS — C44619 Basal cell carcinoma of skin of left upper limb, including shoulder: Secondary | ICD-10-CM | POA: Diagnosis not present

## 2016-11-10 DIAGNOSIS — Z85828 Personal history of other malignant neoplasm of skin: Secondary | ICD-10-CM | POA: Diagnosis not present

## 2016-11-10 DIAGNOSIS — D0462 Carcinoma in situ of skin of left upper limb, including shoulder: Secondary | ICD-10-CM | POA: Diagnosis not present

## 2016-11-10 DIAGNOSIS — Z85820 Personal history of malignant melanoma of skin: Secondary | ICD-10-CM | POA: Diagnosis not present

## 2016-11-10 DIAGNOSIS — L570 Actinic keratosis: Secondary | ICD-10-CM | POA: Diagnosis not present

## 2016-11-11 DIAGNOSIS — H2512 Age-related nuclear cataract, left eye: Secondary | ICD-10-CM | POA: Diagnosis not present

## 2016-11-11 DIAGNOSIS — H25043 Posterior subcapsular polar age-related cataract, bilateral: Secondary | ICD-10-CM | POA: Diagnosis not present

## 2016-11-11 DIAGNOSIS — H02839 Dermatochalasis of unspecified eye, unspecified eyelid: Secondary | ICD-10-CM | POA: Diagnosis not present

## 2016-11-11 DIAGNOSIS — H25013 Cortical age-related cataract, bilateral: Secondary | ICD-10-CM | POA: Diagnosis not present

## 2016-11-11 DIAGNOSIS — H2513 Age-related nuclear cataract, bilateral: Secondary | ICD-10-CM | POA: Diagnosis not present

## 2016-11-17 ENCOUNTER — Encounter: Payer: Self-pay | Admitting: Cardiovascular Disease

## 2016-11-18 ENCOUNTER — Telehealth: Payer: Self-pay | Admitting: Cardiovascular Disease

## 2016-11-18 NOTE — Telephone Encounter (Signed)
[facility] [place] sent in request for cardiac clearance for patient to have Left Eye and then Right Eye on 01/05/17 and then again on 01/26/17 for cataract with intraocular lens implantation. Patient is on Eliquis as well. Clearance will need to be routed to Fax # [phone] attention [facility]. They report only topical anesthesia is used and that patient does not need to stop ANY medication. Last OV 08/12/16 and Echo 05/08/16.

## 2016-11-20 NOTE — Telephone Encounter (Signed)
Except or risk for procedures If they need him off anticoagulation would stop eliquis 2 days prior to the procedure Consider waiting on 24 hours following the procedure before restarting

## 2016-11-20 NOTE — Telephone Encounter (Signed)
Routed to number provided. 

## 2016-11-25 ENCOUNTER — Encounter: Payer: Self-pay | Admitting: Cardiovascular Disease

## 2016-11-25 DIAGNOSIS — Z23 Encounter for immunization: Secondary | ICD-10-CM | POA: Diagnosis not present

## 2016-11-27 ENCOUNTER — Other Ambulatory Visit: Payer: Self-pay | Admitting: *Deleted

## 2016-11-27 ENCOUNTER — Encounter: Payer: Self-pay | Admitting: Cardiovascular Disease

## 2016-11-27 MED ORDER — CLONIDINE HCL 0.1 MG PO TABS: 0.2000 mg | ORAL_TABLET | Freq: Two times a day (BID) | ORAL | 3 refills | Status: DC

## 2016-12-01 NOTE — Telephone Encounter (Signed)
Patient daughter wants to clarify medication change.  Please call to discuss  [phone] Butch Penny

## 2017-01-01 ENCOUNTER — Telehealth: Payer: Self-pay | Admitting: Cardiovascular Disease

## 2017-01-01 MED ORDER — NITROGLYCERIN 0.4 MG SL SUBL: 0.4000 mg | SUBLINGUAL_TABLET | SUBLINGUAL | 3 refills | Status: DC | PRN

## 2017-01-01 NOTE — Telephone Encounter (Signed)
He would increase the diltiazem up to 120 mg twice a day Monitor BP and heart rate

## 2017-01-01 NOTE — Telephone Encounter (Signed)
Spoke with patients daughter per release form and she reports that his blood pressures have been elevated. She provided the following readings below. Reviewed patients medications and he is taking 2 tablets of the clonidine twice daily along with other medications. Instructed her to have him take nitroglycerin under the tongue for SBP greater than 170 if needed. Instructed her to have him continue monitoring and that I would review this with Dr. Rockey Situ for recommendations. She verbalized understanding with no further questions at this time.   148/73 149/87 139/68 140/65 164/94 144/80 188/105 161/98 160/94

## 2017-01-01 NOTE — Telephone Encounter (Signed)
Pt c/o BP issue: STAT if pt c/o blurred vision, one-sided weakness or slurred speech  1. What are your last 5 BP readings?   Daughter doesn't have them now but patient has a t home     2. Are you having any other symptoms (ex. Dizziness, headache, blurred vision, passed out)?    None other than when he stands up from   sitting gets a little woozy   3. What is your BP issue?   Elevated bp

## 2017-01-02 MED ORDER — DILTIAZEM HCL ER COATED BEADS 120 MG PO CP24: 120.0000 mg | ORAL_CAPSULE | Freq: Two times a day (BID) | ORAL | 6 refills | Status: DC

## 2017-01-02 NOTE — Telephone Encounter (Signed)
Spoke with patients daughter per release form and instructed to increase diltiazem up to 120 mg twice daily and to have him monitor blood pressures and heart rate as well. She verbalized understanding with no further questions at this time.

## 2017-01-02 NOTE — Telephone Encounter (Signed)
No answer/No voicemail box set up.  

## 2017-01-02 NOTE — Telephone Encounter (Signed)
Pt daughter returning our call  °Please call back °  °

## 2017-01-05 DIAGNOSIS — H2512 Age-related nuclear cataract, left eye: Secondary | ICD-10-CM | POA: Diagnosis not present

## 2017-01-06 DIAGNOSIS — H2511 Age-related nuclear cataract, right eye: Secondary | ICD-10-CM | POA: Diagnosis not present

## 2017-01-14 ENCOUNTER — Encounter: Payer: Self-pay | Admitting: Cardiovascular Disease

## 2017-01-14 NOTE — Telephone Encounter (Signed)
Spoke with patients daughter Butch Penny per release form. Reviewed list of readings, medications, and discussed monitoring. Discussed some tips when he is checking blood pressures and to also bring in his home machine to upcoming appointment so we can compare readings. Advised her that I would route this message and readings over to Dr. Rockey Situ and would call back if he should have any recommendations prior to his upcoming appointment. She was very appreciative for the call back and had no further questions or concerns at this time.

## 2017-01-19 DIAGNOSIS — M9904 Segmental and somatic dysfunction of sacral region: Secondary | ICD-10-CM | POA: Diagnosis not present

## 2017-01-19 DIAGNOSIS — M545 Low back pain: Secondary | ICD-10-CM | POA: Diagnosis not present

## 2017-01-19 DIAGNOSIS — M9902 Segmental and somatic dysfunction of thoracic region: Secondary | ICD-10-CM | POA: Diagnosis not present

## 2017-01-19 DIAGNOSIS — M9903 Segmental and somatic dysfunction of lumbar region: Secondary | ICD-10-CM | POA: Diagnosis not present

## 2017-01-22 ENCOUNTER — Other Ambulatory Visit: Payer: Self-pay | Admitting: *Deleted

## 2017-01-22 MED ORDER — ISOSORBIDE MONONITRATE ER 30 MG PO TB24: 30.0000 mg | ORAL_TABLET | Freq: Every day | ORAL | 3 refills | Status: DC

## 2017-01-23 DIAGNOSIS — N183 Chronic kidney disease, stage 3 (moderate): Secondary | ICD-10-CM | POA: Diagnosis not present

## 2017-01-23 DIAGNOSIS — D631 Anemia in chronic kidney disease: Secondary | ICD-10-CM | POA: Diagnosis not present

## 2017-01-23 DIAGNOSIS — I10 Essential (primary) hypertension: Secondary | ICD-10-CM | POA: Diagnosis not present

## 2017-01-23 DIAGNOSIS — R809 Proteinuria, unspecified: Secondary | ICD-10-CM | POA: Diagnosis not present

## 2017-01-26 DIAGNOSIS — H2513 Age-related nuclear cataract, bilateral: Secondary | ICD-10-CM | POA: Diagnosis not present

## 2017-01-26 DIAGNOSIS — H2511 Age-related nuclear cataract, right eye: Secondary | ICD-10-CM | POA: Diagnosis not present

## 2017-01-26 NOTE — Telephone Encounter (Signed)
Daughter Butch Penny calling for patient  She was checking pt mychart messages and has questions about medication changes Please call for clarification

## 2017-01-26 NOTE — Telephone Encounter (Signed)
Spoke with patients daughter per release form and reviewed all blood pressure medications and the addition of new pill isosorbide mononitrate 30 mg once daily in the evening. She confirmed all medications and doses. Current blood pressure readings since last mychart message continue to be elevated and instructed her to have him continue monitoring and keep log. Advised that once he starts isosorbide mononitrate to send [place] those blood pressure numbers and confirmed upcoming appointment with Dr. Rockey Situ on 02/06/17 and instructed her to have him bring in his blood pressure machine as well so we can check it. She verbalized understanding of our conversation, agreement with plan, and she had no further questions at this time.

## 2017-02-05 DIAGNOSIS — M9902 Segmental and somatic dysfunction of thoracic region: Secondary | ICD-10-CM | POA: Diagnosis not present

## 2017-02-05 DIAGNOSIS — M9904 Segmental and somatic dysfunction of sacral region: Secondary | ICD-10-CM | POA: Diagnosis not present

## 2017-02-05 DIAGNOSIS — M9903 Segmental and somatic dysfunction of lumbar region: Secondary | ICD-10-CM | POA: Diagnosis not present

## 2017-02-05 DIAGNOSIS — M545 Low back pain: Secondary | ICD-10-CM | POA: Diagnosis not present

## 2017-02-05 NOTE — Progress Notes (Signed)
Cardiology Office Note  Date:  02/06/2017   ID:  ARNE SCHLENDER, DOB 11-24-1940, MRN [id number]  PCP:  [name], MD   Chief Complaint  Patient presents with  . other    6 month f/u c/o elevated BP. Meds reviewed verbally with pt.    HPI:  Mr. Bob is a very pleasant 76 year old gentleman with  long history of smoking for 30-40 years,  alcohol use,  obesity,  chronic diastolic CHF,Echo March 9678 with ejection fraction 50-55% chronic atrial fibrillation,  hypertension,  type 2 diabetes,  Significant coronary disease by catheterization August 2015  three-vessel disease sleep apnea, uses his CPAP periodically presenting for follow-up of his atrial fibrillation Lives alone with supportive family after wife unexpectedly passed away  In follow-up today he reports that he feels well,  Pressures were running high since his last clinic visit and isosorbide 30 mg daily was added Spreadsheet today by daughter shows elevated pressures typically in the morning On further discussion he is taking clonidine at 5 or 6 PM and 7 to 8 AM He has not had any dizziness, orthostasis symptoms Denies any significant chest pain, weight gain, leg swelling  Lab work reviewed showing creatinine 1.49 No regular exercise  EKG personally reviewed by myself on todays visit Shows atrial fibrillation with rate 62 bpm right bundle branch block  no change from previous EKGs   Other past medical history reviewed Hospital admission 05/08/16 for PNA,sepsis, atrial fib with RVR Hospital records reviewed with the patient And family in detail On today's visit On bipap For many days, ABX. Declined ventilator if needed acute kidney injury/ATN,  requiring CRRT Diltiazem dose increased at d/c up to 240 daily D/c on pradaxa (was going to change to eliquis) Off lisinopril/HCTZ, metformin  Labs at d/c  CR 1.64, BUN 33  total cholesterol well controlled, 130 or less  hospital admission  10/02/2013 with discharge August 12. He presented with shortness of breath, Atrial fibrillation with RVR. Echocardiogram showed normal ejection fraction. Symptoms improved with diuresis.  Echocardiogram 10/02/2013 showing ejection fraction 55-60%, mildly dilated left atrium, mild to moderate MR and TR  He had cardiac catheterization 10/06/2013 showing occluded diagonal vessel, 40% proximal LAD disease, bifurcating marginal branch/ramus. Ramus had 60% followed by 80% disease, OM with 50% mid disease at least. RCA with 40% mid disease. Medical management recommended  hemoglobin A1c 7.1 up from 6.9 Previously was on simvastatin. Currently not on simvastatin for uncertain reasons. On the simvastatin, total cholesterol was 139, LDL 50  Prior weight at the time of hospital discharge was 218 pounds, now he reports 228 pounds at home, 230 pounds in our clinic  PMH:   has a past medical history of Acute renal failure ([place]), BPH (benign prostatic hyperplasia), Chronic atrial fibrillation ([name]), Chronic diastolic CHF (congestive heart failure) ([place]), Chronic insomnia, Coronary artery disease, non-occlusive (10/06/2013), Depression, Diabetes mellitus with complication ([place]), History of diverticulitis, Hyperkalemia, Hyperlipidemia, Hypertension, Malignant melanoma ([place]), Mitral regurgitation, and Sleep apnea in adult.  PSH:    Past Surgical History:  Procedure Laterality Date  . APPENDECTOMY    . CARDIAC CATHETERIZATION  10/06/2013  . COLONOSCOPY    . COLONOSCOPY WITH PROPOFOL N/A 11/23/2014   Procedure: COLONOSCOPY WITH PROPOFOL;  Surgeon: Manya Silvas, MD;  Location: [facility] ENDOSCOPY;  Service: Endoscopy;  Laterality: N/A;  . MOHS SURGERY    . removal tubular adenoma      Current Outpatient Medications on File Prior to Visit  Medication Sig Dispense Refill  .  acetaminophen (TYLENOL) 500 MG tablet Take 1,000 mg by mouth every 6 (six) hours as needed.    [name] apixaban (ELIQUIS) 5 MG TABS tablet  Take 1 tablet (5 mg total) by mouth 2 (two) times daily. 180 tablet 3  . atorvastatin (LIPITOR) 20 MG tablet Take 20 mg by mouth daily.    . carvedilol (COREG) 25 MG tablet Take 1 tablet (25 mg total) by mouth 2 (two) times daily with a meal.    . cloNIDine (CATAPRES) 0.1 MG tablet Take 2 tablets (0.2 mg total) by mouth 2 (two) times daily. 360 tablet 3  . diltiazem (CARDIZEM CD) 120 MG 24 hr capsule Take 1 capsule (120 mg total) 2 (two) times daily by mouth. 60 capsule 6  . famotidine (PEPCID) 20 MG tablet Take 1 tablet (20 mg total) by mouth at bedtime.    . furosemide (LASIX) 20 MG tablet Take 20 mg by mouth daily.    [name] glimepiride (AMARYL) 4 MG tablet Take 4 mg by mouth 2 (two) times daily.     . isosorbide mononitrate (IMDUR) 30 MG 24 hr tablet Take 1 tablet (30 mg total) by mouth daily. 90 tablet 3  . magnesium oxide (MAG-OX) 400 MG tablet Take 400 mg by mouth daily.    . nitroGLYCERIN (NITROSTAT) 0.4 MG SL tablet Place 1 tablet (0.4 mg total) every 5 (five) minutes as needed under the tongue for chest pain. 25 tablet 3  . potassium chloride 20 MEQ TBCR Take 20 mEq by mouth daily. 90 tablet 3  . tamsulosin (FLOMAX) 0.4 MG CAPS capsule Take 0.4 mg by mouth daily.     . traZODone (DESYREL) 50 MG tablet Take 50 mg by mouth at bedtime as needed for sleep.     No current facility-administered medications on file prior to visit.     Allergies:   Ciprofloxacin and Sitagliptin   Social History:  The patient  reports that he has quit smoking. he has never used smokeless tobacco. He reports that he drinks alcohol. He reports that he does not use drugs.   Family History:   Family history is unknown by patient.    Review of Systems: Review of Systems  Constitutional: Negative.   Respiratory: Negative.   Cardiovascular: Negative.   Gastrointestinal: Negative.   Musculoskeletal: Negative.        Gait instability  Neurological: Negative.   Psychiatric/Behavioral: Negative.   All other  systems reviewed and are negative.    PHYSICAL EXAM: VS:  BP 110/60 (BP Location: Left Arm, Patient Position: Sitting, Cuff Size: Normal)   Pulse 62   Ht 5\' 9"  (1.753 m)   Wt 209 lb 4 oz (94.9 kg)   BMI 30.90 kg/m  , BMI Body mass index is 30.9 kg/m. GEN: Well nourished, well developed, in no acute distress , obese HEENT: normal  Neck: no JVD, carotid bruits, or masses Cardiac: Irregularly irregular; 1- 2+ SEM RSB no rubs, or gallops,1+ pitting edema on the left above the sock line, trace pitting edema on the right  Respiratory:  clear to auscultation bilaterally, normal work of breathing GI: soft, nontender, nondistended, + BS MS: no deformity or atrophy  Skin: warm and dry, no rash Neuro:  Strength and sensation are intact Psych: euthymic mood, full affect    Recent Labs: 05/08/2016: ALT 24 05/11/2016: B Natriuretic Peptide 459.0 05/19/2016: BUN 33; Creatinine, Ser 1.64; Hemoglobin 11.8; Magnesium 1.5; Platelets 132; Potassium 3.8; Sodium 144    Lipid Panel Lab Results  Component Value Date   CHOL 119 02/28/2014   HDL 44 02/28/2014   LDLCALC 55 02/28/2014   TRIG 99 02/28/2014      Wt Readings from Last 3 Encounters:  02/06/17 209 lb 4 oz (94.9 kg)  08/12/16 207 lb 8 oz (94.1 kg)  06/03/16 203 lb (92.1 kg)       ASSESSMENT AND PLAN:   Chronic atrial fibrillation ([name]) - Plan: EKG 12-Lead on eliquis, tolerating medication well on diltiazem 120 mg twice a day with Coreg 25 twice daily with good rate control   Essential hypertension Long discussion concerning his medications He will take clonidine at 8 PM not 5 PM as blood pressures are elevated in the morning If blood pressure runs low in the morning we could hold the isosorbide which was added most recently  Typically has labile hypertension no new medications added today  Acute on chronic diastolic CHF (congestive heart failure) ([place]) - Plan: EKG 12-Lead Lasix daily, stable renal function creatinine  1.49  Mixed hyperlipidemia - Plan: EKG 12-Lead Cholesterol is at goal on the current lipid regimen. No changes to the medications were made. Stable  Type 2 diabetes mellitus with complication, without long-term current use of insulin ([name]) - Plan: EKG 12-Lead Hemoglobin A1c previously 8, recommended low, carbohydrate diet  Regular exercise program  Acute on chronic renal failure  baseline 1.5 Follow-up with nephrology  Dr. Holley Raring    Total encounter time more than 25 minutes  Greater than 50% was spent in counseling and coordination of care with the patient   Disposition:   F/U  6 months   Orders Placed This Encounter  Procedures  . EKG 12-Lead     Signed, Esmond Plants, M.D., Ph.D. 02/06/2017  [place], [place]

## 2017-02-06 ENCOUNTER — Encounter: Payer: Self-pay | Admitting: Cardiovascular Disease

## 2017-02-06 ENCOUNTER — Ambulatory Visit (INDEPENDENT_AMBULATORY_CARE_PROVIDER_SITE_OTHER): Payer: Medicare Other | Admitting: Cardiovascular Disease

## 2017-02-06 VITALS — BP 110/60 | HR 62 | Ht 69.0 in | Wt 209.2 lb

## 2017-02-06 DIAGNOSIS — I5033 Acute on chronic diastolic (congestive) heart failure: Secondary | ICD-10-CM | POA: Diagnosis not present

## 2017-02-06 DIAGNOSIS — E118 Type 2 diabetes mellitus with unspecified complications: Secondary | ICD-10-CM | POA: Diagnosis not present

## 2017-02-06 DIAGNOSIS — I10 Essential (primary) hypertension: Secondary | ICD-10-CM

## 2017-02-06 DIAGNOSIS — R0602 Shortness of breath: Secondary | ICD-10-CM

## 2017-02-06 DIAGNOSIS — I482 Chronic atrial fibrillation, unspecified: Secondary | ICD-10-CM

## 2017-02-06 DIAGNOSIS — I2510 Atherosclerotic heart disease of native coronary artery without angina pectoris: Secondary | ICD-10-CM | POA: Diagnosis not present

## 2017-02-06 DIAGNOSIS — R600 Localized edema: Secondary | ICD-10-CM

## 2017-02-06 NOTE — Patient Instructions (Addendum)
Medication Instructions:   Please move the clonidine to before bed Please move the diltiazem from before bed to dinnertime (change all the dinnertime to 8 pm)  If blood pressure runs low in the AM with dizziness, hold the imdur/isosorbide  Labwork:  No new labs needed  Testing/Procedures:  No further testing at this time   Follow-Up: It was a pleasure seeing you in the office today. Please call us if you have new issues that need to be addressed before your next appt.  [phone]  Your physician wants you to follow-up in: 6 months.  You will receive a reminder letter in the mail two months in advance. If you don't receive a letter, please call our office to schedule the follow-up appointment.  If you need a refill on your cardiac medications before your next appointment, please call your pharmacy.

## 2017-02-09 DIAGNOSIS — D0461 Carcinoma in situ of skin of right upper limb, including shoulder: Secondary | ICD-10-CM | POA: Diagnosis not present

## 2017-02-09 DIAGNOSIS — C44619 Basal cell carcinoma of skin of left upper limb, including shoulder: Secondary | ICD-10-CM | POA: Diagnosis not present

## 2017-02-09 DIAGNOSIS — D0462 Carcinoma in situ of skin of left upper limb, including shoulder: Secondary | ICD-10-CM | POA: Diagnosis not present

## 2017-02-13 DIAGNOSIS — M9903 Segmental and somatic dysfunction of lumbar region: Secondary | ICD-10-CM | POA: Diagnosis not present

## 2017-02-13 DIAGNOSIS — M545 Low back pain: Secondary | ICD-10-CM | POA: Diagnosis not present

## 2017-02-13 DIAGNOSIS — M9904 Segmental and somatic dysfunction of sacral region: Secondary | ICD-10-CM | POA: Diagnosis not present

## 2017-02-13 DIAGNOSIS — M9902 Segmental and somatic dysfunction of thoracic region: Secondary | ICD-10-CM | POA: Diagnosis not present

## 2017-03-03 DIAGNOSIS — M9903 Segmental and somatic dysfunction of lumbar region: Secondary | ICD-10-CM | POA: Diagnosis not present

## 2017-03-03 DIAGNOSIS — M545 Low back pain: Secondary | ICD-10-CM | POA: Diagnosis not present

## 2017-03-03 DIAGNOSIS — M9904 Segmental and somatic dysfunction of sacral region: Secondary | ICD-10-CM | POA: Diagnosis not present

## 2017-03-03 DIAGNOSIS — M9902 Segmental and somatic dysfunction of thoracic region: Secondary | ICD-10-CM | POA: Diagnosis not present

## 2017-03-17 DIAGNOSIS — M545 Low back pain: Secondary | ICD-10-CM | POA: Diagnosis not present

## 2017-03-17 DIAGNOSIS — M9902 Segmental and somatic dysfunction of thoracic region: Secondary | ICD-10-CM | POA: Diagnosis not present

## 2017-03-17 DIAGNOSIS — M9903 Segmental and somatic dysfunction of lumbar region: Secondary | ICD-10-CM | POA: Diagnosis not present

## 2017-03-17 DIAGNOSIS — M9904 Segmental and somatic dysfunction of sacral region: Secondary | ICD-10-CM | POA: Diagnosis not present

## 2017-03-19 DIAGNOSIS — E7800 Pure hypercholesterolemia, unspecified: Secondary | ICD-10-CM | POA: Diagnosis not present

## 2017-03-19 DIAGNOSIS — F325 Major depressive disorder, single episode, in full remission: Secondary | ICD-10-CM | POA: Diagnosis not present

## 2017-03-19 DIAGNOSIS — I480 Paroxysmal atrial fibrillation: Secondary | ICD-10-CM | POA: Diagnosis not present

## 2017-03-19 DIAGNOSIS — I10 Essential (primary) hypertension: Secondary | ICD-10-CM | POA: Diagnosis not present

## 2017-03-19 DIAGNOSIS — E876 Hypokalemia: Secondary | ICD-10-CM | POA: Diagnosis not present

## 2017-03-19 DIAGNOSIS — E119 Type 2 diabetes mellitus without complications: Secondary | ICD-10-CM | POA: Diagnosis not present

## 2017-03-26 DIAGNOSIS — N179 Acute kidney failure, unspecified: Secondary | ICD-10-CM | POA: Diagnosis not present

## 2017-03-31 DIAGNOSIS — M545 Low back pain: Secondary | ICD-10-CM | POA: Diagnosis not present

## 2017-03-31 DIAGNOSIS — M9903 Segmental and somatic dysfunction of lumbar region: Secondary | ICD-10-CM | POA: Diagnosis not present

## 2017-03-31 DIAGNOSIS — M9902 Segmental and somatic dysfunction of thoracic region: Secondary | ICD-10-CM | POA: Diagnosis not present

## 2017-03-31 DIAGNOSIS — M9904 Segmental and somatic dysfunction of sacral region: Secondary | ICD-10-CM | POA: Diagnosis not present

## 2017-04-23 DIAGNOSIS — M9904 Segmental and somatic dysfunction of sacral region: Secondary | ICD-10-CM | POA: Diagnosis not present

## 2017-04-23 DIAGNOSIS — M545 Low back pain: Secondary | ICD-10-CM | POA: Diagnosis not present

## 2017-04-23 DIAGNOSIS — M9902 Segmental and somatic dysfunction of thoracic region: Secondary | ICD-10-CM | POA: Diagnosis not present

## 2017-04-23 DIAGNOSIS — M9903 Segmental and somatic dysfunction of lumbar region: Secondary | ICD-10-CM | POA: Diagnosis not present

## 2017-05-15 DIAGNOSIS — N2581 Secondary hyperparathyroidism of renal origin: Secondary | ICD-10-CM | POA: Diagnosis not present

## 2017-05-15 DIAGNOSIS — D631 Anemia in chronic kidney disease: Secondary | ICD-10-CM | POA: Diagnosis not present

## 2017-05-15 DIAGNOSIS — N183 Chronic kidney disease, stage 3 (moderate): Secondary | ICD-10-CM | POA: Diagnosis not present

## 2017-05-15 DIAGNOSIS — I10 Essential (primary) hypertension: Secondary | ICD-10-CM | POA: Diagnosis not present

## 2017-05-22 DIAGNOSIS — M9903 Segmental and somatic dysfunction of lumbar region: Secondary | ICD-10-CM | POA: Diagnosis not present

## 2017-05-22 DIAGNOSIS — M545 Low back pain: Secondary | ICD-10-CM | POA: Diagnosis not present

## 2017-05-22 DIAGNOSIS — N179 Acute kidney failure, unspecified: Secondary | ICD-10-CM | POA: Diagnosis not present

## 2017-05-22 DIAGNOSIS — M9904 Segmental and somatic dysfunction of sacral region: Secondary | ICD-10-CM | POA: Diagnosis not present

## 2017-05-22 DIAGNOSIS — M9902 Segmental and somatic dysfunction of thoracic region: Secondary | ICD-10-CM | POA: Diagnosis not present

## 2017-06-04 DIAGNOSIS — L570 Actinic keratosis: Secondary | ICD-10-CM | POA: Diagnosis not present

## 2017-06-04 DIAGNOSIS — C4441 Basal cell carcinoma of skin of scalp and neck: Secondary | ICD-10-CM | POA: Diagnosis not present

## 2017-06-04 DIAGNOSIS — C44619 Basal cell carcinoma of skin of left upper limb, including shoulder: Secondary | ICD-10-CM | POA: Diagnosis not present

## 2017-06-04 DIAGNOSIS — C44712 Basal cell carcinoma of skin of right lower limb, including hip: Secondary | ICD-10-CM | POA: Diagnosis not present

## 2017-06-04 DIAGNOSIS — Z85820 Personal history of malignant melanoma of skin: Secondary | ICD-10-CM | POA: Diagnosis not present

## 2017-06-04 DIAGNOSIS — D485 Neoplasm of uncertain behavior of skin: Secondary | ICD-10-CM | POA: Diagnosis not present

## 2017-06-04 DIAGNOSIS — Z08 Encounter for follow-up examination after completed treatment for malignant neoplasm: Secondary | ICD-10-CM | POA: Diagnosis not present

## 2017-06-04 DIAGNOSIS — D0461 Carcinoma in situ of skin of right upper limb, including shoulder: Secondary | ICD-10-CM | POA: Diagnosis not present

## 2017-06-04 DIAGNOSIS — Z85828 Personal history of other malignant neoplasm of skin: Secondary | ICD-10-CM | POA: Diagnosis not present

## 2017-06-04 DIAGNOSIS — X32XXXA Exposure to sunlight, initial encounter: Secondary | ICD-10-CM | POA: Diagnosis not present

## 2017-06-17 DIAGNOSIS — E7800 Pure hypercholesterolemia, unspecified: Secondary | ICD-10-CM | POA: Diagnosis not present

## 2017-06-17 DIAGNOSIS — I10 Essential (primary) hypertension: Secondary | ICD-10-CM | POA: Diagnosis not present

## 2017-06-17 DIAGNOSIS — E876 Hypokalemia: Secondary | ICD-10-CM | POA: Diagnosis not present

## 2017-06-17 DIAGNOSIS — E119 Type 2 diabetes mellitus without complications: Secondary | ICD-10-CM | POA: Diagnosis not present

## 2017-06-25 ENCOUNTER — Telehealth: Payer: Self-pay | Admitting: Cardiovascular Disease

## 2017-06-25 DIAGNOSIS — G4733 Obstructive sleep apnea (adult) (pediatric): Secondary | ICD-10-CM | POA: Diagnosis not present

## 2017-06-25 NOTE — Telephone Encounter (Signed)
°*  STAT* If patient is at the pharmacy, call can be transferred to refill team.   1. Which medications need to be refilled? (please list name of each medication and dose if known)  ELIQUIS 5 MG - 1 tablet 2 times daily  2. Which pharmacy/location (including street and city if local pharmacy) is medication to be sent to? [facility] in [place]   3. Do they need a 30 day or 90 day supply? 90 day   States they use to receive through [facility] but this year they have not qualified  Please call Butch Penny with any questions

## 2017-06-25 NOTE — Telephone Encounter (Signed)
Please review for refill, Thanks !  

## 2017-06-29 MED ORDER — APIXABAN 5 MG PO TABS: 5.0000 mg | ORAL_TABLET | Freq: Two times a day (BID) | ORAL | 3 refills | Status: DC

## 2017-06-29 NOTE — Telephone Encounter (Signed)
Eliquis 5 mg BID sent to [facility] in [place].

## 2017-06-30 NOTE — Telephone Encounter (Signed)
Patient daughter says the pharmacy did not receive this refill.  Please resend.

## 2017-07-06 IMAGING — CT CT CHEST W/O CM
2 of 3 series · 15 of 36 positions shown, 18 images · non-contrast
Comparison: CT 05/08/2016

CLINICAL DATA: 76-year-old male with a history of pneumonia

EXAM:
CT CHEST WITHOUT CONTRAST
TECHNIQUE: Multidetector CT imaging of the chest was performed following the
standard protocol without IV contrast.

[Series 2: thorax · axial · 0.74mm/px · z∈[-508,-266]mm · 12 of 143 slices shown, 15 images]
[im 11/143  mediastinal]
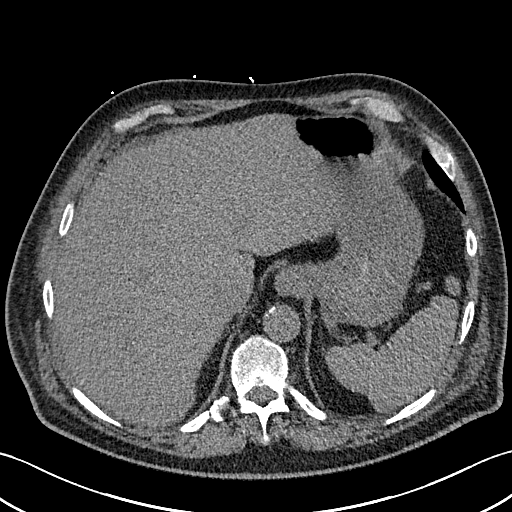
[im 11/143  lung]
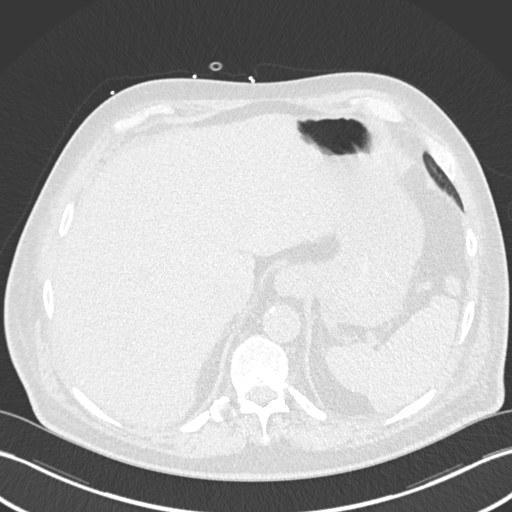
[im 22/143  lung]
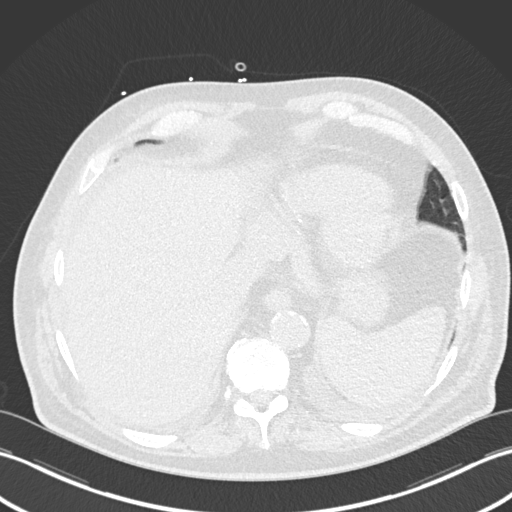
[im 32/143  lung]
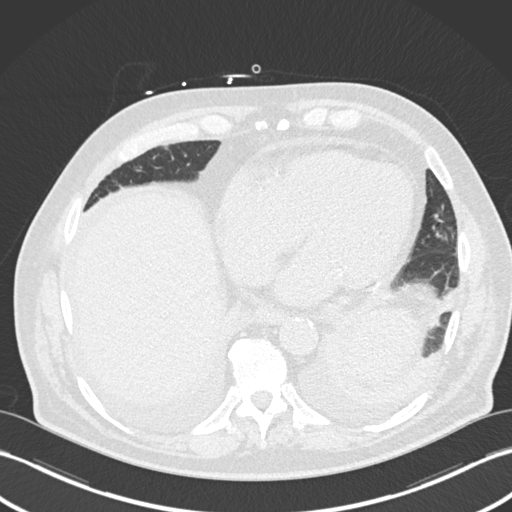
[im 43/143  lung]
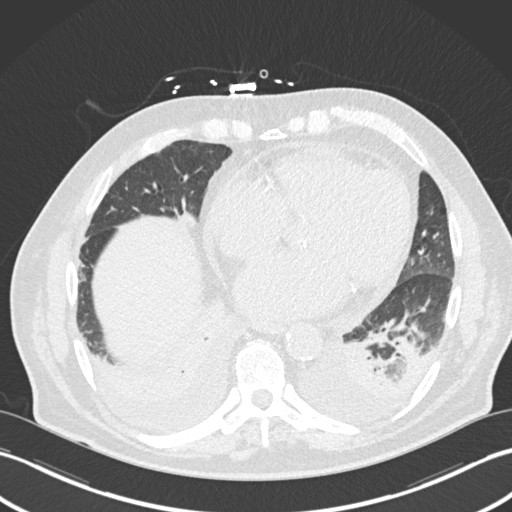
[im 53/143  mediastinal]
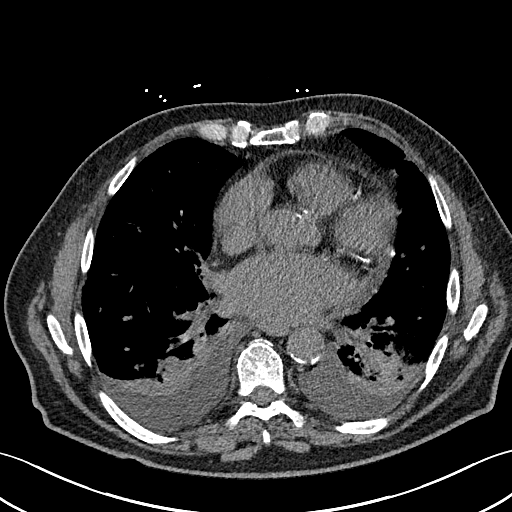
[im 53/143  lung]
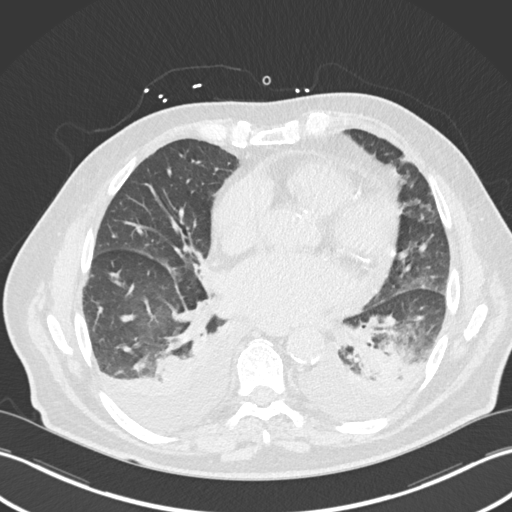
[im 64/143  lung]
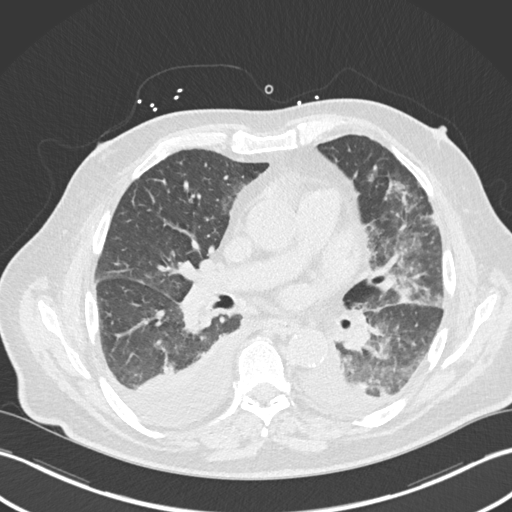
[im 79/143  lung]
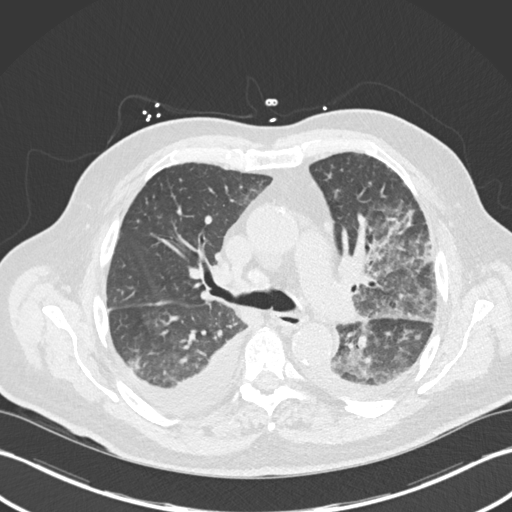
[im 90/143  lung]
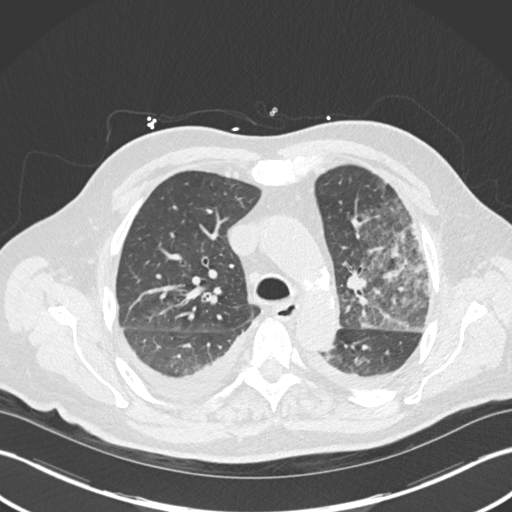
[im 100/143  mediastinal]
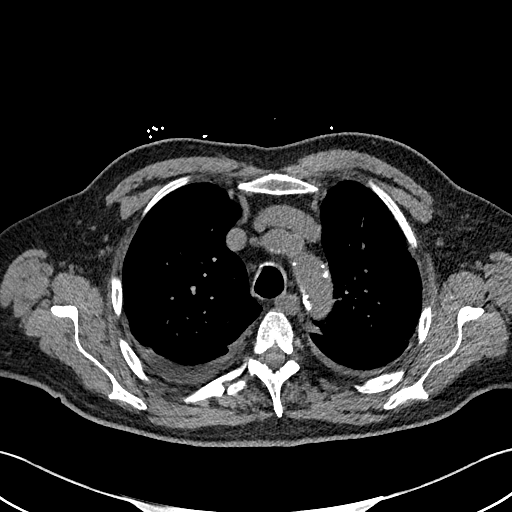
[im 100/143  lung]
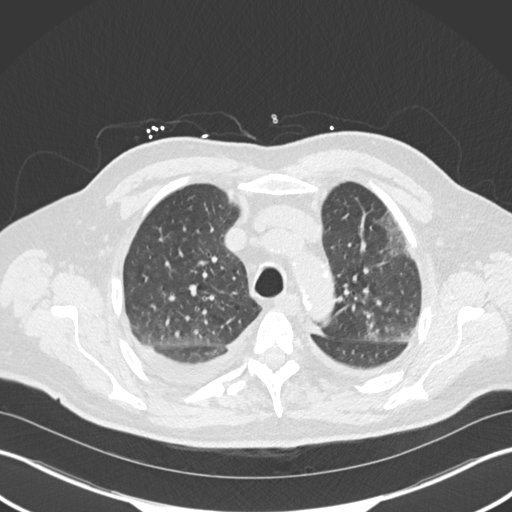
[im 111/143  lung]
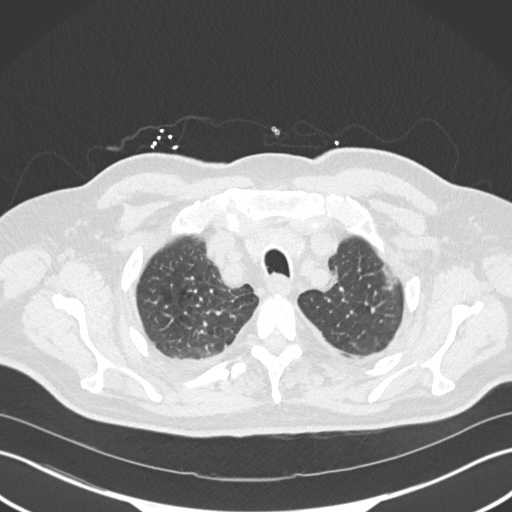
[im 121/143  lung]
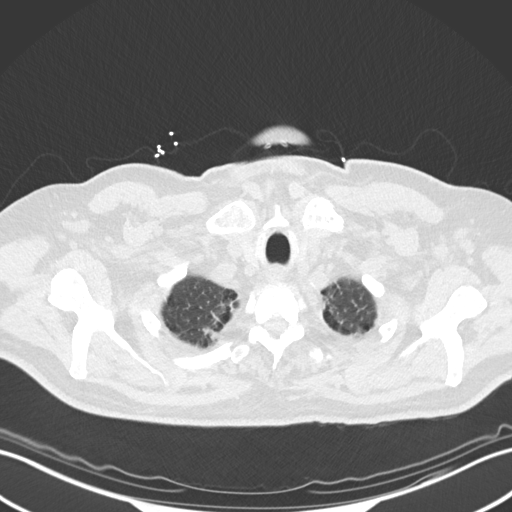
[im 132/143  lung]
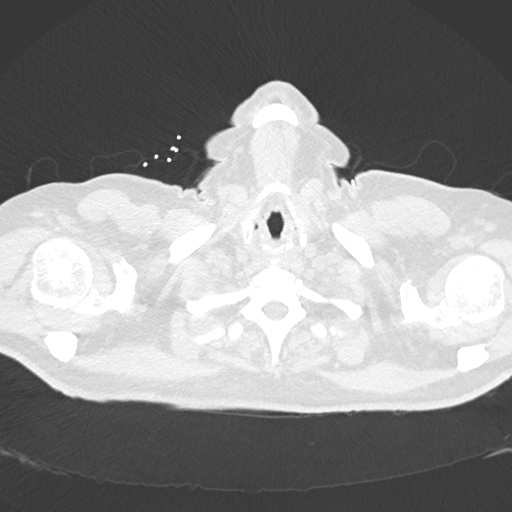

[Series 5: coronal · coronal · 0.58mm/px · 3 of 142 slices shown]
[im 29/142  lung]
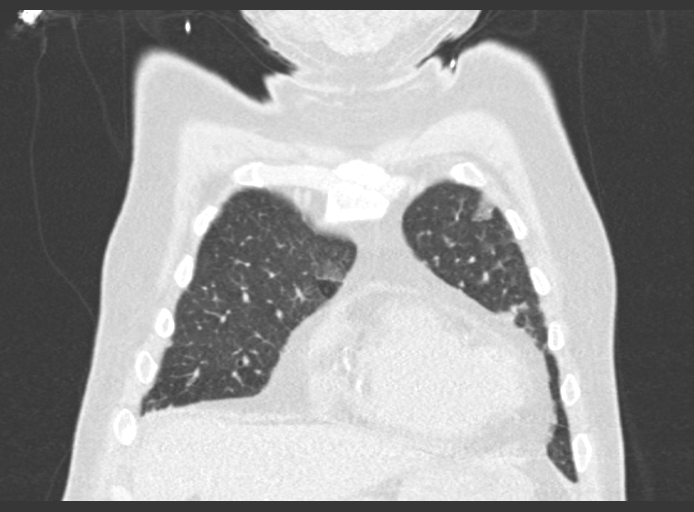
[im 57/142  lung]
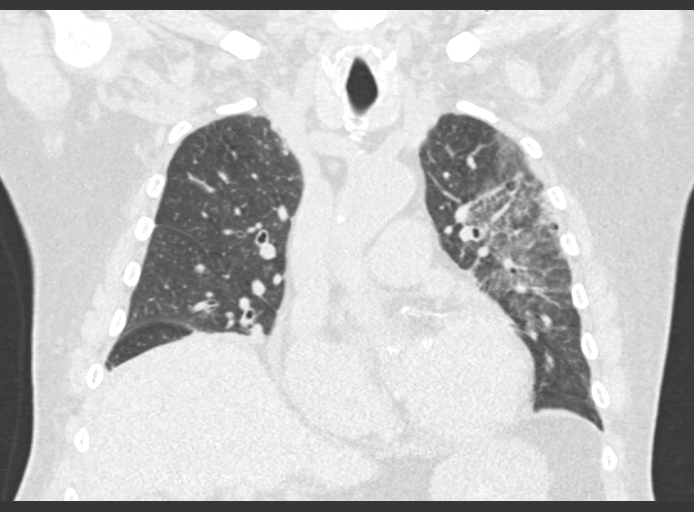
[im 85/142  lung]
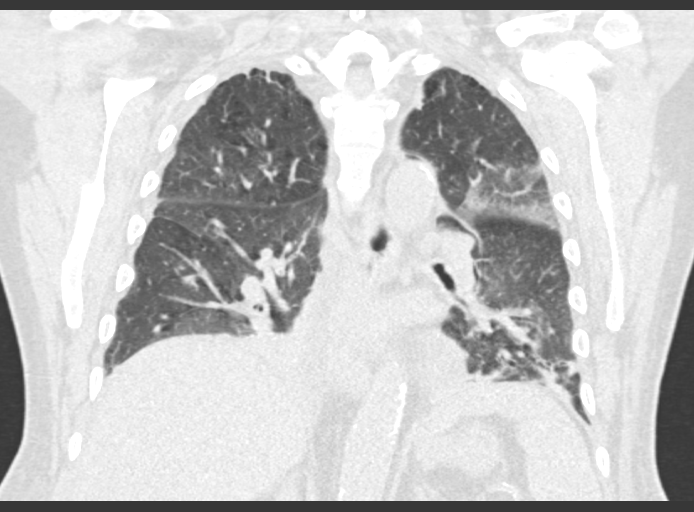

[15 of 36 positions shown; findings below may reference images not displayed]

FINDINGS: Cardiovascular: Since the prior CT there has been development of
trace pericardial fluid/ thickening. Unchanged heart size.

Calcifications of the aortic valve.  Aortic atherosclerosis.

Calcifications of left main, left anterior descending, circumflex,
right coronary artery.

Mediastinum/Nodes: Similar distribution and number of mediastinal
lymph nodes, though since the prior there has been decreasing size
throughout.

Lungs/Pleura: Compare to prior there is improved confluent airspace
opacity of the left upper lobe and left lower lobe, however,
persisting mixed ground-glass and nodular opacity in similar
distribution.

New small left and right pleural effusions.

Bronchial wall thickening.

No interlobular septal thickening.

Upper Abdomen: No acute abnormality.

Musculoskeletal: No displaced fracture. Degenerative changes of the
spine.
IMPRESSION: Since the prior CT 05/08/2016, there has been partial clearing of
left upper lobe and left lower lobe airspace disease, compatible
with improving multifocal pneumonia.

Interval development of small bilateral pleural effusions.

Interval development of trace pericardial fluid/thickening.

Bilateral bronchial wall thickening, likely inflammatory.

Re- demonstration of left main and 3 vessel coronary artery disease,
aortic valve calcifications, and aortic atherosclerosis.

## 2017-07-25 ENCOUNTER — Other Ambulatory Visit: Payer: Self-pay | Admitting: Cardiovascular Disease

## 2017-07-27 ENCOUNTER — Other Ambulatory Visit: Payer: Self-pay

## 2017-07-27 DIAGNOSIS — Z7901 Long term (current) use of anticoagulants: Secondary | ICD-10-CM

## 2017-07-27 NOTE — Telephone Encounter (Signed)
Refill Request.  

## 2017-07-29 ENCOUNTER — Encounter: Payer: Self-pay | Admitting: Cardiovascular Disease

## 2017-07-31 DIAGNOSIS — C4442 Squamous cell carcinoma of skin of scalp and neck: Secondary | ICD-10-CM | POA: Diagnosis not present

## 2017-08-03 NOTE — Telephone Encounter (Signed)
Patient daughter Butch Penny calling to check on status of message Please call to advise

## 2017-08-06 DIAGNOSIS — H04223 Epiphora due to insufficient drainage, bilateral lacrimal glands: Secondary | ICD-10-CM | POA: Diagnosis not present

## 2017-08-10 DIAGNOSIS — D0461 Carcinoma in situ of skin of right upper limb, including shoulder: Secondary | ICD-10-CM | POA: Diagnosis not present

## 2017-08-10 DIAGNOSIS — C44712 Basal cell carcinoma of skin of right lower limb, including hip: Secondary | ICD-10-CM | POA: Diagnosis not present

## 2017-08-22 NOTE — Progress Notes (Signed)
Cardiology Office Note  Date:  08/25/2017   ID:  Todd Valentine, DOB 12/24/1940, MRN [id number]  PCP:  [name], MD   Chief Complaint  Patient presents with  . OTHER    6 month f/u discuss medications and feeling like he is in a daze. Meds reviewed verbally with pt.    HPI:  Todd Valentine is a very pleasant 77 year old gentleman with  long history of smoking for 30-40 years,  alcohol use,  obesity,  chronic diastolic CHF,Echo March 5427 with ejection fraction 50-55% chronic atrial fibrillation,  hypertension,  type 2 diabetes,  Significant coronary disease by catheterization August 2015 three-vessel  sleep apnea, uses his CPAP periodically presenting for follow-up of his atrial fibrillation Lives alone with supportive family after wife unexpectedly passed away  Labs: HBA1C 7.3 no recent lipid panel available LFTs  Isosorbide running low,we received an email Stopped isosorbide Started on losartan by nephrology  Feels foggy at times, "bad feeling",  Could be morning or afternoon Typically happens when he is standing up not sitting or lying down Can sometimes recover after several minutes other times takes longer time  Recent dermatologic procedures wedge resection right lower extremity, other places Mild erythema around the site  Lab work reviewed showing creatinine 1.7 Followed by nephrology No regular exercise Still working  EKG personally reviewed by myself on todays visit Shows atrial fibrillation with rate 67 bpm right bundle branch block  no change from previous EKGs   Other past medical history reviewed Hospital admission 05/08/16 for PNA,sepsis, atrial fib with RVR On bipap For many days, ABX. Declined ventilator if needed acute kidney injury/ATN,  requiring CRRT Diltiazem dose increased at d/c up to 240 daily D/c on pradaxa (was going to change to eliquis) Off lisinopril/HCTZ, metformin  Labs at d/c  CR 1.64, BUN 33  total cholesterol  well controlled, 130 or less  hospital admission 10/02/2013 with discharge August 12. He presented with shortness of breath, Atrial fibrillation with RVR. Echocardiogram showed normal ejection fraction. Symptoms improved with diuresis.  Echocardiogram 10/02/2013 showing ejection fraction 55-60%, mildly dilated left atrium, mild to moderate MR and TR  He had cardiac catheterization 10/06/2013 showing occluded diagonal vessel, 40% proximal LAD disease, bifurcating marginal branch/ramus. Ramus had 60% followed by 80% disease, OM with 50% mid disease at least. RCA with 40% mid disease. Medical management recommended  hemoglobin A1c 7.1 up from 6.9 Previously was on simvastatin. Currently not on simvastatin for uncertain reasons. On the simvastatin, total cholesterol was 139, LDL 50  Prior weight at the time of hospital discharge was 218 pounds, now he reports 228 pounds at home, 230 pounds in our clinic  PMH:   has a past medical history of Acute renal failure ([place]), BPH (benign prostatic hyperplasia), Chronic atrial fibrillation ([name]), Chronic diastolic CHF (congestive heart failure) ([place]), Chronic insomnia, Coronary artery disease, non-occlusive (10/06/2013), Depression, Diabetes mellitus with complication ([place]), History of diverticulitis, Hyperkalemia, Hyperlipidemia, Hypertension, Malignant melanoma ([place]), Mitral regurgitation, and Sleep apnea in adult.  PSH:    Past Surgical History:  Procedure Laterality Date  . APPENDECTOMY    . CARDIAC CATHETERIZATION  10/06/2013  . COLONOSCOPY    . COLONOSCOPY WITH PROPOFOL N/A 11/23/2014   Procedure: COLONOSCOPY WITH PROPOFOL;  Surgeon: Manya Silvas, MD;  Location: [facility] ENDOSCOPY;  Service: Endoscopy;  Laterality: N/A;  . MOHS SURGERY    . removal tubular adenoma      Current Outpatient Medications on File Prior to Visit  Medication Sig  Dispense Refill  . acetaminophen (TYLENOL) 500 MG tablet Take 1,000 mg by mouth every 6 (six) hours  as needed.    [name] apixaban (ELIQUIS) 5 MG TABS tablet Take 1 tablet (5 mg total) by mouth 2 (two) times daily. 60 tablet 1  . atorvastatin (LIPITOR) 20 MG tablet Take 20 mg by mouth daily.    . carvedilol (COREG) 25 MG tablet Take 1 tablet (25 mg total) by mouth 2 (two) times daily with a meal.    . cloNIDine (CATAPRES) 0.1 MG tablet Take 2 tablets (0.2 mg total) by mouth 2 (two) times daily. 360 tablet 3  . diltiazem (CARDIZEM CD) 120 MG 24 hr capsule Take 1 capsule (120 mg total) 2 (two) times daily by mouth. 60 capsule 6  . famotidine (PEPCID) 20 MG tablet Take 1 tablet (20 mg total) by mouth at bedtime.    . furosemide (LASIX) 20 MG tablet Take 20 mg by mouth daily.    [name] glimepiride (AMARYL) 4 MG tablet Take 4 mg by mouth 2 (two) times daily.     [name] linagliptin (TRADJENTA) 5 MG TABS tablet Take 5 mg by mouth daily.    [name] losartan (COZAAR) 25 MG tablet Take 25 mg by mouth daily.    . magnesium oxide (MAG-OX) 400 MG tablet Take 400 mg by mouth daily.    . nitroGLYCERIN (NITROSTAT) 0.4 MG SL tablet Place 1 tablet (0.4 mg total) every 5 (five) minutes as needed under the tongue for chest pain. 25 tablet 3  . potassium chloride 20 MEQ TBCR Take 20 mEq by mouth daily. 90 tablet 3  . tamsulosin (FLOMAX) 0.4 MG CAPS capsule Take 0.4 mg by mouth daily.     . traZODone (DESYREL) 50 MG tablet Take 50 mg by mouth at bedtime as needed for sleep.     No current facility-administered medications on file prior to visit.     Allergies:   Ciprofloxacin and Sitagliptin   Social History:  The patient  reports that he has quit smoking. He has never used smokeless tobacco. He reports that he drinks alcohol. He reports that he does not use drugs.   Family History:   Family history is unknown by patient.    Review of Systems: Review of Systems  Constitutional: Positive for malaise/fatigue.  Respiratory: Negative.   Cardiovascular: Positive for leg swelling.  Gastrointestinal: Negative.   Musculoskeletal:  Negative.        Gait instability  Neurological: Negative.   Psychiatric/Behavioral: Negative.   All other systems reviewed and are negative.    PHYSICAL EXAM: VS:  BP 118/70 (BP Location: Left Arm, Patient Position: Sitting, Cuff Size: Normal)   Pulse 67   Ht 5\' 9"  (1.753 m)   Wt 215 lb 4 oz (97.6 kg)   BMI 31.79 kg/m  , BMI Body mass index is 31.79 kg/m. Constitutional:  oriented to person, place, and time. No distress.  HENT:  Head: Normocephalic and atraumatic.  Eyes:  no discharge. No scleral icterus.  Neck: Normal range of motion. Neck supple. No JVD present.  Cardiovascular:  Irregularly irregular; 1- 2+ SEM RSB no rubs, or gallops,1+ pitting edema bilateralabove the sock line Pulmonary/Chest: Effort normal and breath sounds normal. No stridor. No respiratory distress.  no wheezes.  no rales.  no tenderness.  Abdominal: Soft.  no distension.  no tenderness.  Musculoskeletal: Normal range of motion.  no  tenderness or deformity.  Neurological:  normal muscle tone. Coordination normal. No atrophy Skin: Skin  is warm and dry. No rash noted. not diaphoretic.  Psychiatric:  normal mood and affect. behavior is normal. Thought content normal.    Recent Labs: No results found for requested labs within last 8760 hours.    Lipid Panel Lab Results  Component Value Date   CHOL 119 02/28/2014   HDL 44 02/28/2014   LDLCALC 55 02/28/2014   TRIG 99 02/28/2014      Wt Readings from Last 3 Encounters:  08/25/17 215 lb 4 oz (97.6 kg)  02/06/17 209 lb 4 oz (94.9 kg)  08/12/16 207 lb 8 oz (94.1 kg)       ASSESSMENT AND PLAN:   Chronic atrial fibrillation ([name]) - Plan: EKG 12-Lead on eliquis, tolerating medication well Patient assistance forms filled out to send to the company Given his orthostasis symptoms we will decrease diltiazem down to once a day 120 mg extended release in the morning This may also help with his lower extremity edema  Essential hypertension Or  history of labile hypertension 626 systolic in the exam room confirmed by myself Symptoms concerning for orthostasis Will decrease diltiazem down to once a day, 120 mg extended release once a day in the morning  Acute on chronic diastolic CHF (congestive heart failure) ([place]) - Plan: EKG 12-Lead Lasix daily, stable renal function creatinine 1.7  Mixed hyperlipidemia - Plan: EKG 12-Lead Repeat lipid panel ordered today with LFTs Previously at goal  Type 2 diabetes mellitus with complication, without long-term current use of insulin ([place]) - Plan: EKG 12-Lead Hemoglobin A1c improved on new medication 7.3  Acute on chronic renal failure  baseline 1.5 Follow-up with nephrology  Dr. Holley Raring Now on losartan    Total encounter time more than 25 minutes  Greater than 50% was spent in counseling and coordination of care with the patient   Disposition:   F/U  6 months   Orders Placed This Encounter  Procedures  . EKG 12-Lead     Signed, Esmond Plants, M.D., Ph.D. 08/25/2017  [place], [place]

## 2017-08-24 DIAGNOSIS — C44619 Basal cell carcinoma of skin of left upper limb, including shoulder: Secondary | ICD-10-CM | POA: Diagnosis not present

## 2017-08-25 ENCOUNTER — Encounter: Payer: Self-pay | Admitting: Cardiovascular Disease

## 2017-08-25 ENCOUNTER — Ambulatory Visit (INDEPENDENT_AMBULATORY_CARE_PROVIDER_SITE_OTHER): Payer: Medicare Other | Admitting: Cardiovascular Disease

## 2017-08-25 VITALS — BP 128/70 | HR 67 | Ht 69.0 in | Wt 215.2 lb

## 2017-08-25 DIAGNOSIS — I482 Chronic atrial fibrillation, unspecified: Secondary | ICD-10-CM

## 2017-08-25 DIAGNOSIS — I10 Essential (primary) hypertension: Secondary | ICD-10-CM | POA: Diagnosis not present

## 2017-08-25 DIAGNOSIS — R600 Localized edema: Secondary | ICD-10-CM | POA: Diagnosis not present

## 2017-08-25 DIAGNOSIS — I5033 Acute on chronic diastolic (congestive) heart failure: Secondary | ICD-10-CM | POA: Diagnosis not present

## 2017-08-25 DIAGNOSIS — E782 Mixed hyperlipidemia: Secondary | ICD-10-CM | POA: Diagnosis not present

## 2017-08-25 DIAGNOSIS — R739 Hyperglycemia, unspecified: Secondary | ICD-10-CM | POA: Diagnosis not present

## 2017-08-25 DIAGNOSIS — E118 Type 2 diabetes mellitus with unspecified complications: Secondary | ICD-10-CM | POA: Diagnosis not present

## 2017-08-25 DIAGNOSIS — I2510 Atherosclerotic heart disease of native coronary artery without angina pectoris: Secondary | ICD-10-CM

## 2017-08-25 DIAGNOSIS — R0602 Shortness of breath: Secondary | ICD-10-CM | POA: Diagnosis not present

## 2017-08-25 MED ORDER — APIXABAN 5 MG PO TABS
5.0000 mg | ORAL_TABLET | Freq: Two times a day (BID) | ORAL | 3 refills | Status: DC
Start: 2017-08-25 — End: 2018-10-02

## 2017-08-25 NOTE — Patient Instructions (Addendum)
Medication Instructions:   Hold the evening diltiazem Take diltiazem one a day in the Am   Labwork  Lipid panel and LFTS  today  Testing/Procedures:  No further testing at this time   Follow-Up: It was a pleasure seeing you in the office today. Please call us if you have new issues that need to be addressed before your next appt.  [phone]  Your physician wants you to follow-up in: 12 months.  You will receive a reminder letter in the mail two months in advance. If you don't receive a letter, please call our office to schedule the follow-up appointment.  If you need a refill on your cardiac medications before your next appointment, please call your pharmacy.  For educational health videos Log in to : [email or web address] Or : [email or web address], password : [email or web address]

## 2017-08-26 LAB — HEPATIC FUNCTION PANEL
ALT: 17 IU/L (ref 0–44)
AST: 17 IU/L (ref 0–40)
Albumin: 4.2 g/dL (ref 3.5–4.8)
Alkaline Phosphatase: 126 IU/L — ABNORMAL HIGH (ref 39–117)
Bilirubin Total: 0.8 mg/dL (ref 0.0–1.2)
Bilirubin, Direct: 0.24 mg/dL (ref 0.00–0.40)
Total Protein: 6.8 g/dL (ref 6.0–8.5)

## 2017-08-26 LAB — LIPID PANEL
Chol/HDL Ratio: 3.2 ratio (ref 0.0–5.0)
Cholesterol, Total: 97 mg/dL — ABNORMAL LOW (ref 100–199)
HDL: 30 mg/dL — ABNORMAL LOW (ref >39)
LDL Calculated: 41 mg/dL (ref 0–99)
Triglycerides: 128 mg/dL (ref 0–149)
VLDL Cholesterol Cal: 26 mg/dL (ref 5–40)

## 2017-09-01 ENCOUNTER — Other Ambulatory Visit: Payer: Self-pay | Admitting: Cardiovascular Disease

## 2017-09-03 ENCOUNTER — Other Ambulatory Visit: Payer: Self-pay | Admitting: *Deleted

## 2017-09-03 ENCOUNTER — Encounter: Payer: Self-pay | Admitting: Cardiovascular Disease

## 2017-09-03 MED ORDER — DILTIAZEM HCL ER COATED BEADS 120 MG PO CP24: 120.0000 mg | ORAL_CAPSULE | Freq: Every day | ORAL | 3 refills | Status: DC

## 2017-09-28 ENCOUNTER — Other Ambulatory Visit: Payer: Self-pay | Admitting: Cardiovascular Disease

## 2017-09-28 NOTE — Telephone Encounter (Signed)
Refill request

## 2017-10-01 DIAGNOSIS — H35033 Hypertensive retinopathy, bilateral: Secondary | ICD-10-CM | POA: Diagnosis not present

## 2017-10-01 DIAGNOSIS — E119 Type 2 diabetes mellitus without complications: Secondary | ICD-10-CM | POA: Diagnosis not present

## 2017-10-01 DIAGNOSIS — H43812 Vitreous degeneration, left eye: Secondary | ICD-10-CM | POA: Diagnosis not present

## 2017-10-08 DIAGNOSIS — D631 Anemia in chronic kidney disease: Secondary | ICD-10-CM | POA: Diagnosis not present

## 2017-10-08 DIAGNOSIS — I10 Essential (primary) hypertension: Secondary | ICD-10-CM | POA: Diagnosis not present

## 2017-10-08 DIAGNOSIS — R809 Proteinuria, unspecified: Secondary | ICD-10-CM | POA: Diagnosis not present

## 2017-10-08 DIAGNOSIS — N2581 Secondary hyperparathyroidism of renal origin: Secondary | ICD-10-CM | POA: Diagnosis not present

## 2017-10-08 DIAGNOSIS — N183 Chronic kidney disease, stage 3 (moderate): Secondary | ICD-10-CM | POA: Diagnosis not present

## 2017-10-22 DIAGNOSIS — F5104 Psychophysiologic insomnia: Secondary | ICD-10-CM | POA: Diagnosis not present

## 2017-10-22 DIAGNOSIS — I10 Essential (primary) hypertension: Secondary | ICD-10-CM | POA: Diagnosis not present

## 2017-10-22 DIAGNOSIS — E119 Type 2 diabetes mellitus without complications: Secondary | ICD-10-CM | POA: Diagnosis not present

## 2017-10-22 DIAGNOSIS — E7800 Pure hypercholesterolemia, unspecified: Secondary | ICD-10-CM | POA: Diagnosis not present

## 2017-10-30 ENCOUNTER — Telehealth: Payer: Self-pay

## 2017-10-30 NOTE — Telephone Encounter (Signed)
Received a fax from [employer] patient assistance.  Application is under review and will respond within 2 business days with a decision.

## 2017-11-05 DIAGNOSIS — G4733 Obstructive sleep apnea (adult) (pediatric): Secondary | ICD-10-CM | POA: Diagnosis not present

## 2017-11-06 NOTE — Telephone Encounter (Signed)
Received notice from [employer] patient assistance foundation that patient is not eligible due to income level. Letter sent to patient and [place] as well.

## 2017-11-16 DIAGNOSIS — Z23 Encounter for immunization: Secondary | ICD-10-CM | POA: Diagnosis not present

## 2017-11-28 ENCOUNTER — Other Ambulatory Visit: Payer: Self-pay | Admitting: Cardiovascular Disease

## 2017-11-30 NOTE — Telephone Encounter (Signed)
Refill Request.  

## 2017-12-03 ENCOUNTER — Other Ambulatory Visit: Payer: Self-pay | Admitting: Cardiovascular Disease

## 2017-12-03 NOTE — Telephone Encounter (Signed)
Lmovm to clarify clonidine dose and how pt is taking medication.

## 2017-12-11 ENCOUNTER — Other Ambulatory Visit: Payer: Self-pay

## 2017-12-11 MED ORDER — CLONIDINE HCL 0.1 MG PO TABS: 0.2000 mg | ORAL_TABLET | Freq: Two times a day (BID) | ORAL | 3 refills | Status: DC

## 2017-12-11 NOTE — Telephone Encounter (Signed)
*  STAT* If patient is at the pharmacy, call can be transferred to refill team.   1. Which medications need to be refilled? (please list name of each medication and dose if known) Clonidine  2. Which pharmacy/location (including street and city if local pharmacy) is medication to be sent to? [facility] [place]  3. Do they need a 30 day or 90 day supply? [place]

## 2018-01-25 ENCOUNTER — Other Ambulatory Visit: Payer: Self-pay | Admitting: Cardiovascular Disease

## 2018-01-25 NOTE — Telephone Encounter (Signed)
Please review for refill. Thanks!  

## 2018-01-28 DIAGNOSIS — E119 Type 2 diabetes mellitus without complications: Secondary | ICD-10-CM | POA: Diagnosis not present

## 2018-02-11 DIAGNOSIS — N183 Chronic kidney disease, stage 3 (moderate): Secondary | ICD-10-CM | POA: Diagnosis not present

## 2018-02-11 DIAGNOSIS — R809 Proteinuria, unspecified: Secondary | ICD-10-CM | POA: Diagnosis not present

## 2018-02-11 DIAGNOSIS — I10 Essential (primary) hypertension: Secondary | ICD-10-CM | POA: Diagnosis not present

## 2018-02-11 DIAGNOSIS — D631 Anemia in chronic kidney disease: Secondary | ICD-10-CM | POA: Diagnosis not present

## 2018-02-11 DIAGNOSIS — N2581 Secondary hyperparathyroidism of renal origin: Secondary | ICD-10-CM | POA: Diagnosis not present

## 2018-03-03 DIAGNOSIS — R69 Illness, unspecified: Secondary | ICD-10-CM | POA: Diagnosis not present

## 2018-03-16 ENCOUNTER — Other Ambulatory Visit: Payer: Self-pay | Admitting: Cardiovascular Disease

## 2018-03-16 NOTE — Telephone Encounter (Signed)
Pt is taking Isosorbide 30 mg tablet qd. Pt mentioned that Dr. Rockey Situ did tell him to hold it at some time but when pt's BP started to elevated he mentioned to try taking the Isosorbide again.   Please advise if ok to refill.

## 2018-03-16 NOTE — Telephone Encounter (Signed)
Per last documentation from Dr. Rockey Situ:  October 18, 2017  Minna Merritts, MD  to Karna Dupes     5:53 PM  Perhaps retry the isosorbide at dinner time  Isosorbide should not drop the heart rate and should not cause any leg swelling  thx  TG   OK to refill imdur 30 mg once daily.   Thanks!

## 2018-03-16 NOTE — Telephone Encounter (Signed)
Spoke with pt and he will contact office to verify with his daughter if he is still taking Isosorbide.  Pt told to hold back @ 01/2017 OV.

## 2018-03-16 NOTE — Telephone Encounter (Signed)
Pt daughter is returning the call.

## 2018-03-23 ENCOUNTER — Other Ambulatory Visit: Payer: Self-pay | Admitting: Cardiovascular Disease

## 2018-03-23 DIAGNOSIS — R69 Illness, unspecified: Secondary | ICD-10-CM | POA: Diagnosis not present

## 2018-03-23 NOTE — Telephone Encounter (Signed)
Please review for refill, Thanks !  

## 2018-03-24 DIAGNOSIS — C44619 Basal cell carcinoma of skin of left upper limb, including shoulder: Secondary | ICD-10-CM | POA: Diagnosis not present

## 2018-03-24 DIAGNOSIS — Z85828 Personal history of other malignant neoplasm of skin: Secondary | ICD-10-CM | POA: Diagnosis not present

## 2018-03-24 DIAGNOSIS — D0461 Carcinoma in situ of skin of right upper limb, including shoulder: Secondary | ICD-10-CM | POA: Diagnosis not present

## 2018-03-24 DIAGNOSIS — X32XXXA Exposure to sunlight, initial encounter: Secondary | ICD-10-CM | POA: Diagnosis not present

## 2018-03-24 DIAGNOSIS — C44612 Basal cell carcinoma of skin of right upper limb, including shoulder: Secondary | ICD-10-CM | POA: Diagnosis not present

## 2018-03-24 DIAGNOSIS — Z08 Encounter for follow-up examination after completed treatment for malignant neoplasm: Secondary | ICD-10-CM | POA: Diagnosis not present

## 2018-03-24 DIAGNOSIS — Z85820 Personal history of malignant melanoma of skin: Secondary | ICD-10-CM | POA: Diagnosis not present

## 2018-03-24 DIAGNOSIS — C44311 Basal cell carcinoma of skin of nose: Secondary | ICD-10-CM | POA: Diagnosis not present

## 2018-03-24 DIAGNOSIS — L578 Other skin changes due to chronic exposure to nonionizing radiation: Secondary | ICD-10-CM | POA: Diagnosis not present

## 2018-03-24 DIAGNOSIS — C44519 Basal cell carcinoma of skin of other part of trunk: Secondary | ICD-10-CM | POA: Diagnosis not present

## 2018-03-24 DIAGNOSIS — L570 Actinic keratosis: Secondary | ICD-10-CM | POA: Diagnosis not present

## 2018-03-24 DIAGNOSIS — D485 Neoplasm of uncertain behavior of skin: Secondary | ICD-10-CM | POA: Diagnosis not present

## 2018-04-26 ENCOUNTER — Other Ambulatory Visit: Payer: Self-pay | Admitting: Cardiovascular Disease

## 2018-04-26 NOTE — Telephone Encounter (Signed)
Please review for refill. Thanks!  

## 2018-05-14 DIAGNOSIS — C44519 Basal cell carcinoma of skin of other part of trunk: Secondary | ICD-10-CM | POA: Diagnosis not present

## 2018-05-14 DIAGNOSIS — L570 Actinic keratosis: Secondary | ICD-10-CM | POA: Diagnosis not present

## 2018-05-14 DIAGNOSIS — C44612 Basal cell carcinoma of skin of right upper limb, including shoulder: Secondary | ICD-10-CM | POA: Diagnosis not present

## 2018-05-14 DIAGNOSIS — C44619 Basal cell carcinoma of skin of left upper limb, including shoulder: Secondary | ICD-10-CM | POA: Diagnosis not present

## 2018-05-27 ENCOUNTER — Other Ambulatory Visit: Payer: Self-pay | Admitting: Cardiovascular Disease

## 2018-06-10 DIAGNOSIS — G4733 Obstructive sleep apnea (adult) (pediatric): Secondary | ICD-10-CM | POA: Diagnosis not present

## 2018-06-14 ENCOUNTER — Encounter: Payer: Self-pay | Admitting: Cardiovascular Disease

## 2018-06-14 DIAGNOSIS — D631 Anemia in chronic kidney disease: Secondary | ICD-10-CM | POA: Diagnosis not present

## 2018-06-14 DIAGNOSIS — N183 Chronic kidney disease, stage 3 (moderate): Secondary | ICD-10-CM | POA: Diagnosis not present

## 2018-06-14 DIAGNOSIS — I10 Essential (primary) hypertension: Secondary | ICD-10-CM | POA: Diagnosis not present

## 2018-06-14 DIAGNOSIS — R809 Proteinuria, unspecified: Secondary | ICD-10-CM | POA: Diagnosis not present

## 2018-06-14 DIAGNOSIS — N2581 Secondary hyperparathyroidism of renal origin: Secondary | ICD-10-CM | POA: Diagnosis not present

## 2018-06-15 DIAGNOSIS — G4733 Obstructive sleep apnea (adult) (pediatric): Secondary | ICD-10-CM | POA: Diagnosis not present

## 2018-07-28 ENCOUNTER — Other Ambulatory Visit: Payer: Self-pay | Admitting: Cardiovascular Disease

## 2018-07-28 NOTE — Telephone Encounter (Signed)
Pt has appt w/ Dr. Rockey Situ on 09/01/18 - needs BMET at that appt to assess kidney function.

## 2018-07-28 NOTE — Telephone Encounter (Signed)
Please review for a refill. Thanks! 

## 2018-07-29 DIAGNOSIS — C44519 Basal cell carcinoma of skin of other part of trunk: Secondary | ICD-10-CM | POA: Diagnosis not present

## 2018-07-29 DIAGNOSIS — C4441 Basal cell carcinoma of skin of scalp and neck: Secondary | ICD-10-CM | POA: Diagnosis not present

## 2018-07-29 DIAGNOSIS — L570 Actinic keratosis: Secondary | ICD-10-CM | POA: Diagnosis not present

## 2018-07-29 DIAGNOSIS — Z85820 Personal history of malignant melanoma of skin: Secondary | ICD-10-CM | POA: Diagnosis not present

## 2018-07-29 DIAGNOSIS — Z85828 Personal history of other malignant neoplasm of skin: Secondary | ICD-10-CM | POA: Diagnosis not present

## 2018-07-29 DIAGNOSIS — D0471 Carcinoma in situ of skin of right lower limb, including hip: Secondary | ICD-10-CM | POA: Diagnosis not present

## 2018-07-29 DIAGNOSIS — Z08 Encounter for follow-up examination after completed treatment for malignant neoplasm: Secondary | ICD-10-CM | POA: Diagnosis not present

## 2018-07-29 DIAGNOSIS — B078 Other viral warts: Secondary | ICD-10-CM | POA: Diagnosis not present

## 2018-07-29 DIAGNOSIS — Z872 Personal history of diseases of the skin and subcutaneous tissue: Secondary | ICD-10-CM | POA: Diagnosis not present

## 2018-07-29 DIAGNOSIS — D485 Neoplasm of uncertain behavior of skin: Secondary | ICD-10-CM | POA: Diagnosis not present

## 2018-07-29 DIAGNOSIS — D0461 Carcinoma in situ of skin of right upper limb, including shoulder: Secondary | ICD-10-CM | POA: Diagnosis not present

## 2018-07-29 DIAGNOSIS — C44719 Basal cell carcinoma of skin of left lower limb, including hip: Secondary | ICD-10-CM | POA: Diagnosis not present

## 2018-07-29 DIAGNOSIS — C44712 Basal cell carcinoma of skin of right lower limb, including hip: Secondary | ICD-10-CM | POA: Diagnosis not present

## 2018-07-29 DIAGNOSIS — X32XXXA Exposure to sunlight, initial encounter: Secondary | ICD-10-CM | POA: Diagnosis not present

## 2018-07-30 DIAGNOSIS — I10 Essential (primary) hypertension: Secondary | ICD-10-CM | POA: Diagnosis not present

## 2018-07-30 DIAGNOSIS — Z0000 Encounter for general adult medical examination without abnormal findings: Secondary | ICD-10-CM | POA: Diagnosis not present

## 2018-07-30 DIAGNOSIS — E119 Type 2 diabetes mellitus without complications: Secondary | ICD-10-CM | POA: Diagnosis not present

## 2018-08-26 DIAGNOSIS — C4441 Basal cell carcinoma of skin of scalp and neck: Secondary | ICD-10-CM | POA: Diagnosis not present

## 2018-08-31 NOTE — Progress Notes (Signed)
Cardiology Office Note  Date:  09/01/2018   ID:  Todd COLAIZZI, DOB 08-23-1940, MRN [id number]  PCP:  [name], MD   Chief Complaint  Patient presents with  . other    12 month f/u. Meds reviewed verbally with pt.    HPI:  [name] is a very pleasant 78 year old gentleman with  long history of smoking for 30-40 years,  alcohol use,  obesity,  chronic diastolic CHF,Echo March 6269 with ejection fraction 50-55% chronic atrial fibrillation,  hypertension,  type 2 diabetes,  Significant coronary disease by catheterization August 2015 three-vessel  sleep apnea, uses his CPAP periodically presenting for follow-up of his atrial fibrillation Lives alone with supportive family after wife unexpectedly passed away  Pressure at home 114/58 and higher In the office 148/80 at 8 AM today, takes pills at 7 Am Did not tolerate losartan 50 daily Went back to 25 daily, did not feel well  Labs: HBA1C 8.1 LDL 57, total chol 110 CR 1.6 GFR 44  Followed by nephrology No regular exercise Still working   Other past medical history reviewed Hospital admission 05/08/16 for PNA,sepsis, atrial fib with RVR On bipap For many days, ABX. Declined ventilator if needed acute kidney injury/ATN,  requiring CRRT Diltiazem dose increased at d/c up to 240 daily D/c on pradaxa (was going to change to eliquis) Off lisinopril/HCTZ, metformin  Labs at d/c  CR 1.64, BUN 33  total cholesterol well controlled, 130 or less  hospital admission 10/02/2013 with discharge August 12. He presented with shortness of breath, Atrial fibrillation with RVR. Echocardiogram showed normal ejection fraction. Symptoms improved with diuresis.  Echocardiogram 10/02/2013 showing ejection fraction 55-60%, mildly dilated left atrium, mild to moderate MR and TR  He had cardiac catheterization 10/06/2013 showing occluded diagonal vessel, 40% proximal LAD disease, bifurcating marginal branch/ramus. Ramus had  60% followed by 80% disease, OM with 50% mid disease at least. RCA with 40% mid disease. Medical management recommended   PMH:   has a past medical history of Acute renal failure ([place]), BPH (benign prostatic hyperplasia), Chronic atrial fibrillation, Chronic diastolic CHF (congestive heart failure) ([place]), Chronic insomnia, Coronary artery disease, non-occlusive (10/06/2013), Depression, Diabetes mellitus with complication ([place]), History of diverticulitis, Hyperkalemia, Hyperlipidemia, Hypertension, Malignant melanoma ([place]), Mitral regurgitation, and Sleep apnea in adult.  PSH:    Past Surgical History:  Procedure Laterality Date  . APPENDECTOMY    . CARDIAC CATHETERIZATION  10/06/2013  . COLONOSCOPY    . COLONOSCOPY WITH PROPOFOL N/A 11/23/2014   Procedure: COLONOSCOPY WITH PROPOFOL;  Surgeon: Manya Silvas, MD;  Location[facility] ENDOSCOPY;  Service: Endoscopy;  Laterality: N/A;  . MOHS SURGERY    . removal tubular adenoma      Current Outpatient Medications on File Prior to Visit  Medication Sig Dispense Refill  . acetaminophen (TYLENOL) 500 MG tablet Take 1,000 mg by mouth every 6 (six) hours as needed.    [name] apixaban (ELIQUIS) 5 MG TABS tablet Take 1 tablet (5 mg total) by mouth 2 (two) times daily. 180 tablet 3  . atorvastatin (LIPITOR) 20 MG tablet Take 20 mg by mouth daily.    . carvedilol (COREG) 25 MG tablet Take 1 tablet (25 mg total) by mouth 2 (two) times daily with a meal.    . cloNIDine (CATAPRES) 0.1 MG tablet TAKE 2 TABLETS BY MOUTH TWICE DAILY 360 tablet 3  . diltiazem (CARDIZEM CD) 120 MG 24 hr capsule Take 1 capsule (120 mg total) by mouth daily. 90 capsule  3  . famotidine (PEPCID) 20 MG tablet Take 1 tablet (20 mg total) by mouth at bedtime.    . furosemide (LASIX) 20 MG tablet Take 20 mg by mouth daily.    [name] glimepiride (AMARYL) 4 MG tablet Take 4 mg by mouth 2 (two) times daily.     . isosorbide mononitrate (IMDUR) 30 MG 24 hr tablet TAKE 1 TABLET BY MOUTH ONCE DAILY  90 tablet 2  . linagliptin (TRADJENTA) 5 MG TABS tablet Take 5 mg by mouth daily.    [name] losartan (COZAAR) 25 MG tablet Take 25 mg by mouth daily.    . magnesium oxide (MAG-OX) 400 MG tablet Take 400 mg by mouth daily.    . nitroGLYCERIN (NITROSTAT) 0.4 MG SL tablet Place 1 tablet (0.4 mg total) every 5 (five) minutes as needed under the tongue for chest pain. 25 tablet 3  . Potassium Chloride ER 20 MEQ TBCR Take 1 tablet by mouth once daily 90 tablet 0  . tamsulosin (FLOMAX) 0.4 MG CAPS capsule Take 0.4 mg by mouth daily.     . traZODone (DESYREL) 50 MG tablet Take 50 mg by mouth at bedtime as needed for sleep.    . cloNIDine (CATAPRES) 0.1 MG tablet Take 2 tablets (0.2 mg total) by mouth 2 (two) times daily. (Patient not taking: Reported on 09/01/2018) 360 tablet 3  . ELIQUIS 5 MG TABS tablet Take 1 tablet by mouth twice daily (Patient not taking: Reported on 09/01/2018) 60 tablet 1   No current facility-administered medications on file prior to visit.     Allergies:   Ciprofloxacin and Sitagliptin   Social History:  The patient  reports that he has quit smoking. He has never used smokeless tobacco. He reports current alcohol use. He reports that he does not use drugs.   Family History:   Family history is unknown by patient.    Review of Systems: Review of Systems  Constitutional: Negative.   Respiratory: Negative.   Cardiovascular: Positive for leg swelling.  Gastrointestinal: Negative.   Musculoskeletal: Negative.        Gait instability  Neurological: Negative.   Psychiatric/Behavioral: Negative.   All other systems reviewed and are negative.    PHYSICAL EXAM: VS:  BP (!) 148/80 (BP Location: Left Arm, Patient Position: Sitting, Cuff Size: Normal)   Pulse 70   Resp 18   Ht 5\' 9"  (1.753 m)   Wt 209 lb 8 oz (95 kg)   SpO2 98%   BMI 30.94 kg/m  , BMI Body mass index is 30.94 kg/m. Constitutional:  oriented to person, place, and time. No distress.  HENT:  Head:  Normocephalic and atraumatic.  Eyes:  no discharge. No scleral icterus.  Neck: Normal range of motion. Neck supple. No JVD present.  Cardiovascular:  Irregularly irregular; 1- 2+ SEM RSB no rubs, or gallops,trace pitting edema bilateralabove the sock line Pulmonary/Chest: Effort normal and breath sounds normal. No stridor. No respiratory distress.  no wheezes.  no rales.  no tenderness.  Abdominal: Soft.  no distension.  no tenderness.  Musculoskeletal: Normal range of motion.  no  tenderness or deformity.  Neurological:  normal muscle tone. Coordination normal. No atrophy Skin: Skin is warm and dry. No rash noted. not diaphoretic.  Psychiatric:  normal mood and affect. behavior is normal. Thought content normal.    Recent Labs: No results found for requested labs within last 8760 hours.    Lipid Panel Lab Results  Component Value Date  CHOL 97 (L) 08/25/2017   HDL 30 (L) 08/25/2017   LDLCALC 41 08/25/2017   TRIG 128 08/25/2017      Wt Readings from Last 3 Encounters:  09/01/18 209 lb 8 oz (95 kg)  08/25/17 215 lb 4 oz (97.6 kg)  02/06/17 209 lb 4 oz (94.9 kg)      ASSESSMENT AND PLAN:   Chronic atrial fibrillation ([name]) - Plan: EKG 12-Lead Rate controlled, on eliquis  Essential hypertension Mildly elevated, Did not tolerate losartan 50 daily  Acute on chronic diastolic CHF (congestive heart failure) ([name]) - Lasix daily, stable renal function creatinine 1.6  Mixed hyperlipidemia - Plan: EKG 12-Lead Cholesterol is at goal on the current lipid regimen. No changes to the medications were made.  Type 2 diabetes mellitus with complication, without long-term current use of insulin ([name]) - Plan: EKG 12-Lead Hemoglobin A1c elevated, Suggested diet changes  Acute on chronic renal failure  baseline 1.6 Follow-up with nephrology  Dr. Holley Raring on losartan    Total encounter time more than 25 minutes  Greater than 50% was spent in counseling and coordination of care with  the patient   Disposition:   F/U  12 months   Orders Placed This Encounter  Procedures  . EKG 12-Lead     Signed, Esmond Plants, M.D., Ph.D. 09/01/2018  [place], [place]

## 2018-09-01 ENCOUNTER — Other Ambulatory Visit: Payer: Self-pay

## 2018-09-01 ENCOUNTER — Ambulatory Visit (INDEPENDENT_AMBULATORY_CARE_PROVIDER_SITE_OTHER): Payer: Medicare HMO | Admitting: Cardiovascular Disease

## 2018-09-01 ENCOUNTER — Encounter: Payer: Self-pay | Admitting: Cardiovascular Disease

## 2018-09-01 VITALS — BP 148/80 | HR 70 | Temp 98.4°F | Resp 18 | Ht 69.0 in | Wt 209.5 lb

## 2018-09-01 DIAGNOSIS — I10 Essential (primary) hypertension: Secondary | ICD-10-CM

## 2018-09-01 DIAGNOSIS — I2510 Atherosclerotic heart disease of native coronary artery without angina pectoris: Secondary | ICD-10-CM

## 2018-09-01 DIAGNOSIS — I482 Chronic atrial fibrillation, unspecified: Secondary | ICD-10-CM | POA: Diagnosis not present

## 2018-09-01 DIAGNOSIS — E118 Type 2 diabetes mellitus with unspecified complications: Secondary | ICD-10-CM | POA: Diagnosis not present

## 2018-09-01 DIAGNOSIS — R600 Localized edema: Secondary | ICD-10-CM | POA: Diagnosis not present

## 2018-09-01 DIAGNOSIS — I5033 Acute on chronic diastolic (congestive) heart failure: Secondary | ICD-10-CM | POA: Diagnosis not present

## 2018-09-01 DIAGNOSIS — R0602 Shortness of breath: Secondary | ICD-10-CM

## 2018-09-01 DIAGNOSIS — E782 Mixed hyperlipidemia: Secondary | ICD-10-CM

## 2018-09-01 NOTE — Patient Instructions (Signed)

## 2018-09-04 ENCOUNTER — Other Ambulatory Visit: Payer: Self-pay | Admitting: Cardiovascular Disease

## 2018-09-09 DIAGNOSIS — C44712 Basal cell carcinoma of skin of right lower limb, including hip: Secondary | ICD-10-CM | POA: Diagnosis not present

## 2018-09-09 DIAGNOSIS — C44519 Basal cell carcinoma of skin of other part of trunk: Secondary | ICD-10-CM | POA: Diagnosis not present

## 2018-09-09 DIAGNOSIS — C44619 Basal cell carcinoma of skin of left upper limb, including shoulder: Secondary | ICD-10-CM | POA: Diagnosis not present

## 2018-09-09 DIAGNOSIS — D0471 Carcinoma in situ of skin of right lower limb, including hip: Secondary | ICD-10-CM | POA: Diagnosis not present

## 2018-09-09 DIAGNOSIS — D0461 Carcinoma in situ of skin of right upper limb, including shoulder: Secondary | ICD-10-CM | POA: Diagnosis not present

## 2018-09-23 DIAGNOSIS — C44311 Basal cell carcinoma of skin of nose: Secondary | ICD-10-CM | POA: Diagnosis not present

## 2018-09-25 ENCOUNTER — Encounter: Payer: Self-pay | Admitting: Emergency Medicine

## 2018-09-25 ENCOUNTER — Other Ambulatory Visit: Payer: Self-pay

## 2018-09-25 ENCOUNTER — Inpatient Hospital Stay
Admission: EM | Admit: 2018-09-25 | Discharge: 2018-10-02 | DRG: 537 | Disposition: A | Discharge status: Skilled Nursing Facility | Payer: Medicare HMO | Attending: Specialist | Admitting: Specialist

## 2018-09-25 ENCOUNTER — Emergency Department: Payer: Medicare HMO

## 2018-09-25 DIAGNOSIS — W1839XA Other fall on same level, initial encounter: Secondary | ICD-10-CM | POA: Diagnosis present

## 2018-09-25 DIAGNOSIS — N400 Enlarged prostate without lower urinary tract symptoms: Secondary | ICD-10-CM | POA: Diagnosis present

## 2018-09-25 DIAGNOSIS — S76319A Strain of muscle, fascia and tendon of the posterior muscle group at thigh level, unspecified thigh, initial encounter: Secondary | ICD-10-CM

## 2018-09-25 DIAGNOSIS — I340 Nonrheumatic mitral (valve) insufficiency: Secondary | ICD-10-CM | POA: Diagnosis present

## 2018-09-25 DIAGNOSIS — Z79891 Long term (current) use of opiate analgesic: Secondary | ICD-10-CM

## 2018-09-25 DIAGNOSIS — E785 Hyperlipidemia, unspecified: Secondary | ICD-10-CM | POA: Diagnosis present

## 2018-09-25 DIAGNOSIS — D62 Acute posthemorrhagic anemia: Secondary | ICD-10-CM | POA: Diagnosis present

## 2018-09-25 DIAGNOSIS — G4730 Sleep apnea, unspecified: Secondary | ICD-10-CM | POA: Diagnosis present

## 2018-09-25 DIAGNOSIS — I4891 Unspecified atrial fibrillation: Secondary | ICD-10-CM | POA: Diagnosis not present

## 2018-09-25 DIAGNOSIS — S8011XA Contusion of right lower leg, initial encounter: Secondary | ICD-10-CM | POA: Diagnosis present

## 2018-09-25 DIAGNOSIS — E1122 Type 2 diabetes mellitus with diabetic chronic kidney disease: Secondary | ICD-10-CM | POA: Diagnosis present

## 2018-09-25 DIAGNOSIS — Z20828 Contact with and (suspected) exposure to other viral communicable diseases: Secondary | ICD-10-CM | POA: Diagnosis present

## 2018-09-25 DIAGNOSIS — Z886 Allergy status to analgesic agent status: Secondary | ICD-10-CM

## 2018-09-25 DIAGNOSIS — S76311A Strain of muscle, fascia and tendon of the posterior muscle group at thigh level, right thigh, initial encounter: Principal | ICD-10-CM | POA: Diagnosis present

## 2018-09-25 DIAGNOSIS — R262 Difficulty in walking, not elsewhere classified: Secondary | ICD-10-CM

## 2018-09-25 DIAGNOSIS — F5104 Psychophysiologic insomnia: Secondary | ICD-10-CM | POA: Diagnosis present

## 2018-09-25 DIAGNOSIS — M79651 Pain in right thigh: Secondary | ICD-10-CM | POA: Diagnosis not present

## 2018-09-25 DIAGNOSIS — T402X5A Adverse effect of other opioids, initial encounter: Secondary | ICD-10-CM | POA: Diagnosis not present

## 2018-09-25 DIAGNOSIS — R2689 Other abnormalities of gait and mobility: Secondary | ICD-10-CM | POA: Diagnosis not present

## 2018-09-25 DIAGNOSIS — M79604 Pain in right leg: Secondary | ICD-10-CM | POA: Diagnosis present

## 2018-09-25 DIAGNOSIS — Z03818 Encounter for observation for suspected exposure to other biological agents ruled out: Secondary | ICD-10-CM | POA: Diagnosis not present

## 2018-09-25 DIAGNOSIS — Z881 Allergy status to other antibiotic agents status: Secondary | ICD-10-CM

## 2018-09-25 DIAGNOSIS — Z888 Allergy status to other drugs, medicaments and biological substances status: Secondary | ICD-10-CM

## 2018-09-25 DIAGNOSIS — I5032 Chronic diastolic (congestive) heart failure: Secondary | ICD-10-CM | POA: Diagnosis present

## 2018-09-25 DIAGNOSIS — R2681 Unsteadiness on feet: Secondary | ICD-10-CM | POA: Diagnosis not present

## 2018-09-25 DIAGNOSIS — S8991XA Unspecified injury of right lower leg, initial encounter: Secondary | ICD-10-CM | POA: Diagnosis not present

## 2018-09-25 DIAGNOSIS — Z85820 Personal history of malignant melanoma of skin: Secondary | ICD-10-CM

## 2018-09-25 DIAGNOSIS — Z7901 Long term (current) use of anticoagulants: Secondary | ICD-10-CM

## 2018-09-25 DIAGNOSIS — I2510 Atherosclerotic heart disease of native coronary artery without angina pectoris: Secondary | ICD-10-CM | POA: Diagnosis present

## 2018-09-25 DIAGNOSIS — E118 Type 2 diabetes mellitus with unspecified complications: Secondary | ICD-10-CM

## 2018-09-25 DIAGNOSIS — I130 Hypertensive heart and chronic kidney disease with heart failure and stage 1 through stage 4 chronic kidney disease, or unspecified chronic kidney disease: Secondary | ICD-10-CM | POA: Diagnosis present

## 2018-09-25 DIAGNOSIS — I482 Chronic atrial fibrillation, unspecified: Secondary | ICD-10-CM | POA: Diagnosis present

## 2018-09-25 DIAGNOSIS — N184 Chronic kidney disease, stage 4 (severe): Secondary | ICD-10-CM | POA: Diagnosis present

## 2018-09-25 DIAGNOSIS — Z87891 Personal history of nicotine dependence: Secondary | ICD-10-CM

## 2018-09-25 DIAGNOSIS — Z7984 Long term (current) use of oral hypoglycemic drugs: Secondary | ICD-10-CM

## 2018-09-25 DIAGNOSIS — R443 Hallucinations, unspecified: Secondary | ICD-10-CM | POA: Diagnosis not present

## 2018-09-25 DIAGNOSIS — S76301A Unspecified injury of muscle, fascia and tendon of the posterior muscle group at thigh level, right thigh, initial encounter: Secondary | ICD-10-CM | POA: Diagnosis not present

## 2018-09-25 DIAGNOSIS — Z79899 Other long term (current) drug therapy: Secondary | ICD-10-CM

## 2018-09-25 LAB — CBC
HCT: 42.1 % (ref 39.0–52.0)
Hemoglobin: 13.9 g/dL (ref 13.0–17.0)
MCH: 29.6 pg (ref 26.0–34.0)
MCHC: 33 g/dL (ref 30.0–36.0)
MCV: 89.8 fL (ref 80.0–100.0)
Platelets: 141 10*3/uL — ABNORMAL LOW (ref 150–400)
RBC: 4.69 MIL/uL (ref 4.22–5.81)
RDW: 12.8 % (ref 11.5–15.5)
WBC: 11.3 10*3/uL — ABNORMAL HIGH (ref 4.0–10.5)
nRBC: 0 % (ref 0.0–0.2)

## 2018-09-25 LAB — BASIC METABOLIC PANEL
Anion gap: 8 (ref 5–15)
BUN: 20 mg/dL (ref 8–23)
CO2: 27 mmol/L (ref 22–32)
Calcium: 9 mg/dL (ref 8.9–10.3)
Chloride: 103 mmol/L (ref 98–111)
Creatinine, Ser: 1.36 mg/dL — ABNORMAL HIGH (ref 0.61–1.24)
GFR calc Af Amer: 58 mL/min — ABNORMAL LOW (ref >60)
GFR calc non Af Amer: 50 mL/min — ABNORMAL LOW (ref >60)
Glucose, Bld: 162 mg/dL — ABNORMAL HIGH (ref 70–99)
Potassium: 4 mmol/L (ref 3.5–5.1)
Sodium: 138 mmol/L (ref 135–145)

## 2018-09-25 MED ORDER — HYDROCODONE-ACETAMINOPHEN 5-325 MG PO TABS
1.0000 | ORAL_TABLET | ORAL | Status: AC
  Administered 2018-09-25: 18:00:00 1 via ORAL
  Filled 2018-09-25: qty 1

## 2018-09-25 MED ORDER — OXYCODONE-ACETAMINOPHEN 5-325 MG PO TABS
1.0000 | ORAL_TABLET | Freq: Once | ORAL | Status: AC
  Administered 2018-09-25: 20:00:00 1 via ORAL
  Filled 2018-09-25: qty 1
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
## 2018-09-25 NOTE — ED Notes (Signed)
Attempted to get patient to stand and use walker. Pt was unable to stand or bear weight on his right leg. EDP notified.

## 2018-09-25 NOTE — ED Notes (Signed)
See triage note. Pt indicates pain primarily at proximal end of hamstring. Pt states he felt a "pulling" sensation as he rotated to throw the sign and then was not able to bear weight. Pt denies any previous Hx of similar Sx or injuries. Pt denies testicular pain, distal sensoryneuro defecits. Upon assessment, Pt a&ox4, NAD, no respiratory Sx evident.
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
## 2018-09-26 ENCOUNTER — Inpatient Hospital Stay: Payer: Medicare HMO

## 2018-09-26 DIAGNOSIS — Z886 Allergy status to analgesic agent status: Secondary | ICD-10-CM | POA: Diagnosis not present

## 2018-09-26 DIAGNOSIS — E119 Type 2 diabetes mellitus without complications: Secondary | ICD-10-CM | POA: Diagnosis not present

## 2018-09-26 DIAGNOSIS — Z20828 Contact with and (suspected) exposure to other viral communicable diseases: Secondary | ICD-10-CM | POA: Diagnosis not present

## 2018-09-26 DIAGNOSIS — D62 Acute posthemorrhagic anemia: Secondary | ICD-10-CM | POA: Diagnosis not present

## 2018-09-26 DIAGNOSIS — F5104 Psychophysiologic insomnia: Secondary | ICD-10-CM | POA: Diagnosis present

## 2018-09-26 DIAGNOSIS — I482 Chronic atrial fibrillation, unspecified: Secondary | ICD-10-CM | POA: Diagnosis not present

## 2018-09-26 DIAGNOSIS — M79604 Pain in right leg: Secondary | ICD-10-CM | POA: Diagnosis present

## 2018-09-26 DIAGNOSIS — I5032 Chronic diastolic (congestive) heart failure: Secondary | ICD-10-CM | POA: Diagnosis not present

## 2018-09-26 DIAGNOSIS — E1122 Type 2 diabetes mellitus with diabetic chronic kidney disease: Secondary | ICD-10-CM | POA: Diagnosis present

## 2018-09-26 DIAGNOSIS — W19XXXA Unspecified fall, initial encounter: Secondary | ICD-10-CM | POA: Diagnosis not present

## 2018-09-26 DIAGNOSIS — R52 Pain, unspecified: Secondary | ICD-10-CM | POA: Diagnosis not present

## 2018-09-26 DIAGNOSIS — I10 Essential (primary) hypertension: Secondary | ICD-10-CM | POA: Diagnosis not present

## 2018-09-26 DIAGNOSIS — R443 Hallucinations, unspecified: Secondary | ICD-10-CM | POA: Diagnosis not present

## 2018-09-26 DIAGNOSIS — Z7984 Long term (current) use of oral hypoglycemic drugs: Secondary | ICD-10-CM | POA: Diagnosis not present

## 2018-09-26 DIAGNOSIS — I509 Heart failure, unspecified: Secondary | ICD-10-CM | POA: Diagnosis not present

## 2018-09-26 DIAGNOSIS — Z79891 Long term (current) use of opiate analgesic: Secondary | ICD-10-CM | POA: Diagnosis not present

## 2018-09-26 DIAGNOSIS — S8991XA Unspecified injury of right lower leg, initial encounter: Secondary | ICD-10-CM | POA: Diagnosis not present

## 2018-09-26 DIAGNOSIS — R262 Difficulty in walking, not elsewhere classified: Secondary | ICD-10-CM | POA: Diagnosis not present

## 2018-09-26 DIAGNOSIS — I2510 Atherosclerotic heart disease of native coronary artery without angina pectoris: Secondary | ICD-10-CM | POA: Diagnosis present

## 2018-09-26 DIAGNOSIS — I340 Nonrheumatic mitral (valve) insufficiency: Secondary | ICD-10-CM | POA: Diagnosis present

## 2018-09-26 DIAGNOSIS — G4730 Sleep apnea, unspecified: Secondary | ICD-10-CM | POA: Diagnosis present

## 2018-09-26 DIAGNOSIS — S76311D Strain of muscle, fascia and tendon of the posterior muscle group at thigh level, right thigh, subsequent encounter: Secondary | ICD-10-CM | POA: Diagnosis not present

## 2018-09-26 DIAGNOSIS — Z79899 Other long term (current) drug therapy: Secondary | ICD-10-CM | POA: Diagnosis not present

## 2018-09-26 DIAGNOSIS — E669 Obesity, unspecified: Secondary | ICD-10-CM | POA: Diagnosis not present

## 2018-09-26 DIAGNOSIS — Z7401 Bed confinement status: Secondary | ICD-10-CM | POA: Diagnosis not present

## 2018-09-26 DIAGNOSIS — W1839XA Other fall on same level, initial encounter: Secondary | ICD-10-CM | POA: Diagnosis not present

## 2018-09-26 DIAGNOSIS — M79651 Pain in right thigh: Secondary | ICD-10-CM | POA: Diagnosis not present

## 2018-09-26 DIAGNOSIS — I959 Hypotension, unspecified: Secondary | ICD-10-CM | POA: Diagnosis not present

## 2018-09-26 DIAGNOSIS — Z7901 Long term (current) use of anticoagulants: Secondary | ICD-10-CM | POA: Diagnosis not present

## 2018-09-26 DIAGNOSIS — N183 Chronic kidney disease, stage 3 (moderate): Secondary | ICD-10-CM | POA: Diagnosis not present

## 2018-09-26 DIAGNOSIS — I4891 Unspecified atrial fibrillation: Secondary | ICD-10-CM | POA: Diagnosis not present

## 2018-09-26 DIAGNOSIS — S8011XA Contusion of right lower leg, initial encounter: Secondary | ICD-10-CM | POA: Diagnosis not present

## 2018-09-26 DIAGNOSIS — Z85820 Personal history of malignant melanoma of skin: Secondary | ICD-10-CM | POA: Diagnosis not present

## 2018-09-26 DIAGNOSIS — I130 Hypertensive heart and chronic kidney disease with heart failure and stage 1 through stage 4 chronic kidney disease, or unspecified chronic kidney disease: Secondary | ICD-10-CM | POA: Diagnosis not present

## 2018-09-26 DIAGNOSIS — Z87891 Personal history of nicotine dependence: Secondary | ICD-10-CM | POA: Diagnosis not present

## 2018-09-26 DIAGNOSIS — E876 Hypokalemia: Secondary | ICD-10-CM | POA: Diagnosis not present

## 2018-09-26 DIAGNOSIS — M2550 Pain in unspecified joint: Secondary | ICD-10-CM | POA: Diagnosis not present

## 2018-09-26 DIAGNOSIS — M6281 Muscle weakness (generalized): Secondary | ICD-10-CM | POA: Diagnosis not present

## 2018-09-26 DIAGNOSIS — E785 Hyperlipidemia, unspecified: Secondary | ICD-10-CM | POA: Diagnosis not present

## 2018-09-26 DIAGNOSIS — N400 Enlarged prostate without lower urinary tract symptoms: Secondary | ICD-10-CM | POA: Diagnosis present

## 2018-09-26 DIAGNOSIS — Z881 Allergy status to other antibiotic agents status: Secondary | ICD-10-CM | POA: Diagnosis not present

## 2018-09-26 DIAGNOSIS — S76311A Strain of muscle, fascia and tendon of the posterior muscle group at thigh level, right thigh, initial encounter: Secondary | ICD-10-CM | POA: Diagnosis not present

## 2018-09-26 DIAGNOSIS — N184 Chronic kidney disease, stage 4 (severe): Secondary | ICD-10-CM | POA: Diagnosis not present

## 2018-09-26 LAB — SARS CORONAVIRUS 2 (TAT 6-24 HRS): SARS Coronavirus 2: NEGATIVE

## 2018-09-26 MED ORDER — ACETAMINOPHEN 650 MG RE SUPP: 650.0000 mg | Freq: Four times a day (QID) | RECTAL | Status: DC | PRN

## 2018-09-26 MED ORDER — POLYETHYLENE GLYCOL 3350 17 G PO PACK
17.0000 g | PACK | Freq: Every day | ORAL | Status: DC | PRN
  Administered 2018-09-29 – 2018-10-01 (×2): 17 g via ORAL
  Filled 2018-09-26 (×2): qty 1

## 2018-09-26 MED ORDER — CARVEDILOL 25 MG PO TABS
25.0000 mg | ORAL_TABLET | Freq: Two times a day (BID) | ORAL | Status: DC
  Administered 2018-09-26 – 2018-10-02 (×13): 25 mg via ORAL
  Filled 2018-09-26 (×13): qty 1

## 2018-09-26 MED ORDER — HYDROCODONE-ACETAMINOPHEN 5-325 MG PO TABS
1.0000 | ORAL_TABLET | ORAL | Status: DC | PRN
  Administered 2018-09-26: 2 via ORAL
  Administered 2018-09-26 (×2): 1 via ORAL
  Administered 2018-09-27: 2 via ORAL
  Administered 2018-09-27: 1 via ORAL
  Administered 2018-09-27 – 2018-09-28 (×3): 2 via ORAL
  Administered 2018-09-28 – 2018-09-29 (×5): 1 via ORAL
  Filled 2018-09-26: qty 1
  Filled 2018-09-26: qty 2
  Filled 2018-09-26: qty 1
  Filled 2018-09-26: qty 2
  Filled 2018-09-26: qty 1
  Filled 2018-09-26 (×3): qty 2
  Filled 2018-09-26 (×5): qty 1

## 2018-09-26 MED ORDER — CLONIDINE HCL 0.1 MG PO TABS
0.2000 mg | ORAL_TABLET | Freq: Two times a day (BID) | ORAL | Status: DC
  Administered 2018-09-26 – 2018-10-02 (×12): 0.2 mg via ORAL
  Filled 2018-09-26 (×12): qty 2

## 2018-09-26 MED ORDER — ONDANSETRON HCL 4 MG PO TABS: 4.0000 mg | ORAL_TABLET | Freq: Four times a day (QID) | ORAL | Status: DC | PRN

## 2018-09-26 MED ORDER — GLIMEPIRIDE 4 MG PO TABS
4.0000 mg | ORAL_TABLET | Freq: Two times a day (BID) | ORAL | Status: DC
  Administered 2018-09-26 – 2018-09-27 (×4): 4 mg via ORAL
  Filled 2018-09-26 (×5): qty 1

## 2018-09-26 MED ORDER — MAGNESIUM OXIDE 400 (241.3 MG) MG PO TABS
400.0000 mg | ORAL_TABLET | Freq: Every day | ORAL | Status: DC
  Administered 2018-09-26 – 2018-10-02 (×7): 400 mg via ORAL
  Filled 2018-09-26 (×7): qty 1

## 2018-09-26 MED ORDER — LOSARTAN POTASSIUM 50 MG PO TABS
50.0000 mg | ORAL_TABLET | Freq: Every day | ORAL | Status: DC
  Administered 2018-09-26 – 2018-09-27 (×2): 50 mg via ORAL
  Filled 2018-09-26 (×2): qty 1

## 2018-09-26 MED ORDER — DILTIAZEM HCL ER COATED BEADS 120 MG PO CP24
120.0000 mg | ORAL_CAPSULE | Freq: Every day | ORAL | Status: DC
  Administered 2018-09-26 – 2018-10-02 (×6): 120 mg via ORAL
  Filled 2018-09-26 (×7): qty 1

## 2018-09-26 MED ORDER — ISOSORBIDE MONONITRATE ER 30 MG PO TB24
30.0000 mg | ORAL_TABLET | Freq: Every day | ORAL | Status: DC
  Administered 2018-09-26 – 2018-10-02 (×6): 30 mg via ORAL
  Filled 2018-09-26 (×8): qty 1

## 2018-09-26 MED ORDER — ATORVASTATIN CALCIUM 20 MG PO TABS
20.0000 mg | ORAL_TABLET | Freq: Every day | ORAL | Status: DC
  Administered 2018-09-26 – 2018-10-01 (×6): 20 mg via ORAL
  Filled 2018-09-26 (×6): qty 1

## 2018-09-26 MED ORDER — POTASSIUM CHLORIDE ER 20 MEQ PO TBCR: 1.0000 | EXTENDED_RELEASE_TABLET | Freq: Every day | ORAL | Status: DC

## 2018-09-26 MED ORDER — SODIUM CHLORIDE 0.9% FLUSH
3.0000 mL | Freq: Two times a day (BID) | INTRAVENOUS | Status: DC
  Administered 2018-09-26 – 2018-09-30 (×10): 3 mL via INTRAVENOUS

## 2018-09-26 MED ORDER — ACETAMINOPHEN 325 MG PO TABS
650.0000 mg | ORAL_TABLET | Freq: Four times a day (QID) | ORAL | Status: DC | PRN
  Administered 2018-09-29 – 2018-10-01 (×5): 650 mg via ORAL
  Filled 2018-09-26 (×5): qty 2

## 2018-09-26 MED ORDER — TAMSULOSIN HCL 0.4 MG PO CAPS
0.4000 mg | ORAL_CAPSULE | Freq: Every day | ORAL | Status: DC
  Administered 2018-09-26 – 2018-10-02 (×7): 0.4 mg via ORAL
  Filled 2018-09-26 (×8): qty 1

## 2018-09-26 MED ORDER — FUROSEMIDE 40 MG PO TABS
20.0000 mg | ORAL_TABLET | Freq: Every day | ORAL | Status: DC
  Administered 2018-09-26 – 2018-09-27 (×2): 20 mg via ORAL
  Filled 2018-09-26 (×2): qty 1

## 2018-09-26 MED ORDER — ONDANSETRON HCL 4 MG/2ML IJ SOLN: 4.0000 mg | Freq: Four times a day (QID) | INTRAMUSCULAR | Status: DC | PRN

## 2018-09-26 MED ORDER — FAMOTIDINE 20 MG PO TABS
20.0000 mg | ORAL_TABLET | Freq: Every day | ORAL | Status: DC
  Administered 2018-09-26 – 2018-10-01 (×7): 20 mg via ORAL
  Filled 2018-09-26 (×7): qty 1

## 2018-09-26 MED ORDER — APIXABAN 5 MG PO TABS
5.0000 mg | ORAL_TABLET | Freq: Two times a day (BID) | ORAL | Status: DC
  Administered 2018-09-26 – 2018-09-27 (×5): 5 mg via ORAL
  Filled 2018-09-26 (×5): qty 1

## 2018-09-26 MED ORDER — LINAGLIPTIN 5 MG PO TABS
5.0000 mg | ORAL_TABLET | Freq: Every day | ORAL | Status: DC
  Administered 2018-09-26 – 2018-10-02 (×7): 5 mg via ORAL
  Filled 2018-09-26 (×7): qty 1

## 2018-09-26 MED ORDER — METOPROLOL TARTRATE 5 MG/5ML IV SOLN: 5.0000 mg | INTRAVENOUS | Status: DC | PRN

## 2018-09-26 NOTE — Progress Notes (Signed)
[facility] [place] NAME: Todd Valentine    MR#:  [id number]  DATE OF BIRTH:  02-16-1941  SUBJECTIVE:  CHIEF COMPLAINT:    REVIEW OF SYSTEMS:  CONSTITUTIONAL: No fever, fatigue or weakness.  EYES: No blurred or double vision.  EARS, NOSE, AND THROAT: No tinnitus or ear pain.  RESPIRATORY: No cough, shortness of breath, wheezing or hemoptysis.  CARDIOVASCULAR: No chest pain, orthopnea, edema.  GASTROINTESTINAL: No nausea, vomiting, diarrhea or abdominal pain.  GENITOURINARY: No dysuria, hematuria.  ENDOCRINE: No polyuria, nocturia,  HEMATOLOGY: No anemia, easy bruising or bleeding SKIN: No rash or lesion. MUSCULOSKELETAL: No joint pain or arthritis.   NEUROLOGIC: No tingling, numbness, weakness.  PSYCHIATRY: No anxiety or depression.   DRUG ALLERGIES:   Allergies  Allergen Reactions  . Aspirin Other (See Comments)    Contraindicated - Kidney disease  . Ciprofloxacin Other (See Comments)    Other reaction(s): Other (See Comments) Pt states he could hardly move Pt states he could hardly move Pt states he could hardly move Other reaction(s): Other (See Comments) Pt states he could hardly move   . Sitagliptin Other (See Comments)    Other reaction(s): Other (See Comments) Weakness Weakness Weakness Other reaction(s): Other (See Comments) Weakness     VITALS:  Blood pressure (!) 178/101, pulse 93, temperature 97.9 F (36.6 C), resp. rate 18, height 5' 8.5" (1.74 m), weight 92.1 kg, SpO2 93 %.  PHYSICAL EXAMINATION:  GENERAL:  78 y.o.-year-old patient lying in the bed with no acute distress.  EYES: Pupils equal, round, reactive to light and accommodation. No scleral icterus. Extraocular muscles intact.  HEENT: Head atraumatic, normocephalic. Oropharynx and nasopharynx clear.  NECK:  Supple, no jugular venous distention. No thyroid enlargement, no tenderness.  LUNGS: Normal breath sounds bilaterally, no wheezing,  rales,rhonchi or crepitation. No use of accessory muscles of respiration.  CARDIOVASCULAR: S1, S2 normal. No murmurs, rubs, or gallops.  ABDOMEN: Soft, nontender, nondistended. Bowel sounds present. No organomegaly or mass.  EXTREMITIES: No pedal edema, cyanosis, or clubbing.  NEUROLOGIC: Cranial nerves II through XII are intact. Muscle strength 5/5 in all extremities. Sensation intact. Gait not checked.  PSYCHIATRIC: The patient is alert and oriented x 3.  SKIN: No obvious rash, lesion, or ulcer.    LABORATORY PANEL:   CBC Recent Labs  Lab 09/25/18 1959  WBC 11.3*  HGB 13.9  HCT 42.1  PLT 141*   ------------------------------------------------------------------------------------------------------------------  Chemistries  Recent Labs  Lab 09/25/18 1959  NA 138  K 4.0  CL 103  CO2 27  GLUCOSE 162*  BUN 20  CREATININE 1.36*  CALCIUM 9.0   ------------------------------------------------------------------------------------------------------------------  Cardiac Enzymes No results for input(s): TROPONINI in the last 168 hours. ------------------------------------------------------------------------------------------------------------------  RADIOLOGY:  Mr Frmur Right Wo Contrast  Result Date: 09/26/2018 CLINICAL DATA:  Acute onset of right posterior thigh pain. Twisted leg and felt a popping sensation in his thigh. EXAM: MRI OF THE RIGHT FEMUR WITHOUT CONTRAST TECHNIQUE: Multiplanar, multisequence MR imaging of the right femur was performed. No intravenous contrast was administered. COMPARISON:  Radiographs, same date. FINDINGS: The hamstring tendons are completely torn from their attachment site on the ischium. Both the conjoined tendon of the biceps femoris and semitendinosis tendons and the semimembranosus tendon are completely torn and retracted at least 4 cm. There is extensive edema and hemorrhage extending back into the hamstring muscles. There also fairly extensive  muscle tears involving all of the adductor muscles with significant edema, hemorrhage  and fluid. The quadratus femoris muscle is also torn and the inferior aspect of the gluteus maximus muscle is torn. Moderate fatty atrophy noted of the rectus femoris muscle may be related to previous trauma or denervation. Similar findings involving the gluteus medius muscle. Both hips are normally located. No fractures or AVN. The pubic symphysis and SI joints are intact. No femur fractures. IMPRESSION: 1. Completely ruptured and retracted hamstring tendons from their attachment site on the ischium. There is extensive associated hemorrhage, fluid and edema extending all the way down the hamstring muscles in the posterior compartment of the thigh. 2. Extensively torn adductor muscles, quadratus femoris muscle and gluteus maximus muscle. 3. No acute bony findings. Electronically Signed   By: Marijo Sanes M.D.   On: 09/26/2018 11:42   Dg Femur Min 2 Views Right  Result Date: 09/25/2018 CLINICAL DATA:  Posterior thigh pain EXAM: RIGHT FEMUR 2 VIEWS COMPARISON:  None. FINDINGS: There is no evidence of fracture or other focal bone lesions. Soft tissues are unremarkable. Atherosclerotic vascular calcifications noted. IMPRESSION: No fracture of the RIGHT femur.  No aggressive osseous lesion. Atherosclerotic vascular calcifications noted. Electronically Signed   By: Suzy Bouchard M.D.   On: 09/25/2018 18:00    EKG:   Orders placed or performed in visit on 09/01/18  . EKG 12-Lead    ASSESSMENT AND PLAN:    .  Right lower extremity injury-from completely ruptured and retracted hamstring tendon from their attachment site on the ischium -  Pain management as needed - consult orthopedic services - Physical therapy consultation for supportive care  2.  Right lower extremity pain - We will treat with analgesic as needed  3.  Chronic atrial fibrillation on Eliquis - We will monitor closely for evidence of  hematoma -Patient is on telemetry monitoring  4.    Hypertension blood pressure is elevated could be from the uncontrolled pain - Needs good  pain management IV Lopressor as needed  DVT and PPI prophylaxis    All the records are reviewed and case discussed with Care Management/Social Workerr. Management plans discussed with the patient, family and they are in agreement.  CODE STATUS: fc   TOTAL TIME TAKING CARE OF THIS PATIENT: 36  minutes.   POSSIBLE D/C IN 2  DAYS, DEPENDING ON CLINICAL CONDITION.  Note: This dictation was prepared with Dragon dictation along with smaller phrase technology. Any transcriptional errors that result from this process are unintentional.   [name] M.D on 09/26/2018 at 2:32 PM  Between 7am to 6pm - Pager - [phone] After 6pm go to [email or web address] - password [email or web address] [facility]  Office  [phone]  CC: Primary care physician; [name], MD

## 2018-09-26 NOTE — H&P (Signed)
[name]    MR#:  [id number]  DATE OF BIRTH:  1941/02/09  DATE OF ADMISSION:  09/25/2018  PRIMARY CARE PHYSICIAN: [name], MD   REQUESTING/REFERRING PHYSICIAN: Dorise Hiss, PA-C  CHIEF COMPLAINT:   Chief Complaint  Patient presents with  . Leg Pain    HISTORY OF PRESENT ILLNESS:  Todd Valentine  is a 78 y.o. male with a known history of BPH, chronic atrial fibrillation, chronic diastolic CHF.  He presented to the emergency room complaining of acute right posterior thigh pain.  He [place] property approximately 1 hour prior to his arrival to the emergency room when he twisted his right leg and felt a "popping noise "in the mid posterior right thigh area.  He fell to the ground with pain however, he denies injuring any other part of his body.  Patient reports since that time, he has been unable to bear weight onto his right lower extremity particularly his right thigh area with severe pain.  There is some edema to the area with severe tenderness on palpation.  The patient is on Eliquis for history of atrial fibrillation.  Pain is made worse with extension of his right knee with patient describing tightness and pulling throughout his hamstrings.  He is not experiencing pain in his groin or testicular area.  He denies back pain or radiating pain.  He denies blurred vision or diplopia.  He denies headache.  He was treated with Norco and oxycodone with slight improvement in pain.  Right lower extremity x-ray showed no evidence of acute bony abnormality.  MRI is pending.  PAST MEDICAL HISTORY:   Past Medical History:  Diagnosis Date  . Acute renal failure ([place])   . BPH (benign prostatic hyperplasia)   . Chronic atrial fibrillation   . Chronic diastolic CHF (congestive heart failure) ([name])    a. echo as above  . Chronic insomnia   . Coronary artery disease, non-occlusive  10/06/2013   a. cath 09/2013: pLAD 40, CTO Diag, ramus 40-->80, OM 50 mRCA 40, med rx  . Depression   . Diabetes mellitus with complication ([place])   . History of diverticulitis   . Hyperkalemia   . Hyperlipidemia   . Hypertension   . Malignant melanoma ([place])   . Mitral regurgitation    a. echo 09/2013: EF 55-60%, mildly dilated left atrrium, mild to moderate MR/TR  . Sleep apnea in adult     PAST SURGICAL HISTORY:   Past Surgical History:  Procedure Laterality Date  . APPENDECTOMY    . CARDIAC CATHETERIZATION  10/06/2013  . COLONOSCOPY    . COLONOSCOPY WITH PROPOFOL N/A 11/23/2014   Procedure: COLONOSCOPY WITH PROPOFOL;  Surgeon: Manya Silvas, MD;  [facility];  Service: Endoscopy;  Laterality: N/A;  . MOHS SURGERY    . removal tubular adenoma      SOCIAL HISTORY:   Social History   Tobacco Use  . Smoking status: Former Research scientist (life sciences)  . Smokeless tobacco: Never Used  Substance Use Topics  . Alcohol use: Yes    FAMILY HISTORY:   Family History  Family history unknown: Yes    DRUG ALLERGIES:   Allergies  Allergen Reactions  . Aspirin Other (See Comments)    Contraindicated - Kidney disease  . Ciprofloxacin Other (See Comments)    Other reaction(s): Other (See Comments) Pt states he could hardly move Pt  states he could hardly move Pt states he could hardly move Other reaction(s): Other (See Comments) Pt states he could hardly move   . Sitagliptin Other (See Comments)    Other reaction(s): Other (See Comments) Weakness Weakness Weakness Other reaction(s): Other (See Comments) Weakness     REVIEW OF SYSTEMS:   Review of Systems  Constitutional: Negative for chills, fever and malaise/fatigue.  HENT: Negative for congestion and sinus pain.   Eyes: Negative for blurred vision and double vision.  Respiratory: Negative for cough and shortness of breath.   Cardiovascular: Negative for chest pain and palpitations.  Gastrointestinal: Negative for  abdominal pain, constipation, diarrhea, heartburn, nausea and vomiting.  Genitourinary: Negative for dysuria, flank pain and hematuria.  Musculoskeletal: Positive for myalgias (right lower extremity pain).  Neurological: Negative for dizziness, focal weakness and headaches.  Psychiatric/Behavioral: Negative for depression.   MEDICATIONS AT HOME:   Prior to Admission medications   Medication Sig Start Date End Date Taking? Authorizing Provider  acetaminophen (TYLENOL) 500 MG tablet Take 1,000 mg by mouth every 6 (six) hours as needed.   Yes [provider]  apixaban (ELIQUIS) 5 MG TABS tablet Take 1 tablet (5 mg total) by mouth 2 (two) times daily. 08/25/17  Yes Gollan, Kathlene November, MD  atorvastatin (LIPITOR) 20 MG tablet Take 20 mg by mouth daily.   Yes [provider]  carvedilol (COREG) 25 MG tablet Take 1 tablet (25 mg total) by mouth 2 (two) times daily with a meal. 05/19/16  Yes Dustin Flock, MD  cloNIDine (CATAPRES) 0.1 MG tablet TAKE 2 TABLETS BY MOUTH TWICE DAILY 12/11/17  Yes Gollan, Kathlene November, MD  diltiazem (CARDIZEM CD) 120 MG 24 hr capsule Take 1 capsule by mouth once daily 09/06/18  Yes Gollan, Kathlene November, MD  famotidine (PEPCID) 20 MG tablet Take 1 tablet (20 mg total) by mouth at bedtime. 05/19/16  Yes Dustin Flock, MD  furosemide (LASIX) 20 MG tablet Take 20 mg by mouth daily.   Yes [provider]  glimepiride (AMARYL) 4 MG tablet Take 4 mg by mouth 2 (two) times daily.    Yes [provider]  isosorbide mononitrate (IMDUR) 30 MG 24 hr tablet TAKE 1 TABLET BY MOUTH ONCE DAILY 03/17/18  Yes Gollan, Kathlene November, MD  linagliptin (TRADJENTA) 5 MG TABS tablet Take 5 mg by mouth daily.   Yes [provider]  losartan (COZAAR) 50 MG tablet Take 50 mg by mouth daily. 06/14/18  Yes [provider]  magnesium oxide (MAG-OX) 400 (241.3 Mg) MG tablet Take 1 tablet by mouth daily. 09/05/18  Yes [provider]  Potassium Chloride ER  20 MEQ TBCR Take 1 tablet by mouth once daily 09/06/18  Yes Gollan, Kathlene November, MD  tamsulosin (FLOMAX) 0.4 MG CAPS capsule Take 0.4 mg by mouth daily.  12/10/13  Yes [provider]  traZODone (DESYREL) 50 MG tablet Take 50 mg by mouth at bedtime as needed for sleep.   Yes [provider]  nitroGLYCERIN (NITROSTAT) 0.4 MG SL tablet Place 1 tablet (0.4 mg total) every 5 (five) minutes as needed under the tongue for chest pain. 01/01/17   Minna Merritts, MD      VITAL SIGNS:  Blood pressure (!) 181/84, pulse 80, temperature 99.2 F (37.3 C), temperature source Oral, resp. rate 18, height 5' 8.5" (1.74 m), weight 92.1 kg, SpO2 98 %.  PHYSICAL EXAMINATION:  Physical Exam  GENERAL:  78 y.o.-year-old patient lying in the bed with no  acute distress.  EYES: Pupils equal, round, reactive to light and accommodation. No scleral icterus. Extraocular muscles intact.  HEENT: Head atraumatic, normocephalic. Oropharynx and nasopharynx clear.  NECK:  Supple, no jugular venous distention. No thyroid enlargement, no tenderness.  LUNGS: Normal breath sounds bilaterally, no wheezing, rales,rhonchi or crepitation. No use of accessory muscles of respiration.  CARDIOVASCULAR: Regular rate and rhythm, S1, S2 normal. No murmurs, rubs, or gallops.  ABDOMEN: Soft, nondistended, nontender. Bowel sounds present. No organomegaly or mass.  EXTREMITIES: Tenderness and pain to posterior right lower extremity- hamstring NEUROLOGIC: Cranial nerves II through XII are intact. Muscle strength 5/5 in all extremities. Sensation intact. Gait not checked.  PSYCHIATRIC: The patient is alert and oriented x 3.  Normal affect and good eye contact. SKIN: No obvious rash, lesion, or ulcer.   LABORATORY PANEL:   CBC Recent Labs  Lab 09/25/18 1959  WBC 11.3*  HGB 13.9  HCT 42.1  PLT 141*   ------------------------------------------------------------------------------------------------------------------   Chemistries  Recent Labs  Lab 09/25/18 1959  NA 138  K 4.0  CL 103  CO2 27  GLUCOSE 162*  BUN 20  CREATININE 1.36*  CALCIUM 9.0   ------------------------------------------------------------------------------------------------------------------  Cardiac Enzymes No results for input(s): TROPONINI in the last 168 hours. ------------------------------------------------------------------------------------------------------------------  RADIOLOGY:  Dg Femur Min 2 Views Right  Result Date: 09/25/2018 CLINICAL DATA:  Posterior thigh pain EXAM: RIGHT FEMUR 2 VIEWS COMPARISON:  None. FINDINGS: There is no evidence of fracture or other focal bone lesions. Soft tissues are unremarkable. Atherosclerotic vascular calcifications noted. IMPRESSION: No fracture of the RIGHT femur.  No aggressive osseous lesion. Atherosclerotic vascular calcifications noted. Electronically Signed   By: Suzy Bouchard M.D.   On: 09/25/2018 18:00      IMPRESSION AND PLAN:   1.  Right lower extremity injury - We will get MRI and review results - May consult orthopedic services - Physical therapy consultation for supportive care  2.  Right lower extremity pain - We will treat with analgesic  3.  Chronic atrial fibrillation on Eliquis - We will monitor closely for evidence of hematoma -Patient is on telemetry monitoring  4.  Inability to ambulate - We will consult orthopedic versus  DVT and PPI prophylaxis    All the records are reviewed and case discussed with ED provider. The plan of care was discussed in details with the patient (and family). I answered all questions. The patient agreed to proceed with the above mentioned plan. Further management will depend upon hospital course.   CODE STATUS: Full code  TOTAL TIME TAKING CARE OF THIS PATIENT:45 minutes.    [place] on 09/26/2018 at 6:30 AM  Pager - [phone]  After 6pm go to [email or web address] - Proofreader  [employer]  [facility]  Office  [phone]  CC: Primary care physician; [name], MD   Note: This dictation was prepared with Dragon dictation along with smaller phrase technology. Any transcriptional errors that result from this process are unintentional.

## 2018-09-26 NOTE — ED Notes (Addendum)
ED TO INPATIENT HANDOFF REPORT  ED Nurse Name and Phone #:  Gershon Mussel RN   [phone]  S Name/Age/Gender Todd Valentine Touch 78 y.o. male Room/Bed: ED42A/ED42A  Code Status   Code Status: Full Code  Home/SNF/Other Home Patient oriented to: self, place, time and situation Is this baseline? Yes   Triage Complete: Triage complete  Chief Complaint Right leg Pain  Triage Note Pt arrived via POV with reports of leg pain that started after he threw a sign at some boys that were on his property today 30 mins PTA.  Pt c/o pain to posterior upper leg on the right. Painful to walk and straighten leg out.   Allergies Allergies  Allergen Reactions  . Aspirin Other (See Comments)    Contraindicated - Kidney disease  . Ciprofloxacin Other (See Comments)    Other reaction(s): Other (See Comments) Pt states he could hardly move Pt states he could hardly move Pt states he could hardly move Other reaction(s): Other (See Comments) Pt states he could hardly move   . Sitagliptin Other (See Comments)    Other reaction(s): Other (See Comments) Weakness Weakness Weakness Other reaction(s): Other (See Comments) Weakness     Level of Care/Admitting Diagnosis ED Disposition    ED Disposition Condition [facility] Area: [place] [[id number]]  Level of Care: Med-Surg [16]  Covid Evaluation: Asymptomatic Screening Protocol (No Symptoms)  Diagnosis: Pain of right lower extremity due to injury [[id number]]  Admitting Physician: Mayer Camel [[id number]]  Attending Physician: Mayer Camel [[id number]]  Estimated length of stay: past midnight tomorrow  Certification:: I certify this patient will need inpatient services for at least 2 midnights  PT Class (Do Not Modify): Inpatient [101]  PT Acc Code (Do Not Modify): Private [1]       B Medical/Surgery History Past Medical History:  Diagnosis Date  . Acute renal failure ([place])   . BPH (benign prostatic hyperplasia)    . Chronic atrial fibrillation   . Chronic diastolic CHF (congestive heart failure) ([name])    a. echo as above  . Chronic insomnia   . Coronary artery disease, non-occlusive 10/06/2013   a. cath 09/2013: pLAD 40, CTO Diag, ramus 40-->80, OM 50 mRCA 40, med rx  . Depression   . Diabetes mellitus with complication ([place])   . History of diverticulitis   . Hyperkalemia   . Hyperlipidemia   . Hypertension   . Malignant melanoma ([place])   . Mitral regurgitation    a. echo 09/2013: EF 55-60%, mildly dilated left atrrium, mild to moderate MR/TR  . Sleep apnea in adult    Past Surgical History:  Procedure Laterality Date  . APPENDECTOMY    . CARDIAC CATHETERIZATION  10/06/2013  . COLONOSCOPY    . COLONOSCOPY WITH PROPOFOL N/A 11/23/2014   Procedure: COLONOSCOPY WITH PROPOFOL;  Surgeon: Manya Silvas, MD;  Location: [facility] ENDOSCOPY;  Service: Endoscopy;  Laterality: N/A;  . MOHS SURGERY    . removal tubular adenoma       A IV Location/Drains/Wounds Patient Lines/Drains/Airways Status   Active Line/Drains/Airways    Name:   Placement date:   Placement time:   Site:   Days:   Peripheral IV 09/25/18 Left Antecubital   09/25/18    2002    Antecubital   1          Intake/Output Last 24 hours No intake or output data in the 24 hours ending 09/26/18 0035  Labs/Imaging Results  for orders placed or performed during the hospital encounter of 09/25/18 (from the past 48 hour(s))  CBC     Status: Abnormal   Collection Time: 09/25/18  7:59 PM  Result Value Ref Range   WBC 11.3 (H) 4.0 - 10.5 K/uL   RBC 4.69 4.22 - 5.81 MIL/uL   Hemoglobin 13.9 13.0 - 17.0 g/dL   HCT 42.1 39.0 - 52.0 %   MCV 89.8 80.0 - 100.0 fL   MCH 29.6 26.0 - 34.0 pg   MCHC 33.0 30.0 - 36.0 g/dL   RDW 12.8 11.5 - 15.5 %   Platelets 141 (L) 150 - 400 K/uL    Comment: SPECIMEN CHECKED FOR CLOTS   nRBC 0.0 0.0 - 0.2 %    Comment: Performed at [facility], [address] [place], [place] [address]  Basic  metabolic panel     Status: Abnormal   Collection Time: 09/25/18  7:59 PM  Result Value Ref Range   Sodium 138 135 - 145 mmol/L   Potassium 4.0 3.5 - 5.1 mmol/L   Chloride 103 98 - 111 mmol/L   CO2 27 22 - 32 mmol/L   Glucose, Bld 162 (H) 70 - 99 mg/dL   BUN 20 8 - 23 mg/dL   Creatinine, Ser 1.36 (H) 0.61 - 1.24 mg/dL   Calcium 9.0 8.9 - 10.3 mg/dL   GFR calc non Af Amer 50 (L) >60 mL/min   GFR calc Af Amer 58 (L) >60 mL/min   Anion gap 8 5 - 15    Comment: Performed at [facility], [address] [place], [place] [address]   Dg Femur Min 2 Views Right  Result Date: 09/25/2018 CLINICAL DATA:  Posterior thigh pain EXAM: RIGHT FEMUR 2 VIEWS COMPARISON:  None. FINDINGS: There is no evidence of fracture or other focal bone lesions. Soft tissues are unremarkable. Atherosclerotic vascular calcifications noted. IMPRESSION: No fracture of the RIGHT femur.  No aggressive osseous lesion. Atherosclerotic vascular calcifications noted. Electronically Signed   By: Suzy Bouchard M.D.   On: 09/25/2018 18:00    Pending Labs Unresulted Labs (From admission, onward)    Start     Ordered   09/25/18 1914  SARS CORONAVIRUS 2 Nasal Swab Aptima Multi Swab  (Asymptomatic Patients Labs)  Once,   STAT    Question Answer Comment  Is this test for diagnosis or screening Screening   Symptomatic for COVID-19 as defined by CDC No   Hospitalized for COVID-19 No   Admitted to ICU for COVID-19 No   Previously tested for COVID-19 No   Resident in a congregate (group) care setting No   Employed in healthcare setting No      09/25/18 1913          Vitals/Pain Today's Vitals   09/25/18 1705 09/25/18 1706 09/26/18 0032  BP: (!) 184/102  (!) 164/76  Pulse: (!) 109  93  Resp: 20  18  Temp: 98.1 F (36.7 C)  98.5 F (36.9 C)  TempSrc: Oral  Oral  SpO2: 99%  97%  Weight:  92.1 kg   Height:  5' 8.5" (1.74 m)   PainSc:  10-Worst pain ever 8     Isolation Precautions No active  isolations  Medications Medications  apixaban (ELIQUIS) tablet 5 mg (has no administration in time range)  carvedilol (COREG) tablet 25 mg (has no administration in time range)  cloNIDine (CATAPRES) tablet 0.2 mg (has no administration in time range)  diltiazem (CARDIZEM CD)  24 hr capsule 120 mg (has no administration in time range)  famotidine (PEPCID) tablet 20 mg (has no administration in time range)  isosorbide mononitrate (IMDUR) 24 hr tablet 30 mg (has no administration in time range)  Potassium Chloride ER TBCR 1 tablet (has no administration in time range)  atorvastatin (LIPITOR) tablet 20 mg (has no administration in time range)  furosemide (LASIX) tablet 20 mg (has no administration in time range)  glimepiride (AMARYL) tablet 4 mg (has no administration in time range)  linagliptin (TRADJENTA) tablet 5 mg (has no administration in time range)  losartan (COZAAR) tablet 50 mg (has no administration in time range)  magnesium oxide (MAG-OX) tablet 400 mg (has no administration in time range)  tamsulosin (FLOMAX) capsule 0.4 mg (has no administration in time range)  sodium chloride flush (NS) 0.9 % injection 3 mL (has no administration in time range)  HYDROcodone-acetaminophen (NORCO/VICODIN) 5-325 MG per tablet 1-2 tablet (has no administration in time range)  acetaminophen (TYLENOL) tablet 650 mg (has no administration in time range)    Or  acetaminophen (TYLENOL) suppository 650 mg (has no administration in time range)  ondansetron (ZOFRAN) tablet 4 mg (has no administration in time range)    Or  ondansetron (ZOFRAN) injection 4 mg (has no administration in time range)  polyethylene glycol (MIRALAX / GLYCOLAX) packet 17 g (has no administration in time range)  HYDROcodone-acetaminophen (NORCO/VICODIN) 5-325 MG per tablet 1 tablet (1 tablet Oral Given 09/25/18 1755)  oxyCODONE-acetaminophen (PERCOCET/ROXICET) 5-325 MG per tablet 1 tablet (1 tablet Oral Given 09/25/18 1950)     Mobility non-ambulatory Low fall risk   Focused Assessments Cardiac Assessment Handoff:    Lab Results  Component Value Date   TROPONINI 0.42 ([place]) 05/11/2016   No results found for: DDIMER Does the Patient currently have chest pain? No     R Recommendations: See Admitting Provider Note  Report given to: Tonya RN on 1C  Additional Notes:  Non ambulatory due to hamstring injury

## 2018-09-26 NOTE — Consult Note (Signed)
ORTHOPAEDIC CONSULTATION  PATIENT NAME: Todd Valentine DOB: [birth year]  MRN: [id number]  REQUESTING PHYSICIAN: [name], MD  Chief Complaint: R posterior thigh pain  HPI: Todd Valentine is a 78 y.o. male who complains of  Right posterior thigh pain after he felt a pop while picking up a sign to throw at trespassers on his property. He had immediate pain in the back of his leg. He was able to bear weight, but significantly hurts. He was admitted for imaging and for ambulation. No complaints of numbnes/tingling.   Past Medical History:  Diagnosis Date  . Acute renal failure ([place])   . BPH (benign prostatic hyperplasia)   . Chronic atrial fibrillation   . Chronic diastolic CHF (congestive heart failure) ([name])    a. echo as above  . Chronic insomnia   . Coronary artery disease, non-occlusive 10/06/2013   a. cath 09/2013: pLAD 40, CTO Diag, ramus 40-->80, OM 50 mRCA 40, med rx  . Depression   . Diabetes mellitus with complication ([place])   . History of diverticulitis   . Hyperkalemia   . Hyperlipidemia   . Hypertension   . Malignant melanoma ([place])   . Mitral regurgitation    a. echo 09/2013: EF 55-60%, mildly dilated left atrrium, mild to moderate MR/TR  . Sleep apnea in adult    Past Surgical History:  Procedure Laterality Date  . APPENDECTOMY    . CARDIAC CATHETERIZATION  10/06/2013  . COLONOSCOPY    . COLONOSCOPY WITH PROPOFOL N/A 11/23/2014   Procedure: COLONOSCOPY WITH PROPOFOL;  Surgeon: Manya Silvas, MD;  Location: [facility] ENDOSCOPY;  Service: Endoscopy;  Laterality: N/A;  . MOHS SURGERY    . removal tubular adenoma     Social History   Socioeconomic History  . Marital status: Widowed    Spouse name: Not on file  . Number of children: Not on file  . Years of education: Not on file  . Highest education level: Not on file  Occupational History  . Not on file  Social Needs  . Financial resource strain: Not on file  . Food insecurity    Worry: Not on file     Inability: Not on file  . Transportation needs    Medical: Not on file    Non-medical: Not on file  Tobacco Use  . Smoking status: Former Research scientist (life sciences)  . Smokeless tobacco: Never Used  Substance and Sexual Activity  . Alcohol use: Yes  . Drug use: No  . Sexual activity: Not on file  Lifestyle  . Physical activity    Days per week: Not on file    Minutes per session: Not on file  . Stress: Not on file  Relationships  . Social Herbalist on phone: Not on file    Gets together: Not on file    Attends religious service: Not on file    Active member of club or organization: Not on file    Attends meetings of clubs or organizations: Not on file    Relationship status: Not on file  Other Topics Concern  . Not on file  Social History Narrative  . Not on file   Family History  Family history unknown: Yes   Allergies  Allergen Reactions  . Aspirin Other (See Comments)    Contraindicated - Kidney disease  . Ciprofloxacin Other (See Comments)    Other reaction(s): Other (See Comments) Pt states he could hardly move Pt states he could hardly move Pt  states he could hardly move Other reaction(s): Other (See Comments) Pt states he could hardly move   . Sitagliptin Other (See Comments)    Other reaction(s): Other (See Comments) Weakness Weakness Weakness Other reaction(s): Other (See Comments) Weakness    Prior to Admission medications   Medication Sig Start Date End Date Taking? Authorizing Provider  acetaminophen (TYLENOL) 500 MG tablet Take 1,000 mg by mouth every 6 (six) hours as needed.   Yes [provider]  apixaban (ELIQUIS) 5 MG TABS tablet Take 1 tablet (5 mg total) by mouth 2 (two) times daily. 08/25/17  Yes Gollan, Kathlene November, MD  atorvastatin (LIPITOR) 20 MG tablet Take 20 mg by mouth daily.   Yes [provider]  carvedilol (COREG) 25 MG tablet Take 1 tablet (25 mg total) by mouth 2 (two) times daily with a meal. 05/19/16  Yes Dustin Flock,  MD  cloNIDine (CATAPRES) 0.1 MG tablet TAKE 2 TABLETS BY MOUTH TWICE DAILY 12/11/17  Yes Gollan, Kathlene November, MD  diltiazem (CARDIZEM CD) 120 MG 24 hr capsule Take 1 capsule by mouth once daily 09/06/18  Yes Gollan, Kathlene November, MD  famotidine (PEPCID) 20 MG tablet Take 1 tablet (20 mg total) by mouth at bedtime. 05/19/16  Yes Dustin Flock, MD  furosemide (LASIX) 20 MG tablet Take 20 mg by mouth daily.   Yes [provider]  glimepiride (AMARYL) 4 MG tablet Take 4 mg by mouth 2 (two) times daily.    Yes [provider]  isosorbide mononitrate (IMDUR) 30 MG 24 hr tablet TAKE 1 TABLET BY MOUTH ONCE DAILY 03/17/18  Yes Gollan, Kathlene November, MD  linagliptin (TRADJENTA) 5 MG TABS tablet Take 5 mg by mouth daily.   Yes [provider]  losartan (COZAAR) 50 MG tablet Take 50 mg by mouth daily. 06/14/18  Yes [provider]  magnesium oxide (MAG-OX) 400 (241.3 Mg) MG tablet Take 1 tablet by mouth daily. 09/05/18  Yes [provider]  Potassium Chloride ER 20 MEQ TBCR Take 1 tablet by mouth once daily 09/06/18  Yes Gollan, Kathlene November, MD  tamsulosin (FLOMAX) 0.4 MG CAPS capsule Take 0.4 mg by mouth daily.  12/10/13  Yes [provider]  traZODone (DESYREL) 50 MG tablet Take 50 mg by mouth at bedtime as needed for sleep.   Yes [provider]  nitroGLYCERIN (NITROSTAT) 0.4 MG SL tablet Place 1 tablet (0.4 mg total) every 5 (five) minutes as needed under the tongue for chest pain. 01/01/17   Minna Merritts, MD   Mr Frmur Right Wo Contrast  Result Date: 09/26/2018 CLINICAL DATA:  Acute onset of right posterior thigh pain. Twisted leg and felt a popping sensation in his thigh. EXAM: MRI OF THE RIGHT FEMUR WITHOUT CONTRAST TECHNIQUE: Multiplanar, multisequence MR imaging of the right femur was performed. No intravenous contrast was administered. COMPARISON:  Radiographs, same date. FINDINGS: The hamstring tendons are completely torn from their attachment site  on the ischium. Both the conjoined tendon of the biceps femoris and semitendinosis tendons and the semimembranosus tendon are completely torn and retracted at least 4 cm. There is extensive edema and hemorrhage extending back into the hamstring muscles. There also fairly extensive muscle tears involving all of the adductor muscles with significant edema, hemorrhage and fluid. The quadratus femoris muscle is also torn and the inferior aspect of the gluteus maximus muscle is torn. Moderate fatty atrophy noted of the rectus femoris muscle may be related to previous trauma or denervation. Similar  findings involving the gluteus medius muscle. Both hips are normally located. No fractures or AVN. The pubic symphysis and SI joints are intact. No femur fractures. IMPRESSION: 1. Completely ruptured and retracted hamstring tendons from their attachment site on the ischium. There is extensive associated hemorrhage, fluid and edema extending all the way down the hamstring muscles in the posterior compartment of the thigh. 2. Extensively torn adductor muscles, quadratus femoris muscle and gluteus maximus muscle. 3. No acute bony findings. Electronically Signed   By: Marijo Sanes M.D.   On: 09/26/2018 11:42   Dg Femur Min 2 Views Right  Result Date: 09/25/2018 CLINICAL DATA:  Posterior thigh pain EXAM: RIGHT FEMUR 2 VIEWS COMPARISON:  None. FINDINGS: There is no evidence of fracture or other focal bone lesions. Soft tissues are unremarkable. Atherosclerotic vascular calcifications noted. IMPRESSION: No fracture of the RIGHT femur.  No aggressive osseous lesion. Atherosclerotic vascular calcifications noted. Electronically Signed   By: Suzy Bouchard M.D.   On: 09/25/2018 18:00    Positive ROS: All other systems have been reviewed and were otherwise negative with the exception of those mentioned in the HPI and as above.  Physical Exam: General: Well developed, well nourished male seen in no acute distress. HEENT:  Atraumatic and normocephalic. Sclera are clear. Extraocular motion is intact. Oropharynx is clear with moist mucosa. Neck: Supple, nontender, good range of motion. No JVD or carotid bruits. Lungs: Clear to auscultation bilaterally. Cardiovascular: Regular rate and rhythm with normal S1 and S2. No murmurs. No gallops or rubs. Pedal pulses are palpable bilaterally. Homans test is negative bilaterally. No significant pretibial or ankle edema. Abdomen: Soft, nontender, and nondistended. Bowel sounds are present. Skin: No lesions in the area of chief complaint Neurologic: Awake, alert, and oriented. Sensory function is grossly intact. Motor strength is felt to be 5 over 5 bilaterally. No clonus or tremor. Good motor coordination. Lymphatic: No axillary or cervical lymphadenopathy  MUSCULOSKELETAL:   RLE: Skin intact, no deformity. Able to do SLR. Neg logroll. TTP at the ischium and roughly 5 cm distal to this as well. No tenderness along femur, knee, tibia, or ankle. Full ROM of hip/knee/ankle. SILT SPN/DPN/T nerves. Fires TA/EHL/GSC  Assessment: 78 y/o M w/ three tendon proximal hamstring tear with significant retraction  Plan: Pt and his daughter were counseled on diagnosis and potential treatment options. Pt counseled that it would be reasonable to proceed with hamstring repair with the extent of retraction of all three tendons, but it is not an emergent procedure.  -Plan for WBAT, ROMAT -Plan for PT for mobilization -F/u with [facility] clinic as outpatient for further surgical discussion.   Ted Mcalpine Latika Kronick . M.D.

## 2018-09-26 NOTE — Progress Notes (Signed)
Family Meeting Note  Advance Directive:yes  Today a meeting took place with the Patient.    The following clinical team members were present during this meeting:MD  The following were discussed:Patient's diagnosis: Severe right lower extremity pain with hamstring tear, chronic atrial fibrillation elevated blood pressure is admitted to the hospital.  Treatment plan of care discussed in detail with the patient.  He verbalized understanding of the plan.  Plan is updated to patient's daughter   patient's progosis: Unable to determine and Goals for treatment: Full Code  Todd Valentine daughter [name]  Additional follow-up to be provided: Hospitalist and orthopedics  Time spent during discussion:17 MIN   [name], MD

## 2018-09-26 NOTE — Progress Notes (Signed)
Returned call to daughter Butch Penny for update

## 2018-09-26 NOTE — Plan of Care (Signed)

## 2018-09-26 NOTE — Progress Notes (Signed)
   09/26/18 1155  Clinical Encounter Type  Visited With Patient  Visit Type Initial  Referral From Nurse  Consult/Referral To Chaplain  Spiritual Encounters  Spiritual Needs Prayer;Emotional  [place] entered room to see patient about to eat lunch. Patient was talkative and religious. [place] completed initial spiritual care screening. Patient became tearful and requesting prayer. [facility] provided pastoral care through prayer, emotional and spiritual support and normalizing feelings. Pastoral visit was appreciated. No further actions needed at this time.

## 2018-09-26 NOTE — Progress Notes (Signed)
Patient in no obvious distress. Has been medicated x 2 per order for pain control of right posterior thigh.  To CT and tolerated transport well.  Family updated this AM.

## 2018-09-27 LAB — CBC
HCT: 34 % — ABNORMAL LOW (ref 39.0–52.0)
Hemoglobin: 11.3 g/dL — ABNORMAL LOW (ref 13.0–17.0)
MCH: 30.2 pg (ref 26.0–34.0)
MCHC: 33.2 g/dL (ref 30.0–36.0)
MCV: 90.9 fL (ref 80.0–100.0)
Platelets: 142 10*3/uL — ABNORMAL LOW (ref 150–400)
RBC: 3.74 MIL/uL — ABNORMAL LOW (ref 4.22–5.81)
RDW: 12.6 % (ref 11.5–15.5)
WBC: 10.6 10*3/uL — ABNORMAL HIGH (ref 4.0–10.5)
nRBC: 0 % (ref 0.0–0.2)

## 2018-09-27 LAB — GLUCOSE, CAPILLARY
Glucose-Capillary: 162 mg/dL — ABNORMAL HIGH (ref 70–99)
Glucose-Capillary: 135 mg/dL — ABNORMAL HIGH (ref 70–99)

## 2018-09-27 MED ORDER — INSULIN ASPART 100 UNIT/ML ~~LOC~~ SOLN
0.0000 [IU] | Freq: Three times a day (TID) | SUBCUTANEOUS | Status: DC
  Administered 2018-09-27: 2 [IU] via SUBCUTANEOUS
  Administered 2018-09-28: 08:00:00 1 [IU] via SUBCUTANEOUS
  Administered 2018-09-28 – 2018-09-29 (×4): 2 [IU] via SUBCUTANEOUS
  Administered 2018-09-30: 12:00:00 3 [IU] via SUBCUTANEOUS
  Administered 2018-09-30 (×2): 2 [IU] via SUBCUTANEOUS
  Administered 2018-10-01: 13:00:00 3 [IU] via SUBCUTANEOUS
  Administered 2018-10-01 (×2): 2 [IU] via SUBCUTANEOUS
  Administered 2018-10-02: 3 [IU] via SUBCUTANEOUS
  Administered 2018-10-02: 2 [IU] via SUBCUTANEOUS
  Filled 2018-09-27 (×13): qty 1

## 2018-09-27 MED ORDER — INSULIN ASPART 100 UNIT/ML ~~LOC~~ SOLN: 0.0000 [IU] | Freq: Every day | SUBCUTANEOUS | Status: DC

## 2018-09-27 MED ORDER — CYCLOBENZAPRINE HCL 10 MG PO TABS
10.0000 mg | ORAL_TABLET | Freq: Three times a day (TID) | ORAL | Status: DC | PRN
  Administered 2018-09-30 (×2): 10 mg via ORAL
  Filled 2018-09-27 (×2): qty 1

## 2018-09-27 MED ORDER — MORPHINE SULFATE (PF) 2 MG/ML IV SOLN
2.0000 mg | INTRAVENOUS | Status: DC | PRN
  Administered 2018-09-27: 2 mg via INTRAVENOUS
  Filled 2018-09-27 (×2): qty 1

## 2018-09-27 NOTE — Progress Notes (Signed)
Patient states he only has pain when he moves his leg. Would like to go home but wants to be able to get up. Patient would also like to change dressing on his nose.

## 2018-09-27 NOTE — TOC Progression Note (Signed)
Transition of Care [facility]) - Progression Note    Patient Details  Name: Todd Valentine MRN: [id number] Date of Birth: 07/06/1940  Transition of [facility]) CM/SW Contact  Su Hilt, RN Phone Number: 09/27/2018, 3:35 PM  Clinical Narrative:    Spoke to the daughter Butch Penny with the patient's permission They are wanting to see another ortho physician since the patient is in severe pain.  They would like to DC to [place] if possible at DC I notified the [hysician that they would like to see another ortho physician     Expected Discharge Plan and Services                                                 Social Determinants of Health (SDOH) Interventions    Readmission Risk Interventions No flowsheet data found.

## 2018-09-27 NOTE — Progress Notes (Addendum)
[place] [facility] [place] NAME: Todd Valentine    MR#:  [id number]  DATE OF BIRTH:  January 23, 1941  SUBJECTIVE:  Patient continues having a lot of pain today. The pain is very severe whenever he tries to move his leg. He has not been up with PT.  REVIEW OF SYSTEMS:  CONSTITUTIONAL: No fever, fatigue or weakness.  EYES: No blurred or double vision.  EARS, NOSE, AND THROAT: No tinnitus or ear pain.  RESPIRATORY: No cough, shortness of breath, wheezing or hemoptysis.  CARDIOVASCULAR: No chest pain, orthopnea, edema.  GASTROINTESTINAL: No nausea, vomiting, diarrhea or abdominal pain.  GENITOURINARY: No dysuria, hematuria.  ENDOCRINE: No polyuria, nocturia,  HEMATOLOGY: No anemia, easy bruising or bleeding SKIN: No rash or lesion. MUSCULOSKELETAL: No joint pain or arthritis.  +right leg pain NEUROLOGIC: No tingling, numbness, weakness.  PSYCHIATRY: No anxiety or depression.   DRUG ALLERGIES:   Allergies  Allergen Reactions  . Aspirin Other (See Comments)    Contraindicated - Kidney disease  . Ciprofloxacin Other (See Comments)    Other reaction(s): Other (See Comments) Pt states he could hardly move Pt states he could hardly move Pt states he could hardly move Other reaction(s): Other (See Comments) Pt states he could hardly move   . Sitagliptin Other (See Comments)    Other reaction(s): Other (See Comments) Weakness Weakness Weakness Other reaction(s): Other (See Comments) Weakness     VITALS:  Blood pressure 140/68, pulse 88, temperature 98.2 F (36.8 C), temperature source Oral, resp. rate 17, height 5' 8.5" (1.74 m), weight 92.1 kg, SpO2 96 %.  PHYSICAL EXAMINATION:  GENERAL:  78 y.o.-year-old patient lying in the bed with no acute distress.  EYES: Pupils equal, round, reactive to light and accommodation. No scleral icterus. Extraocular muscles intact.  HEENT: Head atraumatic, normocephalic. Oropharynx and nasopharynx  clear.  NECK:  Supple, no jugular venous distention. No thyroid enlargement, no tenderness.  LUNGS: Normal breath sounds bilaterally, no wheezing, rales,rhonchi or crepitation. No use of accessory muscles of respiration.  CARDIOVASCULAR: RRR, S1, S2 normal. No murmurs, rubs, or gallops.  ABDOMEN: Soft, nontender, nondistended. Bowel sounds present. No organomegaly or mass.  EXTREMITIES: No pedal edema, cyanosis, or clubbing.  No obvious ecchymoses on the right posterior thigh, although exam is limited due to pain. NEUROLOGIC: Cranial nerves II through XII are intact.  Unable to assess muscle strength in the right lower extremity secondary to pain. Sensation intact. Gait not checked.  PSYCHIATRIC: The patient is alert and oriented x 3.  SKIN: No obvious rash, lesion, or ulcer.    LABORATORY PANEL:   CBC Recent Labs  Lab 09/27/18 0904  WBC 10.6*  HGB 11.3*  HCT 34.0*  PLT 142*   ------------------------------------------------------------------------------------------------------------------  Chemistries  Recent Labs  Lab 09/25/18 1959  NA 138  K 4.0  CL 103  CO2 27  GLUCOSE 162*  BUN 20  CREATININE 1.36*  CALCIUM 9.0   ------------------------------------------------------------------------------------------------------------------  Cardiac Enzymes No results for input(s): TROPONINI in the last 168 hours. ------------------------------------------------------------------------------------------------------------------  RADIOLOGY:  Mr Frmur Right Wo Contrast  Result Date: 09/26/2018 CLINICAL DATA:  Acute onset of right posterior thigh pain. Twisted leg and felt a popping sensation in his thigh. EXAM: MRI OF THE RIGHT FEMUR WITHOUT CONTRAST TECHNIQUE: Multiplanar, multisequence MR imaging of the right femur was performed. No intravenous contrast was administered. COMPARISON:  Radiographs, same date. FINDINGS: The hamstring tendons are completely torn from their attachment  site on the ischium. Both  the conjoined tendon of the biceps femoris and semitendinosis tendons and the semimembranosus tendon are completely torn and retracted at least 4 cm. There is extensive edema and hemorrhage extending back into the hamstring muscles. There also fairly extensive muscle tears involving all of the adductor muscles with significant edema, hemorrhage and fluid. The quadratus femoris muscle is also torn and the inferior aspect of the gluteus maximus muscle is torn. Moderate fatty atrophy noted of the rectus femoris muscle may be related to previous trauma or denervation. Similar findings involving the gluteus medius muscle. Both hips are normally located. No fractures or AVN. The pubic symphysis and SI joints are intact. No femur fractures. IMPRESSION: 1. Completely ruptured and retracted hamstring tendons from their attachment site on the ischium. There is extensive associated hemorrhage, fluid and edema extending all the way down the hamstring muscles in the posterior compartment of the thigh. 2. Extensively torn adductor muscles, quadratus femoris muscle and gluteus maximus muscle. 3. No acute bony findings. Electronically Signed   By: Marijo Sanes M.D.   On: 09/26/2018 11:42   Dg Femur Min 2 Views Right  Result Date: 09/25/2018 CLINICAL DATA:  Posterior thigh pain EXAM: RIGHT FEMUR 2 VIEWS COMPARISON:  None. FINDINGS: There is no evidence of fracture or other focal bone lesions. Soft tissues are unremarkable. Atherosclerotic vascular calcifications noted. IMPRESSION: No fracture of the RIGHT femur.  No aggressive osseous lesion. Atherosclerotic vascular calcifications noted. Electronically Signed   By: Suzy Bouchard M.D.   On: 09/25/2018 18:00    EKG:   Orders placed or performed in visit on 09/01/18  . EKG 12-Lead    ASSESSMENT AND PLAN:   Right lower extremity pain secondary to 3 tendon proximal hamstring tear with significant retraction. -Pain control -Ortho  following-recommend WBAT and ROMAT -PT recommending SNF  Acute blood loss anemia- likely due to hamstring tear, but no obvious ecchymoses on exam. Hemoglobin dropped from 13.9 ? 11.3, although his baseline hemoglobin appears to be 11.5-12. -Continue Eliquis for now -If hemoglobin drops again tomorrow, will need to hold Eliquis  Chronic atrial fibrillation- rate controlled -Continue Eliquis, Coreg, diltiazem -IV Lopressor PRN  Hypertension- BP controlled. -Continue Coreg, clonidine, diltiazem, losartan -IV Lopressor PRN  CKD III-IV-creatinine better than baseline -Avoid nephrotoxic agents -Monitor  DVT prophylaxis- Eliquis  All the records are reviewed and case discussed with Care Management/Social Workerr. Management plans discussed with the patient, family and they are in agreement.  CODE STATUS: Full  TOTAL TIME TAKING CARE OF THIS PATIENT: 40 minutes.   POSSIBLE D/C IN 1-2 DAYS, DEPENDING ON CLINICAL CONDITION.  Note: This dictation was prepared with Dragon dictation along with smaller phrase technology. Any transcriptional errors that result from this process are unintentional.   Evette Doffing M.D on 09/27/2018 at 3:18 PM  Between 7am to 6pm - Pager - [phone]  After 6pm go to [email or web address] - password [email or web address]  Office  [phone]  CC: Primary care physician; [name], MD

## 2018-09-27 NOTE — Plan of Care (Signed)
  Problem: Education: Goal: Knowledge of General Education information will improve Description: Including pain rating scale, medication(s)/side effects and non-pharmacologic comfort measures Outcome: Progressing   Problem: Health Behavior/Discharge Planning: Goal: Ability to manage health-related needs will improve Outcome: Progressing   Problem: Clinical Measurements: Goal: Ability to maintain clinical measurements within normal limits will improve Outcome: Progressing Goal: Will remain free from infection Outcome: Progressing Goal: Diagnostic test results will improve Outcome: Progressing Goal: Respiratory complications will improve Outcome: Progressing   Problem: Activity: Goal: Risk for activity intolerance will decrease Outcome: Progressing   Problem: Nutrition: Goal: Adequate nutrition will be maintained Outcome: Progressing   Problem: Elimination: Goal: Will not experience complications related to bowel motility Outcome: Progressing Goal: Will not experience complications related to urinary retention Outcome: Progressing   Problem: Pain Managment: Goal: General experience of comfort will improve Outcome: Progressing   Problem: Safety: Goal: Ability to remain free from injury will improve Outcome: Progressing   Problem: Skin Integrity: Goal: Risk for impaired skin integrity will decrease Outcome: Progressing

## 2018-09-27 NOTE — NC FL2 (Signed)
[place] LEVEL OF CARE SCREENING TOOL     IDENTIFICATION  Patient Name: Todd Valentine Birthdate: [birth year] Sex: male Admission Date (Current Location): 09/25/2018  [place] and [place] Number:  Engineering geologist and Address:  [facility], [address] [place], [place] [address]      Provider Number: [id number]  Attending Physician Name and Address:  Sela Hua, MD  Relative Name and Phone Number:  Heron Sabins [phone]    Current Level of Care: Hospital Recommended Level of Care: [place] Prior Approval Number:    Date Approved/Denied:   PASRR Number: [id number]  Discharge Plan: SNF    Current Diagnoses: Patient Active Problem List   Diagnosis Date Noted  . Pain of right lower extremity due to injury 09/26/2018  . Acute respiratory failure with hypoxia ([place])   . Community acquired pneumonia of left lung   . Essential hypertension   . Demand ischemia ([place])   . Sepsis ([place]) 05/08/2016  . Aspiration pneumonia ([place]) 05/08/2016  . Atrial fibrillation with RVR ([place]) 05/08/2016  . Hypokalemia 05/08/2016  . Gastroenteritis 05/08/2016  . AKI (acute kidney injury) ([place]) 05/08/2016  . Hypomagnesemia 05/08/2016  . Leukopenia 05/08/2016  . Chronic atrial fibrillation 01/02/2014  . Acute on chronic diastolic CHF (congestive heart failure) ([place]) 01/02/2014  . Morbid obesity ([place]) 01/02/2014  . CAD (coronary artery disease), native coronary artery 01/02/2014  . Bilateral leg edema 01/02/2014  . Diabetes mellitus type 2 with complications ([place]) 66/44/0347  . History of smoking 30 or more pack years 01/02/2014  . Shortness of breath 01/02/2014  . Hyperlipidemia 01/02/2014    Orientation RESPIRATION BLADDER Height & Weight     Self, Time, Situation, Place  Normal Continent Weight: 92.1 kg Height:  5' 8.5" (174 cm)  BEHAVIORAL SYMPTOMS/MOOD NEUROLOGICAL BOWEL NUTRITION STATUS      Continent Diet   AMBULATORY STATUS COMMUNICATION OF NEEDS Skin   Extensive Assist Verbally Normal, Skin abrasions                       Personal Care Assistance Level of Assistance  Bathing, Dressing Bathing Assistance: Limited assistance   Dressing Assistance: Limited assistance     Functional Limitations Info  Sight, Hearing, Speech Sight Info: Adequate Hearing Info: Adequate Speech Info: Adequate    SPECIAL CARE FACTORS FREQUENCY  PT (By licensed PT)     PT Frequency: 5 times per week              Contractures Contractures Info: Not present    Additional Factors Info                  Current Medications (09/27/2018):  This is the current hospital active medication list Current Facility-Administered Medications  Medication Dose Route Frequency Provider Last Rate Last Dose  . acetaminophen (TYLENOL) tablet 650 mg  650 mg Oral Q6H PRN Seals, Theo Dills, NP       Or  . acetaminophen (TYLENOL) suppository 650 mg  650 mg Rectal Q6H PRN Seals, Theo Dills, NP      . apixaban (ELIQUIS) tablet 5 mg  5 mg Oral BID Gardiner Barefoot H, NP   5 mg at 09/27/18 4259  . atorvastatin (LIPITOR) tablet 20 mg  20 mg Oral Daily Seals, Theo Dills, NP   20 mg at 09/26/18 1910  . carvedilol (COREG) tablet 25 mg  25 mg Oral BID WC Seals, Theo Dills, NP  25 mg at 09/27/18 0840  . cloNIDine (CATAPRES) tablet 0.2 mg  0.2 mg Oral BID Gardiner Barefoot H, NP   0.2 mg at 09/27/18 1062  . cyclobenzaprine (FLEXERIL) tablet 10 mg  10 mg Oral TID PRN Mayo, Pete Pelt, MD      . diltiazem (CARDIZEM CD) 24 hr capsule 120 mg  120 mg Oral Daily Seals, Angela H, NP   120 mg at 09/27/18 6948  . famotidine (PEPCID) tablet 20 mg  20 mg Oral QHS Seals, Angela H, NP   20 mg at 09/26/18 2115  . furosemide (LASIX) tablet 20 mg  20 mg Oral Daily Seals, Levada Dy H, NP   20 mg at 09/27/18 0926  . HYDROcodone-acetaminophen (NORCO/VICODIN) 5-325 MG per tablet 1-2 tablet  1-2 tablet Oral Q4H PRN Mayer Camel, NP   2 tablet at 09/27/18 1517   . insulin aspart (novoLOG) injection 0-5 Units  0-5 Units Subcutaneous QHS Mayo, Pete Pelt, MD      . insulin aspart (novoLOG) injection 0-9 Units  0-9 Units Subcutaneous TID WC Mayo, Pete Pelt, MD      . isosorbide mononitrate (IMDUR) 24 hr tablet 30 mg  30 mg Oral Daily Seals, Angela H, NP   30 mg at 09/27/18 5462  . linagliptin (TRADJENTA) tablet 5 mg  5 mg Oral Daily Seals, Theo Dills, NP   5 mg at 09/27/18 7035  . losartan (COZAAR) tablet 50 mg  50 mg Oral Daily Seals, Theo Dills, NP   50 mg at 09/27/18 0093  . magnesium oxide (MAG-OX) tablet 400 mg  400 mg Oral Daily Seals, Levada Dy H, NP   400 mg at 09/27/18 8182  . metoprolol tartrate (LOPRESSOR) injection 5 mg  5 mg Intravenous Q4H PRN Gouru, Aruna, MD      . morphine 2 MG/ML injection 2 mg  2 mg Intravenous Q3H PRN Mayo, Pete Pelt, MD   2 mg at 09/27/18 1309  . ondansetron (ZOFRAN) tablet 4 mg  4 mg Oral Q6H PRN Seals, Theo Dills, NP       Or  . ondansetron (ZOFRAN) injection 4 mg  4 mg Intravenous Q6H PRN Seals, Levada Dy H, NP      . polyethylene glycol (MIRALAX / GLYCOLAX) packet 17 g  17 g Oral Daily PRN Seals, Angela H, NP      . sodium chloride flush (NS) 0.9 % injection 3 mL  3 mL Intravenous Q12H Seals, Angela H, NP   3 mL at 09/27/18 0929  . tamsulosin (FLOMAX) capsule 0.4 mg  0.4 mg Oral Daily Seals, Theo Dills, NP   0.4 mg at 09/27/18 9937     Discharge Medications: Please see discharge summary for a list of discharge medications.  Relevant Imaging Results:  Relevant Lab Results:   Additional Information [id number]  Su Hilt, RN

## 2018-09-27 NOTE — TOC Progression Note (Addendum)
Transition of Care [facility]) - Progression Note    Patient Details  Name: Todd Valentine MRN: [id number] Date of Birth: [birth year]  Transition of Care [facility]) CM/SW [place], RN Phone Number: 09/27/2018, 4:21 PM  Clinical Narrative:    Jeralene Huff out to Seth Bake at [facility], They do not have a bed this week and do not anticipate that to change  Sent the bed request thru the hub to other facilities in the area      Expected Discharge Plan and Services                                                 Social Determinants of Health (SDOH) Interventions    Readmission Risk Interventions No flowsheet data found.

## 2018-09-27 NOTE — Evaluation (Signed)
Physical Therapy Evaluation Patient Details Name: Todd Valentine MRN: [id number] DOB: 1940/04/21 Today's Date: 09/27/2018   History of Present Illness  Todd Valentine is a 39yoM who comes to [facility] after acute onset pop in leg with subsequent severe pain, no associated fall. Pt was reaching for a sign to throw at tresspassers on his property. Pt has been seen by Ortho who recommends WBAT and ROMAT with return to OP ortho for surgical consult. MRI showing detachment of Right hamstrings from ischial tuberosity, as well as muscle tears in gluteus maximus, "all adductors," quadratus femoris, and fatty atrophy of rectus femoris and gluteus medius, all on Right. PTA pt was full independent community dwelling [place] works 3d/week.  Clinical Impression  RN reports pt tearful earlier with severe pain, given all pain meds he can have at this time, pt in room in supine, reports improved pain control while not moving. Pt is motivated to give best performance, but each attempt is unable to successfully get to sitting EOB. He has surprisingly fair movement of RLE in the bed, but has frequent hamstrings cramps that are arrestingly painful. Pt also mentions burning pain the the lower margin of the buttocks, "like a hot pker is being twisted in there," sounding curious for sciatic nerve irritation, but difficult to say given location of lower glute max and quadratus femoris tears described in MRI report. Be that as it may is it is difficult to imagine such a trauma to the proximal hamstrings without any involvement of the sciatic nerve. Pt does have normal sensation in RLE from knee to foot, denies paresthesias. Difficult to estimate when patient will be more tolerating of mobility given widespread injury. Pt should currently be considered bed-bound for DC planning until his pain is less limiting. Pt will benefit from skilled PT intervention to increase independence and safety with basic mobility in preparation  for discharge to the venue listed below.       Follow Up Recommendations SNF;Follow surgeon's recommendation for DC plan and follow-up therapies;Supervision for mobility/OOB;Supervision - Intermittent(Pt essentially bed bound d/t acute sevre pain; unlikely to improve dramatically in the next 48 hours; Pt very much opposed to facility placement.)    Equipment Recommendations  Other (comment)(unclear at this time.)    Recommendations for Other Services       Precautions / Restrictions Precautions Precautions: Fall Restrictions Weight Bearing Restrictions: Yes RLE Weight Bearing: Weight bearing as tolerated Other Position/Activity Restrictions: "ROMAT"      Mobility  Bed Mobility Overal bed mobility: Needs Assistance Bed Mobility: Supine to Sit     Supine to sit: Max assist;+2 for physical assistance     General bed mobility comments: max Assist provided for RLE support and hand held offer for pt to pull self up; after all attempts pt is limited by severe pain having to return to bed.  Transfers                    Ambulation/Gait                Stairs            Wheelchair Mobility    Modified Rankin (Stroke Patients Only)       Balance                                             Pertinent  Vitals/Pain Pain Assessment: Faces Pain Score: 10-Worst pain ever Pain Location: Posterior thigh and "burning hot poker" pain at Right distal buttock near gluteal fold. Pain Descriptors / Indicators: Burning;Aching;Cramping Pain Intervention(s): Limited activity within patient's tolerance;Premedicated before session    Home Living Family/patient expects to be discharged to:: Private residence Living Arrangements: Alone Available Help at Discharge: Family(Pt has 6 DTRs workign from home, 2 in [place] others nearby.) Type of Home: House Home Access: Stairs to enter Entrance Stairs-Rails: Can reach both Technical brewer of Steps:  4 Home Layout: One level Home Equipment: Environmental consultant - 2 wheels;Crutches      Prior Function Level of Independence: Independent         Comments: Works a few days/week at [employer]. independent in ADL.     Hand Dominance   Dominant Hand: Right    Extremity/Trunk Assessment        Lower Extremity Assessment Lower Extremity Assessment: RLE deficits/detail RLE: Unable to fully assess due to pain RLE Sensation: WNL RLE Coordination: decreased gross motor;decreased fine motor       Communication      Cognition Arousal/Alertness: Awake/alert Behavior During Therapy: WFL for tasks assessed/performed Overall Cognitive Status: Within Functional Limits for tasks assessed                                        General Comments      Exercises     Assessment/Plan    PT Assessment Patient needs continued PT services  PT Problem List Decreased strength;Decreased range of motion;Decreased activity tolerance;Decreased mobility;Pain       PT Treatment Interventions DME instruction;Gait training;Stair training;Functional mobility training;Therapeutic activities;Therapeutic exercise;Patient/family education    PT Goals (Current goals can be found in the Care Plan section)  Acute Rehab PT Goals Patient Stated Goal: Avoid facility placement PT Goal Formulation: With patient Time For Goal Achievement: 10/11/18 Potential to Achieve Goals: Good    Frequency 7X/week   Barriers to discharge Inaccessible home environment 5 steps to enter home    Co-evaluation               AM-PAC PT "6 Clicks" Mobility  Outcome Measure Help needed turning from your back to your side while in a flat bed without using bedrails?: Total Help needed moving from lying on your back to sitting on the side of a flat bed without using bedrails?: Total Help needed moving to and from a bed to a chair (including a wheelchair)?: Total Help needed standing up from a chair using your arms  (e.g., wheelchair or bedside chair)?: Total Help needed to walk in hospital room?: Total Help needed climbing 3-5 steps with a railing? : Total 6 Click Score: 6    End of Session   Activity Tolerance: Patient limited by pain Patient left: in bed;with call bell/phone within reach;Other (comment)(right heel floating) Nurse Communication: Mobility status PT Visit Diagnosis: Unsteadiness on feet (R26.81);Difficulty in walking, not elsewhere classified (R26.2);Muscle weakness (generalized) (M62.81)    Time: 4967-5916 PT Time Calculation (min) (ACUTE ONLY): 30 min   Charges:   PT Evaluation $PT Eval Low Complexity: 1 Low          2:58 PM, 09/27/18 Etta Grandchild, PT, DPT Physical Therapist - [facility]  [phone] ([place])   [place] C 09/27/2018, 2:52 PM

## 2018-09-28 LAB — CBC
HCT: 27.1 % — ABNORMAL LOW (ref 39.0–52.0)
Hemoglobin: 9.2 g/dL — ABNORMAL LOW (ref 13.0–17.0)
MCH: 30.2 pg (ref 26.0–34.0)
MCHC: 33.9 g/dL (ref 30.0–36.0)
MCV: 88.9 fL (ref 80.0–100.0)
Platelets: 145 10*3/uL — ABNORMAL LOW (ref 150–400)
RBC: 3.05 MIL/uL — ABNORMAL LOW (ref 4.22–5.81)
RDW: 12.6 % (ref 11.5–15.5)
WBC: 9.5 10*3/uL (ref 4.0–10.5)
nRBC: 0.2 % (ref 0.0–0.2)

## 2018-09-28 LAB — BASIC METABOLIC PANEL
Anion gap: 9 (ref 5–15)
BUN: 29 mg/dL — ABNORMAL HIGH (ref 8–23)
CO2: 25 mmol/L (ref 22–32)
Calcium: 8.7 mg/dL — ABNORMAL LOW (ref 8.9–10.3)
Chloride: 103 mmol/L (ref 98–111)
Creatinine, Ser: 1.64 mg/dL — ABNORMAL HIGH (ref 0.61–1.24)
GFR calc Af Amer: 46 mL/min — ABNORMAL LOW (ref >60)
GFR calc non Af Amer: 40 mL/min — ABNORMAL LOW (ref >60)
Glucose, Bld: 180 mg/dL — ABNORMAL HIGH (ref 70–99)
Potassium: 4.5 mmol/L (ref 3.5–5.1)
Sodium: 137 mmol/L (ref 135–145)

## 2018-09-28 LAB — GLUCOSE, CAPILLARY
Glucose-Capillary: 136 mg/dL — ABNORMAL HIGH (ref 70–99)
Glucose-Capillary: 164 mg/dL — ABNORMAL HIGH (ref 70–99)
Glucose-Capillary: 131 mg/dL — ABNORMAL HIGH (ref 70–99)
Glucose-Capillary: 182 mg/dL — ABNORMAL HIGH (ref 70–99)

## 2018-09-28 LAB — HEMOGLOBIN AND HEMATOCRIT, BLOOD
HCT: 27.9 % — ABNORMAL LOW (ref 39.0–52.0)
Hemoglobin: 9.5 g/dL — ABNORMAL LOW (ref 13.0–17.0)

## 2018-09-28 NOTE — Plan of Care (Signed)
  Problem: Education: Goal: Knowledge of General Education information will improve Description: Including pain rating scale, medication(s)/side effects and non-pharmacologic comfort measures Outcome: Progressing   Problem: Health Behavior/Discharge Planning: Goal: Ability to manage health-related needs will improve Outcome: Progressing   Problem: Clinical Measurements: Goal: Ability to maintain clinical measurements within normal limits will improve Outcome: Progressing Goal: Will remain free from infection Outcome: Progressing Goal: Diagnostic test results will improve Outcome: Progressing Goal: Respiratory complications will improve Outcome: Progressing   Problem: Activity: Goal: Risk for activity intolerance will decrease Outcome: Progressing   Problem: Nutrition: Goal: Adequate nutrition will be maintained Outcome: Progressing   Problem: Elimination: Goal: Will not experience complications related to bowel motility Outcome: Progressing Goal: Will not experience complications related to urinary retention Outcome: Progressing   Problem: Pain Managment: Goal: General experience of comfort will improve Outcome: Progressing   Problem: Safety: Goal: Ability to remain free from injury will improve Outcome: Progressing   Problem: Skin Integrity: Goal: Risk for impaired skin integrity will decrease Outcome: Progressing

## 2018-09-28 NOTE — Progress Notes (Addendum)
[place] at [place] NAME: Todd Valentine    MR#:  [id number]  DATE OF BIRTH:  06-11-1940  SUBJECTIVE:   Patient states he is feeling much better today.  He is able to flex at the knee, which he was not able to do yesterday.  He used a heating pad overnight, which he felt really helped his pain.  REVIEW OF SYSTEMS:  CONSTITUTIONAL: No fever, fatigue or weakness.  EYES: No blurred or double vision.  EARS, NOSE, AND THROAT: No tinnitus or ear pain.  RESPIRATORY: No cough, shortness of breath, wheezing or hemoptysis.  CARDIOVASCULAR: No chest pain, orthopnea, edema.  GASTROINTESTINAL: No nausea, vomiting, diarrhea or abdominal pain.  GENITOURINARY: No dysuria, hematuria.  ENDOCRINE: No polyuria, nocturia,  HEMATOLOGY: No anemia, easy bruising or bleeding SKIN: No rash or lesion. MUSCULOSKELETAL: No joint pain or arthritis.  +right leg pain NEUROLOGIC: No tingling, numbness, weakness.  PSYCHIATRY: No anxiety or depression.   DRUG ALLERGIES:   Allergies  Allergen Reactions  . Aspirin Other (See Comments)    Contraindicated - Kidney disease  . Ciprofloxacin Other (See Comments)    Other reaction(s): Other (See Comments) Pt states he could hardly move Pt states he could hardly move Pt states he could hardly move Other reaction(s): Other (See Comments) Pt states he could hardly move   . Sitagliptin Other (See Comments)    Other reaction(s): Other (See Comments) Weakness Weakness Weakness Other reaction(s): Other (See Comments) Weakness     VITALS:  Blood pressure 133/60, pulse 89, temperature 97.9 F (36.6 C), temperature source Oral, resp. rate 15, height 5' 8.5" (1.74 m), weight 92.1 kg, SpO2 99 %.  PHYSICAL EXAMINATION:  GENERAL:  78 y.o.-year-old patient lying in the bed with no acute distress.  EYES: Pupils equal, round, reactive to light and accommodation. No scleral icterus. Extraocular muscles intact.  HEENT:  Head atraumatic, normocephalic. Oropharynx and nasopharynx clear.  NECK:  Supple, no jugular venous distention. No thyroid enlargement, no tenderness.  LUNGS: Normal breath sounds bilaterally, no wheezing, rales,rhonchi or crepitation. No use of accessory muscles of respiration.  CARDIOVASCULAR: RRR, S1, S2 normal. No murmurs, rubs, or gallops.  ABDOMEN: Soft, nontender, nondistended. Bowel sounds present. No organomegaly or mass.  EXTREMITIES: No pedal edema, cyanosis, or clubbing.  +mild ecchymosis present over the posterior thigh. NEUROLOGIC: Cranial nerves II through XII are intact.  Unable to assess muscle strength in the right lower extremity secondary to pain. Sensation intact. Gait not checked.  PSYCHIATRIC: The patient is alert and oriented x 3.  SKIN: No obvious rash, lesion, or ulcer.    LABORATORY PANEL:   CBC Recent Labs  Lab 09/28/18 0440  WBC 9.5  HGB 9.2*  HCT 27.1*  PLT 145*   ------------------------------------------------------------------------------------------------------------------  Chemistries  Recent Labs  Lab 09/28/18 0440  NA 137  K 4.5  CL 103  CO2 25  GLUCOSE 180*  BUN 29*  CREATININE 1.64*  CALCIUM 8.7*   ------------------------------------------------------------------------------------------------------------------  Cardiac Enzymes No results for input(s): TROPONINI in the last 168 hours. ------------------------------------------------------------------------------------------------------------------  RADIOLOGY:  No results found.  EKG:   Orders placed or performed in visit on 09/01/18  . EKG 12-Lead    ASSESSMENT AND PLAN:   Right lower extremity pain secondary to three tendon proximal hamstring tear with significant retraction-pain is significantly improved today -Pain control -Ortho following- recommend WBAT and ROMAT -PT recommending SNF  Acute blood loss anemia- likely due to hamstring tear, as patient does  have some  bruising on exam today. -Will hold Eliquis for today and plan to restart once hemoglobin stabilizes -Recheck hemoglobin at 1800 -Transfuse as needed  Chronic atrial fibrillation- rate controlled -Continue Coreg, diltiazem -Holding Eliquis due to hemoglobin drop -IV Lopressor PRN  Hypertension- BP controlled. -Continue Coreg, clonidine, diltiazem -Hold losartan due to slight bump in creatinine -IV Lopressor PRN  CKD III-IV- creatinine better than baseline -Avoid nephrotoxic agents -Monitor  DVT prophylaxis- SCDs   All the records are reviewed and case discussed with Care Management/Social Workerr. Management plans discussed with the patient, family and they are in agreement.  CODE STATUS: Full  TOTAL TIME TAKING CARE OF THIS PATIENT: 40 minutes.   POSSIBLE D/C IN 1-2 DAYS, DEPENDING ON CLINICAL CONDITION.  Note: This dictation was prepared with Dragon dictation along with smaller phrase technology. Any transcriptional errors that result from this process are unintentional.   Evette Doffing M.D on 09/28/2018 at 12:10 PM  Between 7am to 6pm - Pager - [phone]  After 6pm go to [email or web address] - password [email or web address] [facility]  Office  [phone]  CC: Primary care physician; [name], MD

## 2018-09-28 NOTE — Progress Notes (Signed)
Physical Therapy Treatment Patient Details Name: Todd Valentine MRN: [id number] DOB: [birth year] Today's Date: 09/28/2018    History of Present Illness Todd Valentine is a 26yoM who comes to [facility] after acute onset pop in leg with subsequent severe pain, no associated fall. Pt was reaching for a sign to throw at tresspassers on his property. Pt has been seen by Ortho who recommends WBAT and ROMAT with return to OP ortho for surgical consult. MRI showing detachment of Right hamstrings from ischial tuberosity, as well as muscle tears in gluteus maximus, "all adductors," quadratus femoris, and fatty atrophy of rectus femoris and gluteus medius, all on Right. PTA pt was full independent community dwelling [place] works 3d/week.    PT Comments    Pt showed great effort with all aspects of PT session and was able to do a good bit more functionally today but is still very limited secondary to pain.  He was able to sit at EOB, but only could tolerate it with R hip off edge and in order to be able to even attempt getting to standing needed height of bed to be raised significantly (~12").  He showed surprisingly good ability to move R LE against gravity but continuously had significant pain with even lightly resisted hamstring, glute and add activities.  Pt highly motivated but with too dramatic of an injury and acute pain to be able to tolerate much activity.  Unable to to transfer to the recliner as he can not tolerate weight through R hip in sitting.   Follow Up Recommendations  SNF;Follow surgeon's recommendation for DC plan and follow-up therapies;Supervision for mobility/OOB;Supervision - Intermittent     Equipment Recommendations  (will likely need FWW, decision per next venue)    Recommendations for Other Services       Precautions / Restrictions Precautions Precautions: Fall Restrictions RLE Weight Bearing: Weight bearing as tolerated    Mobility  Bed Mobility Overal bed  mobility: Needs Assistance Bed Mobility: Supine to Sit;Sit to Supine     Supine to sit: Mod assist Sit to supine: Mod assist   General bed mobility comments: Pt able to work LEs over toward EOB, unable to tolerate weight on R hip/buttock and needed heavy assist to scoot toward EOB and to transition trunk to upright.  Able to assist with getting back into bed but required direct assist to lift/place R LE  Transfers Overall transfer level: Needs assistance Equipment used: Rolling walker (2 wheeled) Transfers: Sit to/from Stand           General transfer comment: Pt struggled to get "comfortable" at EOB, essentially with R glute/LE off edge of bed and needing very raised bed (~12") and heavy UE use along with a lot of cuing, encouragement and reinforcement  Ambulation/Gait             General Gait Details: Pt was able to tolerate some minimal WBing through R LE but clearly was very guarded and unable to tolerate true ambulation.  Needed heavy UE use and despite great effort and motivation was only able to take a few small side shuffle steps along EOB before needing to sit 2/2 R LE pain was too much to tolerate.   Stairs             Wheelchair Mobility    Modified Rankin (Stroke Patients Only)       Balance Overall balance assessment: Independent  Cognition Arousal/Alertness: Awake/alert Behavior During Therapy: WFL for tasks assessed/performed Overall Cognitive Status: Within Functional Limits for tasks assessed                                        Exercises Total Joint Exercises Ankle Circles/Pumps: 20 reps Heel Slides: AROM;10 reps;AAROM Hip ABduction/ADduction: AROM;15 reps Straight Leg Raises: AROM;AAROM;10 reps Knee Flexion: AROM;10 reps    General Comments        Pertinent Vitals/Pain Pain Assessment: 0-10 Pain Score: 7 (pain increases to 10 with pressure of sitting on  R buttock)    Home Living                      Prior Function            PT Goals (current goals can now be found in the care plan section) Progress towards PT goals: Progressing toward goals    Frequency    7X/week      PT Plan Current plan remains appropriate    Co-evaluation              AM-PAC PT "6 Clicks" Mobility   Outcome Measure  Help needed turning from your back to your side while in a flat bed without using bedrails?: A Lot Help needed moving from lying on your back to sitting on the side of a flat bed without using bedrails?: A Lot Help needed moving to and from a bed to a chair (including a wheelchair)?: A Lot Help needed standing up from a chair using your arms (e.g., wheelchair or bedside chair)?: A Lot Help needed to walk in hospital room?: Total Help needed climbing 3-5 steps with a railing? : Total 6 Click Score: 10    End of Session Equipment Utilized During Treatment: Gait belt Activity Tolerance: Patient limited by pain Patient left: in bed;with call bell/phone within reach;Other (comment) Nurse Communication: Mobility status PT Visit Diagnosis: Unsteadiness on feet (R26.81);Difficulty in walking, not elsewhere classified (R26.2);Muscle weakness (generalized) (M62.81)     Time: 3254-9826 PT Time Calculation (min) (ACUTE ONLY): 26 min  Charges:  $Therapeutic Exercise: 8-22 mins $Therapeutic Activity: 8-22 mins                     Kreg Shropshire, DPT 09/28/2018, 1:24 PM

## 2018-09-28 NOTE — Evaluation (Signed)
Occupational Therapy Evaluation Patient Details Name: Todd Valentine MRN: [id number] DOB: 08-15-1940 Today's Date: 09/28/2018    History of Present Illness Todd Valentine is a 37yoM who comes to [facility] after acute onset pop in leg with subsequent severe pain, no associated fall. Pt was reaching for a sign to throw at tresspassers on his property. Pt has been seen by Ortho who recommends WBAT and ROMAT with return to OP ortho for surgical consult. MRI showing detachment of Right hamstrings from ischial tuberosity, as well as muscle tears in gluteus maximus, "all adductors," quadratus femoris, and fatty atrophy of rectus femoris and gluteus medius, all on Right. PTA pt was full independent community dwelling [place] works 3d/week.   Clinical Impression   Pt seen for OT evaluation this date. Prior to hospital admission, pt was independent, living alone for past 2 years after spouse passed away. Currently pt demonstrates impairments in RLE functional use (strength/ROM) 2/2 significant pain with any movement. Functional mobility assessment limited due to pain. RN notified, pt now requesting pain medication. Pt reports he has been "trying not to take much" pain medication. Pt would benefit from skilled OT to address noted impairments and functional limitations (see below for any additional details) in order to maximize safety and independence while minimizing falls risk and caregiver burden.  Upon hospital discharge, recommend pt discharge to SNF, pending progression with therapy.    Follow Up Recommendations  SNF    Equipment Recommendations  3 in 1 bedside commode    Recommendations for Other Services       Precautions / Restrictions Precautions Precautions: Fall Restrictions Weight Bearing Restrictions: Yes RLE Weight Bearing: Weight bearing as tolerated Other Position/Activity Restrictions: "ROMAT"      Mobility Bed Mobility               General bed mobility comments:  pt declined 2/2 pain  Transfers                 General transfer comment: pt declined 2/2 pain    Balance                                           ADL either performed or assessed with clinical judgement   ADL                                         General ADL Comments: Max A for LB ADL, unable to tolerate attempts to do toilet transfer 2/2 pain     Vision Baseline Vision/History: No visual deficits Patient Visual Report: No change from baseline Vision Assessment?: No apparent visual deficits     Perception     Praxis      Pertinent Vitals/Pain Pain Assessment: 0-10 Pain Score: 6  Pain Location: 6/10 at rest, increasing to 10/10 with any movement in posterior thigh/R buttocks Pain Descriptors / Indicators: Burning;Aching;Cramping Pain Intervention(s): Limited activity within patient's tolerance;Monitored during session;Patient requesting pain meds-RN notified     Hand Dominance Right   Extremity/Trunk Assessment Upper Extremity Assessment Upper Extremity Assessment: Overall WFL for tasks assessed   Lower Extremity Assessment Lower Extremity Assessment: Defer to PT evaluation;RLE deficits/detail RLE: Unable to fully assess due to pain RLE Sensation: WNL RLE Coordination: decreased gross motor;decreased fine motor   Cervical /  Trunk Assessment Cervical / Trunk Assessment: Normal   Communication Communication Communication: No difficulties   Cognition   Behavior During Therapy: WFL for tasks assessed/performed Overall Cognitive Status: Within Functional Limits for tasks assessed                                     General Comments       Exercises     Shoulder Instructions      Home Living Family/patient expects to be discharged to:: Private residence Living Arrangements: Alone Available Help at Discharge: Family(Pt has 6 DTRs workign from home, 2 in [place] others nearby.) Type of Home:  House Home Access: Stairs to enter Technical brewer of Steps: 4 Entrance Stairs-Rails: Can reach both [place]: One level     Bathroom Shower/Tub: Occupational psychologist: Handicapped height     [place]: Environmental consultant - 2 wheels;Crutches;Hand held shower head;Shower seat - built in          Prior Functioning/Environment Level of Independence: Independent        Comments: Works a few days/week at [employer]. independent in ADL.        OT Problem List: Decreased strength;Decreased range of motion;Pain;Impaired balance (sitting and/or standing);Decreased knowledge of use of DME or AE      OT Treatment/Interventions: Self-care/ADL training;Therapeutic exercise;Therapeutic activities;DME and/or AE instruction;Patient/family education;Balance training    OT Goals(Current goals can be found in the care plan section) Acute Rehab OT Goals Patient Stated Goal: Avoid facility placement OT Goal Formulation: With patient Time For Goal Achievement: 10/12/18 Potential to Achieve Goals: Good ADL Goals Pt Will Perform Lower Body Dressing: with mod assist;sit to/from stand;with adaptive equipment Pt Will Transfer to Toilet: with mod assist;bedside commode;stand pivot transfer Additional ADL Goal #1: Pt will perform bed mobility with Min A in preparation for seated EOB ADL  OT Frequency: Min 2X/week   Barriers to D/C:            Co-evaluation              AM-PAC OT "6 Clicks" Daily Activity     Outcome Measure Help from another person eating meals?: None Help from another person taking care of personal grooming?: None Help from another person toileting, which includes using toliet, bedpan, or urinal?: A Lot Help from another person bathing (including washing, rinsing, drying)?: A Lot Help from another person to put on and taking off regular upper body clothing?: A Little Help from another person to put on and taking off regular lower body clothing?: A Lot 6  Click Score: 17   End of Session Nurse Communication: Patient requests pain meds  Activity Tolerance: Patient limited by pain Patient left: in bed;with call bell/phone within reach;with bed alarm set  OT Visit Diagnosis: Other abnormalities of gait and mobility (R26.89);Pain Pain - Right/Left: Right Pain - part of body: Hip;Leg                Time: 1610-9604 OT Time Calculation (min): 16 min Charges:  OT General Charges $OT Visit: 1 Visit OT Evaluation $OT Eval Low Complexity: 1 Low  Jeni Salles, MPH, MS, OTR/L ascom [phone] 09/28/18, 4:13 PM

## 2018-09-29 LAB — CBC
HCT: 25.9 % — ABNORMAL LOW (ref 39.0–52.0)
Hemoglobin: 8.8 g/dL — ABNORMAL LOW (ref 13.0–17.0)
MCH: 30.8 pg (ref 26.0–34.0)
MCHC: 34 g/dL (ref 30.0–36.0)
MCV: 90.6 fL (ref 80.0–100.0)
Platelets: 164 10*3/uL (ref 150–400)
RBC: 2.86 MIL/uL — ABNORMAL LOW (ref 4.22–5.81)
RDW: 12.4 % (ref 11.5–15.5)
WBC: 8.1 10*3/uL (ref 4.0–10.5)
nRBC: 0 % (ref 0.0–0.2)

## 2018-09-29 LAB — BASIC METABOLIC PANEL
Anion gap: 6 (ref 5–15)
BUN: 37 mg/dL — ABNORMAL HIGH (ref 8–23)
CO2: 26 mmol/L (ref 22–32)
Calcium: 8.5 mg/dL — ABNORMAL LOW (ref 8.9–10.3)
Chloride: 104 mmol/L (ref 98–111)
Creatinine, Ser: 1.55 mg/dL — ABNORMAL HIGH (ref 0.61–1.24)
GFR calc Af Amer: 49 mL/min — ABNORMAL LOW (ref >60)
GFR calc non Af Amer: 43 mL/min — ABNORMAL LOW (ref >60)
Glucose, Bld: 190 mg/dL — ABNORMAL HIGH (ref 70–99)
Potassium: 3.8 mmol/L (ref 3.5–5.1)
Sodium: 136 mmol/L (ref 135–145)

## 2018-09-29 LAB — GLUCOSE, CAPILLARY
Glucose-Capillary: 164 mg/dL — ABNORMAL HIGH (ref 70–99)
Glucose-Capillary: 161 mg/dL — ABNORMAL HIGH (ref 70–99)
Glucose-Capillary: 175 mg/dL — ABNORMAL HIGH (ref 70–99)
Glucose-Capillary: 155 mg/dL — ABNORMAL HIGH (ref 70–99)

## 2018-09-29 NOTE — TOC Progression Note (Signed)
Transition of Care [facility]) - Progression Note    Patient Details  Name: Todd Valentine MRN: [id number] Date of Birth: Aug 17, 1940  Transition of Care [facility]) CM/SW Contact  Su Hilt, RN Phone Number: 09/29/2018, 9:10 AM  Clinical Narrative:    Damaris Schooner to Butch Penny the patient's daughter, she requested that I request a bed at [facility].  I sent the information requesting the bed thru the hub and called, [facility] does not have a bed available and I left a message at [facility] requesting a call back        Expected Discharge Plan and Services                                                 Social Determinants of Health (SDOH) Interventions    Readmission Risk Interventions No flowsheet data found.

## 2018-09-29 NOTE — Progress Notes (Signed)
[place] at [place] NAME: Todd Valentine    MR#:  [id number]  DATE OF BIRTH:  [birth year]  SUBJECTIVE:   Still has significant pain in the right leg.  REVIEW OF SYSTEMS:  CONSTITUTIONAL: No fever, fatigue or weakness.  EYES: No blurred or double vision.  EARS, NOSE, AND THROAT: No tinnitus or ear pain.  RESPIRATORY: No cough, shortness of breath, wheezing or hemoptysis.  CARDIOVASCULAR: No chest pain, orthopnea, edema.  GASTROINTESTINAL: No nausea, vomiting, diarrhea or abdominal pain.  GENITOURINARY: No dysuria, hematuria.  ENDOCRINE: No polyuria, nocturia,  HEMATOLOGY: No anemia, easy bruising or bleeding SKIN: No rash or lesion. MUSCULOSKELETAL: No joint pain or arthritis.  +right leg pain NEUROLOGIC: No tingling, numbness, weakness.  PSYCHIATRY: No anxiety or depression.   DRUG ALLERGIES:   Allergies  Allergen Reactions  . Aspirin Other (See Comments)    Contraindicated - Kidney disease  . Ciprofloxacin Other (See Comments)    Other reaction(s): Other (See Comments) Pt states he could hardly move Pt states he could hardly move Pt states he could hardly move Other reaction(s): Other (See Comments) Pt states he could hardly move   . Sitagliptin Other (See Comments)    Other reaction(s): Other (See Comments) Weakness Weakness Weakness Other reaction(s): Other (See Comments) Weakness     VITALS:  Blood pressure 126/71, pulse 93, temperature 98.6 F (37 C), resp. rate 18, height 5' 8.5" (1.74 m), weight 92.1 kg, SpO2 100 %.  PHYSICAL EXAMINATION:  GENERAL:  78 y.o.-year-old patient lying in the bed with no acute distress.  EYES: Pupils equal, round, reactive to light and accommodation. No scleral icterus. Extraocular muscles intact.  HEENT: Head atraumatic, normocephalic. Oropharynx and nasopharynx clear.  NECK:  Supple, no jugular venous distention. No thyroid enlargement, no tenderness.  LUNGS: Normal breath  sounds bilaterally, no wheezing, rales,rhonchi or crepitation. No use of accessory muscles of respiration.  CARDIOVASCULAR: RRR, S1, S2 normal. No murmurs, rubs, or gallops.  ABDOMEN: Soft, nontender, nondistended. Bowel sounds present. No organomegaly or mass.  EXTREMITIES: No pedal edema, cyanosis, or clubbing.  +mild ecchymosis present over the posterior thigh. NEUROLOGIC: Cranial nerves II through XII are intact.  Unable to assess muscle strength in the right lower extremity secondary to pain. Sensation intact. Gait not checked.  PSYCHIATRIC: The patient is alert and oriented x 3.  SKIN: No obvious rash, lesion, or ulcer.    LABORATORY PANEL:   CBC Recent Labs  Lab 09/29/18 0328  WBC 8.1  HGB 8.8*  HCT 25.9*  PLT 164   ------------------------------------------------------------------------------------------------------------------  Chemistries  Recent Labs  Lab 09/29/18 0328  NA 136  K 3.8  CL 104  CO2 26  GLUCOSE 190*  BUN 37*  CREATININE 1.55*  CALCIUM 8.5*   ------------------------------------------------------------------------------------------------------------------  Cardiac Enzymes No results for input(s): TROPONINI in the last 168 hours. ------------------------------------------------------------------------------------------------------------------  RADIOLOGY:  No results found.  EKG:   Orders placed or performed in visit on 09/01/18  . EKG 12-Lead    ASSESSMENT AND PLAN:   Right lower extremity pain secondary to three tendon proximal hamstring tear with significant retraction-p pain is slowly improving- -Ortho following- recommend WBAT and ROMAT -PT recommending SNF, waiting for insurance authorization.  Acute blood loss anemia- likely due to hamstring tear, as patient does have some bruising on exam today. -Will hold Eliquis for today and plan to restart once hemoglobin stabilizes -Hemoglobin is 8.8 today again.  Eliquis, make sure  hemoglobin is stable before  discharging.  Chronic atrial fibrillation- rate controlled -Continue Coreg, diltiazem If hemoglobin stable tomorrow restart Eliquis and discharge to rehab.  Hypertension- BP controlled. -Continue Coreg, clonidine, diltiazem -Hold losartan due to slight bump in creatinine, creatinine better today than yesterday. -IV Lopressor PRN  CKD III-IV- creatinine better than baseline -Avoid nephrotoxic agents -Monitor  DVT prophylaxis- SCDs   All the records are reviewed and case discussed with Care Management/Social Workerr. Management plans discussed with the patient, family and they are in agreement.  CODE STATUS: Full  TOTAL TIME TAKING CARE OF THIS PATIENT: 40 minutes.   POSSIBLE D/C IN 1-2 DAYS, DEPENDING ON CLINICAL CONDITION.  Note: This dictation was prepared with Dragon dictation along with smaller phrase technology. Any transcriptional errors that result from this process are unintentional.   Epifanio Lesches M.D on 09/29/2018 at 11:50 AM  Between 7am to 6pm - Pager - [phone]  After 6pm go to [email or web address] - password [email or web address] [facility]  Office  [phone]  CC: Primary care physician; [name], MD

## 2018-09-29 NOTE — Progress Notes (Signed)
Physical Therapy Treatment Patient Details Name: Todd Valentine MRN: [id number] DOB: [birth year] Today's Date: 09/29/2018    History of Present Illness Todd Valentine is a 20yoM who comes to [facility] after acute onset pop in leg with subsequent severe pain, no associated fall. Pt was reaching for a sign to throw at tresspassers on his property. Pt has been seen by Ortho who recommends WBAT and ROMAT with return to OP ortho for surgical consult. MRI showing detachment of Right hamstrings from ischial tuberosity, as well as muscle tears in gluteus maximus, "all adductors," quadratus femoris, and fatty atrophy of rectus femoris and gluteus medius, all on Right. PTA pt was full independent community dwelling [place] works 3d/week.    PT Comments    Pt in bed, ready to try.  Pt with increased difficulty with mobility this am.  Does better with attempts at L side of bed as he is able to keep weight off of R hip better.  Difficulty remains however and despite mod a x 2 he is unable to remain sitting upright for more than a few seconds before stating he was going to pass out due to pain and was assisted back to supine.  Pain is a limiting factor with mobility.  Unable to progress standing or OOB today.   Follow Up Recommendations  SNF     Equipment Recommendations       Recommendations for Other Services       Precautions / Restrictions Precautions Precautions: Fall Restrictions Weight Bearing Restrictions: Yes RLE Weight Bearing: Weight bearing as tolerated    Mobility  Bed Mobility Overal bed mobility: Needs Assistance Bed Mobility: Supine to Sit;Sit to Supine     Supine to sit: Mod assist;+2 for physical assistance Sit to supine: Mod assist;+2 for physical assistance      Transfers                 General transfer comment: unable  Ambulation/Gait             General Gait Details: Futures trader    Modified  Rankin (Stroke Patients Only)       Balance Overall balance assessment: Needs assistance Sitting-balance support: Feet supported;Bilateral upper extremity supported Sitting balance-Leahy Scale: Poor Sitting balance - Comments: uanble to sit fully upright despite assist due to pain     Standing balance-Leahy Scale: Zero Standing balance comment: uanble to stand                            Cognition Arousal/Alertness: Awake/alert Behavior During Therapy: WFL for tasks assessed/performed Overall Cognitive Status: Within Functional Limits for tasks assessed                                        Exercises      General Comments        Pertinent Vitals/Pain Pain Assessment: Faces Faces Pain Scale: Hurts worst Pain Location: stated he feels he will "pass out" once sitting due to pain Pain Descriptors / Indicators: Aching;Crying;Guarding;Sore Pain Intervention(s): Monitored during session;Limited activity within patient's tolerance    Home Living                      Prior Function  PT Goals (current goals can now be found in the care plan section) Progress towards PT goals: Not progressing toward goals - comment    Frequency    7X/week      PT Plan Current plan remains appropriate    Co-evaluation              AM-PAC PT "6 Clicks" Mobility   Outcome Measure  Help needed turning from your back to your side while in a flat bed without using bedrails?: A Lot Help needed moving from lying on your back to sitting on the side of a flat bed without using bedrails?: A Lot Help needed moving to and from a bed to a chair (including a wheelchair)?: Total Help needed standing up from a chair using your arms (e.g., wheelchair or bedside chair)?: Total Help needed to walk in hospital room?: Total Help needed climbing 3-5 steps with a railing? : Total 6 Click Score: 8    End of Session   Activity Tolerance: Patient  limited by pain Patient left: in bed;with call bell/phone within reach;with bed alarm set         Time: 5456-2563 PT Time Calculation (min) (ACUTE ONLY): 12 min  Charges:  $Therapeutic Activity: 8-22 mins                    Chesley Noon, PTA 09/29/18, 1:33 PM

## 2018-09-29 NOTE — TOC Progression Note (Signed)
Transition of Care [facility]) - Progression Note    Patient Details  Name: Todd Valentine MRN: [id number] Date of Birth: 05/22/1940  [facility] CM/SW Contact  Su Hilt, RN Phone Number: 09/29/2018, 9:26 AM  Clinical Narrative:     Claiborne Billings with [facility] called and offered the bed.  I notified the daughter and accepted the bed.  They will start auth request and let me know once they get the approval.       Expected Discharge Plan and Services                                                 Social Determinants of Health (SDOH) Interventions    Readmission Risk Interventions No flowsheet data found.

## 2018-09-29 NOTE — Care Management Important Message (Signed)
Important Message  Patient Details  Name: Todd Valentine MRN: [id number] Date of Birth: 07/17/1940   Medicare Important Message Given:  Yes     Juliann Pulse A Casmir Auguste 09/29/2018, 10:51 AM

## 2018-09-30 LAB — CBC
HCT: 22 % — ABNORMAL LOW (ref 39.0–52.0)
Hemoglobin: 7.4 g/dL — ABNORMAL LOW (ref 13.0–17.0)
MCH: 30 pg (ref 26.0–34.0)
MCHC: 33.6 g/dL (ref 30.0–36.0)
MCV: 89.1 fL (ref 80.0–100.0)
Platelets: 180 10*3/uL (ref 150–400)
RBC: 2.47 MIL/uL — ABNORMAL LOW (ref 4.22–5.81)
RDW: 12.4 % (ref 11.5–15.5)
WBC: 9.7 10*3/uL (ref 4.0–10.5)
nRBC: 0 % (ref 0.0–0.2)

## 2018-09-30 LAB — GLUCOSE, CAPILLARY
Glucose-Capillary: 189 mg/dL — ABNORMAL HIGH (ref 70–99)
Glucose-Capillary: 226 mg/dL — ABNORMAL HIGH (ref 70–99)
Glucose-Capillary: 163 mg/dL — ABNORMAL HIGH (ref 70–99)
Glucose-Capillary: 192 mg/dL — ABNORMAL HIGH (ref 70–99)

## 2018-09-30 NOTE — Progress Notes (Signed)
PT Cancellation Note  Patient Details Name: Todd Valentine MRN: [id number] DOB: 12/24/1940   Cancelled Treatment:    Reason Eval/Treat Not Completed: Pain limiting ability to participate   Pt in bed.  Upon entering room pt stated "Please don't make me do that again."  Discussed with pt.  Stated pain with movement yesterday was increased.  Session yesterday attempted sitting EOB but he was unable to tolerate and only remained up for several seconds.  Stated he remained in pain for a long time after session "It felt like a hot poker sticking in me and twisting."  Encouragement provided.   Chart reviewed and discussed with primary PT on site.  Discussed with RN.   Chesley Noon 09/30/2018, 11:10 AM

## 2018-09-30 NOTE — Progress Notes (Addendum)
[place] [facility] [place] NAME: Todd Valentine    MR#:  [id number]  DATE OF BIRTH:  [birth year]  SUBJECTIVE:   Patient is in a lot of pain in the right leg even with little movement. REVIEW OF SYSTEMS:  CONSTITUTIONAL: No fever, fatigue or weakness.  EYES: No blurred or double vision.  EARS, NOSE, AND THROAT: No tinnitus or ear pain.  RESPIRATORY: No cough, shortness of breath, wheezing or hemoptysis.  CARDIOVASCULAR: No chest pain, orthopnea, edema.  GASTROINTESTINAL: No nausea, vomiting, diarrhea or abdominal pain.  GENITOURINARY: No dysuria, hematuria.  ENDOCRINE: No polyuria, nocturia,  HEMATOLOGY: No anemia, easy bruising or bleeding SKIN: No rash or lesion. MUSCULOSKELETAL: No joint pain or arthritis.  +right leg pain NEUROLOGIC: No tingling, numbness, weakness.  PSYCHIATRY: No anxiety or depression.   DRUG ALLERGIES:   Allergies  Allergen Reactions  . Aspirin Other (See Comments)    Contraindicated - Kidney disease  . Ciprofloxacin Other (See Comments)    Other reaction(s): Other (See Comments) Pt states he could hardly move Pt states he could hardly move Pt states he could hardly move Other reaction(s): Other (See Comments) Pt states he could hardly move   . Sitagliptin Other (See Comments)    Other reaction(s): Other (See Comments) Weakness Weakness Weakness Other reaction(s): Other (See Comments) Weakness     VITALS:  Blood pressure (!) 155/77, pulse (!) 102, temperature 98 F (36.7 C), resp. rate 18, height 5' 8.5" (1.74 m), weight 92.1 kg, SpO2 100 %.  PHYSICAL EXAMINATION:  GENERAL:  78 y.o.-year-old patient lying in the bed with no acute distress.  EYES: Pupils equal, round, reactive to light and accommodation. No scleral icterus. Extraocular muscles intact.  HEENT: Head atraumatic, normocephalic. Oropharynx and nasopharynx clear.  NECK:  Supple, no jugular venous distention. No thyroid enlargement, no  tenderness.  LUNGS: Normal breath sounds bilaterally, no wheezing, rales,rhonchi or crepitation. No use of accessory muscles of respiration.  CARDIOVASCULAR: RRR, S1, S2 normal. No murmurs, rubs, or gallops.  ABDOMEN: Soft, nontender, nondistended. Bowel sounds present. No organomegaly or mass.  EXTREMITIES: No pedal edema, cyanosis, or clubbing.  +mild ecchymosis present over the posterior thigh. NEUROLOGIC: Cranial nerves II through XII are intact.  Unable to assess muscle strength in the right lower extremity secondary to pain. Sensation intact. Gait not checked.  PSYCHIATRIC: The patient is alert and oriented x 3.  SKIN: No obvious rash, lesion, or ulcer.    LABORATORY PANEL:   CBC Recent Labs  Lab 09/30/18 0346  WBC 9.7  HGB 7.4*  HCT 22.0*  PLT 180   ------------------------------------------------------------------------------------------------------------------  Chemistries  Recent Labs  Lab 09/29/18 0328  NA 136  K 3.8  CL 104  CO2 26  GLUCOSE 190*  BUN 37*  CREATININE 1.55*  CALCIUM 8.5*   ------------------------------------------------------------------------------------------------------------------  Cardiac Enzymes No results for input(s): TROPONINI in the last 168 hours. ------------------------------------------------------------------------------------------------------------------  RADIOLOGY:  No results found.  EKG:   Orders placed or performed in visit on 09/01/18  . EKG 12-Lead    ASSESSMENT AND PLAN:   Right lower extremity pain secondary to three tendon proximal hamstring tear with significant retraction-significant pain, limited ambulation, patient has significant pain even with movement in the bed, spoke with orthopedic Dr. Marry Guan,  He said he will see the patient, patient condition may take a long time to recover, due to his advanced age, not a candidate for any tendon repair. Continue pain meds, muscle relaxant,  ice  packs alternating  with heat   acute blood loss anemia- likely due to hamstring tear, patient should be off on Eliquis for for now due to hamstring tear and high risk for hematomas.   Chronic atrial fibrillation- rate controlled -Continue Coreg, diltiazem If hemoglobin stable tomorrow restart Eliquis and discharge to rehab.  Hypertension- BP controlled. -Continue Coreg, clonidine, diltiazem - CKD III-IV-\diabetic nephropathy    diabetes mellitus type 2, patient has diabetic nephropathy, on Tradjenta, sliding scale insulin with coverage.   DVT prophylaxis- SCDs   All the records are reviewed and case discussed with Care Management/Social Workerr. Management plans discussed with the patient, family and they are in agreement.  CODE STATUS: Full  TOTAL TIME TAKING CARE OF THIS PATIENT: 40 minutes.   POSSIBLE D/C IN 1-2 DAYS, DEPENDING ON CLINICAL CONDITION.  Note: This dictation was prepared with Dragon dictation along with smaller phrase technology. Any transcriptional errors that result from this process are unintentional.   Epifanio Lesches M.D on 09/30/2018 at 1:30 PM  Between 7am to 6pm - Pager - [phone]  After 6pm go to [email or web address] - password [email or web address] [facility]  Office  [phone]  CC: Primary care physician; [name], MD

## 2018-09-30 NOTE — Consult Note (Addendum)
ORTHOPAEDIC CONSULTATION  PATIENT NAME: Todd Valentine DOB: 10-11-1940  MRN: [id number]  REQUESTING PHYSICIAN: Epifanio Lesches, MD  Chief Complaint: Right posterior thigh pain  HPI: Todd Valentine is a 78 y.o. male who had the onset of right posterior thigh pain 5 days ago when he picked up a + to throw and felt a "pop" in the buttocks and posterior thigh.  He had immediate onset of pain and has had difficulty with weightbearing.  He was evaluated by Dr. Marylyn Ishihara Nappo following MRI of the right thigh which demonstrated complete tear and retraction of the conjoined tendon of the biceps femoris and semitendinosus and semimembranosus tendons.  The patient had been on Eliquis for treatment of atrial fibrillation.  The Eliquis has subsequently been discontinued.  The hospital service was concerned by progressive decrease in the patient's hemoglobin as well as continued pain.  Past Medical History:  Diagnosis Date  . Acute renal failure ( [place])   . BPH (benign prostatic hyperplasia)   . Chronic atrial fibrillation   . Chronic diastolic CHF (congestive heart failure) ([name])    a. echo as above  . Chronic insomnia   . Coronary artery disease, non-occlusive 10/06/2013   a. cath 09/2013: pLAD 40, CTO Diag, ramus 40-->80, OM 50 mRCA 40, med rx  . Depression   . Diabetes mellitus with complication ([place])   . History of diverticulitis   . Hyperkalemia   . Hyperlipidemia   . Hypertension   . Malignant melanoma ([place])   . Mitral regurgitation    a. echo 09/2013: EF 55-60%, mildly dilated left atrrium, mild to moderate MR/TR  . Sleep apnea in adult    Past Surgical History:  Procedure Laterality Date  . APPENDECTOMY    . CARDIAC CATHETERIZATION  10/06/2013  . COLONOSCOPY    . COLONOSCOPY WITH PROPOFOL N/A 11/23/2014   Procedure: COLONOSCOPY WITH PROPOFOL;  Surgeon: Manya Silvas, MD;  Location[facility] ENDOSCOPY;  Service: Endoscopy;  Laterality: N/A;  . MOHS SURGERY    . removal tubular  adenoma     Social History   Socioeconomic History  . Marital status: Widowed    Spouse name: Not on file  . Number of children: Not on file  . Years of education: Not on file  . Highest education level: Not on file  Occupational History  . Not on file  Social Needs  . Financial resource strain: Not on file  . Food insecurity    Worry: Not on file    Inability: Not on file  . Transportation needs    Medical: Not on file    Non-medical: Not on file  Tobacco Use  . Smoking status: Former Research scientist (life sciences)  . Smokeless tobacco: Never Used  Substance and Sexual Activity  . Alcohol use: Yes  . Drug use: No  . Sexual activity: Not on file  Lifestyle  . Physical activity    Days per week: Not on file    Minutes per session: Not on file  . Stress: Not on file  Relationships  . Social Herbalist on phone: Not on file    Gets together: Not on file    Attends religious service: Not on file    Active member of club or organization: Not on file    Attends meetings of clubs or organizations: Not on file    Relationship status: Not on file  Other Topics Concern  . Not on file  Social History Narrative  . Not on  file   Family History  Family history unknown: Yes   Allergies  Allergen Reactions  . Aspirin Other (See Comments)    Contraindicated - Kidney disease  . Ciprofloxacin Other (See Comments)    Other reaction(s): Other (See Comments) Pt states he could hardly move Pt states he could hardly move Pt states he could hardly move Other reaction(s): Other (See Comments) Pt states he could hardly move   . Sitagliptin Other (See Comments)    Other reaction(s): Other (See Comments) Weakness Weakness Weakness Other reaction(s): Other (See Comments) Weakness    Prior to Admission medications   Medication Sig Start Date End Date Taking? Authorizing Provider  acetaminophen (TYLENOL) 500 MG tablet Take 1,000 mg by mouth every 6 (six) hours as needed.   Yes [provider]  apixaban (ELIQUIS) 5 MG TABS tablet Take 1 tablet (5 mg total) by mouth 2 (two) times daily. 08/25/17  Yes Gollan, Kathlene November, MD  atorvastatin (LIPITOR) 20 MG tablet Take 20 mg by mouth daily.   Yes [provider]  carvedilol (COREG) 25 MG tablet Take 1 tablet (25 mg total) by mouth 2 (two) times daily with a meal. 05/19/16  Yes Dustin Flock, MD  cloNIDine (CATAPRES) 0.1 MG tablet TAKE 2 TABLETS BY MOUTH TWICE DAILY 12/11/17  Yes Gollan, Kathlene November, MD  diltiazem (CARDIZEM CD) 120 MG 24 hr capsule Take 1 capsule by mouth once daily 09/06/18  Yes Gollan, Kathlene November, MD  famotidine (PEPCID) 20 MG tablet Take 1 tablet (20 mg total) by mouth at bedtime. 05/19/16  Yes Dustin Flock, MD  furosemide (LASIX) 20 MG tablet Take 20 mg by mouth daily.   Yes [provider]  glimepiride (AMARYL) 4 MG tablet Take 4 mg by mouth 2 (two) times daily.    Yes [provider]  isosorbide mononitrate (IMDUR) 30 MG 24 hr tablet TAKE 1 TABLET BY MOUTH ONCE DAILY 03/17/18  Yes Gollan, Kathlene November, MD  linagliptin (TRADJENTA) 5 MG TABS tablet Take 5 mg by mouth daily.   Yes [provider]  losartan (COZAAR) 50 MG tablet Take 50 mg by mouth daily. 06/14/18  Yes [provider]  magnesium oxide (MAG-OX) 400 (241.3 Mg) MG tablet Take 1 tablet by mouth daily. 09/05/18  Yes [provider]  Potassium Chloride ER 20 MEQ TBCR Take 1 tablet by mouth once daily 09/06/18  Yes Gollan, Kathlene November, MD  tamsulosin (FLOMAX) 0.4 MG CAPS capsule Take 0.4 mg by mouth daily.  12/10/13  Yes [provider]  traZODone (DESYREL) 50 MG tablet Take 50 mg by mouth at bedtime as needed for sleep.   Yes [provider]  nitroGLYCERIN (NITROSTAT) 0.4 MG SL tablet Place 1 tablet (0.4 mg total) every 5 (five) minutes as needed under the tongue for chest pain. 01/01/17   Minna Merritts, MD   No results found.  Positive ROS: All other systems have been reviewed and were  otherwise negative with the exception of those mentioned in the HPI and as above.  Physical Exam: General: Well developed, well nourished male seen in no acute distress.  He was awake and answer questions, although somewhat lethargic and drowsy.  MUSCULOSKELETAL: Examination of the right lower extremity demonstrated some mild ecchymosis to the lateral aspect of the thigh.  Mild to moderate swelling was noted to the thigh, although compartments were soft.  There was tenderness to palpation along the ischium.  No tenderness to palpation about the knee.  No  knee effusion.  Calf was supple.  Pedal pulses were palpable.  Assessment: Right proximal hamstring tear with retraction  Plan: The findings were discussed with the patient and his daughter.  I agree with holding the Eliquis.  Compression shorts may also be of some benefit both for comfort and control of swelling.  I will investigate the options of nonsurgical versus surgical treatment given the patient's age and comorbidities.  In the meantime, continue with physical therapy with weightbearing as tolerated.  Armstead Heiland P. Holley Bouche M.D.

## 2018-09-30 NOTE — Progress Notes (Addendum)
OT Cancellation Note  Patient Details Name: Todd Valentine MRN: [id number] DOB: 03/01/1940   Cancelled Treatment:    Reason Eval/Treat Not Completed: Fatigue/lethargy limiting ability to participate;Other (comment) Pt with somewhat low H/H (Hgb 7.4) but RN okay's therapy. However, pt politely declines. OTR attempts to motivate pt to engage in therapy but despite ongoing education, pt still declines participation-stating he needs a nap and "Just can't make it right now". Will f/u as able.   Gerrianne Scale, MS, OTR/L ascom [phone] or [phone] 09/30/18, 1:46 PM

## 2018-09-30 NOTE — Progress Notes (Signed)
   Patient Details Name: Todd Valentine MRN: [id number] DOB: 1940/12/01   Walking by pt room this pm.  Pt standing with nurse tech at bedside trying to get to bathroom.  Pt had told tech he was able to walk but once up he was unable.  Overall some improved mobility but required mod a x 2 to return to supine.  Unable to tolerate WB RLE.  Assisted as needed.     Chesley Noon 09/30/2018, 3:36 PM

## 2018-10-01 LAB — CBC
HCT: 23.9 % — ABNORMAL LOW (ref 39.0–52.0)
Hemoglobin: 8 g/dL — ABNORMAL LOW (ref 13.0–17.0)
MCH: 30.1 pg (ref 26.0–34.0)
MCHC: 33.5 g/dL (ref 30.0–36.0)
MCV: 89.8 fL (ref 80.0–100.0)
Platelets: 208 10*3/uL (ref 150–400)
RBC: 2.66 MIL/uL — ABNORMAL LOW (ref 4.22–5.81)
RDW: 12.6 % (ref 11.5–15.5)
WBC: 9.9 10*3/uL (ref 4.0–10.5)
nRBC: 0 % (ref 0.0–0.2)

## 2018-10-01 LAB — GLUCOSE, CAPILLARY
Glucose-Capillary: 157 mg/dL — ABNORMAL HIGH (ref 70–99)
Glucose-Capillary: 226 mg/dL — ABNORMAL HIGH (ref 70–99)
Glucose-Capillary: 191 mg/dL — ABNORMAL HIGH (ref 70–99)
Glucose-Capillary: 169 mg/dL — ABNORMAL HIGH (ref 70–99)

## 2018-10-01 NOTE — TOC Progression Note (Signed)
Transition of Care [facility]) - Progression Note    Patient Details  Name: Todd Valentine MRN: [id number] Date of Birth: 09-30-1940  [facility]) CM/SW Contact  Su Hilt, RN Phone Number: 10/01/2018, 11:53 AM  Clinical Narrative:    Awaiting insurance auth to go to [facility] rehab center Surgeon to speak to another Psychologist, sport and exercise at [facility] about the possibility of Surgery but no surgery planned at this time       Expected Discharge Plan and Services                                                 Social Determinants of Health (SDOH) Interventions    Readmission Risk Interventions No flowsheet data found.

## 2018-10-01 NOTE — Progress Notes (Signed)
Physical Therapy Treatment Patient Details Name: Todd Valentine MRN: [id number] DOB: [birth year] Today's Date: 10/01/2018    History of Present Illness Todd Valentine is a 24yoM who comes to [facility] after acute onset pop in leg with subsequent severe pain, no associated fall. Pt was reaching for a sign to throw at tresspassers on his property. Pt has been seen by Ortho who recommends WBAT and ROMAT with return to OP ortho for surgical consult. MRI showing detachment of Right hamstrings from ischial tuberosity, as well as muscle tears in gluteus maximus, "all adductors," quadratus femoris, and fatty atrophy of rectus femoris and gluteus medius, all on Right. PTA pt was full independent community dwelling [place] works 3d/week.    PT Comments    Pt continues to be willing to participate and do what he can to help his situation, but ultimately is extremely pain limited functionally. He showed good effort with supine exercises, R side limited secondary to pain but generally tolerated AROM/lightly resisted except for hip extension specific movements.  Pt absolutely unable to take sitting WBing through R ischial tuberocity and despite great effort and motivation with standing/weight shifts he has no functional tolerance with standing.    Follow Up Recommendations  SNF     Equipment Recommendations  (likely will need a walker, TBD at next venue of care)    Recommendations for Other Services       Precautions / Restrictions Precautions Precautions: Fall Restrictions RLE Weight Bearing: Weight bearing as tolerated    Mobility  Bed Mobility Overal bed mobility: Needs Assistance Bed Mobility: Supine to Sit;Sit to Supine     Supine to sit: Mod assist Sit to supine: Mod assist   General bed mobility comments: pt needing to get R hip off EOB before transitioning and needed assist to phyiscally hold R LE while transitiong secondary to considerable pain  Transfers Overall transfer  level: Needs assistance Equipment used: Rolling walker (2 wheeled) Transfers: Sit to/from Stand Sit to Stand: Min assist         General transfer comment: very elevated bed height, able to rise using L side almost entirely, heavy reliance on walker to stabilize and clearly in a lot of pain t/o the effort  Ambulation/Gait             General Gait Details: Pt was able to manage ~6 small, side-shuffling steps with decreasd R stance time along EOB but unable to commit to forward ambulation to get away from bed.  Pt in excrutiating pain t/o the effort.    Stairs             Wheelchair Mobility    Modified Rankin (Stroke Patients Only)       Balance Overall balance assessment: Needs assistance     Sitting balance - Comments: Able to maintian sitting with only L hip/glute on bed, must keep weight entirely off R ischium.  Good safety considering the inherent safety issues with this posturing                                    Cognition Arousal/Alertness: Awake/alert Behavior During Therapy: WFL for tasks assessed/performed Overall Cognitive Status: Within Functional Limits for tasks assessed  Exercises General Exercises - Lower Extremity Ankle Circles/Pumps: Strengthening;10 reps Quad Sets: Strengthening;15 reps Short Arc Quad: Strengthening;10 reps Heel Slides: AROM;10 reps(pain with no resist leg extensions) Hip ABduction/ADduction: AROM;10 reps Straight Leg Raises: AAROM;AROM;10 reps(unable to elevated w/o assist on R)    General Comments        Pertinent Vitals/Pain Pain Assessment: 0-10 Pain Score: 8  Pain Location: Pt still clearly with a lot of pain in R posterior thigh    Home Living                      Prior Function            PT Goals (current goals can now be found in the care plan section) Progress towards PT goals: Progressing toward goals     Frequency    7X/week      PT Plan Current plan remains appropriate    Co-evaluation              AM-PAC PT "6 Clicks" Mobility   Outcome Measure  Help needed turning from your back to your side while in a flat bed without using bedrails?: A Little Help needed moving from lying on your back to sitting on the side of a flat bed without using bedrails?: A Lot Help needed moving to and from a bed to a chair (including a wheelchair)?: Total Help needed standing up from a chair using your arms (e.g., wheelchair or bedside chair)?: A Lot Help needed to walk in hospital room?: Total Help needed climbing 3-5 steps with a railing? : Total 6 Click Score: 10    End of Session Equipment Utilized During Treatment: Gait belt Activity Tolerance: Patient limited by pain Patient left: in bed;with call bell/phone within reach;with bed alarm set Nurse Communication: Mobility status PT Visit Diagnosis: Unsteadiness on feet (R26.81);Difficulty in walking, not elsewhere classified (R26.2);Muscle weakness (generalized) (M62.81)     Time: 2500-3704 PT Time Calculation (min) (ACUTE ONLY): 27 min  Charges:  $Therapeutic Exercise: 8-22 mins $Therapeutic Activity: 8-22 mins                     Kreg Shropshire, DPT 10/01/2018, 1:47 PM

## 2018-10-01 NOTE — TOC Progression Note (Signed)
Transition of Care [facility]) - Progression Note    Patient Details  Name: Todd Valentine MRN: [id number] Date of Birth: November 23, 1940  Transition of Care [facility]) CM/SW Craig, RN Phone Number: 10/01/2018, 1:21 PM  Clinical Narrative:     [facility] called and stated that Holland Falling is requesting a Peer to peer call, the number to call is [phone] The call will need to take place before 4, I notified the physician       Expected Discharge Plan and Services                                                 Social Determinants of Health (SDOH) Interventions    Readmission Risk Interventions No flowsheet data found.

## 2018-10-01 NOTE — Progress Notes (Signed)
OT Cancellation Note  Patient Details Name: Todd Valentine MRN: [id number] DOB: Jan 28, 1941   Cancelled Treatment:    Reason Eval/Treat Not Completed: Fatigue/lethargy limiting ability to participate. Pt sleeping very soundly upon attempt. Does not wake to verbal or tactile cues. Will re-attempt OT tx session at later date/time as pt is able to participate.   Jeni Salles, MPH, MS, OTR/L ascom [phone] 10/01/18, 2:36 PM

## 2018-10-01 NOTE — Consult Note (Signed)
ORTHOPAEDIC CONSULTATION  PATIENT NAME: Todd Valentine DOB: [birth year]  MRN: [id number]  Subjective: The patient rested well last night.  He states that he feels better today. The patient participated with physical therapy today and, although somewhat limited by pain, was better than efforts yesterday.  Physical Exam: General: Well developed, well nourished male seen in no acute distress.  He was awake and answer questions, although somewhat lethargic and drowsy.  MUSCULOSKELETAL: Examination of the right lower extremity demonstrated some mild ecchymosis to the lateral aspect of the thigh.  Mild to moderate swelling was noted to the thigh, although compartments were soft.  There was tenderness to palpation along the ischium.  No tenderness to palpation about the knee.  No knee effusion.  Calf was supple.  Pedal pulses were palpable.  Assessment: Right proximal hamstring tear with retraction  Plan: I discussed the patient's status and reviewed the MRI with Dr. Elenore Rota.  He agreed with my previous recommendations for holding the Eliquis and continuation of physical therapy.  I anticipate reevaluation of the patient's status in 4 to 6 weeks before determining the need for surgical intervention.  The patient has apparently been approved for rehab.  He may continue with physical therapy with weightbearing as tolerated.  James P. Holley Bouche M.D.

## 2018-10-01 NOTE — Progress Notes (Addendum)
Called  [employer] isPeer to peer call, the number to call is [phone], spoke with representative,  Spoke with Dr. Mariea Clonts who called me back, discussed about the patient, he said he will approve the rehab stay as patient is improving and hopeful he will participate in the rehab.  I called patient's daughter Butch Penny and explained about the lab findings, she is very appreciative of me calling her.  As patient is improving, hemoglobin is better, his pain also is slightly better than yesterday we are hopeful that rehab will improve his multiple tears in the right hamstring.

## 2018-10-01 NOTE — Care Management Important Message (Signed)
Important Message  Patient Details  Name: Todd Valentine MRN: [id number] Date of Birth: 1940-04-02   Medicare Important Message Given:  Yes     Juliann Pulse A [name] 10/01/2018, 10:31 AM

## 2018-10-01 NOTE — Progress Notes (Signed)
[place] [facility] [place] NAME: Todd Valentine    MR#:  [id number]  DATE OF BIRTH:  Jul 28, 1940  SUBJECTIVE:   Still has right thigh pain and not able to move even in the bed. REVIEW OF SYSTEMS:  CONSTITUTIONAL: No fever, fatigue or weakness.  EYES: No blurred or double vision.  EARS, NOSE, AND THROAT: No tinnitus or ear pain.  RESPIRATORY: No cough, shortness of breath, wheezing or hemoptysis.  CARDIOVASCULAR: No chest pain, orthopnea, edema.  GASTROINTESTINAL: No nausea, vomiting, diarrhea or abdominal pain.  GENITOURINARY: No dysuria, hematuria.  ENDOCRINE: No polyuria, nocturia,  HEMATOLOGY: No anemia, easy bruising or bleeding SKIN: No rash or lesion. MUSCULOSKELETAL: Right thigh pain nEUROLOGIC: No tingling, numbness, weakness.  PSYCHIATRY: No anxiety or depression.   DRUG ALLERGIES:   Allergies  Allergen Reactions  . Aspirin Other (See Comments)    Contraindicated - Kidney disease  . Ciprofloxacin Other (See Comments)    Other reaction(s): Other (See Comments) Pt states he could hardly move Pt states he could hardly move Pt states he could hardly move Other reaction(s): Other (See Comments) Pt states he could hardly move   . Sitagliptin Other (See Comments)    Other reaction(s): Other (See Comments) Weakness Weakness Weakness Other reaction(s): Other (See Comments) Weakness     VITALS:  Blood pressure 139/66, pulse 88, temperature 98.9 F (37.2 C), temperature source Oral, resp. rate 16, height 5' 8.5" (1.74 m), weight 92.1 kg, SpO2 98 %.  PHYSICAL EXAMINATION:  GENERAL:  78 y.o.-year-old patient lying in the bed with no acute distress.  EYES: Pupils equal, round, reactive to light and accommodation. No scleral icterus. Extraocular muscles intact.  HEENT: Head atraumatic, normocephalic. Oropharynx and nasopharynx clear.  NECK:  Supple, no jugular venous distention. No thyroid enlargement, no tenderness.   LUNGS: Normal breath sounds bilaterally, no wheezing, rales,rhonchi or crepitation. No use of accessory muscles of respiration.  CARDIOVASCULAR: RRR, S1, S2 normal. No murmurs, rubs, or gallops.  ABDOMEN: Soft, nontender, nondistended. Bowel sounds present. No organomegaly or mass.  EXTREMITIES: Right lower extremity demonstrates ecchymosis, swelling present in the thigh mostly in the posterior aspect, compartments are soft.  Intact pedal pulse. NEUROLOGIC: Cranial nerves II through XII are intact.  Unable to assess muscle strength in the right lower extremity secondary to pain. Sensation intact. Gait not checked.  PSYCHIATRIC: The patient is alert and oriented x 3.  SKIN: No obvious rash, lesion, or ulcer.    LABORATORY PANEL:   CBC Recent Labs  Lab 10/01/18 0833  WBC 9.9  HGB 8.0*  HCT 23.9*  PLT 208   ------------------------------------------------------------------------------------------------------------------  Chemistries  Recent Labs  Lab 09/29/18 0328  NA 136  K 3.8  CL 104  CO2 26  GLUCOSE 190*  BUN 37*  CREATININE 1.55*  CALCIUM 8.5*   ------------------------------------------------------------------------------------------------------------------  Cardiac Enzymes No results for input(s): TROPONINI in the last 168 hours. ------------------------------------------------------------------------------------------------------------------  RADIOLOGY:  No results found.  EKG:   Orders placed or performed in visit on 09/01/18  . EKG 12-Lead    ASSESSMENT AND PLAN:   Right lower extremity pain secondary to three tendon proximal hamstring tear with significant retraction-significant pain, limited ambulation, patient has significant pain even with movement in the bed,  Followed by Dr. Marry Guan again yesterday, recommended compression shorts, continue physical therapy as tolerated, heat alternating with cold, he will investigate options of nonsurgical versus  surgical option due to his age, comorbidities.  acute blood loss anemia-  likely due to hamstring tear, patient should be off on Eliquis for for now due to hamstring tear and high risk for hematomas.  Hemoglobin is 8 today.  Patient hemoglobin was 7.4 yesterday.   Chronic atrial fibrillation- rate controlled -Continue Coreg, diltiazem, epic text message Dr. Esmond Plants as primary cardiologist is Dr. Rockey Situ, explained to him that the Eliquis is on hold due to his tendon rupture and hematoma.  IfHypertension- BP controlled. -Continue Coreg, clonidine, diltiazem - CKD III-IV-\diabetic nephropathy   Hallucinations with narcotics, use Tylenol, Flexeril only. diabetes mellitus type 2, patient has diabetic nephropathy, on Tradjenta, sliding scale insulin with coverage. Spoke with patient's daughter over the phone.  DVT prophylaxis- SCDs   All the records are reviewed and case discussed with Care Management/Social Workerr. Management plans discussed with the patient, family and they are in agreement.  CODE STATUS: Full  TOTAL TIME TAKING CARE OF THIS PATIENT: 40 minutes.  More than 50% time spent in counseling, coordination of care. POSSIBLE D/C IN 1-2 DAYS, DEPENDING ON CLINICAL CONDITION.  Note: This dictation was prepared with Dragon dictation along with smaller phrase technology. Any transcriptional errors that result from this process are unintentional.   Todd Valentine M.D on 10/01/2018 at 3:10 PM  Between 7am to 6pm - Pager - [phone]  After 6pm go to [email or web address] - password [email or web address] [email or web address]  Office  [phone]  CC: Primary care physician; [name], MD

## 2018-10-02 DIAGNOSIS — N183 Chronic kidney disease, stage 3 (moderate): Secondary | ICD-10-CM | POA: Diagnosis not present

## 2018-10-02 DIAGNOSIS — S76311A Strain of muscle, fascia and tendon of the posterior muscle group at thigh level, right thigh, initial encounter: Secondary | ICD-10-CM | POA: Diagnosis not present

## 2018-10-02 DIAGNOSIS — Z7401 Bed confinement status: Secondary | ICD-10-CM | POA: Diagnosis not present

## 2018-10-02 DIAGNOSIS — R609 Edema, unspecified: Secondary | ICD-10-CM | POA: Diagnosis not present

## 2018-10-02 DIAGNOSIS — W19XXXA Unspecified fall, initial encounter: Secondary | ICD-10-CM | POA: Diagnosis not present

## 2018-10-02 DIAGNOSIS — D649 Anemia, unspecified: Secondary | ICD-10-CM | POA: Diagnosis not present

## 2018-10-02 DIAGNOSIS — S76391D Other specified injury of muscle, fascia and tendon of the posterior muscle group at thigh level, right thigh, subsequent encounter: Secondary | ICD-10-CM | POA: Diagnosis not present

## 2018-10-02 DIAGNOSIS — K5900 Constipation, unspecified: Secondary | ICD-10-CM | POA: Diagnosis not present

## 2018-10-02 DIAGNOSIS — G8911 Acute pain due to trauma: Secondary | ICD-10-CM | POA: Diagnosis not present

## 2018-10-02 DIAGNOSIS — E876 Hypokalemia: Secondary | ICD-10-CM | POA: Diagnosis not present

## 2018-10-02 DIAGNOSIS — E118 Type 2 diabetes mellitus with unspecified complications: Secondary | ICD-10-CM | POA: Diagnosis not present

## 2018-10-02 DIAGNOSIS — E119 Type 2 diabetes mellitus without complications: Secondary | ICD-10-CM | POA: Diagnosis not present

## 2018-10-02 DIAGNOSIS — G4700 Insomnia, unspecified: Secondary | ICD-10-CM | POA: Diagnosis not present

## 2018-10-02 DIAGNOSIS — I10 Essential (primary) hypertension: Secondary | ICD-10-CM | POA: Diagnosis not present

## 2018-10-02 DIAGNOSIS — I4891 Unspecified atrial fibrillation: Secondary | ICD-10-CM | POA: Diagnosis not present

## 2018-10-02 DIAGNOSIS — E785 Hyperlipidemia, unspecified: Secondary | ICD-10-CM | POA: Diagnosis not present

## 2018-10-02 DIAGNOSIS — M62838 Other muscle spasm: Secondary | ICD-10-CM | POA: Diagnosis not present

## 2018-10-02 DIAGNOSIS — M2550 Pain in unspecified joint: Secondary | ICD-10-CM | POA: Diagnosis not present

## 2018-10-02 DIAGNOSIS — Z7409 Other reduced mobility: Secondary | ICD-10-CM | POA: Diagnosis not present

## 2018-10-02 DIAGNOSIS — E669 Obesity, unspecified: Secondary | ICD-10-CM | POA: Diagnosis not present

## 2018-10-02 DIAGNOSIS — D62 Acute posthemorrhagic anemia: Secondary | ICD-10-CM | POA: Diagnosis not present

## 2018-10-02 DIAGNOSIS — I5032 Chronic diastolic (congestive) heart failure: Secondary | ICD-10-CM | POA: Diagnosis not present

## 2018-10-02 DIAGNOSIS — R52 Pain, unspecified: Secondary | ICD-10-CM | POA: Diagnosis not present

## 2018-10-02 DIAGNOSIS — R262 Difficulty in walking, not elsewhere classified: Secondary | ICD-10-CM | POA: Diagnosis not present

## 2018-10-02 DIAGNOSIS — I959 Hypotension, unspecified: Secondary | ICD-10-CM | POA: Diagnosis not present

## 2018-10-02 DIAGNOSIS — S76311D Strain of muscle, fascia and tendon of the posterior muscle group at thigh level, right thigh, subsequent encounter: Secondary | ICD-10-CM | POA: Diagnosis not present

## 2018-10-02 DIAGNOSIS — E559 Vitamin D deficiency, unspecified: Secondary | ICD-10-CM | POA: Diagnosis not present

## 2018-10-02 DIAGNOSIS — N400 Enlarged prostate without lower urinary tract symptoms: Secondary | ICD-10-CM | POA: Diagnosis not present

## 2018-10-02 DIAGNOSIS — N401 Enlarged prostate with lower urinary tract symptoms: Secondary | ICD-10-CM | POA: Diagnosis not present

## 2018-10-02 DIAGNOSIS — I509 Heart failure, unspecified: Secondary | ICD-10-CM | POA: Diagnosis not present

## 2018-10-02 DIAGNOSIS — M6281 Muscle weakness (generalized): Secondary | ICD-10-CM | POA: Diagnosis not present

## 2018-10-02 LAB — GLUCOSE, CAPILLARY
Glucose-Capillary: 155 mg/dL — ABNORMAL HIGH (ref 70–99)
Glucose-Capillary: 221 mg/dL — ABNORMAL HIGH (ref 70–99)

## 2018-10-02 MED ORDER — HYDROCODONE-ACETAMINOPHEN 5-325 MG PO TABS: 1.0000 | ORAL_TABLET | ORAL | 0 refills | Status: DC | PRN

## 2018-10-02 MED ORDER — TRAMADOL HCL 50 MG PO TABS: 50.0000 mg | ORAL_TABLET | Freq: Four times a day (QID) | ORAL | 0 refills | Status: DC | PRN

## 2018-10-02 MED ORDER — LOSARTAN POTASSIUM 25 MG PO TABS: 25.0000 mg | ORAL_TABLET | Freq: Every day | ORAL | Status: DC

## 2018-10-02 MED ORDER — CYCLOBENZAPRINE HCL 10 MG PO TABS: 10.0000 mg | ORAL_TABLET | Freq: Three times a day (TID) | ORAL | 0 refills | Status: DC | PRN

## 2018-10-02 MED ORDER — POLYETHYLENE GLYCOL 3350 17 G PO PACK: 17.0000 g | PACK | Freq: Every day | ORAL | 0 refills | Status: DC | PRN

## 2018-10-02 NOTE — TOC Transition Note (Signed)
Transition of Care [facility]) - CM/SW Discharge Note   Patient Details  Name: Todd Valentine MRN: [id number] Date of Birth: 02-04-1941  Transition of Care [facility]) CM/SW Contact:  Weston Anna, LCSW Phone Number: 10/02/2018, 1:14 PM   Clinical Narrative:      Patient set to discharge to [facility] notified patients daughter, Butch Penny, who is agreeable to plan. Please call report to [phone]       Patient Goals and CMS Choice        Discharge Placement                       Discharge Plan and Services                                     Social Determinants of Health (SDOH) Interventions     Readmission Risk Interventions No flowsheet data found.

## 2018-10-02 NOTE — Progress Notes (Signed)
Physical Therapy Treatment Patient Details Name: Todd Valentine MRN: [id number] DOB: 11-17-1940 Today's Date: 10/02/2018    History of Present Illness Todd Valentine is a 45yoM who comes to [facility] after acute onset pop in leg with subsequent severe pain, no associated fall. Pt was reaching for a sign to throw at t[place]pass[place] on his property. Pt has been seen by Ortho who recommends WBAT and ROMAT with return to OP ortho for surgical consult. MRI showing detachment of Right hamstrings from ischial tuberosity, as well as muscle tears in gluteus maximus, "all adductors," quadratus femoris, and fatty atrophy of rectus femoris and gluteus medius, all on Right. PTA pt was full independent community dwelling [place] works 3d/week.    PT Comments    Pt had his best session yet and though he is still dealing with a lot of pain and unable to tolerate sitting on R he was able to rise from a lower surface height and actually ambulate forward and back while tolerating >30 seconds of standing/WBing.  He showed good overall effort and does surprisingly well with AROM with most exercises, hip extension excepted.  Pt still completely unable to be able to manage at home alone and requires STR but ultimately is making good gains.   Follow Up Recommendations  SNF     Equipment Recommendations  (TBD at next venue of care)    Recommendations for Other Services       Precautions / Restrictions Precautions Precautions: Fall Restrictions RLE Weight Bearing: Weight bearing as tolerated    Mobility  Bed Mobility Overal bed mobility: Needs Assistance Bed Mobility: Supine to Sit;Sit to Supine     Supine to sit: Mod assist Sit to supine: Mod assist   General bed mobility comments: Given inability to bear weight through R buttock he is still requiring considerable assist to transition from supine to sit  Transfers Overall transfer level: Needs assistance Equipment used: Rolling walker (2  wheeled) Transfers: Sit to/from Stand Sit to Stand: Min assist         General transfer comment: elevated bed height ~8", able to use UEs to push up much more confidently today, still needing light assist to get weight forward and over L  Ambulation/Gait Ambulation/Gait assistance: Supervision Gait Distance (Feet): 10 Feet Assistive device: Rolling walker (2 wheeled)       General Gait Details: Pt was able to do side stepping along EOB with much greater confidence today, was safe and confident enough to try multiple bouts of a few forward and backward steps with no phyiscal assist required.  He showed good effort and had much less discomfort with WBing/standing tolerance on R, however still very reliant on walker and needing to quickly sit after the effort.    Stairs             Wheelchair Mobility    Modified Rankin (Stroke Patients Only)       Balance Overall balance assessment: Needs assistance Sitting-balance support: Feet supported;Bilateral upper extremity supported Sitting balance-Leahy Scale: Poor Sitting balance - Comments: Able to maintian sitting with only L hip/glute on bed, must keep weight entirely off R ischium.  Good safety considering the inherent safety issues with this posturing   Standing balance support: Bilateral upper extremity supported Standing balance-Leahy Scale: Fair Standing balance comment: Pt much more confident with standing balance using walker, still hesitant to fully use R LE  Cognition Arousal/Alertness: Awake/alert Behavior During Therapy: WFL for tasks assessed/performed Overall Cognitive Status: Within Functional Limits for tasks assessed                                        Exercises Total Joint Exercises Ankle Circles/Pumps: AROM;Strengthening;10 reps Short Arc Quad: Strengthening;15 reps Heel Slides: AROM;10 reps Hip ABduction/ADduction: Strengthening;AROM;10  reps Straight Leg Raises: AAROM;10 reps(able to do a few reps on the R w/o assist) Knee Flexion: AROM;10 reps    General Comments        Pertinent Vitals/Pain Pain Assessment: 0-10 Pain Score: 8 (very minimal pain at rest today, increases with any mvt)    Home Living                      Prior Function            PT Goals (current goals can now be found in the care plan section) Progress towards PT goals: Progressing toward goals    Frequency    7X/week      PT Plan Current plan remains appropriate    Co-evaluation              AM-PAC PT "6 Clicks" Mobility   Outcome Measure  Help needed turning from your back to your side while in a flat bed without using bedrails?: A Little Help needed moving from lying on your back to sitting on the side of a flat bed without using bedrails?: A Lot Help needed moving to and from a bed to a chair (including a wheelchair)?: A Lot Help needed standing up from a chair using your arms (e.g., wheelchair or bedside chair)?: A Little Help needed to walk in hospital room?: A Lot Help needed climbing 3-5 steps with a railing? : Total 6 Click Score: 13    End of Session Equipment Utilized During Treatment: Gait belt Activity Tolerance: Patient limited by pain Patient left: in bed;with call bell/phone within reach;with bed alarm set Nurse Communication: Mobility status PT Visit Diagnosis: Unsteadiness on feet (R26.81);Difficulty in walking, not elsewhere classified (R26.2);Muscle weakness (generalized) (M62.81)     Time: 7078-6754 PT Time Calculation (min) (ACUTE ONLY): 29 min  Charges:  $Therapeutic Exercise: 8-22 mins $Therapeutic Activity: 8-22 mins                     Kreg Shropshire, DPT 10/02/2018, 1:05 PM

## 2018-10-02 NOTE — Plan of Care (Signed)
  Problem: Education: Goal: Knowledge of General Education information will improve Description: Including pain rating scale, medication(s)/side effects and non-pharmacologic comfort measures Outcome: Adequate for Discharge   Problem: Health Behavior/Discharge Planning: Goal: Ability to manage health-related needs will improve Outcome: Adequate for Discharge   Problem: Clinical Measurements: Goal: Ability to maintain clinical measurements within normal limits will improve Outcome: Adequate for Discharge Goal: Will remain free from infection Outcome: Adequate for Discharge Goal: Diagnostic test results will improve Outcome: Adequate for Discharge Goal: Respiratory complications will improve Outcome: Adequate for Discharge   Problem: Activity: Goal: Risk for activity intolerance will decrease Outcome: Adequate for Discharge   Problem: Nutrition: Goal: Adequate nutrition will be maintained Outcome: Adequate for Discharge   Problem: Elimination: Goal: Will not experience complications related to urinary retention Outcome: Adequate for Discharge   Problem: Pain Managment: Goal: General experience of comfort will improve Outcome: Adequate for Discharge   Problem: Safety: Goal: Ability to remain free from injury will improve Outcome: Adequate for Discharge   Problem: Skin Integrity: Goal: Risk for impaired skin integrity will decrease Outcome: Adequate for Discharge

## 2018-10-02 NOTE — Progress Notes (Signed)
OT Cancellation Note  Patient Details Name: Todd Valentine MRN: [id number] DOB: 04-23-1940   Cancelled Treatment:    Reason Eval/Treat Not Completed: Other (comment) Met with pt and dtr at bedside, pt had recently worked with PT and in pain not wanting to participate with OT. He is also awaiting SNF d/c at this time. Introduced OT role and some expectations at [facility]. Will hold today but continue to follow while pt acute.  Zenovia Jarred, MSOT, OTR/L Behavioral Health OT/ Acute Relief OT ascom [phone]   Zenovia Jarred 10/02/2018, 12:31 PM

## 2018-10-02 NOTE — Discharge Summary (Addendum)
[facility] [place] NAME: Todd Valentine    MR#:  [id number]  DATE OF BIRTH:  24-Dec-1940  DATE OF ADMISSION:  09/25/2018 ADMITTING PHYSICIAN: Christel Mormon, MD  DATE OF DISCHARGE: 10/02/2018  PRIMARY CARE PHYSICIAN: [name], MD    ADMISSION DIAGNOSIS:  Unable to ambulate [R26.2] Hamstring tear [S76.319A]  DISCHARGE DIAGNOSIS:  Active Problems:   Pain of right lower extremity due to injury   SECONDARY DIAGNOSIS:   Past Medical History:  Diagnosis Date  . Acute renal failure ([place])   . BPH (benign prostatic hyperplasia)   . Chronic atrial fibrillation   . Chronic diastolic CHF (congestive heart failure) ([name])    a. echo as above  . Chronic insomnia   . Coronary artery disease, non-occlusive 10/06/2013   a. cath 09/2013: pLAD 40, CTO Diag, ramus 40-->80, OM 50 mRCA 40, med rx  . Depression   . Diabetes mellitus with complication ([place])   . History of diverticulitis   . Hyperkalemia   . Hyperlipidemia   . Hypertension   . Malignant melanoma ([place])   . Mitral regurgitation    a. echo 09/2013: EF 55-60%, mildly dilated left atrrium, mild to moderate MR/TR  . Sleep apnea in adult     HOSPITAL COURSE:   78 year old male with past medical history of chronic atrial fibrillation, chronic diastolic CHF, diabetes, hypertension, hyperlipidemia, history of sleep apnea who presented to the hospital acute pain in the right posterior thigh.  1.  Right lower extremity pain-this was secondary to a right proximal hamstring tear with retraction. -Patient underwent an MRI which suggested these findings.  Orthopedics consult was obtained.  They did not recommend any surgical intervention but more supportive care with pain control and physical therapy. - Patient's pain presently is controlled with oral opioids and he is working well with physical therapy.  He is going to be discharged to a skilled nursing facility for ongoing rehab and outpatient  follow-up with orthopedics.  Patient's daughter is in agreement with this plan.  2.  Acute blood loss anemia-secondary to the hamstring tear and mild hematoma on the right lower extremity.  Patient was on Eliquis which was stopped.  Patient's hemoglobin has remained stable and did not require transfusions. -Hemoglobin can be further followed as an outpatient.  Eliquis will remain on hold for now.  3.  Chronic atrial fibrillation-this is currently rate controlled.  Patient will continue his carvedilol and Cardizem. - Continue to hold Eliquis given the right lower extremity hamstring tear with retraction and mild hematoma.  4.  Diabetes type 2 with CKD stage III- blood sugars remained stable while in the hospital.  Patient was maintained on sliding scale insulin. -Patient will resume his glimepiride upon discharge.  5.  Essential hypertension- patient will continue his clonidine, carvedilol, Cardizem. -Patient will continue his losartan.  Blood pressure remained stable while in the hospital.  6.  Chronic diastolic CHF-clinically while in the hospital patient was not in congestive heart failure. -Patient will resume his carvedilol, losartan, Lasix.  7.  Hyperlipidemia-patient will resume his atorvastatin.  8. BPH - no urinary retention  - pt. Will cont. His Flomax.   Stable for discharge to SNF.   DISCHARGE CONDITIONS:   Stable.   CONSULTS OBTAINED:  Treatment Team:  Dereck Leep, MD  DRUG ALLERGIES:   Allergies  Allergen Reactions  . Aspirin Other (See Comments)    Contraindicated - Kidney disease  . Ciprofloxacin Other (See  Comments)    Other reaction(s): Other (See Comments) Pt states he could hardly move Pt states he could hardly move Pt states he could hardly move Other reaction(s): Other (See Comments) Pt states he could hardly move   . Sitagliptin Other (See Comments)    Other reaction(s): Other (See Comments) Weakness Weakness Weakness Other reaction(s):  Other (See Comments) Weakness     DISCHARGE MEDICATIONS:   Allergies as of 10/02/2018      Reactions   Aspirin Other (See Comments)   Contraindicated - Kidney disease   Ciprofloxacin Other (See Comments)   Other reaction(s): Other (See Comments) Pt states he could hardly move Pt states he could hardly move Pt states he could hardly move Other reaction(s): Other (See Comments) Pt states he could hardly move   Sitagliptin Other (See Comments)   Other reaction(s): Other (See Comments) Weakness Weakness Weakness Other reaction(s): Other (See Comments) Weakness      Medication List    STOP taking these medications   apixaban 5 MG Tabs tablet Commonly known as: Eliquis     TAKE these medications   acetaminophen 500 MG tablet Commonly known as: TYLENOL Take 1,000 mg by mouth every 6 (six) hours as needed.   atorvastatin 20 MG tablet Commonly known as: LIPITOR Take 20 mg by mouth daily.   carvedilol 25 MG tablet Commonly known as: COREG Take 1 tablet (25 mg total) by mouth 2 (two) times daily with a meal.   cloNIDine 0.1 MG tablet Commonly known as: CATAPRES TAKE 2 TABLETS BY MOUTH TWICE DAILY   cyclobenzaprine 10 MG tablet Commonly known as: FLEXERIL Take 1 tablet (10 mg total) by mouth 3 (three) times daily as needed for muscle spasms.   diltiazem 120 MG 24 hr capsule Commonly known as: CARDIZEM CD Take 1 capsule by mouth once daily   famotidine 20 MG tablet Commonly known as: PEPCID Take 1 tablet (20 mg total) by mouth at bedtime.   furosemide 20 MG tablet Commonly known as: LASIX Take 20 mg by mouth daily.   glimepiride 4 MG tablet Commonly known as: AMARYL Take 4 mg by mouth 2 (two) times daily.   isosorbide mononitrate 30 MG 24 hr tablet Commonly known as: IMDUR TAKE 1 TABLET BY MOUTH ONCE DAILY   linagliptin 5 MG Tabs tablet Commonly known as: TRADJENTA Take 5 mg by mouth daily.   losartan 25 MG tablet Commonly known as: COZAAR Take 1  tablet (25 mg total) by mouth daily. What changed:   medication strength  how much to take   magnesium oxide 400 (241.3 Mg) MG tablet Commonly known as: MAG-OX Take 1 tablet by mouth daily.   nitroGLYCERIN 0.4 MG SL tablet Commonly known as: NITROSTAT Place 1 tablet (0.4 mg total) every 5 (five) minutes as needed under the tongue for chest pain.   polyethylene glycol 17 g packet Commonly known as: MIRALAX / GLYCOLAX Take 17 g by mouth daily as needed for mild constipation.   Potassium Chloride ER 20 MEQ Tbcr Take 1 tablet by mouth once daily   tamsulosin 0.4 MG Caps capsule Commonly known as: FLOMAX Take 0.4 mg by mouth daily.   traMADol 50 MG tablet Commonly known as: ULTRAM Take 1 tablet (50 mg total) by mouth every 6 (six) hours as needed for severe pain.   traZODone 50 MG tablet Commonly known as: DESYREL Take 50 mg by mouth at bedtime as needed for sleep.         DISCHARGE INSTRUCTIONS:  DIET:  Cardiac diet and Diabetic diet  DISCHARGE CONDITION:  Stable  ACTIVITY:  Activity as tolerated  OXYGEN:  Home Oxygen: No.   Oxygen Delivery: room air  DISCHARGE LOCATION:  nursing home   If you experience worsening of your admission symptoms, develop shortness of breath, life threatening emergency, suicidal or homicidal thoughts you must seek medical attention immediately by calling 911 or calling your MD immediately  if symptoms less severe.  You Must read complete instructions/literature along with all the possible adverse reactions/side effects for all the Medicines you take and that have been prescribed to you. Take any new Medicines after you have completely understood and accpet all the possible adverse reactions/side effects.   Please note  You were cared for by a hospitalist during your hospital stay. If you have any questions about your discharge medications or the care you received while you were in the hospital after you are discharged, you can  call the unit and asked to speak with the hospitalist on call if the hospitalist that took care of you is not available. Once you are discharged, your primary care physician will handle any further medical issues. Please note that NO REFILLS for any discharge medications will be authorized once you are discharged, as it is imperative that you return to your primary care physician (or establish a relationship with a primary care physician if you do not have one) for your aftercare needs so that they can reassess your need for medications and monitor your lab values.     Today   No acute events overnight, patient still complaining of some pain in his right hamstring area.  Seen by physical therapy and the recommend short-term rehab.  Patient's daughter is in agreement to discharge to rehab today.  VITAL SIGNS:  Blood pressure 136/75, pulse 85, temperature 97.8 F (36.6 C), resp. rate 18, height 5' 8.5" (1.74 m), weight 92.1 kg, SpO2 98 %.  I/O:    Intake/Output Summary (Last 24 hours) at 10/02/2018 1330 Last data filed at 10/02/2018 0445 Gross per 24 hour  Intake -  Output 750 ml  Net -750 ml    PHYSICAL EXAMINATION:  GENERAL:  78 y.o.-year-old obese patient lying in the bed with no acute distress.  EYES: Pupils equal, round, reactive to light and accommodation. No scleral icterus. Extraocular muscles intact.  HEENT: Head atraumatic, normocephalic. Dressing on the Nose from recent skin cancer removal.  NECK:  Supple, no jugular venous distention. No thyroid enlargement, no tenderness.  LUNGS: Normal breath sounds bilaterally, no wheezing, rales,rhonchi. No use of accessory muscles of respiration.  CARDIOVASCULAR: S1, S2 normal. No murmurs, rubs, or gallops.  ABDOMEN: Soft, non-tender, non-distended. Bowel sounds present. No organomegaly or mass.  EXTREMITIES: No pedal edema, cyanosis, or clubbing.  NEUROLOGIC: Cranial nerves II through XII are intact. No focal motor or sensory defecits  b/l. Globally weak.  PSYCHIATRIC: The patient is alert and oriented x 3.  SKIN: No obvious rash, lesion, or ulcer.   DATA REVIEW:   CBC Recent Labs  Lab 10/01/18 0833  WBC 9.9  HGB 8.0*  HCT 23.9*  PLT 208    Chemistries  Recent Labs  Lab 09/29/18 0328  NA 136  K 3.8  CL 104  CO2 26  GLUCOSE 190*  BUN 37*  CREATININE 1.55*  CALCIUM 8.5*    Cardiac Enzymes No results for input(s): TROPONINI in the last 168 hours.  Microbiology Results  Results for orders placed or performed during the hospital  encounter of 09/25/18  SARS CORONAVIRUS 2 Nasal Swab Aptima Multi Swab     Status: None   Collection Time: 09/25/18  8:00 PM   Specimen: Aptima Multi Swab; Nasal Swab  Result Value Ref Range Status   SARS Coronavirus 2 NEGATIVE NEGATIVE Final    Comment: (NOTE) SARS-CoV-2 target nucleic acids are NOT DETECTED. The SARS-CoV-2 RNA is generally detectable in upper and lower respiratory specimens during the acute phase of infection. Negative results do not preclude SARS-CoV-2 infection, do not rule out co-infections with other pathogens, and should not be used as the sole basis for treatment or other patient management decisions. Negative results must be combined with clinical observations, patient history, and epidemiological information. The expected result is Negative. Fact Sheet for Patients: SugarRoll.be Fact Sheet for Healthcare Providers: https://www.woods-mathews.com/ This test is not yet approved or cleared by the [place] FDA and  has been authorized for detection and/or diagnosis of SARS-CoV-2 by FDA under an Emergency Use Authorization (EUA). This EUA will remain  in effect (meaning this test can be used) for the duration of the COVID-19 declaration under Section 56 4(b)(1) of the Act, 21 U.S.C. section 360bbb-3(b)(1), unless the authorization is terminated or revoked sooner. Performed at [facility],  [place] [address] [place],  [address]     RADIOLOGY:  No results found.    Management plans discussed with the patient, family and they are in agreement.  CODE STATUS:     Code Status Orders  (From admission, onward)         Start     Ordered   09/26/18 0017  Full code  Continuous     09/26/18 0016       TOTAL TIME TAKING CARE OF THIS PATIENT: 40 minutes.    [name] M.D on 10/02/2018 at 1:30 PM  Between 7am to 6pm - Pager - [phone]  After 6pm go to [email or web address] - Proofreader  [facility]  Office  [phone]  CC: Primary care physician; [name], MD

## 2018-10-02 NOTE — Progress Notes (Signed)
Copy of discharge instructions given to patient's daughter.

## 2018-10-02 NOTE — Progress Notes (Signed)
Report called to [facility]. EMS called. Waiting on transport.

## 2018-10-06 DIAGNOSIS — K5900 Constipation, unspecified: Secondary | ICD-10-CM | POA: Diagnosis not present

## 2018-10-06 DIAGNOSIS — E559 Vitamin D deficiency, unspecified: Secondary | ICD-10-CM | POA: Diagnosis not present

## 2018-10-06 DIAGNOSIS — M62838 Other muscle spasm: Secondary | ICD-10-CM | POA: Diagnosis not present

## 2018-10-06 DIAGNOSIS — R609 Edema, unspecified: Secondary | ICD-10-CM | POA: Diagnosis not present

## 2018-10-06 DIAGNOSIS — G4700 Insomnia, unspecified: Secondary | ICD-10-CM | POA: Diagnosis not present

## 2018-10-06 DIAGNOSIS — I4891 Unspecified atrial fibrillation: Secondary | ICD-10-CM | POA: Diagnosis not present

## 2018-10-06 DIAGNOSIS — E119 Type 2 diabetes mellitus without complications: Secondary | ICD-10-CM | POA: Diagnosis not present

## 2018-10-06 DIAGNOSIS — N401 Enlarged prostate with lower urinary tract symptoms: Secondary | ICD-10-CM | POA: Diagnosis not present

## 2018-10-06 DIAGNOSIS — E785 Hyperlipidemia, unspecified: Secondary | ICD-10-CM | POA: Diagnosis not present

## 2018-10-06 DIAGNOSIS — E876 Hypokalemia: Secondary | ICD-10-CM | POA: Diagnosis not present

## 2018-10-08 DIAGNOSIS — S76391D Other specified injury of muscle, fascia and tendon of the posterior muscle group at thigh level, right thigh, subsequent encounter: Secondary | ICD-10-CM | POA: Diagnosis not present

## 2018-10-08 DIAGNOSIS — I10 Essential (primary) hypertension: Secondary | ICD-10-CM | POA: Diagnosis not present

## 2018-10-08 DIAGNOSIS — Z7409 Other reduced mobility: Secondary | ICD-10-CM | POA: Diagnosis not present

## 2018-10-08 DIAGNOSIS — E118 Type 2 diabetes mellitus with unspecified complications: Secondary | ICD-10-CM | POA: Diagnosis not present

## 2018-10-08 DIAGNOSIS — G8911 Acute pain due to trauma: Secondary | ICD-10-CM | POA: Diagnosis not present

## 2018-10-08 DIAGNOSIS — N400 Enlarged prostate without lower urinary tract symptoms: Secondary | ICD-10-CM | POA: Diagnosis not present

## 2018-10-08 DIAGNOSIS — I4891 Unspecified atrial fibrillation: Secondary | ICD-10-CM | POA: Diagnosis not present

## 2018-10-08 DIAGNOSIS — D649 Anemia, unspecified: Secondary | ICD-10-CM | POA: Diagnosis not present

## 2018-10-08 DIAGNOSIS — E785 Hyperlipidemia, unspecified: Secondary | ICD-10-CM | POA: Diagnosis not present

## 2018-10-08 DIAGNOSIS — M6281 Muscle weakness (generalized): Secondary | ICD-10-CM | POA: Diagnosis not present

## 2018-10-13 DIAGNOSIS — E785 Hyperlipidemia, unspecified: Secondary | ICD-10-CM | POA: Diagnosis not present

## 2018-10-13 DIAGNOSIS — G8911 Acute pain due to trauma: Secondary | ICD-10-CM | POA: Diagnosis not present

## 2018-10-13 DIAGNOSIS — D649 Anemia, unspecified: Secondary | ICD-10-CM | POA: Diagnosis not present

## 2018-10-13 DIAGNOSIS — N400 Enlarged prostate without lower urinary tract symptoms: Secondary | ICD-10-CM | POA: Diagnosis not present

## 2018-10-13 DIAGNOSIS — E118 Type 2 diabetes mellitus with unspecified complications: Secondary | ICD-10-CM | POA: Diagnosis not present

## 2018-10-13 DIAGNOSIS — I10 Essential (primary) hypertension: Secondary | ICD-10-CM | POA: Diagnosis not present

## 2018-10-13 DIAGNOSIS — M6281 Muscle weakness (generalized): Secondary | ICD-10-CM | POA: Diagnosis not present

## 2018-10-13 DIAGNOSIS — I4891 Unspecified atrial fibrillation: Secondary | ICD-10-CM | POA: Diagnosis not present

## 2018-10-13 DIAGNOSIS — S76391D Other specified injury of muscle, fascia and tendon of the posterior muscle group at thigh level, right thigh, subsequent encounter: Secondary | ICD-10-CM | POA: Diagnosis not present

## 2018-10-13 DIAGNOSIS — Z7409 Other reduced mobility: Secondary | ICD-10-CM | POA: Diagnosis not present

## 2018-10-15 DIAGNOSIS — E118 Type 2 diabetes mellitus with unspecified complications: Secondary | ICD-10-CM | POA: Diagnosis not present

## 2018-10-15 DIAGNOSIS — I5032 Chronic diastolic (congestive) heart failure: Secondary | ICD-10-CM | POA: Diagnosis not present

## 2018-10-15 DIAGNOSIS — I10 Essential (primary) hypertension: Secondary | ICD-10-CM | POA: Diagnosis not present

## 2018-10-15 DIAGNOSIS — I4891 Unspecified atrial fibrillation: Secondary | ICD-10-CM | POA: Diagnosis not present

## 2018-10-15 DIAGNOSIS — D649 Anemia, unspecified: Secondary | ICD-10-CM | POA: Diagnosis not present

## 2018-10-15 DIAGNOSIS — E785 Hyperlipidemia, unspecified: Secondary | ICD-10-CM | POA: Diagnosis not present

## 2018-10-15 DIAGNOSIS — M6281 Muscle weakness (generalized): Secondary | ICD-10-CM | POA: Diagnosis not present

## 2018-10-15 DIAGNOSIS — S76391D Other specified injury of muscle, fascia and tendon of the posterior muscle group at thigh level, right thigh, subsequent encounter: Secondary | ICD-10-CM | POA: Diagnosis not present

## 2018-10-15 DIAGNOSIS — N401 Enlarged prostate with lower urinary tract symptoms: Secondary | ICD-10-CM | POA: Diagnosis not present

## 2018-10-15 DIAGNOSIS — G8911 Acute pain due to trauma: Secondary | ICD-10-CM | POA: Diagnosis not present

## 2018-10-21 MED ORDER — EZETIMIBE 10 MG PO TABS: 10.0000 mg | ORAL_TABLET | Freq: Every day | ORAL | 3 refills | Status: DC

## 2018-10-26 DIAGNOSIS — R69 Illness, unspecified: Secondary | ICD-10-CM | POA: Diagnosis not present

## 2018-11-02 ENCOUNTER — Other Ambulatory Visit: Payer: Self-pay | Admitting: Cardiovascular Disease

## 2018-11-02 NOTE — Telephone Encounter (Signed)
Refill request

## 2018-11-02 NOTE — Telephone Encounter (Signed)
Eliquis 5mg  refill request received, pt is 78yrs old, weight-95kg, Crea-1.55 on 09/29/2018, Diagnosis-Afib, and last seen by Dr. Rockey Situ on 09/01/2018. Dose is appropriate based on dosing criteria. Will send in refill to requested pharmacy.

## 2018-11-03 DIAGNOSIS — I4891 Unspecified atrial fibrillation: Secondary | ICD-10-CM | POA: Diagnosis not present

## 2018-11-03 DIAGNOSIS — I10 Essential (primary) hypertension: Secondary | ICD-10-CM | POA: Diagnosis not present

## 2018-11-03 DIAGNOSIS — D631 Anemia in chronic kidney disease: Secondary | ICD-10-CM | POA: Diagnosis not present

## 2018-11-03 DIAGNOSIS — E1165 Type 2 diabetes mellitus with hyperglycemia: Secondary | ICD-10-CM | POA: Diagnosis not present

## 2018-11-03 DIAGNOSIS — N183 Chronic kidney disease, stage 3 (moderate): Secondary | ICD-10-CM | POA: Diagnosis not present

## 2018-11-03 DIAGNOSIS — N2581 Secondary hyperparathyroidism of renal origin: Secondary | ICD-10-CM | POA: Diagnosis not present

## 2018-11-03 DIAGNOSIS — Z23 Encounter for immunization: Secondary | ICD-10-CM | POA: Diagnosis not present

## 2018-11-03 DIAGNOSIS — R739 Hyperglycemia, unspecified: Secondary | ICD-10-CM | POA: Diagnosis not present

## 2018-11-03 DIAGNOSIS — R809 Proteinuria, unspecified: Secondary | ICD-10-CM | POA: Diagnosis not present

## 2018-11-04 DIAGNOSIS — S76311D Strain of muscle, fascia and tendon of the posterior muscle group at thigh level, right thigh, subsequent encounter: Secondary | ICD-10-CM | POA: Diagnosis not present

## 2018-11-16 DIAGNOSIS — R69 Illness, unspecified: Secondary | ICD-10-CM | POA: Diagnosis not present

## 2018-12-25 ENCOUNTER — Other Ambulatory Visit: Payer: Self-pay | Admitting: Cardiovascular Disease

## 2019-01-03 ENCOUNTER — Other Ambulatory Visit: Payer: Self-pay | Admitting: Cardiovascular Disease

## 2019-01-03 DIAGNOSIS — H3562 Retinal hemorrhage, left eye: Secondary | ICD-10-CM | POA: Diagnosis not present

## 2019-01-03 DIAGNOSIS — E119 Type 2 diabetes mellitus without complications: Secondary | ICD-10-CM | POA: Diagnosis not present

## 2019-01-03 DIAGNOSIS — H524 Presbyopia: Secondary | ICD-10-CM | POA: Diagnosis not present

## 2019-01-10 DIAGNOSIS — R69 Illness, unspecified: Secondary | ICD-10-CM | POA: Diagnosis not present

## 2019-01-14 DIAGNOSIS — C44719 Basal cell carcinoma of skin of left lower limb, including hip: Secondary | ICD-10-CM | POA: Diagnosis not present

## 2019-01-14 DIAGNOSIS — Z85828 Personal history of other malignant neoplasm of skin: Secondary | ICD-10-CM | POA: Diagnosis not present

## 2019-01-14 DIAGNOSIS — D2271 Melanocytic nevi of right lower limb, including hip: Secondary | ICD-10-CM | POA: Diagnosis not present

## 2019-01-14 DIAGNOSIS — C44329 Squamous cell carcinoma of skin of other parts of face: Secondary | ICD-10-CM | POA: Diagnosis not present

## 2019-01-14 DIAGNOSIS — R208 Other disturbances of skin sensation: Secondary | ICD-10-CM | POA: Diagnosis not present

## 2019-01-14 DIAGNOSIS — D485 Neoplasm of uncertain behavior of skin: Secondary | ICD-10-CM | POA: Diagnosis not present

## 2019-01-14 DIAGNOSIS — D2262 Melanocytic nevi of left upper limb, including shoulder: Secondary | ICD-10-CM | POA: Diagnosis not present

## 2019-01-14 DIAGNOSIS — X32XXXA Exposure to sunlight, initial encounter: Secondary | ICD-10-CM | POA: Diagnosis not present

## 2019-01-14 DIAGNOSIS — L570 Actinic keratosis: Secondary | ICD-10-CM | POA: Diagnosis not present

## 2019-01-14 DIAGNOSIS — Z85820 Personal history of malignant melanoma of skin: Secondary | ICD-10-CM | POA: Diagnosis not present

## 2019-01-14 DIAGNOSIS — D0472 Carcinoma in situ of skin of left lower limb, including hip: Secondary | ICD-10-CM | POA: Diagnosis not present

## 2019-02-02 DIAGNOSIS — I10 Essential (primary) hypertension: Secondary | ICD-10-CM | POA: Diagnosis not present

## 2019-02-02 DIAGNOSIS — Z87891 Personal history of nicotine dependence: Secondary | ICD-10-CM | POA: Diagnosis not present

## 2019-02-02 DIAGNOSIS — Z7289 Other problems related to lifestyle: Secondary | ICD-10-CM | POA: Diagnosis not present

## 2019-02-02 DIAGNOSIS — R739 Hyperglycemia, unspecified: Secondary | ICD-10-CM | POA: Diagnosis not present

## 2019-02-16 DIAGNOSIS — I10 Essential (primary) hypertension: Secondary | ICD-10-CM | POA: Diagnosis not present

## 2019-02-16 DIAGNOSIS — Z87891 Personal history of nicotine dependence: Secondary | ICD-10-CM | POA: Diagnosis not present

## 2019-02-23 DIAGNOSIS — C44329 Squamous cell carcinoma of skin of other parts of face: Secondary | ICD-10-CM | POA: Diagnosis not present

## 2019-03-07 DIAGNOSIS — I10 Essential (primary) hypertension: Secondary | ICD-10-CM | POA: Diagnosis not present

## 2019-03-07 DIAGNOSIS — E1122 Type 2 diabetes mellitus with diabetic chronic kidney disease: Secondary | ICD-10-CM | POA: Diagnosis not present

## 2019-03-07 DIAGNOSIS — D631 Anemia in chronic kidney disease: Secondary | ICD-10-CM | POA: Diagnosis not present

## 2019-03-07 DIAGNOSIS — N1831 Chronic kidney disease, stage 3a: Secondary | ICD-10-CM | POA: Diagnosis not present

## 2019-03-07 DIAGNOSIS — N2581 Secondary hyperparathyroidism of renal origin: Secondary | ICD-10-CM | POA: Diagnosis not present

## 2019-03-09 DIAGNOSIS — N1832 Chronic kidney disease, stage 3b: Secondary | ICD-10-CM | POA: Diagnosis not present

## 2019-03-09 DIAGNOSIS — I10 Essential (primary) hypertension: Secondary | ICD-10-CM | POA: Diagnosis not present

## 2019-03-15 ENCOUNTER — Telehealth: Payer: Self-pay | Admitting: Cardiovascular Disease

## 2019-03-15 NOTE — Telephone Encounter (Signed)
Spoke with patients daughter per release form and she was following up on mychart messages that were sent last week. Reviewed those with her and she also discussed the various changes on blood pressure medications. Advised that we need some blood pressure readings in order to determine how his blood pressures are running at home. Instructed her to have him check blood pressures 2 hours after medications and to follow Mychart reference instructions for blood pressure checks. Requested that he keep a log of blood pressure readings so we can see how they trend at home. She verbalized understanding of our conversation, agreement with plan, and had no further questions at this time.

## 2019-03-15 NOTE — Patient Instructions (Signed)
How to Take Your Blood Pressure You can take your blood pressure at home with a machine. You may need to check your blood pressure at home:  To check if you have high blood pressure (hypertension).  To check your blood pressure over time.  To make sure your blood pressure medicine is working. Supplies needed: You will need a blood pressure machine, or monitor. You can buy one at a drugstore or online. When choosing one:  Choose one with an arm cuff.  Choose one that wraps around your upper arm. Only one finger should fit between your arm and the cuff.  Do not choose one that measures your blood pressure from your wrist or finger. Your doctor can suggest a monitor. How to prepare Avoid these things for 30 minutes before checking your blood pressure:  Drinking caffeine.  Drinking alcohol.  Eating.  Smoking.  Exercising. Five minutes before checking your blood pressure:  Pee.  Sit in a dining chair. Avoid sitting in a soft couch or armchair.  Be quiet. Do not talk. How to take your blood pressure Follow the instructions that came with your machine. If you have a digital blood pressure monitor, these may be the instructions: 1. Sit up straight. 2. Place your feet on the floor. Do not cross your ankles or legs. 3. Rest your left arm at the level of your heart. You may rest it on a table, desk, or chair. 4. Pull up your shirt sleeve. 5. Wrap the blood pressure cuff around the upper part of your left arm. The cuff should be 1 inch (2.5 cm) above your elbow. It is best to wrap the cuff around bare skin. 6. Fit the cuff snugly around your arm. You should be able to place only one finger between the cuff and your arm. 7. Put the cord inside the groove of your elbow. 8. Press the power button. 9. Sit quietly while the cuff fills with air and loses air. 10. Write down the numbers on the screen. 11. Wait 2-3 minutes and then repeat steps 1-10. What do the numbers mean? Two  numbers make up your blood pressure. The first number is called systolic pressure. The second is called diastolic pressure. An example of a blood pressure reading is "120 over 80" (or 120/80). If you are an adult and do not have a medical condition, use this guide to find out if your blood pressure is normal: Normal  First number: below 120.  Second number: below 80. Elevated  First number: 120-129.  Second number: below 80. Hypertension stage 1  First number: 130-139.  Second number: 80-89. Hypertension stage 2  First number: 140 or above.  Second number: 90 or above. Your blood pressure is above normal even if only the top or bottom number is above normal. Follow these instructions at home:  Check your blood pressure as often as your doctor tells you to.  Take your monitor to your next doctor's appointment. Your doctor will: ? Make sure you are using it correctly. ? Make sure it is working right.  Make sure you understand what your blood pressure numbers should be.  Tell your doctor if your medicines are causing side effects. Contact a doctor if:  Your blood pressure keeps being high. Get help right away if:  Your first blood pressure number is higher than 180.  Your second blood pressure number is higher than 120. This information is not intended to replace advice given to you by your health   care provider. Make sure you discuss any questions you have with your health care provider. Document Revised: 01/23/2017 Document Reviewed: 07/20/2015 Elsevier Patient Education  2020 [employer].

## 2019-03-15 NOTE — Telephone Encounter (Signed)
Patient daughter states she sent a mychart message last week and would like to know the status of her message being addresses. Please call to discuss.

## 2019-03-17 DIAGNOSIS — I10 Essential (primary) hypertension: Secondary | ICD-10-CM | POA: Diagnosis not present

## 2019-03-24 DIAGNOSIS — E119 Type 2 diabetes mellitus without complications: Secondary | ICD-10-CM | POA: Diagnosis not present

## 2019-03-24 DIAGNOSIS — Z79899 Other long term (current) drug therapy: Secondary | ICD-10-CM | POA: Diagnosis not present

## 2019-03-24 DIAGNOSIS — I10 Essential (primary) hypertension: Secondary | ICD-10-CM | POA: Diagnosis not present

## 2019-03-24 DIAGNOSIS — I480 Paroxysmal atrial fibrillation: Secondary | ICD-10-CM | POA: Diagnosis not present

## 2019-03-24 DIAGNOSIS — I2510 Atherosclerotic heart disease of native coronary artery without angina pectoris: Secondary | ICD-10-CM | POA: Diagnosis not present

## 2019-03-24 DIAGNOSIS — Z87891 Personal history of nicotine dependence: Secondary | ICD-10-CM | POA: Diagnosis not present

## 2019-04-21 ENCOUNTER — Other Ambulatory Visit: Payer: Self-pay | Admitting: Cardiovascular Disease

## 2019-04-22 NOTE — Telephone Encounter (Signed)
Prescription refill request for Eliquis received.  Last office visit: [place], 09/01/2018 Scr: 1.55, 09/29/2018 Age: 79 y.o.  Weight: 92.1kg   Prescription refill sent.

## 2019-04-22 NOTE — Telephone Encounter (Signed)
Please review for refill on Eliquis  

## 2019-05-11 DIAGNOSIS — R739 Hyperglycemia, unspecified: Secondary | ICD-10-CM | POA: Diagnosis not present

## 2019-05-11 DIAGNOSIS — I10 Essential (primary) hypertension: Secondary | ICD-10-CM | POA: Diagnosis not present

## 2019-05-11 DIAGNOSIS — Z87891 Personal history of nicotine dependence: Secondary | ICD-10-CM | POA: Diagnosis not present

## 2019-05-11 DIAGNOSIS — L853 Xerosis cutis: Secondary | ICD-10-CM | POA: Diagnosis not present

## 2019-05-31 ENCOUNTER — Telehealth: Payer: Self-pay | Admitting: Cardiovascular Disease

## 2019-05-31 NOTE — Telephone Encounter (Signed)
Pt c/o medication issue:  1. Name of Medication: Losartan and spironolactone   2. How are you currently taking this medication (dosage and times per day)? See below  3. Are you having a reaction (difficulty breathing--STAT)? Feels over medicated   4. What is your medication issue? Patient was having fluctuating bp ( recent ov 098 systolic) .  PCP increased losartan to 50 then to 100 mg po q d and added spironolactone 25 mg q d then decreased to 12.5 mg po q d .  Patient reported not feeling well after starting spironolactone 25 mg  so he stopped taking it.  Then pcp suggested restarting at 12.5 mg po q d.  The patient tried this but did not feel well so he stopped taking this as well.     Daughter is concerned about his medications and the way he is feeling.  Please advise .  Scheduled 4/9 [place]

## 2019-05-31 NOTE — Telephone Encounter (Signed)
Left voicemail message to call back  

## 2019-06-02 NOTE — Telephone Encounter (Signed)
Left voicemail message to call back. He does have appointment tomorrow with APP so if he doesn't call back will close this encounter.

## 2019-06-03 ENCOUNTER — Ambulatory Visit: Payer: Medicare HMO | Admitting: Family

## 2019-06-03 NOTE — Progress Notes (Deleted)
Office Visit    Patient Name: Todd Valentine Date of Encounter: 06/03/2019  Primary Care Provider:  [name], MD Primary Cardiologist:  No primary care provider on file. Electrophysiologist:  None   Chief Complaint    Todd Valentine is a 79 y.o. male with a hx of chronic diastolic heart failure, chronic atrial fibrillation, HTN, DM2, CAD, OSA previous tobacco abuse, EtOH use presents today for medication management follow-up of diastolic heart failure and hypertension.  Past Medical History    Past Medical History:  Diagnosis Date  . Acute renal failure ([place])   . BPH (benign prostatic hyperplasia)   . Chronic atrial fibrillation   . Chronic diastolic CHF (congestive heart failure) ([name])    a. echo as above  . Chronic insomnia   . Coronary artery disease, non-occlusive 10/06/2013   a. cath 09/2013: pLAD 40, CTO Diag, ramus 40-->80, OM 50 mRCA 40, med rx  . Depression   . Diabetes mellitus with complication ([place])   . History of diverticulitis   . Hyperkalemia   . Hyperlipidemia   . Hypertension   . Malignant melanoma ([place])   . Mitral regurgitation    a. echo 09/2013: EF 55-60%, mildly dilated left atrrium, mild to moderate MR/TR  . Sleep apnea in adult    Past Surgical History:  Procedure Laterality Date  . APPENDECTOMY    . CARDIAC CATHETERIZATION  10/06/2013  . COLONOSCOPY    . COLONOSCOPY WITH PROPOFOL N/A 11/23/2014   Procedure: COLONOSCOPY WITH PROPOFOL;  Surgeon: Manya Silvas, MD;  Location[facility] ENDOSCOPY;  Service: Endoscopy;  Laterality: N/A;  . MOHS SURGERY    . removal tubular adenoma      Allergies  Allergies  Allergen Reactions  . Aspirin Other (See Comments)    Contraindicated - Kidney disease  . Ciprofloxacin Other (See Comments)    Other reaction(s): Pt states he could hardly move  . Sitagliptin Other (See Comments)    Other reaction(s): Weakness     History of Present Illness    Todd Valentine is a 79 y.o. male with a hx of  chronic diastolic heart failure, chronic atrial fibrillation, HTN, DM2, CAD, OSA, CKD, previous tobacco abuse, EtOH use last seen by Dr. Arta Bruce 08/2018.  Noted to have significant coronary artery disease by catheterization August 2015.  Catheterization 09/2013 showed an occluded diagonal vessel, 40% proximal LAD disease, bifurcating marginal branch/ramus.  Ramus had 60% followed by 80% disease, OM with 50% mid disease released, RCA with 40% mid disease.  Medical management was recommended.    Echocardiogram 04/2016 with mild LVH, EF 50-55%, LV size mildly reduced, mild MR, mildly dilated LA, RV mildly dilated with mildly reduced RF systolic function. Of note he was in atrial fib with RVR at the time making the study difficult.   He called the office yesterday with concerns about his losartan and spironolactone.  He "feels overmedicated".  It is having fluctuating blood pressures and his primary care increase his losartan from 50 mg to 100 mg.  Then added spironolactone 25 mg daily.  The patient did not feel well, reported dizziness, so he stopped.  Primary care suggested restarting 12.5 mg.  Tells me he still felt poorly.  ***  EKGs/Labs/Other Studies Reviewed:   The following studies were reviewed today: ***  EKG:  EKG is ordered today.  The ekg ordered today demonstrates ***  Recent Labs: 09/29/2018: BUN 37; Creatinine, Ser 1.55; Potassium 3.8; Sodium 136 10/01/2018: Hemoglobin 8.0;  Platelets 208  Recent Lipid Panel    Component Value Date/Time   CHOL 97 (L) 08/25/2017 0841   CHOL 169 10/03/2013 0359   TRIG 128 08/25/2017 0841   TRIG 187 10/03/2013 0359   HDL 30 (L) 08/25/2017 0841   HDL 28 (L) 10/03/2013 0359   CHOLHDL 3.2 08/25/2017 0841   VLDL 37 10/03/2013 0359   LDLCALC 41 08/25/2017 0841   LDLCALC 104 (H) 10/03/2013 0359    Home Medications   No outpatient medications have been marked as taking for the 06/03/19 encounter (Appointment) with Loel Dubonnet, NP.      Review of  Systems    ***   ROS All other systems reviewed and are otherwise negative except as noted above.  Physical Exam    VS:  There were no vitals taken for this visit. , BMI There is no height or weight on file to calculate BMI. GEN: Well nourished, well developed, in no acute distress. HEENT: normal. Neck: Supple, no JVD, carotid bruits, or masses. Cardiac: ***RRR, no murmurs, rubs, or gallops. No clubbing, cyanosis, edema.  ***Radials/DP/PT 2+ and equal bilaterally.  Respiratory:  ***Respirations regular and unlabored, clear to auscultation bilaterally. GI: Soft, nontender, nondistended, BS + x 4. MS: No deformity or atrophy. Skin: Warm and dry, no rash. Neuro:  Strength and sensation are intact. Psych: Normal affect.  Accessory Clinical Findings    ECG personally reviewed by me today - *** - no acute changes.  Assessment & Plan    1. Chronic atrial fibrillation/chronic anticoagulation -  2. HTN -  3. Chronic diastolic heart failure -  4. HLD - Lipid profile 07/2018 LDL 57, at goal of <70.  5. DM2 - 05/11/19 A1c 7.1.  6. CKD - Follows with Dr. Holley Raring of nephrology.   Disposition: Follow up {follow up:15908} with Dr. Rockey Situ.   Loel Dubonnet, NP 06/03/2019, 7:46 AM

## 2019-06-04 NOTE — Progress Notes (Signed)
Cardiology Office Note  Date:  06/06/2019   ID:  Todd Valentine, DOB January 04, 1941, MRN [id number]  PCP:  [name], MD   Chief Complaint  Patient presents with  . other    Follow up due to medication isssues. "Doing well." Pt. c/o LE edema, leg weakness and tired more than usual.     HPI:  Todd Valentine is a very pleasant 79 year old gentleman with  long history of smoking for 30-40 years,  alcohol use,  obesity,  chronic diastolic CHF,Echo March 8315 with ejection fraction 50-55% chronic atrial fibrillation,  hypertension,  type 2 diabetes,  Significant coronary disease by catheterization August 2015 three-vessel  sleep apnea, uses his CPAP periodically presenting for follow-up of his atrial fibrillation Lives alone with supportive family after wife unexpectedly passed away  In follow-up today reports he is feeling tired at times Works 3 days a week, 9 hour Delivering auto parts Likes the work, does not see himself sitting around all day  Reports drops in his blood pressure late morning also late at night Has orthostasis symptoms Does not feel well when his pressure drops  Brings a notebook with him today of all of his blood pressure measurements, sometimes 176 systolic Has not been checking orthostatics at home Prior issues with being overmedicated  Reports he was started on spironolactone, did not tolerate it well, had general malaise, dizziness Has been on and off spironolactone several times, currently not taking spironolactone  In the morning takes carvedilol 25 mg daily diltiazem 120 mg daily Lasix 20 clonidine 0.2  At dinner takes carvedilol 25, clonidine 0.2 tamsulosin losartan 100 Imdur 30  Labs: Creatinine 1.32 BUN 20 March 07, 2019 Creatinine 1.8 BUN 36 March 24, 2019  EKG personally reviewed by myself on todays visit Atrial fibrillation right bundle branch block rate 60-70  Other past medical history reviewed Hospital admission 05/08/16 for  PNA,sepsis, atrial fib with RVR On bipap For many days, ABX. Declined ventilator if needed acute kidney injury/ATN,  requiring CRRT Diltiazem dose increased at d/c up to 240 daily D/c on pradaxa (was going to change to eliquis) Off lisinopril/HCTZ, metformin  Labs at d/c  CR 1.64, BUN 33  total cholesterol well controlled, 130 or less  hospital admission 10/02/2013 with discharge August 12. He presented with shortness of breath, Atrial fibrillation with RVR. Echocardiogram showed normal ejection fraction. Symptoms improved with diuresis.  Echocardiogram 10/02/2013 showing ejection fraction 55-60%, mildly dilated left atrium, mild to moderate MR and TR  He had cardiac catheterization 10/06/2013 showing occluded diagonal vessel, 40% proximal LAD disease, bifurcating marginal branch/ramus. Ramus had 60% followed by 80% disease, OM with 50% mid disease at least. RCA with 40% mid disease. Medical management recommended   PMH:   has a past medical history of Acute renal failure ([place]), BPH (benign prostatic hyperplasia), Chronic atrial fibrillation ([name]), Chronic diastolic CHF (congestive heart failure) ([place]), Chronic insomnia, Coronary artery disease, non-occlusive (10/06/2013), Depression, Diabetes mellitus with complication ([place]), History of diverticulitis, Hyperkalemia, Hyperlipidemia, Hypertension, Malignant melanoma ([place]), Mitral regurgitation, and Sleep apnea in adult.  PSH:    Past Surgical History:  Procedure Laterality Date  . APPENDECTOMY    . CARDIAC CATHETERIZATION  10/06/2013  . COLONOSCOPY    . COLONOSCOPY WITH PROPOFOL N/A 11/23/2014   Procedure: COLONOSCOPY WITH PROPOFOL;  Surgeon: Manya Silvas, MD;  Location: [facility] ENDOSCOPY;  Service: Endoscopy;  Laterality: N/A;  . MOHS SURGERY    . removal tubular adenoma      Current Outpatient  Medications on File Prior to Visit  Medication Sig Dispense Refill  . acetaminophen (TYLENOL) 500 MG tablet Take 1,000 mg by mouth  every 6 (six) hours as needed for mild pain, fever or headache.     [name] CARTIA XT 120 MG 24 hr capsule Take 1 capsule by mouth once daily 90 capsule 3  . carvedilol (COREG) 25 MG tablet Take 1 tablet (25 mg total) by mouth 2 (two) times daily with a meal.    . cloNIDine (CATAPRES) 0.1 MG tablet Take 2 tablets by mouth twice daily 360 tablet 1  . ELIQUIS 5 MG TABS tablet Take 1 tablet by mouth twice daily 60 tablet 5  . ezetimibe (ZETIA) 10 MG tablet Take 1 tablet (10 mg total) by mouth daily. 90 tablet 3  . famotidine (PEPCID) 20 MG tablet Take 1 tablet (20 mg total) by mouth at bedtime.    . furosemide (LASIX) 20 MG tablet Take 20 mg by mouth daily.    [name] glimepiride (AMARYL) 4 MG tablet Take 4 mg by mouth 2 (two) times daily.     . isosorbide mononitrate (IMDUR) 30 MG 24 hr tablet Take 1 tablet by mouth once daily 90 tablet 3  . linagliptin (TRADJENTA) 5 MG TABS tablet Take 5 mg by mouth daily.    [name] losartan (COZAAR) 100 MG tablet Take 100 mg by mouth daily.     . magnesium oxide (MAG-OX) 400 (241.3 Mg) MG tablet Take 1 tablet by mouth daily.    . nitroGLYCERIN (NITROSTAT) 0.4 MG SL tablet Place 1 tablet (0.4 mg total) every 5 (five) minutes as needed under the tongue for chest pain. 25 tablet 3  . polyethylene glycol (MIRALAX / GLYCOLAX) 17 g packet Take 17 g by mouth daily as needed for mild constipation. 14 each 0  . Potassium Chloride ER 20 MEQ TBCR Take 1 tablet by mouth once daily 90 tablet 3  . tamsulosin (FLOMAX) 0.4 MG CAPS capsule Take 0.4 mg by mouth daily.     . traZODone (DESYREL) 50 MG tablet Take 50 mg by mouth at bedtime as needed for sleep.    . cyclobenzaprine (FLEXERIL) 10 MG tablet Take 1 tablet (10 mg total) by mouth 3 (three) times daily as needed for muscle spasms. 30 tablet 0  . traMADol (ULTRAM) 50 MG tablet Take 1 tablet (50 mg total) by mouth every 6 (six) hours as needed for severe pain. (Patient not taking: Reported on 06/06/2019) 30 tablet 0   No current  facility-administered medications on file prior to visit.    Allergies:   Hydrocodone-acetaminophen, Aspirin, Ciprofloxacin, and Sitagliptin   Social History:  The patient  reports that he has quit smoking. He has never used smokeless tobacco. He reports current alcohol use. He reports that he does not use drugs.   Family History:   Family history is unknown by patient.    Review of Systems: Review of Systems  Constitutional: Negative.   Respiratory: Negative.   Cardiovascular: Positive for leg swelling.  Gastrointestinal: Negative.   Musculoskeletal: Negative.        Gait instability  Neurological: Negative.   Psychiatric/Behavioral: Negative.   All other systems reviewed and are negative.   PHYSICAL EXAM: VS:  BP (!) 160/70 (BP Location: Left Arm, Patient Position: Sitting, Cuff Size: Normal)   Pulse 62   Ht 5\' 9"  (1.753 m)   Wt 218 lb 4 oz (99 kg)   SpO2 98%   BMI 32.23 kg/m  , BMI  Body mass index is 32.23 kg/m. Constitutional:  oriented to person, place, and time. No distress.  HENT:  Head: Grossly normal Eyes:  no discharge. No scleral icterus.  Neck: No JVD, no carotid bruits  Cardiovascular: Irregularly irregular no murmurs appreciated Trace to 1+ lower extremity edema bilaterally Pulmonary/Chest: Clear to auscultation bilaterally, no wheezes or rails Abdominal: Soft.  no distension.  no tenderness.  Musculoskeletal: Normal range of motion Neurological:  normal muscle tone. Coordination normal. No atrophy Skin: Skin warm and dry Psychiatric: normal affect, pleasant   Recent Labs: 09/29/2018: BUN 37; Creatinine, Ser 1.55; Potassium 3.8; Sodium 136 10/01/2018: Hemoglobin 8.0; Platelets 208    Lipid Panel Lab Results  Component Value Date   CHOL 97 (L) 08/25/2017   HDL 30 (L) 08/25/2017   LDLCALC 41 08/25/2017   TRIG 128 08/25/2017      Wt Readings from Last 3 Encounters:  06/06/19 218 lb 4 oz (99 kg)  09/25/18 203 lb (92.1 kg)  09/26/18 203 lb (92.1  kg)      ASSESSMENT AND PLAN:   Chronic atrial fibrillation ([name]) - Plan: EKG 12-Lead Rate controlled, on eliquis Continue diltiazem though this can contribute to lower extremity swelling, discussed with him on today's visit  Essential hypertension Numerous medications on his list, side effects of each discussed with him Discussed fatigue on clonidine, leg swelling on diltiazem, He is having drops in pressure late morning and late in the evening With symptoms of orthostasis --Recommend he hold the isosorbide at night, Given the climbing creatinine and BUN on lab work in January would hold the Lasix on weekends --Community call us with some blood pressure measurements over the next week or 2  chronic diastolic CHF (congestive heart failure) ([place]) - Climbing creatinine January 2021 Concern for prerenal state We will hold the Lasix on weekends  Mixed hyperlipidemia  Cholesterol is at goal on the current lipid regimen. No changes to the medications were made.  Type 2 diabetes mellitus with complication, without long-term current use of insulin ([place]) - Plan: EKG 12-Lead We have encouraged continued exercise, careful diet management in an effort to lose weight.  Acute on chronic renal failure Creatinine and BUN higher, will hold Lasix on weekends Follow-up with nephrology  Dr. Holley Raring on losartan   Total encounter time more than 25 minutes  Greater than 50% was spent in counseling and coordination of care with the patient   Disposition:   F/U  6 months   Orders Placed This Encounter  Procedures  . EKG 12-Lead     Signed, Esmond Plants, M.D., Ph.D. 06/06/2019  [place], [place]

## 2019-06-06 ENCOUNTER — Ambulatory Visit (INDEPENDENT_AMBULATORY_CARE_PROVIDER_SITE_OTHER): Payer: Medicare Other | Admitting: Cardiovascular Disease

## 2019-06-06 ENCOUNTER — Encounter: Payer: Self-pay | Admitting: Cardiovascular Disease

## 2019-06-06 ENCOUNTER — Other Ambulatory Visit: Payer: Self-pay

## 2019-06-06 VITALS — BP 160/70 | HR 62 | Ht 69.0 in | Wt 218.2 lb

## 2019-06-06 DIAGNOSIS — I5032 Chronic diastolic (congestive) heart failure: Secondary | ICD-10-CM | POA: Diagnosis not present

## 2019-06-06 DIAGNOSIS — E118 Type 2 diabetes mellitus with unspecified complications: Secondary | ICD-10-CM | POA: Diagnosis not present

## 2019-06-06 DIAGNOSIS — E782 Mixed hyperlipidemia: Secondary | ICD-10-CM

## 2019-06-06 DIAGNOSIS — R0602 Shortness of breath: Secondary | ICD-10-CM | POA: Diagnosis not present

## 2019-06-06 DIAGNOSIS — R600 Localized edema: Secondary | ICD-10-CM | POA: Diagnosis not present

## 2019-06-06 DIAGNOSIS — I482 Chronic atrial fibrillation, unspecified: Secondary | ICD-10-CM

## 2019-06-06 DIAGNOSIS — I25118 Atherosclerotic heart disease of native coronary artery with other forms of angina pectoris: Secondary | ICD-10-CM

## 2019-06-06 DIAGNOSIS — I10 Essential (primary) hypertension: Secondary | ICD-10-CM

## 2019-06-06 NOTE — Telephone Encounter (Signed)
Spoke with daughter per release form and patient has appointment later today. She is coming with him due to multiple changes and some of her concerns regarding medications. Advised I would update note to reflect visitor coming with him today to see provider. She was appreciative for the call with no further questions at this time.

## 2019-06-06 NOTE — Patient Instructions (Addendum)
Medication Instructions:  Hold the lasix on weekends Hold the imdur, isosorbide  If you continue to have dizzy spells when standing up, Call the office    If you need a refill on your cardiac medications before your next appointment, please call your pharmacy.    Lab work: No new labs needed   If you have labs (blood work) drawn today and your tests are completely normal, you will receive your results only by: [name] MyChart Message (if you have MyChart) OR . A paper copy in the mail If you have any lab test that is abnormal or we need to change your treatment, we will call you to review the results.   Testing/Procedures: No new testing needed   Follow-Up: At [facility], you and your health needs are our priority.  As part of our continuing mission to provide you with exceptional heart care, we have created designated Provider Care Teams.  These Care Teams include your primary Cardiologist (physician) and Advanced Practice Providers (APPs -  Physician Assistants and Nurse Practitioners) who all work together to provide you with the care you need, when you need it.  . You will need a follow up appointment in 6 months   . Providers on your designated Care Team:   . Murray Hodgkins, NP . Christell Faith, PA-C . Marrianne Mood, PA-C  Any Other Special Instructions Will Be Listed Below (If Applicable).

## 2019-06-23 ENCOUNTER — Other Ambulatory Visit: Payer: Self-pay | Admitting: Cardiovascular Disease

## 2019-06-30 DIAGNOSIS — D2261 Melanocytic nevi of right upper limb, including shoulder: Secondary | ICD-10-CM | POA: Diagnosis not present

## 2019-06-30 DIAGNOSIS — D2262 Melanocytic nevi of left upper limb, including shoulder: Secondary | ICD-10-CM | POA: Diagnosis not present

## 2019-06-30 DIAGNOSIS — D225 Melanocytic nevi of trunk: Secondary | ICD-10-CM | POA: Diagnosis not present

## 2019-06-30 DIAGNOSIS — Z85820 Personal history of malignant melanoma of skin: Secondary | ICD-10-CM | POA: Diagnosis not present

## 2019-06-30 DIAGNOSIS — X32XXXA Exposure to sunlight, initial encounter: Secondary | ICD-10-CM | POA: Diagnosis not present

## 2019-06-30 DIAGNOSIS — L570 Actinic keratosis: Secondary | ICD-10-CM | POA: Diagnosis not present

## 2019-06-30 DIAGNOSIS — D2271 Melanocytic nevi of right lower limb, including hip: Secondary | ICD-10-CM | POA: Diagnosis not present

## 2019-06-30 DIAGNOSIS — Z85828 Personal history of other malignant neoplasm of skin: Secondary | ICD-10-CM | POA: Diagnosis not present

## 2019-06-30 DIAGNOSIS — D485 Neoplasm of uncertain behavior of skin: Secondary | ICD-10-CM | POA: Diagnosis not present

## 2019-08-08 DIAGNOSIS — D631 Anemia in chronic kidney disease: Secondary | ICD-10-CM | POA: Diagnosis not present

## 2019-08-08 DIAGNOSIS — I10 Essential (primary) hypertension: Secondary | ICD-10-CM | POA: Diagnosis not present

## 2019-08-08 DIAGNOSIS — E1122 Type 2 diabetes mellitus with diabetic chronic kidney disease: Secondary | ICD-10-CM | POA: Diagnosis not present

## 2019-08-08 DIAGNOSIS — R809 Proteinuria, unspecified: Secondary | ICD-10-CM | POA: Diagnosis not present

## 2019-08-08 DIAGNOSIS — N2581 Secondary hyperparathyroidism of renal origin: Secondary | ICD-10-CM | POA: Diagnosis not present

## 2019-08-08 DIAGNOSIS — N1831 Chronic kidney disease, stage 3a: Secondary | ICD-10-CM | POA: Diagnosis not present

## 2019-10-25 ENCOUNTER — Other Ambulatory Visit: Payer: Self-pay | Admitting: Cardiovascular Disease

## 2019-10-25 NOTE — Telephone Encounter (Signed)
Pt last saw Dr Rockey Situ 06/06/19, last labs 03/24/19 Creat 1.8 at [facility] per care everywhere, age 79, weight 99kg, based on specified criteria pt is on appropriate dosage of Eliquis 5mg  BID.  Will refill rx.

## 2019-11-16 DIAGNOSIS — Z0000 Encounter for general adult medical examination without abnormal findings: Secondary | ICD-10-CM | POA: Diagnosis not present

## 2019-11-16 DIAGNOSIS — Z86010 Personal history of colonic polyps: Secondary | ICD-10-CM | POA: Diagnosis not present

## 2019-11-16 DIAGNOSIS — Z23 Encounter for immunization: Secondary | ICD-10-CM | POA: Diagnosis not present

## 2019-11-16 DIAGNOSIS — I4891 Unspecified atrial fibrillation: Secondary | ICD-10-CM | POA: Diagnosis not present

## 2019-11-16 DIAGNOSIS — I129 Hypertensive chronic kidney disease with stage 1 through stage 4 chronic kidney disease, or unspecified chronic kidney disease: Secondary | ICD-10-CM | POA: Diagnosis not present

## 2019-11-16 DIAGNOSIS — R001 Bradycardia, unspecified: Secondary | ICD-10-CM | POA: Diagnosis not present

## 2019-11-16 DIAGNOSIS — Z1211 Encounter for screening for malignant neoplasm of colon: Secondary | ICD-10-CM | POA: Diagnosis not present

## 2019-11-16 DIAGNOSIS — E7800 Pure hypercholesterolemia, unspecified: Secondary | ICD-10-CM | POA: Diagnosis not present

## 2019-11-16 DIAGNOSIS — I10 Essential (primary) hypertension: Secondary | ICD-10-CM | POA: Diagnosis not present

## 2019-11-16 DIAGNOSIS — N1832 Chronic kidney disease, stage 3b: Secondary | ICD-10-CM | POA: Diagnosis not present

## 2019-11-16 DIAGNOSIS — E1122 Type 2 diabetes mellitus with diabetic chronic kidney disease: Secondary | ICD-10-CM | POA: Diagnosis not present

## 2019-11-24 ENCOUNTER — Other Ambulatory Visit: Payer: Self-pay

## 2019-11-24 ENCOUNTER — Encounter: Payer: Self-pay | Admitting: Family

## 2019-11-24 ENCOUNTER — Ambulatory Visit (INDEPENDENT_AMBULATORY_CARE_PROVIDER_SITE_OTHER): Payer: Medicare Other | Admitting: Family

## 2019-11-24 VITALS — BP 136/82 | HR 61 | Ht 69.0 in | Wt 214.0 lb

## 2019-11-24 DIAGNOSIS — I10 Essential (primary) hypertension: Secondary | ICD-10-CM

## 2019-11-24 DIAGNOSIS — I25118 Atherosclerotic heart disease of native coronary artery with other forms of angina pectoris: Secondary | ICD-10-CM

## 2019-11-24 DIAGNOSIS — I482 Chronic atrial fibrillation, unspecified: Secondary | ICD-10-CM

## 2019-11-24 DIAGNOSIS — E118 Type 2 diabetes mellitus with unspecified complications: Secondary | ICD-10-CM | POA: Diagnosis not present

## 2019-11-24 DIAGNOSIS — Z23 Encounter for immunization: Secondary | ICD-10-CM | POA: Diagnosis not present

## 2019-11-24 DIAGNOSIS — E782 Mixed hyperlipidemia: Secondary | ICD-10-CM | POA: Diagnosis not present

## 2019-11-24 DIAGNOSIS — Z7901 Long term (current) use of anticoagulants: Secondary | ICD-10-CM | POA: Diagnosis not present

## 2019-11-24 MED ORDER — CARVEDILOL 12.5 MG PO TABS: 12.5000 mg | ORAL_TABLET | Freq: Two times a day (BID) | ORAL | 1 refills | Status: DC

## 2019-11-24 MED ORDER — EZETIMIBE 10 MG PO TABS: 10.0000 mg | ORAL_TABLET | Freq: Every day | ORAL | 3 refills | Status: DC

## 2019-11-24 NOTE — Progress Notes (Signed)
Office Visit    Patient Name: Todd Valentine Date of Encounter: 11/24/2019  Primary Care Provider:  Shelda Jakes, MD Primary Cardiologist:  Ida Rogue, MD Electrophysiologist:  None   Chief Complaint    Todd Valentine is a 79 y.o. male with a hx of tobacco use, alcohol use, obesity, chronic diastolic heart failure, chronic atrial fibrillation, HTN, RBBB, CKD, DM 2, CAD, OSA presents today for bradycardia  Past Medical History    Past Medical History:  Diagnosis Date  . Acute renal failure ([place])   . BPH (benign prostatic hyperplasia)   . Chronic atrial fibrillation ([place])   . Chronic diastolic CHF (congestive heart failure) ([name])    a. echo as above  . Chronic insomnia   . Coronary artery disease, non-occlusive 10/06/2013   a. cath 09/2013: pLAD 40, CTO Diag, ramus 40-->80, OM 50 mRCA 40, med rx  . Depression   . Diabetes mellitus with complication ([place])   . History of diverticulitis   . Hyperkalemia   . Hyperlipidemia   . Hypertension   . Malignant melanoma ([place])   . Mitral regurgitation    a. echo 09/2013: EF 55-60%, mildly dilated left atrrium, mild to moderate MR/TR  . Sleep apnea in adult    Past Surgical History:  Procedure Laterality Date  . APPENDECTOMY    . CARDIAC CATHETERIZATION  10/06/2013  . COLONOSCOPY    . COLONOSCOPY WITH PROPOFOL N/A 11/23/2014   Procedure: COLONOSCOPY WITH PROPOFOL;  Surgeon: Manya Silvas, MD;  Location: [facility] ENDOSCOPY;  Service: Endoscopy;  Laterality: N/A;  . MOHS SURGERY    . removal tubular adenoma      Allergies  Allergies  Allergen Reactions  . Hydrocodone-Acetaminophen Other (See Comments)  . Aspirin Other (See Comments)    Contraindicated - Kidney disease Other reaction(s): Other (See Comments), Other (See Comments) Contraindicated - Kidney disease Contraindicated - Kidney disease  . Ciprofloxacin Other (See Comments)    Other reaction(s): Pt states he could hardly move Other reaction(s): Other (See  Comments), Other (See Comments) Pt states he could hardly move Other reaction(s): Other (See Comments) Pt states he could hardly move Other reaction(s): Pt states he could hardly move Pt states he could hardly move  . Sitagliptin Other (See Comments)    Other reaction(s): Weakness  Other reaction(s): Other (See Comments), Other (See Comments) Weakness Other reaction(s): Other (See Comments) Weakness Other reaction(s): Weakness Weakness    History of Present Illness    FLORENCIO Valentine is a 79 y.o. male with a hx of tobacco use, alcohol use, obesity, chronic diastolic heart failure, chronic atrial fibrillation, RBBB, CKD, HTN, DM 2, CAD, OSA.  He was last seen 06/06/2019 by Dr. Rockey Situ.  Cardiac catheterization August 2015 with occluded diagonal vessel, 40% proximal LAD disease, bifurcating marginal branch/ramus.  Ramus had 60% followed by 80% disease, OM with 50% mid disease at least.  RCA with 40% mid disease.  Recommended for medical management per previous documentation.  Echo August 2015 with EF 55 to 60%, mildly dilated left atrium, mild to moderate MR and TR.  Echo 04/2016 with mild LVH, EF 50-55%, mild MR, LA mildly dilated, RV mildly dilated with RV systolic function mildly reduced.  When last seen 06/06/2019 he noted orthostasis symptoms.  Previous intolerance to spironolactone with malaise and dizziness.  He was recommended to stop Imdur at night as well as hold Lasix on the weekends due to elevated creatinine.  Labs via care everywhere 11/16/2019  K4.3,  creatinine 1.5, GFR 44, A1c 7.7  Total cholesterol 172, triglycerides 117, LDL 119, HDL 30  Present today with his daughter.  Brings EKG from primary care showing atrial fibrillation with rate of 51 bpm.  Tells me her blood pressure was elevated when at primary care.  He checks his blood pressure routinely at home while standing due to history of orthostatic hypotension.  Whe he first stands up he gets lightheaded on occasion  though far less frequent than previous.  Reports heart rate at home 50 to 60 bpm. Tells me last Tuesday about 10am and then around 2pm he was lightheaded and "wobble headed".  Otherwise symptoms have been well controlled. BP at home around 604-540 systolic.  Reports no shortness of breath nor dyspnea on exertion. Reports no chest pain, pressure, or tightness. No edema, orthopnea, PND. Reports no palpitations.   EKGs/Labs/Other Studies Reviewed:   The following studies were reviewed today: Echo 04/2016 Study Conclusions   - Left ventricle: The cavity size was mildly reduced. There was    mild concentric hypertrophy. Systolic function was normal. The    estimated ejection fraction was in the range of 50% to 55%.    Regional wall motion abnormalities cannot be excluded. The study    is not technically sufficient to allow evaluation of LV diastolic    function.  - Mitral valve: There was mild regurgitation.  - Left atrium: The atrium was mildly dilated.  - Right ventricle: The cavity size was mildly dilated. Wall    thickness was normal. Systolic function was mildly reduced.  - Pulmonary arteries: Systolic pressure could not be accurately    estimated.   EKG:  EKG is ordered today.  The ekg ordered today demonstrates rate controlled atrial fibrillation 61 bpm with known right bundle branch block.  Recent Labs: No results found for requested labs within last 8760 hours.  Recent Lipid Panel    Component Value Date/Time   CHOL 97 (L) 08/25/2017 0841   CHOL 169 10/03/2013 0359   TRIG 128 08/25/2017 0841   TRIG 187 10/03/2013 0359   HDL 30 (L) 08/25/2017 0841   HDL 28 (L) 10/03/2013 0359   CHOLHDL 3.2 08/25/2017 0841   VLDL 37 10/03/2013 0359   LDLCALC 41 08/25/2017 0841   LDLCALC 104 (H) 10/03/2013 0359    Home Medications   Current Meds  Medication Sig  . acetaminophen (TYLENOL) 500 MG tablet Take 1,000 mg by mouth every 6 (six) hours as needed for mild pain, fever or headache.    [name] CARTIA XT 120 MG 24 hr capsule Take 1 capsule by mouth once daily  . carvedilol (COREG) 25 MG tablet Take 1 tablet (25 mg total) by mouth 2 (two) times daily with a meal.  . cloNIDine (CATAPRES) 0.1 MG tablet Take 2 tablets by mouth twice daily  . Dulaglutide 0.75 MG/0.5ML SOPN Inject into the skin.  [name] ELIQUIS 5 MG TABS tablet Take 1 tablet by mouth twice daily  . famotidine (PEPCID) 20 MG tablet Take 1 tablet (20 mg total) by mouth at bedtime.  . furosemide (LASIX) 20 MG tablet Take 20 mg by mouth daily.  [name] glimepiride (AMARYL) 4 MG tablet Take 4 mg by mouth 2 (two) times daily.   [name] linagliptin (TRADJENTA) 5 MG TABS tablet Take 5 mg by mouth daily.  [name] losartan (COZAAR) 100 MG tablet Take 100 mg by mouth daily.   . magnesium oxide (MAG-OX) 400 (241.3 Mg) MG tablet Take 1 tablet by mouth  daily.  . nitroGLYCERIN (NITROSTAT) 0.4 MG SL tablet Place 1 tablet (0.4 mg total) every 5 (five) minutes as needed under the tongue for chest pain.  [name] Potassium Chloride ER 20 MEQ TBCR Take 1 tablet by mouth once daily  . tamsulosin (FLOMAX) 0.4 MG CAPS capsule Take 0.4 mg by mouth daily.   . traZODone (DESYREL) 50 MG tablet Take 50 mg by mouth at bedtime as needed for sleep.      Review of Systems       Review of Systems  Constitutional: Negative for chills, fever and malaise/fatigue.  Cardiovascular: Negative for chest pain, dyspnea on exertion, irregular heartbeat, leg swelling, near-syncope, orthopnea, palpitations and syncope.  Respiratory: Negative for cough, shortness of breath and wheezing.   Gastrointestinal: Negative for melena, nausea and vomiting.  Genitourinary: Negative for hematuria.  Neurological: Positive for light-headedness. Negative for dizziness and weakness.   All other systems reviewed and are otherwise negative except as noted above.  Physical Exam    VS:  Ht 5\' 9"  (1.753 m)   Wt 214 lb (97.1 kg)   BMI 31.60 kg/m  , BMI Body mass index is 31.6 kg/m. GEN: Well  nourished, well developed, in no acute distress. HEENT: normal. Neck: Supple, no JVD, carotid bruits, or masses. Cardiac: RRR, no murmurs, rubs, or gallops. No clubbing, cyanosis, edema.  Radials/DP/PT 2+ and equal bilaterally.  Respiratory:  Respirations regular and unlabored, clear to auscultation bilaterally. GI: Soft, nontender, nondistended, BS + x 4. MS: No deformity or atrophy. Skin: Warm and dry, no rash. Neuro:  Strength and sensation are intact. Psych: Normal affect.  Assessment & Plan    1. Chronic atrial fibrillation/chronic anticoagulation -EKG today shows rate controlled atrial fibrillation 61 bpm.  Recent EKG with PCP 51 bpm.  As such, reduce carvedilol from 25 mg twice daily to 12.5 mg twice daily for prevention of bradycardia.  Continue chronic anticoagulation with Eliquis 5 mg twice daily, denies bleeding complications.  2. HTN- BP well controlled. Continue current antihypertensive regimen.  He will continue to monitor blood pressure while standing as we are hoping to prevent recurrent orthostatic symptoms.  3. Chronic diastolic heart failure - Euvolemic and well compensated on exam.  Carvedilol dose adjusted, as above.  Continue Lasix 20 mg daily.  4. HLD, LDL goal less than 70 -continue Zetia 10 mg daily.  Recent LDL by PCP 119.  He has previously been intolerant of statins.  Discussed lipid-lowering diet.  He politely declines PCSK9 inhibitor.  5. Coronary artery disease -stable with no anginal symptoms.  EKG today with no acute ST/T wave changes.  GDMT includes beta-blocker, Zetia,PRN nitroglycerin.  No aspirin secondary to chronic anticoagulation.  6. DM2 -continue to follow with PCP.  7. [place] with Dr. Holley Raring.   Disposition: Follow up in 3 month(s) with Dr. Rockey Situ or APP  Loel Dubonnet, NP 11/24/2019, 8:39 AM

## 2019-11-24 NOTE — Patient Instructions (Signed)
Medication Instructions:  Your physician has recommended you make the following change in your medication:   CHANGE Carvedilol (Coreg) to 12.5mg  twice daily  *If you need a refill on your cardiac medications before your next appointment, please call your pharmacy*  Lab Work: None ordered today.  Testing/Procedures: Your EKG today shows rate controlled atrial fibrillation.   Follow-Up: At [facility], you and your health needs are our priority.  As part of our continuing mission to provide you with exceptional heart care, we have created designated Provider Care Teams.  These Care Teams include your primary Cardiologist (physician) and Advanced Practice Providers (APPs -  Physician Assistants and Nurse Practitioners) who all work together to provide you with the care you need, when you need it.  We recommend signing up for the patient portal called "MyChart".  Sign up information is provided on this After Visit Summary.  MyChart is used to connect with patients for Virtual Visits (Telemedicine).  Patients are able to view lab/test results, encounter notes, upcoming appointments, etc.  Non-urgent messages can be sent to your provider as well.   To learn more about what you can do with MyChart, go to [email or web address].    Your next appointment:   3 month(s)  The format for your next appointment:   In Person  Provider:   You may see Ida Rogue, MD or one of the following Advanced Practice Providers on your designated Care Team:    Murray Hodgkins, NP  Laurann [place], NP  Christell Faith, PA-C  Marrianne Mood, PA-C  Cadence Kathlen Mody, [place]  Other Instructions  Fat and Cholesterol Restricted Eating Plan Getting too much fat and cholesterol in your diet may cause health problems. Choosing the right foods helps keep your fat and cholesterol at normal levels. This can keep you from getting certain diseases.  What are tips for following this plan? Meal planning  At meals,  divide your plate into four equal parts: ? Fill one-half of your plate with vegetables and green salads. ? Fill one-fourth of your plate with whole grains. ? Fill one-fourth of your plate with low-fat (lean) protein foods.  Eat fish that is high in omega-3 fats at least two times a week. This includes mackerel, tuna, sardines, and salmon.  Eat foods that are high in fiber, such as whole grains, beans, apples, broccoli, carrots, peas, and barley. General tips   Work with your doctor to lose weight if you need to.  Avoid: ? Foods with added sugar. ? Fried foods. ? Foods with partially hydrogenated oils.  Limit alcohol intake to no more than 1 drink a day for nonpregnant women and 2 drinks a day for men. One drink equals 12 oz of beer, 5 oz of wine, or 1 oz of hard liquor. Reading food labels  Check food labels for: ? Trans fats. ? Partially hydrogenated oils. ? Saturated fat (g) in each serving. ? Cholesterol (mg) in each serving. ? Fiber (g) in each serving.  Choose foods with healthy fats, such as: ? Monounsaturated fats. ? Polyunsaturated fats. ? Omega-3 fats.  Choose grain products that have whole grains. Look for the word "whole" as the first word in the ingredient list. Cooking  Cook foods using low-fat methods. These include baking, boiling, grilling, and broiling.  Eat more home-cooked foods. Eat at restaurants and buffets less often.  Avoid cooking using saturated fats, such as butter, cream, palm oil, palm kernel oil, and coconut oil. Recommended foods  Fruits  All fresh, canned (  in natural juice), or frozen fruits. Vegetables  Fresh or frozen vegetables (raw, steamed, roasted, or grilled). Green salads. Grains  Whole grains, such as whole wheat or whole grain breads, crackers, cereals, and pasta. Unsweetened oatmeal, bulgur, barley, quinoa, or brown rice. Corn or whole wheat flour tortillas. Meats and other protein foods  Ground beef (85% or leaner),  grass-fed beef, or beef trimmed of fat. Skinless chicken or [place]. Ground chicken or [place]. Pork trimmed of fat. All fish and seafood. Egg whites. Dried beans, peas, or lentils. Unsalted nuts or seeds. Unsalted canned beans. Nut butters without added sugar or oil. Dairy  Low-fat or nonfat dairy products, such as skim or 1% milk, 2% or reduced-fat cheeses, low-fat and fat-free ricotta or cottage cheese, or plain low-fat and nonfat yogurt. Fats and oils  Tub margarine without trans fats. Light or reduced-fat mayonnaise and salad dressings. Avocado. Olive, canola, sesame, or safflower oils. The items listed above may not be a complete list of foods and beverages you can eat. Contact a dietitian for more information. Foods to avoid Fruits  Canned fruit in heavy syrup. Fruit in cream or butter sauce. Fried fruit. Vegetables  Vegetables cooked in cheese, cream, or butter sauce. Fried vegetables. Grains  White bread. White pasta. White rice. Cornbread. Bagels, pastries, and croissants. Crackers and snack foods that contain trans fat and hydrogenated oils. Meats and other protein foods  Fatty cuts of meat. Ribs, chicken wings, bacon, sausage, bologna, salami, chitterlings, fatback, hot dogs, bratwurst, and packaged lunch meats. Liver and organ meats. Whole eggs and egg yolks. Chicken and [place] with skin. Fried meat. Dairy  Whole or 2% milk, cream, half-and-half, and cream cheese. Whole milk cheeses. Whole-fat or sweetened yogurt. Full-fat cheeses. Nondairy creamers and whipped toppings. Processed cheese, cheese spreads, and cheese curds. Beverages  Alcohol. Sugar-sweetened drinks such as sodas, lemonade, and fruit drinks. Fats and oils  Butter, stick margarine, lard, shortening, ghee, or bacon fat. Coconut, palm kernel, and palm oils. Sweets and desserts  Corn syrup, sugars, honey, and molasses. Candy. Jam and jelly. Syrup. Sweetened cereals. Cookies, pies, cakes, donuts, muffins, and ice  cream. The items listed above may not be a complete list of foods and beverages you should avoid. Contact a dietitian for more information. Summary  Choosing the right foods helps keep your fat and cholesterol at normal levels. This can keep you from getting certain diseases.  At meals, fill one-half of your plate with vegetables and green salads.  Eat high-fiber foods, like whole grains, beans, apples, carrots, peas, and barley.  Limit added sugar, saturated fats, alcohol, and fried foods. This information is not intended to replace advice given to you by your health care provider. Make sure you discuss any questions you have with your health care provider. Document Revised: 10/14/2017 Document Reviewed: 10/28/2016 Elsevier Patient Education  [place].

## 2019-12-22 DIAGNOSIS — E1122 Type 2 diabetes mellitus with diabetic chronic kidney disease: Secondary | ICD-10-CM | POA: Diagnosis not present

## 2019-12-22 DIAGNOSIS — R809 Proteinuria, unspecified: Secondary | ICD-10-CM | POA: Diagnosis not present

## 2019-12-22 DIAGNOSIS — N2581 Secondary hyperparathyroidism of renal origin: Secondary | ICD-10-CM | POA: Diagnosis not present

## 2019-12-22 DIAGNOSIS — N1831 Chronic kidney disease, stage 3a: Secondary | ICD-10-CM | POA: Diagnosis not present

## 2019-12-22 DIAGNOSIS — I10 Essential (primary) hypertension: Secondary | ICD-10-CM | POA: Diagnosis not present

## 2019-12-26 DIAGNOSIS — D0462 Carcinoma in situ of skin of left upper limb, including shoulder: Secondary | ICD-10-CM | POA: Diagnosis not present

## 2019-12-26 DIAGNOSIS — C44629 Squamous cell carcinoma of skin of left upper limb, including shoulder: Secondary | ICD-10-CM | POA: Diagnosis not present

## 2019-12-26 DIAGNOSIS — D225 Melanocytic nevi of trunk: Secondary | ICD-10-CM | POA: Diagnosis not present

## 2019-12-26 DIAGNOSIS — D044 Carcinoma in situ of skin of scalp and neck: Secondary | ICD-10-CM | POA: Diagnosis not present

## 2019-12-26 DIAGNOSIS — C44229 Squamous cell carcinoma of skin of left ear and external auricular canal: Secondary | ICD-10-CM | POA: Diagnosis not present

## 2019-12-26 DIAGNOSIS — L570 Actinic keratosis: Secondary | ICD-10-CM | POA: Diagnosis not present

## 2019-12-26 DIAGNOSIS — Z85820 Personal history of malignant melanoma of skin: Secondary | ICD-10-CM | POA: Diagnosis not present

## 2019-12-26 DIAGNOSIS — X32XXXA Exposure to sunlight, initial encounter: Secondary | ICD-10-CM | POA: Diagnosis not present

## 2019-12-26 DIAGNOSIS — D2262 Melanocytic nevi of left upper limb, including shoulder: Secondary | ICD-10-CM | POA: Diagnosis not present

## 2019-12-26 DIAGNOSIS — Z85828 Personal history of other malignant neoplasm of skin: Secondary | ICD-10-CM | POA: Diagnosis not present

## 2019-12-26 DIAGNOSIS — D485 Neoplasm of uncertain behavior of skin: Secondary | ICD-10-CM | POA: Diagnosis not present

## 2019-12-26 DIAGNOSIS — D2261 Melanocytic nevi of right upper limb, including shoulder: Secondary | ICD-10-CM | POA: Diagnosis not present

## 2019-12-26 DIAGNOSIS — C44519 Basal cell carcinoma of skin of other part of trunk: Secondary | ICD-10-CM | POA: Diagnosis not present

## 2019-12-26 DIAGNOSIS — C4441 Basal cell carcinoma of skin of scalp and neck: Secondary | ICD-10-CM | POA: Diagnosis not present

## 2019-12-31 ENCOUNTER — Other Ambulatory Visit: Payer: Self-pay | Admitting: Cardiovascular Disease

## 2020-01-05 DIAGNOSIS — E119 Type 2 diabetes mellitus without complications: Secondary | ICD-10-CM | POA: Diagnosis not present

## 2020-01-06 DIAGNOSIS — Z23 Encounter for immunization: Secondary | ICD-10-CM | POA: Diagnosis not present

## 2020-01-17 ENCOUNTER — Emergency Department: Payer: Worker's Compensation

## 2020-01-17 ENCOUNTER — Emergency Department
Admission: EM | Admit: 2020-01-17 | Discharge: 2020-01-17 | Disposition: A | Discharge status: Home / Self Care | Payer: Worker's Compensation | Attending: Emergency Medicine | Admitting: Emergency Medicine

## 2020-01-17 ENCOUNTER — Other Ambulatory Visit: Payer: Self-pay

## 2020-01-17 ENCOUNTER — Encounter: Payer: Self-pay | Admitting: Emergency Medicine

## 2020-01-17 DIAGNOSIS — R079 Chest pain, unspecified: Secondary | ICD-10-CM | POA: Diagnosis not present

## 2020-01-17 DIAGNOSIS — I2510 Atherosclerotic heart disease of native coronary artery without angina pectoris: Secondary | ICD-10-CM | POA: Insufficient documentation

## 2020-01-17 DIAGNOSIS — Z87891 Personal history of nicotine dependence: Secondary | ICD-10-CM | POA: Insufficient documentation

## 2020-01-17 DIAGNOSIS — I110 Hypertensive heart disease with heart failure: Secondary | ICD-10-CM | POA: Insufficient documentation

## 2020-01-17 DIAGNOSIS — I5033 Acute on chronic diastolic (congestive) heart failure: Secondary | ICD-10-CM | POA: Diagnosis not present

## 2020-01-17 DIAGNOSIS — Z79899 Other long term (current) drug therapy: Secondary | ICD-10-CM | POA: Diagnosis not present

## 2020-01-17 DIAGNOSIS — E119 Type 2 diabetes mellitus without complications: Secondary | ICD-10-CM | POA: Insufficient documentation

## 2020-01-17 DIAGNOSIS — Z041 Encounter for examination and observation following transport accident: Secondary | ICD-10-CM | POA: Diagnosis not present

## 2020-01-17 DIAGNOSIS — M25561 Pain in right knee: Secondary | ICD-10-CM | POA: Diagnosis not present

## 2020-01-17 DIAGNOSIS — M7918 Myalgia, other site: Secondary | ICD-10-CM

## 2020-01-17 MED ORDER — LIDOCAINE 5 % EX PTCH
1.0000 | MEDICATED_PATCH | CUTANEOUS | Status: DC
  Administered 2020-01-17: 1 via TRANSDERMAL
  Filled 2020-01-17: qty 1

## 2020-01-17 MED ORDER — TRAMADOL HCL 50 MG PO TABS: 50.0000 mg | ORAL_TABLET | Freq: Two times a day (BID) | ORAL | 0 refills | Status: DC | PRN

## 2020-01-17 MED ORDER — BACITRACIN-NEOMYCIN-POLYMYXIN 400-5-5000 EX OINT
TOPICAL_OINTMENT | Freq: Once | CUTANEOUS | Status: AC
  Filled 2020-01-17: qty 1

## 2020-01-17 MED ORDER — LIDOCAINE 5 % EX PTCH: 1.0000 | MEDICATED_PATCH | Freq: Two times a day (BID) | CUTANEOUS | 0 refills | Status: DC

## 2020-01-17 MED ORDER — TRAMADOL HCL 50 MG PO TABS
50.0000 mg | ORAL_TABLET | Freq: Once | ORAL | Status: AC
  Administered 2020-01-17: 50 mg via ORAL
  Filled 2020-01-17: qty 1
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
## 2020-01-17 NOTE — ED Notes (Signed)
Pt in X ray

## 2020-01-17 NOTE — Discharge Instructions (Addendum)
Follow discharge care instruction take medication as directed.  Advised to wear knee immobilizer for 3 to 5 days when ambulating.

## 2020-01-24 DIAGNOSIS — T148XXA Other injury of unspecified body region, initial encounter: Secondary | ICD-10-CM | POA: Diagnosis not present

## 2020-01-24 DIAGNOSIS — M25561 Pain in right knee: Secondary | ICD-10-CM | POA: Diagnosis not present

## 2020-02-02 DIAGNOSIS — M25461 Effusion, right knee: Secondary | ICD-10-CM | POA: Diagnosis not present

## 2020-02-02 DIAGNOSIS — S8991XA Unspecified injury of right lower leg, initial encounter: Secondary | ICD-10-CM | POA: Diagnosis not present

## 2020-02-07 DIAGNOSIS — C44229 Squamous cell carcinoma of skin of left ear and external auricular canal: Secondary | ICD-10-CM | POA: Diagnosis not present

## 2020-02-07 DIAGNOSIS — D0422 Carcinoma in situ of skin of left ear and external auricular canal: Secondary | ICD-10-CM | POA: Diagnosis not present

## 2020-02-14 DIAGNOSIS — C44629 Squamous cell carcinoma of skin of left upper limb, including shoulder: Secondary | ICD-10-CM | POA: Diagnosis not present

## 2020-02-14 DIAGNOSIS — D2362 Other benign neoplasm of skin of left upper limb, including shoulder: Secondary | ICD-10-CM | POA: Diagnosis not present

## 2020-02-22 DIAGNOSIS — N1832 Chronic kidney disease, stage 3b: Secondary | ICD-10-CM | POA: Diagnosis not present

## 2020-02-22 DIAGNOSIS — E1122 Type 2 diabetes mellitus with diabetic chronic kidney disease: Secondary | ICD-10-CM | POA: Diagnosis not present

## 2020-02-22 DIAGNOSIS — I129 Hypertensive chronic kidney disease with stage 1 through stage 4 chronic kidney disease, or unspecified chronic kidney disease: Secondary | ICD-10-CM | POA: Diagnosis not present
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
## 2020-02-28 ENCOUNTER — Ambulatory Visit (INDEPENDENT_AMBULATORY_CARE_PROVIDER_SITE_OTHER): Payer: Medicare Other | Admitting: Cardiovascular Disease

## 2020-02-28 ENCOUNTER — Other Ambulatory Visit: Payer: Self-pay

## 2020-02-28 ENCOUNTER — Encounter: Payer: Self-pay | Admitting: Cardiovascular Disease

## 2020-02-28 VITALS — BP 130/80 | HR 77 | Ht 68.0 in | Wt 213.0 lb

## 2020-02-28 DIAGNOSIS — I10 Essential (primary) hypertension: Secondary | ICD-10-CM

## 2020-02-28 DIAGNOSIS — E782 Mixed hyperlipidemia: Secondary | ICD-10-CM

## 2020-02-28 DIAGNOSIS — I5032 Chronic diastolic (congestive) heart failure: Secondary | ICD-10-CM

## 2020-02-28 DIAGNOSIS — E118 Type 2 diabetes mellitus with unspecified complications: Secondary | ICD-10-CM | POA: Diagnosis not present

## 2020-02-28 DIAGNOSIS — I482 Chronic atrial fibrillation, unspecified: Secondary | ICD-10-CM | POA: Diagnosis not present

## 2020-02-28 DIAGNOSIS — I25118 Atherosclerotic heart disease of native coronary artery with other forms of angina pectoris: Secondary | ICD-10-CM

## 2020-02-28 DIAGNOSIS — R0602 Shortness of breath: Secondary | ICD-10-CM

## 2020-02-28 DIAGNOSIS — R600 Localized edema: Secondary | ICD-10-CM

## 2020-02-28 MED ORDER — ROSUVASTATIN CALCIUM 5 MG PO TABS: 5.0000 mg | ORAL_TABLET | Freq: Every day | ORAL | 3 refills | Status: DC

## 2020-02-28 NOTE — Patient Instructions (Addendum)
Medication Instructions:  Please start crestor 5 mg daily  If you need a refill on your cardiac medications before your next appointment, please call your pharmacy.    Lab work: No new labs needed   If you have labs (blood work) drawn today and your tests are completely normal, you will receive your results only by: [name] MyChart Message (if you have MyChart) OR . A paper copy in the mail If you have any lab test that is abnormal or we need to change your treatment, we will call you to review the results.   Testing/Procedures: No new testing needed   Follow-Up: At [facility], you and your health needs are our priority.  As part of our continuing mission to provide you with exceptional heart care, we have created designated Provider Care Teams.  These Care Teams include your primary Cardiologist (physician) and Advanced Practice Providers (APPs -  Physician Assistants and Nurse Practitioners) who all work together to provide you with the care you need, when you need it.  . You will need a follow up appointment in 6 months  . Providers on your designated Care Team:   . Murray Hodgkins, NP . Christell Faith, PA-C . Marrianne Mood, PA-C  Any Other Special Instructions Will Be Listed Below (If Applicable).  COVID-19 Vaccine Information can be found at: ShippingScam.co.uk For questions related to vaccine distribution or appointments, please email vaccine@[email or web address] .com or call [phone].

## 2020-03-09 ENCOUNTER — Telehealth: Payer: Self-pay | Admitting: Cardiovascular Disease

## 2020-03-09 NOTE — Telephone Encounter (Signed)
Clinical pharmacist to review Eliquis 

## 2020-03-09 NOTE — Telephone Encounter (Signed)
Left a message for the patient to call back and speak to the on-cal preop APP of the day.

## 2020-03-09 NOTE — Telephone Encounter (Signed)
Patient with diagnosis of atrial fibrillation on Eliquis for anticoagulation.    Procedure: colonoscopy Date of procedure: 04/11/20    CHA2DS2-VASc Score = 6  This indicates a 9.7% annual risk of stroke. The patient's score is based upon: CHF History: Yes HTN History: Yes Diabetes History: Yes Stroke History: No Vascular Disease History: Yes Age Score: 2 Gender Score: 0   CrCl 55.7 Platelet count 145  Per office protocol, patient can hold Eliquis for 1 days prior to procedure.   Patient will not need bridging with Lovenox (enoxaparin) around procedure.  Patient should restart Eliquis on the evening of procedure or day after, at discretion of procedure MD

## 2020-03-09 NOTE — Telephone Encounter (Signed)
   [facility] Pre-operative Risk Assessment    HEARTCARE STAFF: - Please ensure there is not already an duplicate clearance open for this procedure. - Under Visit Info/Reason for Call, type in Other and utilize the format Clearance MM/DD/YY or Clearance TBD. Do not use dashes or single digits. - If request is for dental extraction, please clarify the # of teeth to be extracted.  Request for surgical clearance:  1. What type of surgery is being performed? colonoscopy  2. When is this surgery scheduled? 04/11/20  3. What type of clearance is required (medical clearance vs. Pharmacy clearance to hold med vs. Both)? Not noted  4. Are there any medications that need to be held prior to surgery and how long? Advise on any anticoagulants   5. Practice name and name of physician performing surgery? [facility]  6. What is the office phone number? [phone]   7.   What is the office fax number? [phone]  8.   Anesthesia type (None, local, MAC, general) ? Not noted   Marykay Lex 03/09/2020, 10:30 AM  _________________________________________________________________   (provider comments below)

## 2020-03-12 NOTE — Telephone Encounter (Signed)
   Primary Cardiologist: Ida Rogue, MD  Chart reviewed as part of pre-operative protocol coverage. He was last seen 02/28/2020 by Dr. Rockey Situ and doing well.  He was recommended for a 6 month follow up. Given past medical history and time since last visit, based on ACC/AHA guidelines, Johnathin Vanderschaaf Kujala would be at acceptable risk for the planned procedure without further cardiovascular testing.   Per pharmacy review and office protocol he may hold Eliquis 1 day prior to the planned procedure. He will not need bridging with Lovenox (enoxaparin) around the procedure.   Left VM with detailed directions per DPR. The patient was advised that if he develops new symptoms prior to surgery to contact our office to arrange for a follow-up visit, and he verbalized understanding.  I will route this recommendation to the requesting party via Epic fax function and remove from pre-op pool.  Please call with questions.  Loel Dubonnet, NP 03/12/2020, 11:14 AM

## 2020-03-19 ENCOUNTER — Other Ambulatory Visit: Payer: Self-pay | Admitting: Cardiovascular Disease

## 2020-03-20 NOTE — Telephone Encounter (Signed)
Rx request sent to pharmacy.  

## 2020-04-09 ENCOUNTER — Other Ambulatory Visit
Admission: RE | Admit: 2020-04-09 | Discharge: 2020-04-09 | Disposition: A | Discharge status: Home / Self Care | Payer: Medicare Other | Source: Ambulatory Visit | Attending: Internal Medicine | Admitting: Internal Medicine

## 2020-04-09 ENCOUNTER — Other Ambulatory Visit: Payer: Self-pay

## 2020-04-09 DIAGNOSIS — Z01812 Encounter for preprocedural laboratory examination: Secondary | ICD-10-CM | POA: Insufficient documentation

## 2020-04-09 DIAGNOSIS — Z20822 Contact with and (suspected) exposure to covid-19: Secondary | ICD-10-CM | POA: Insufficient documentation

## 2020-04-09 LAB — SARS CORONAVIRUS 2 (TAT 6-24 HRS): SARS Coronavirus 2: NEGATIVE

## 2020-04-10 ENCOUNTER — Encounter: Payer: Self-pay | Admitting: Internal Medicine

## 2020-04-11 ENCOUNTER — Ambulatory Visit: Payer: Medicare Other | Admitting: Anesthesiology

## 2020-04-11 ENCOUNTER — Encounter: Payer: Self-pay | Admitting: Internal Medicine

## 2020-04-11 ENCOUNTER — Ambulatory Visit
Admission: RE | Admit: 2020-04-11 | Discharge: 2020-04-11 | Disposition: A | Discharge status: Home / Self Care | Payer: Medicare Other | Attending: Internal Medicine | Admitting: Internal Medicine

## 2020-04-11 ENCOUNTER — Other Ambulatory Visit: Payer: Self-pay

## 2020-04-11 ENCOUNTER — Encounter
Admission: RE | Disposition: A | Discharge status: Home / Self Care | Payer: Self-pay | Source: Home / Self Care | Attending: Internal Medicine

## 2020-04-11 DIAGNOSIS — Z888 Allergy status to other drugs, medicaments and biological substances status: Secondary | ICD-10-CM | POA: Insufficient documentation

## 2020-04-11 DIAGNOSIS — I482 Chronic atrial fibrillation, unspecified: Secondary | ICD-10-CM | POA: Diagnosis not present

## 2020-04-11 DIAGNOSIS — E119 Type 2 diabetes mellitus without complications: Secondary | ICD-10-CM | POA: Diagnosis not present

## 2020-04-11 DIAGNOSIS — K640 First degree hemorrhoids: Secondary | ICD-10-CM | POA: Diagnosis not present

## 2020-04-11 DIAGNOSIS — I2510 Atherosclerotic heart disease of native coronary artery without angina pectoris: Secondary | ICD-10-CM | POA: Insufficient documentation

## 2020-04-11 DIAGNOSIS — Z79899 Other long term (current) drug therapy: Secondary | ICD-10-CM | POA: Diagnosis not present

## 2020-04-11 DIAGNOSIS — Z7984 Long term (current) use of oral hypoglycemic drugs: Secondary | ICD-10-CM | POA: Insufficient documentation

## 2020-04-11 DIAGNOSIS — E785 Hyperlipidemia, unspecified: Secondary | ICD-10-CM | POA: Insufficient documentation

## 2020-04-11 DIAGNOSIS — D1779 Benign lipomatous neoplasm of other sites: Secondary | ICD-10-CM | POA: Insufficient documentation

## 2020-04-11 DIAGNOSIS — D122 Benign neoplasm of ascending colon: Secondary | ICD-10-CM | POA: Insufficient documentation

## 2020-04-11 DIAGNOSIS — K5730 Diverticulosis of large intestine without perforation or abscess without bleeding: Secondary | ICD-10-CM | POA: Insufficient documentation

## 2020-04-11 DIAGNOSIS — E7800 Pure hypercholesterolemia, unspecified: Secondary | ICD-10-CM | POA: Insufficient documentation

## 2020-04-11 DIAGNOSIS — Z86010 Personal history of colonic polyps: Secondary | ICD-10-CM | POA: Insufficient documentation

## 2020-04-11 DIAGNOSIS — N179 Acute kidney failure, unspecified: Secondary | ICD-10-CM | POA: Insufficient documentation

## 2020-04-11 DIAGNOSIS — Z1211 Encounter for screening for malignant neoplasm of colon: Secondary | ICD-10-CM | POA: Diagnosis present

## 2020-04-11 DIAGNOSIS — Z7901 Long term (current) use of anticoagulants: Secondary | ICD-10-CM | POA: Diagnosis not present

## 2020-04-11 DIAGNOSIS — Z881 Allergy status to other antibiotic agents status: Secondary | ICD-10-CM | POA: Insufficient documentation

## 2020-04-11 DIAGNOSIS — D124 Benign neoplasm of descending colon: Secondary | ICD-10-CM | POA: Diagnosis not present

## 2020-04-11 DIAGNOSIS — I110 Hypertensive heart disease with heart failure: Secondary | ICD-10-CM | POA: Insufficient documentation

## 2020-04-11 DIAGNOSIS — K6389 Other specified diseases of intestine: Secondary | ICD-10-CM | POA: Diagnosis not present

## 2020-04-11 DIAGNOSIS — Z885 Allergy status to narcotic agent status: Secondary | ICD-10-CM | POA: Insufficient documentation

## 2020-04-11 DIAGNOSIS — D125 Benign neoplasm of sigmoid colon: Secondary | ICD-10-CM | POA: Insufficient documentation

## 2020-04-11 DIAGNOSIS — Z886 Allergy status to analgesic agent status: Secondary | ICD-10-CM | POA: Insufficient documentation

## 2020-04-11 DIAGNOSIS — I5032 Chronic diastolic (congestive) heart failure: Secondary | ICD-10-CM | POA: Insufficient documentation

## 2020-04-11 DIAGNOSIS — Z85820 Personal history of malignant melanoma of skin: Secondary | ICD-10-CM | POA: Insufficient documentation

## 2020-04-11 HISTORY — DX: Cardiac arrhythmia, unspecified: I49.9

## 2020-04-11 HISTORY — DX: Hypomagnesemia: E83.42

## 2020-04-11 HISTORY — DX: Cardiac murmur, unspecified: R01.1

## 2020-04-11 HISTORY — DX: Hypokalemia: E87.6

## 2020-04-11 HISTORY — DX: Diverticulitis of large intestine without perforation or abscess without bleeding: K57.32

## 2020-04-11 HISTORY — PX: COLONOSCOPY WITH PROPOFOL: SHX5780

## 2020-04-11 HISTORY — DX: Malignant melanoma of skin, unspecified: C43.9

## 2020-04-11 HISTORY — DX: Pure hypercholesterolemia, unspecified: E78.00

## 2020-04-11 LAB — GLUCOSE, CAPILLARY: Glucose-Capillary: 177 mg/dL — ABNORMAL HIGH (ref 70–99)

## 2020-04-11 SURGERY — COLONOSCOPY WITH PROPOFOL
Anesthesia: General

## 2020-04-11 MED ORDER — PROPOFOL 500 MG/50ML IV EMUL
INTRAVENOUS | Status: DC | PRN
  Administered 2020-04-11: 150 ug/kg/min via INTRAVENOUS

## 2020-04-11 MED ORDER — PROPOFOL 10 MG/ML IV BOLUS
INTRAVENOUS | Status: AC
  Filled 2020-04-11: qty 20

## 2020-04-11 MED ORDER — PROPOFOL 500 MG/50ML IV EMUL
INTRAVENOUS | Status: AC
  Filled 2020-04-11: qty 50

## 2020-04-11 MED ORDER — PROPOFOL 10 MG/ML IV BOLUS
INTRAVENOUS | Status: DC | PRN
  Administered 2020-04-11: 80 mg via INTRAVENOUS

## 2020-04-11 MED ORDER — SODIUM CHLORIDE 0.9 % IV SOLN: INTRAVENOUS | Status: DC

## 2020-04-11 NOTE — Op Note (Signed)
[facility] Patient Name: Todd Valentine Procedure Date: 04/11/2020 8:39 AM MRN: [id number] Account #: [id number] Date of Birth: 1940/10/11 Admit Type: Outpatient Age: 80 Room: [facility] ENDO ROOM 2 Gender: Male Note Status: Finalized Procedure:             Colonoscopy Indications:           High risk colon cancer surveillance: Personal history                         of colonic polyps Providers:             Benay Pike. [place] MD, MD Medicines:             Propofol per Anesthesia Complications:         No immediate complications. Procedure:             Pre-Anesthesia Assessment:                        - The risks and benefits of the procedure and the                         sedation options and risks were discussed with the                         patient. All questions were answered and informed                         consent was obtained.                        - Patient identification and proposed procedure were                         verified prior to the procedure by the nurse. The                         procedure was verified in the procedure room.                        - ASA Grade Assessment: III - A patient with severe                         systemic disease.                        - After reviewing the risks and benefits, the patient                         was deemed in satisfactory condition to undergo the                         procedure.                        After obtaining informed consent, the colonoscope was                         passed under direct vision. Throughout the procedure,  the patient's blood pressure, pulse, and oxygen                         saturations were monitored continuously. The                         Colonoscope was introduced through the anus and                         advanced to the the cecum, identified by appendiceal                         orifice and ileocecal valve. The  colonoscopy was                         performed without difficulty. The patient tolerated                         the procedure well. The quality of the bowel                         preparation was adequate. The ileocecal valve,                         appendiceal orifice, and rectum were photographed. Findings:      The perianal and digital rectal examinations were normal. Pertinent       negatives include normal sphincter tone and no palpable rectal lesions.      Multiple small and large-mouthed diverticula were found in the entire       colon.      There was a small lipoma, 20 mm in diameter, in the ascending colon.       Biopsies were taken with a cold forceps for histology.      A 8 mm polyp was found in the descending colon. The polyp was sessile.       The polyp was removed with a hot snare. Resection and retrieval were       complete. To prevent bleeding after the polypectomy, one hemostatic clip       was successfully placed (MR conditional). There was no bleeding during,       or at the end, of the procedure.      An area of moderately congested mucosa was found in the mid sigmoid       colon. Biopsies were taken with a cold forceps for histology.      Non-bleeding internal hemorrhoids were found during retroflexion. The       hemorrhoids were Grade I (internal hemorrhoids that do not prolapse).      The exam was otherwise without abnormality. Impression:            - Diverticulosis in the entire examined colon.                        - Small lipoma in the ascending colon. Biopsied.                        - One 8 mm polyp in the descending colon, removed with  a hot snare. Resected and retrieved. Clip (MR                         conditional) was placed.                        - Congested mucosa in the mid sigmoid colon. Biopsied.                        - Non-bleeding internal hemorrhoids.                        - The examination was otherwise  normal. Recommendation:        - Patient has a contact number available for                         emergencies. The signs and symptoms of potential                         delayed complications were discussed with the patient.                         Return to normal activities tomorrow. Written                         discharge instructions were provided to the patient.                        - Resume previous diet.                        - Continue present medications.                        - Await pathology results.                        - If polyps are benign or adenomatous without                         dysplasia, I will advise NO further colonoscopy due to                         advanced age and/or severe comorbidity.                        - You do NOT require further colon cancer screening                         measures (Annual stool testing (i.e. hemoccult, FIT,                         cologuard), sigmoidoscopy, colonoscopy or CT                         colonography). You should share this recommendation                         with your Primary Care provider.                        -  Return to GI office PRN.                        - The findings and recommendations were discussed with                         the patient. Procedure Code(s):     --- Professional ---                        [phone], Colonoscopy, flexible; with removal of                         tumor(s), polyp(s), or other lesion(s) by snare                         technique                        45380, 65, Colonoscopy, flexible; with biopsy, single                         or multiple Diagnosis Code(s):     --- Professional ---                        K57.30, Diverticulosis of large intestine without                         perforation or abscess without bleeding                        K63.89, Other specified diseases of intestine                        K63.5, Polyp of colon                        D17.5,  Benign lipomatous neoplasm of intra-abdominal                         organs                        K64.0, First degree hemorrhoids                        Z86.010, Personal history of colonic polyps CPT copyright 2019 American Medical Association. All rights reserved. The codes documented in this report are preliminary and upon coder review may  be revised to meet current compliance requirements. Efrain Sella MD, MD 04/11/2020 9:00:17 AM This report has been signed electronically. Number of Addenda: 0 Note Initiated On: 04/11/2020 8:39 AM Scope Withdrawal Time: 0 hours 9 minutes 4 seconds  Total Procedure Duration: 0 hours 12 minutes 7 seconds  Estimated Blood Loss:  Estimated blood loss: none.      [facility]

## 2020-04-11 NOTE — Anesthesia Preprocedure Evaluation (Signed)
Anesthesia Evaluation  Patient identified by MRN, date of birth, ID band Patient awake    Reviewed: Allergy & Precautions, NPO status , Patient's Chart, lab work & pertinent test results  History of Anesthesia Complications Negative for: history of anesthetic complications  Airway Mallampati: III  TM Distance: >3 FB Neck ROM: Full    Dental  (+) Upper Dentures, Partial Lower   Pulmonary sleep apnea and Continuous Positive Airway Pressure Ventilation , neg COPD, Patient abstained from smoking.Not current smoker, former smoker,    Pulmonary exam normal breath sounds clear to auscultation       Cardiovascular Exercise Tolerance: Poor METShypertension, + CAD and +CHF  (-) Past MI + dysrhythmias Atrial Fibrillation  Rhythm:Regular Rate:Normal - Systolic murmurs Stress test 2015 with no evidence of ischemia   Neuro/Psych PSYCHIATRIC DISORDERS Depression negative neurological ROS     GI/Hepatic neg GERD  ,(+)     (-) substance abuse  ,   Endo/Other  diabetes  Renal/GU CRFRenal disease     Musculoskeletal   Abdominal   Peds  Hematology   Anesthesia Other Findings Past Medical History: No date: Acute renal failure ([name]) No date: BPH (benign prostatic hyperplasia) No date: Chronic atrial fibrillation ([name]) No date: Chronic diastolic CHF (congestive heart failure) ([name])     Comment:  a. echo as above No date: Chronic insomnia 10/06/2013: Coronary artery disease, non-occlusive     Comment:  a. cath 09/2013: pLAD 40, CTO Diag, ramus 40-->80, OM 2               mRCA 40, med rx No date: Depression No date: Diabetes mellitus with complication ([name]) No date: Diverticulitis of colon No date: Dysrhythmia     Comment:  atrial fibrillation No date: Heart murmur     Comment:  systolic murmur, unspecified No date: History of diverticulitis No date: Hypercholesterolemia No date: Hyperkalemia No date: Hyperlipidemia No date:  Hyperlipidemia No date: Hypertension No date: Hypokalemia No date: Hypomagnesemia No date: Malignant melanoma ([place]) No date: Melanoma ([place]) No date: Mitral regurgitation     Comment:  a. echo 09/2013: EF 55-60%, mildly dilated left atrrium,               mild to moderate MR/TR No date: Sleep apnea in adult  Reproductive/Obstetrics                            Anesthesia Physical Anesthesia Plan  ASA: III  Anesthesia Plan: General   Post-op Pain Management:    Induction: Intravenous  PONV Risk Score and Plan: 2 and Ondansetron, Propofol infusion and TIVA  Airway Management Planned: Nasal Cannula  Additional Equipment: None  Intra-op Plan:   Post-operative Plan:   Informed Consent: I have reviewed the patients History and Physical, chart, labs and discussed the procedure including the risks, benefits and alternatives for the proposed anesthesia with the patient or authorized representative who has indicated his/her understanding and acceptance.     Dental advisory given  Plan Discussed with: CRNA and Surgeon  Anesthesia Plan Comments: (Discussed risks of anesthesia with patient, including possibility of difficulty with spontaneous ventilation under anesthesia necessitating airway intervention, PONV, and rare risks such as cardiac or respiratory or neurological events. Patient understands.)        Anesthesia Quick Evaluation

## 2020-04-11 NOTE — Transfer of Care (Signed)
Immediate Anesthesia Transfer of Care Note  Patient: Todd Valentine  Procedure(s) Performed: COLONOSCOPY WITH PROPOFOL (N/A )  Patient Location: PACU and Endoscopy Unit  Anesthesia Type:General  Level of Consciousness: drowsy  Airway & Oxygen Therapy: Patient Spontanous Breathing  Post-op Assessment: Report given to RN  Post vital signs: stable  Last Vitals:  Vitals Value Taken Time  BP    Temp    Pulse    Resp    SpO2      Last Pain:  Vitals:   04/11/20 0726  TempSrc: Skin  PainSc: 0-No pain         Complications: No complications documented.

## 2020-04-11 NOTE — Interval H&P Note (Signed)
History and Physical Interval Note:  04/11/2020 8:32 AM  Todd Valentine  has presented today for surgery, with the diagnosis of HX ADEN COLONIC POLYPS.  The various methods of treatment have been discussed with the patient and family. After consideration of risks, benefits and other options for treatment, the patient has consented to  Procedure(s) with comments: COLONOSCOPY WITH PROPOFOL (N/A) - DM  ELIQUIS as a surgical intervention.  The patient's history has been reviewed, patient examined, no change in status, stable for surgery.  I have reviewed the patient's chart and labs.  Questions were answered to the patient's satisfaction.     [place], [place]

## 2020-04-11 NOTE — H&P (Signed)
Outpatient short stay form Pre-procedure 04/11/2020 8:31 AM Bethanie Bloxom K. Alice Reichert, M.D.  Primary Physician: Shelda Jakes, M.D.  Reason for visit:  Personal hx of adenomatous colon polyps  History of present illness:                            Patient presents for colonoscopy for a personal hx of colon polyps. The patient denies abdominal pain, abnormal weight loss or rectal bleeding.     Current Facility-Administered Medications:  .  0.9 %  sodium chloride infusion, , Intravenous, Continuous, [place], Benay Pike, MD, Last Rate: 20 mL/hr at 04/11/20 0801, Continued from Pre-op at 04/11/20 0801  Medications Prior to Admission  Medication Sig Dispense Refill Last Dose  . carvedilol (COREG) 12.5 MG tablet Take 1 tablet (12.5 mg total) by mouth 2 (two) times daily with a meal. 180 tablet 1 04/11/2020 at Unknown time  . cloNIDine (CATAPRES) 0.1 MG tablet Take 2 tablets by mouth twice daily 360 tablet 1 04/11/2020 at Unknown time  . diltiazem (CARDIZEM CD) 120 MG 24 hr capsule Take 1 capsule by mouth once daily 90 capsule 1 04/11/2020 at Unknown time  . ezetimibe (ZETIA) 10 MG tablet Take 1 tablet (10 mg total) by mouth daily. 90 tablet 3 04/10/2020 at Unknown time  . furosemide (LASIX) 20 MG tablet Takes 20 mg daily except on Saturdays and Sundays   04/10/2020 at Unknown time  . losartan (COZAAR) 100 MG tablet Take 100 mg by mouth daily.    04/10/2020 at Unknown time  . Potassium Chloride ER 20 MEQ TBCR Take 1 tablet by mouth once daily 90 tablet 1 04/10/2020 at Unknown time  . rosuvastatin (CRESTOR) 5 MG tablet Take 1 tablet (5 mg total) by mouth daily. 90 tablet 3 04/10/2020 at Unknown time  . sildenafil (REVATIO) 20 MG tablet Take 20 mg by mouth 3 (three) times daily.     . tamsulosin (FLOMAX) 0.4 MG CAPS capsule Take 0.4 mg by mouth daily.    04/10/2020 at Unknown time  . traZODone (DESYREL) 50 MG tablet Take 50 mg by mouth at bedtime as needed for sleep.   04/10/2020 at Unknown time  . acetaminophen  (TYLENOL) 500 MG tablet Take 1,000 mg by mouth every 6 (six) hours as needed for mild pain, fever or headache.      [name] ELIQUIS 5 MG TABS tablet Take 1 tablet by mouth twice daily 60 tablet 5 04/09/2020  . famotidine (PEPCID) 20 MG tablet Take 1 tablet (20 mg total) by mouth at bedtime.     [name] glimepiride (AMARYL) 4 MG tablet Take 4 mg by mouth 2 (two) times daily.      [name] linagliptin (TRADJENTA) 5 MG TABS tablet Take 5 mg by mouth daily.     . magnesium oxide (MAG-OX) 400 (241.3 Mg) MG tablet Take 1 tablet by mouth daily.     . nitroGLYCERIN (NITROSTAT) 0.4 MG SL tablet Place 1 tablet (0.4 mg total) every 5 (five) minutes as needed under the tongue for chest pain. 25 tablet 3      Allergies  Allergen Reactions  . Hydrocodone-Acetaminophen Other (See Comments)  . Aspirin Other (See Comments)    Contraindicated - Kidney disease Other reaction(s): Other (See Comments), Other (See Comments) Contraindicated - Kidney disease Contraindicated - Kidney disease  . Ciprofloxacin Other (See Comments)    Other reaction(s): Pt states he could hardly move Other reaction(s): Other (See Comments), Other (See Comments)  Pt states he could hardly move Other reaction(s): Other (See Comments) Pt states he could hardly move Other reaction(s): Pt states he could hardly move Pt states he could hardly move  . Sitagliptin Other (See Comments)    Other reaction(s): Weakness  Other reaction(s): Other (See Comments), Other (See Comments) Weakness Other reaction(s): Other (See Comments) Weakness Other reaction(s): Weakness Weakness     Past Medical History:  Diagnosis Date  . Acute renal failure ([place])   . BPH (benign prostatic hyperplasia)   . Chronic atrial fibrillation ([place])   . Chronic diastolic CHF (congestive heart failure) ([name])    a. echo as above  . Chronic insomnia   . Coronary artery disease, non-occlusive 10/06/2013   a. cath 09/2013: pLAD 40, CTO Diag, ramus 40-->80, OM 50 mRCA 40, med rx  .  Depression   . Diabetes mellitus with complication ([place])   . Diverticulitis of colon   . Dysrhythmia    atrial fibrillation  . Heart murmur    systolic murmur, unspecified  . History of diverticulitis   . Hypercholesterolemia   . Hyperkalemia   . Hyperlipidemia   . Hyperlipidemia   . Hypertension   . Hypokalemia   . Hypomagnesemia   . Malignant melanoma ([place])   . Melanoma ([place])   . Mitral regurgitation    a. echo 09/2013: EF 55-60%, mildly dilated left atrrium, mild to moderate MR/TR  . Sleep apnea in adult     Review of systems:  Otherwise negative.    Physical Exam  Gen: Alert, oriented. Appears stated age.  HEENT: [place]/AT. PERRLA. Lungs: CTA, no wheezes. CV: RR nl S1, S2. Abd: soft, benign, no masses. BS+ Ext: No edema. Pulses 2+    Planned procedures: Proceed with colonoscopy. The patient understands the nature of the planned procedure, indications, risks, alternatives and potential complications including but not limited to bleeding, infection, perforation, damage to internal organs and possible oversedation/side effects from anesthesia. The patient agrees and gives consent to proceed.  Please refer to procedure notes for findings, recommendations and patient disposition/instructions.     Almer Bushey K. Alice Reichert, M.D. Gastroenterology 04/11/2020  8:31 AM

## 2020-04-11 NOTE — Anesthesia Postprocedure Evaluation (Signed)
Anesthesia Post Note  Patient: Todd Valentine  Procedure(s) Performed: COLONOSCOPY WITH PROPOFOL (N/A )  Patient location during evaluation: Endoscopy Anesthesia Type: General Level of consciousness: awake and alert Pain management: pain level controlled Vital Signs Assessment: post-procedure vital signs reviewed and stable Respiratory status: spontaneous breathing, nonlabored ventilation, respiratory function stable and patient connected to nasal cannula oxygen Cardiovascular status: blood pressure returned to baseline and stable Postop Assessment: no apparent nausea or vomiting Anesthetic complications: no   No complications documented.   Last Vitals:  Vitals:   04/11/20 0918 04/11/20 0928  BP: 138/76 (!) 157/85  Pulse:    Resp:    Temp:    SpO2:      Last Pain:  Vitals:   04/11/20 0928  TempSrc:   PainSc: 0-No pain                 Arita Miss

## 2020-04-12 ENCOUNTER — Encounter: Payer: Self-pay | Admitting: Internal Medicine

## 2020-04-12 LAB — SURGICAL PATHOLOGY

## 2020-04-30 ENCOUNTER — Other Ambulatory Visit: Payer: Self-pay | Admitting: Cardiovascular Disease

## 2020-04-30 NOTE — Telephone Encounter (Signed)
19m, 96.6kg, scr 1.47 12/22/19, lovw/gollan 02/28/20

## 2020-05-01 ENCOUNTER — Telehealth: Payer: Self-pay | Admitting: Cardiovascular Disease

## 2020-05-01 DIAGNOSIS — E782 Mixed hyperlipidemia: Secondary | ICD-10-CM

## 2020-05-01 DIAGNOSIS — I25118 Atherosclerotic heart disease of native coronary artery with other forms of angina pectoris: Secondary | ICD-10-CM

## 2020-05-01 MED ORDER — EZETIMIBE 10 MG PO TABS: 10.0000 mg | ORAL_TABLET | Freq: Every day | ORAL | 0 refills | Status: DC

## 2020-05-01 MED ORDER — APIXABAN 5 MG PO TABS
5.0000 mg | ORAL_TABLET | Freq: Two times a day (BID) | ORAL | 1 refills | Status: DC
Start: 2020-05-01 — End: 2020-10-30

## 2020-05-01 NOTE — Telephone Encounter (Signed)
*  STAT* If patient is at the pharmacy, call can be transferred to refill team.   1. Which medications need to be refilled? (please list name of each medication and dose if known)    eliquis 5 mg po BID  Ezetimibe 10 mg po q d   2. Which pharmacy/location (including street and city if local pharmacy) is medication to be sent to?    [facility] [place]   3. Do they need a 30 day or 90 day supply? [place]

## 2020-05-01 NOTE — Addendum Note (Signed)
Addended by: Allean Found on: 05/01/2020 08:36 AM   Modules accepted: Orders

## 2020-05-01 NOTE — Telephone Encounter (Signed)
Refill request for Eliquis.  Requested Prescriptions   Signed Prescriptions Disp Refills   ezetimibe (ZETIA) 10 MG tablet 90 tablet 0    Sig: Take 1 tablet (10 mg total) by mouth daily.    Authorizing Provider: Minna Merritts    Ordering User: Raelene Bott, Gennett Garcia L

## 2020-05-01 NOTE — Telephone Encounter (Signed)
41m, 96.6kg, scr 1.47 12/22/19, lovw/gollan 02/28/20

## 2020-08-01 ENCOUNTER — Other Ambulatory Visit: Payer: Self-pay | Admitting: Cardiovascular Disease

## 2020-08-01 DIAGNOSIS — E782 Mixed hyperlipidemia: Secondary | ICD-10-CM

## 2020-08-01 DIAGNOSIS — I25118 Atherosclerotic heart disease of native coronary artery with other forms of angina pectoris: Secondary | ICD-10-CM

## 2020-09-24 ENCOUNTER — Other Ambulatory Visit: Payer: Self-pay | Admitting: Cardiovascular Disease

## 2020-09-24 NOTE — Telephone Encounter (Signed)
Please schedule overdue 6 month F/U appointment. Thank you! °

## 2020-09-24 NOTE — Telephone Encounter (Signed)
Attempted to schedule.  LMOV to call office.  ° °

## 2020-10-30 ENCOUNTER — Other Ambulatory Visit: Payer: Self-pay | Admitting: Cardiovascular Disease

## 2020-10-30 NOTE — Telephone Encounter (Signed)
Prescription refill request for Eliquis received. Indication:afib Last office visit:gollan 02/28/20 Scr:1.5 11/16/19 Age: 69m Weight:67.6kg

## 2020-10-31 ENCOUNTER — Other Ambulatory Visit: Payer: Self-pay | Admitting: Cardiovascular Disease

## 2020-11-02 ENCOUNTER — Other Ambulatory Visit: Payer: Self-pay | Admitting: Cardiovascular Disease
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
## 2020-11-15 ENCOUNTER — Telehealth: Payer: Self-pay | Admitting: Cardiovascular Disease

## 2020-11-15 NOTE — Telephone Encounter (Signed)
    COVID-19 Pre-Screening Questions V2.:  In the past 7 to 10 days have you had a cough,  shortness of breath, headache, congestion, fever (100 or greater) body aches, chills, sore throat, or sudden loss of taste or sense of smell,? yes Have you been around anyone with known Covid 19, or who is waiting for a Covid test result and is symptomatic? unknown   For patients who are Covid+, pending results or exposed in the last 5 days WITH SYMPTOMS:  1.       Offer to change appointment to a [place] appointment. If the patient declines, contact covering nurse or triage so it can be determined if patient needs to be seen or     rescheduled. (assumes patient calls ahead)   2.       If a patient presents in the lobby, ask them to return to their car and the nurse will call them. Obtain the correct cell phone number and message the covering nurse. The nurse will triage the patient and discuss with the provider to determine appropriate visit plan (In office, MyChart or reschedule).   3.       If it is determined the patient needs to be seen in the office, arrangements will be made for the patient to be seen in a remote room with minimal touches. (Location will be         specific to each [facility] site).               If you have any concerns/questions about symptoms patients report during screening (either on the phone or at threshold),                Send a secure chat with the patient's chart to the APP doing preop clearances for that day.              If the decision is made to see the patient, document the following in the appt. note:  "cleared by APP's initials".                        If an APP is not available contact a member of the leadership team.   Patients who are Covid+ are allowed in the office when the following are met: No symptoms or improving symptoms  At least 5 days post positive test

## 2020-11-15 NOTE — Telephone Encounter (Signed)
Per PA Bhagat patient ok to come to visit but advised to take at home covid test before appt due to symptoms.   Patient daughter Todd Valentine on [place] aware and will assist with testing .   Will come to visit if negative and call in the morning if positive result.

## 2020-11-16 ENCOUNTER — Ambulatory Visit: Payer: Medicare Other | Admitting: Cardiovascular Disease

## 2020-11-16 DIAGNOSIS — E118 Type 2 diabetes mellitus with unspecified complications: Secondary | ICD-10-CM

## 2020-11-16 DIAGNOSIS — I25118 Atherosclerotic heart disease of native coronary artery with other forms of angina pectoris: Secondary | ICD-10-CM

## 2020-11-16 DIAGNOSIS — R0602 Shortness of breath: Secondary | ICD-10-CM

## 2020-11-16 DIAGNOSIS — I10 Essential (primary) hypertension: Secondary | ICD-10-CM

## 2020-11-16 DIAGNOSIS — E782 Mixed hyperlipidemia: Secondary | ICD-10-CM

## 2020-11-16 DIAGNOSIS — I482 Chronic atrial fibrillation, unspecified: Secondary | ICD-10-CM

## 2020-11-16 DIAGNOSIS — I5032 Chronic diastolic (congestive) heart failure: Secondary | ICD-10-CM

## 2020-11-16 DIAGNOSIS — R600 Localized edema: Secondary | ICD-10-CM

## 2020-11-16 NOTE — Telephone Encounter (Signed)
Patient daughter calling to discuss visit.  Patient testing was positive for covid at home last night and patient continues to be symptomatic.   Offered virtual visit or call back from nurse but patient daughter declined stating she would rather wait and discuss with Dr. Rockey Situ .   Rescheduled visit to 10-17

## 2020-11-22 ENCOUNTER — Other Ambulatory Visit: Payer: Self-pay | Admitting: Cardiovascular Disease

## 2020-12-10 ENCOUNTER — Other Ambulatory Visit: Payer: Self-pay

## 2020-12-10 ENCOUNTER — Encounter: Payer: Self-pay | Admitting: Cardiovascular Disease

## 2020-12-10 ENCOUNTER — Ambulatory Visit (INDEPENDENT_AMBULATORY_CARE_PROVIDER_SITE_OTHER): Payer: Medicare Other | Admitting: Cardiovascular Disease

## 2020-12-10 VITALS — BP 130/78 | HR 58 | Ht 68.0 in | Wt 211.1 lb

## 2020-12-10 DIAGNOSIS — R0602 Shortness of breath: Secondary | ICD-10-CM

## 2020-12-10 DIAGNOSIS — I482 Chronic atrial fibrillation, unspecified: Secondary | ICD-10-CM

## 2020-12-10 DIAGNOSIS — E118 Type 2 diabetes mellitus with unspecified complications: Secondary | ICD-10-CM | POA: Diagnosis not present

## 2020-12-10 DIAGNOSIS — I5032 Chronic diastolic (congestive) heart failure: Secondary | ICD-10-CM

## 2020-12-10 DIAGNOSIS — I10 Essential (primary) hypertension: Secondary | ICD-10-CM

## 2020-12-10 DIAGNOSIS — I25118 Atherosclerotic heart disease of native coronary artery with other forms of angina pectoris: Secondary | ICD-10-CM

## 2020-12-10 DIAGNOSIS — E782 Mixed hyperlipidemia: Secondary | ICD-10-CM

## 2020-12-10 MED ORDER — POTASSIUM CHLORIDE ER 20 MEQ PO TBCR: 20.0000 meq | EXTENDED_RELEASE_TABLET | Freq: Every day | ORAL | 3 refills | Status: DC

## 2020-12-10 MED ORDER — CLONIDINE HCL 0.1 MG PO TABS: 0.1000 mg | ORAL_TABLET | Freq: Three times a day (TID) | ORAL | 3 refills | Status: DC

## 2020-12-10 MED ORDER — ROSUVASTATIN CALCIUM 5 MG PO TABS: 5.0000 mg | ORAL_TABLET | Freq: Every day | ORAL | 3 refills | Status: DC

## 2020-12-10 MED ORDER — CARVEDILOL 12.5 MG PO TABS: 12.5000 mg | ORAL_TABLET | Freq: Two times a day (BID) | ORAL | 1 refills | Status: DC

## 2020-12-10 MED ORDER — NITROGLYCERIN 0.4 MG SL SUBL: 0.4000 mg | SUBLINGUAL_TABLET | SUBLINGUAL | 3 refills | Status: DC | PRN

## 2020-12-10 MED ORDER — LOSARTAN POTASSIUM 100 MG PO TABS: 100.0000 mg | ORAL_TABLET | Freq: Every day | ORAL | 3 refills | Status: DC

## 2020-12-10 MED ORDER — APIXABAN 5 MG PO TABS: 5.0000 mg | ORAL_TABLET | Freq: Two times a day (BID) | ORAL | 3 refills | Status: DC

## 2020-12-10 MED ORDER — DILTIAZEM HCL ER COATED BEADS 120 MG PO CP24: 120.0000 mg | ORAL_CAPSULE | Freq: Every day | ORAL | 3 refills | Status: DC

## 2020-12-10 MED ORDER — EZETIMIBE 10 MG PO TABS: 10.0000 mg | ORAL_TABLET | Freq: Every day | ORAL | 3 refills | Status: DC

## 2020-12-10 MED ORDER — FUROSEMIDE 20 MG PO TABS: 20.0000 mg | ORAL_TABLET | Freq: Every day | ORAL | 3 refills | Status: DC

## 2020-12-10 MED ORDER — FARXIGA 10 MG PO TABS: 10.0000 mg | ORAL_TABLET | Freq: Every day | ORAL | 3 refills | Status: DC

## 2020-12-10 NOTE — Patient Instructions (Addendum)
Medication Instructions:   Clonidine  0.1 mg in am 0.1 mg at noon 0.2 mg in the pm  If you need a refill on your cardiac medications before your next appointment, please call your pharmacy.   Lab work: No new labs needed  Testing/Procedures: No new testing needed  Follow-Up: At [facility], you and your health needs are our priority.  As part of our continuing mission to provide you with exceptional heart care, we have created designated Provider Care Teams.  These Care Teams include your primary Cardiologist (physician) and Advanced Practice Providers (APPs -  Physician Assistants and Nurse Practitioners) who all work together to provide you with the care you need, when you need it.  You will need a follow up appointment in 12 months  Providers on your designated Care Team:   Murray Hodgkins, NP Christell Faith, PA-C Marrianne Mood, [place]- Cadence Markle, [place]  COVID-19 Vaccine Information can be found at: ShippingScam.co.uk For questions related to vaccine distribution or appointments, please email [email or web address] .[email or web address] or call [phone].

## 2020-12-10 NOTE — Progress Notes (Signed)
Cardiology Office Note  Date:  12/10/2020   ID:  Todd Valentine, DOB 1941-02-14, MRN [id number]  PCP:  Shelda Jakes, MD   Chief Complaint  Patient presents with   6 month follow up     "Doing well." Medications reviewed by the patient verbally.     HPI:  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 Sedentary other days Previously reported drop in blood pressure late morning, late at night, with orthostasis symptoms, blood pressure systolic 211 History of labile pressures 941 to 740 systolic at home  Medication intolerances, spironolactone, malaise, dizziness  Current regiment In the a.m., takes carvedilol 12.5 mg daily clonidine 0.1 in Am, diltiazem ER 120 mg daily Lasix 20 (not sat or Sunday) Clonidine 0.1 at noon In the PM clonidine 0.2, carvedilol 12.5,  tamsulosin,  losartan 100  Farxiga   Labs: A1c 8.4 Total cholesterol 109 LDL 49 No recent BMP, prior creatinine 1.3  EKG personally reviewed by myself on todays visit Atrial fibrillation right bundle branch block rate 58  Other past medical history reviewed Hospital admission 05/08/16 for PNA,sepsis, atrial fib with RVR On bipap For many days, ABX. Declined ventilator if needed acute kidney injury/ATN,  requiring CRRT Diltiazem dose increased at d/c up to 240 daily D/c on pradaxa (was going to change to eliquis) Off lisinopril/HCTZ,  metformin  Labs at d/c  CR 1.64, BUN 33  total cholesterol well controlled, 130 or less    hospital admission 10/02/2013 with discharge August 12. He presented with shortness of breath, Atrial fibrillation with RVR. Echocardiogram showed normal ejection fraction. Symptoms improved with diuresis.   Echocardiogram 10/02/2013 showing ejection fraction 55-60%, mildly dilated left atrium, mild to moderate MR and TR   He had cardiac catheterization 10/06/2013 showing occluded diagonal vessel, 40% proximal LAD disease, bifurcating marginal branch/ramus. Ramus had 60% followed by 80% disease, OM with 50% mid disease at least. RCA with 40% mid disease. Medical management recommended    PMH:   has a past medical history of Acute renal failure ([place]), BPH (benign prostatic hyperplasia), Chronic atrial fibrillation ([name]), Chronic diastolic CHF (congestive heart failure) ([place]), Chronic insomnia, Coronary artery disease, non-occlusive (10/06/2013), Depression, Diabetes mellitus with complication ([place]), Diverticulitis of colon, Dysrhythmia, Heart murmur, History of diverticulitis, Hypercholesterolemia, Hyperkalemia, Hyperlipidemia, Hyperlipidemia, Hypertension, Hypokalemia, Hypomagnesemia, Malignant melanoma ([place]), Melanoma ([place]), Mitral regurgitation, and Sleep apnea in adult.  PSH:    Past Surgical History:  Procedure Laterality Date   APPENDECTOMY     CARDIAC CATHETERIZATION  10/06/2013   COLONOSCOPY     COLONOSCOPY WITH PROPOFOL N/A 11/23/2014   Procedure: COLONOSCOPY WITH PROPOFOL;  Surgeon: Manya Silvas, MD;  Location: [facility] ENDOSCOPY;  Service: Endoscopy;  Laterality: N/A;   COLONOSCOPY WITH PROPOFOL N/A 04/11/2020   Procedure: COLONOSCOPY WITH PROPOFOL;  Surgeon: Toledo, Benay Pike, MD;  Location: [facility] ENDOSCOPY;  Service: Gastroenterology;  Laterality: N/A;  DM  ELIQUIS   MOHS SURGERY     removal tubular adenoma  Current Outpatient Medications on File Prior to Visit  Medication Sig  Dispense Refill   acetaminophen (TYLENOL) 500 MG tablet Take 1,000 mg by mouth every 6 (six) hours as needed for mild pain, fever or headache.      famotidine (PEPCID) 20 MG tablet Take 1 tablet (20 mg total) by mouth at bedtime.     glipiZIDE (GLUCOTROL XL) 5 MG 24 hr tablet Take 5 mg by mouth daily with breakfast.     linagliptin (TRADJENTA) 5 MG TABS tablet Take 5 mg by mouth daily.     magnesium oxide (MAG-OX) 400 (241.3 Mg) MG tablet Take 1 tablet by mouth daily.     tamsulosin (FLOMAX) 0.4 MG CAPS capsule Take 0.4 mg by mouth daily.      traZODone (DESYREL) 50 MG tablet Take 50 mg by mouth at bedtime as needed for sleep.     glimepiride (AMARYL) 4 MG tablet Take 4 mg by mouth 2 (two) times daily.  (Patient not taking: Reported on 12/10/2020)     sildenafil (REVATIO) 20 MG tablet Take 20 mg by mouth 3 (three) times daily. (Patient not taking: Reported on 12/10/2020)     No current facility-administered medications on file prior to visit.    Allergies:   Hydrocodone-acetaminophen, Aspirin, Ciprofloxacin, and Sitagliptin   Social History:  The patient  reports that he has quit smoking. He has never used smokeless tobacco. He reports current alcohol use. He reports that he does not use drugs.   Family History:   Family history is unknown by patient.    Review of Systems: Review of Systems  Constitutional: Negative.   HENT: Negative.    Respiratory: Negative.    Cardiovascular: Negative.   Gastrointestinal: Negative.   Musculoskeletal: Negative.        Gait instability  Neurological: Negative.   Psychiatric/Behavioral: Negative.    All other systems reviewed and are negative.  PHYSICAL EXAM: VS:  BP 130/78 (BP Location: Left Arm, Patient Position: Sitting, Cuff Size: Normal)   Pulse (!) 58   Ht 5\' 8"  (1.727 m)   Wt 211 lb 2 oz (95.8 kg)   SpO2 96%   BMI 32.10 kg/m  , BMI Body mass index is 32.1 kg/m. Constitutional:  oriented to person, place, and time. No distress.   HENT:  Head: Grossly normal Eyes:  no discharge. No scleral icterus.  Neck: No JVD, no carotid bruits  Cardiovascular: Regular rate and rhythm, no murmurs appreciated Pulmonary/Chest: Clear to auscultation bilaterally, no wheezes or rails Abdominal: Soft.  no distension.  no tenderness.  Musculoskeletal: Normal range of motion Neurological:  normal muscle tone. Coordination normal. No atrophy Skin: Skin warm and dry Psychiatric: normal affect, pleasant  Recent Labs: No results found for requested labs within last 8760 hours.    Lipid Panel Lab Results  Component Value Date   CHOL 97 (L) 08/25/2017   HDL 30 (L) 08/25/2017   LDLCALC 41 08/25/2017   TRIG 128 08/25/2017    Wt Readings from Last 3 Encounters:  12/10/20 211 lb 2 oz (95.8 kg)  04/11/20 208 lb (94.3 kg)  02/28/20 213 lb (96.6 kg)     ASSESSMENT AND PLAN:  Chronic atrial fibrillation ([name]) - Plan: EKG 12-Lead Rate controlled, on eliquis On, coreg 12.5 BID Rate controlled, no sx  Essential hypertension Continue current regimen as detailed above Extra clonidine in the p.m. 0.2  chronic diastolic CHF (congestive heart failure) ([name]) - Stable renal function On Lasix 5 days a week,  not on Saturday Sunday  Mixed hyperlipidemia  Cholesterol is at goal on the current lipid regimen. No changes to the medications were made.  Type 2 diabetes mellitus with complication, without long-term current use of insulin ([place]) - Plan: EKG 12-Lead HBA1c 7.4 We have encouraged continued exercise, careful diet management in an effort to lose weight.  Acute on chronic renal failure Stable numbers Followed by nephrology   Total encounter time more than 25 minutes  Greater than 50% was spent in counseling and coordination of care with the patient     Orders Placed This Encounter  Procedures   EKG 12-Lead      Signed, Esmond Plants, M.D., Ph.D. 12/10/2020  [place],  [place]

## 2020-12-31 ENCOUNTER — Other Ambulatory Visit: Payer: Self-pay | Admitting: Cardiovascular Disease

## 2020-12-31 NOTE — Telephone Encounter (Signed)
*  STAT* If patient is at the pharmacy, call can be transferred to refill team.   1. Which medications need to be refilled? (please list name of each medication and dose if known) Diltiazem and Clonidine  2. Which pharmacy/location (including street and city if local pharmacy) is medication to be sent to? [facility] [place]  3. Do they need a 30 day or 90 day supply? [place]

## 2021-02-14 ENCOUNTER — Other Ambulatory Visit: Payer: Self-pay | Admitting: Cardiovascular Disease

## 2021-02-22 ENCOUNTER — Encounter: Payer: Self-pay | Admitting: Cardiovascular Disease

## 2021-03-14 ENCOUNTER — Other Ambulatory Visit: Payer: Self-pay | Admitting: *Deleted

## 2021-03-14 MED ORDER — APIXABAN 5 MG PO TABS: 5.0000 mg | ORAL_TABLET | Freq: Two times a day (BID) | ORAL | 3 refills | Status: DC

## 2021-03-14 NOTE — Telephone Encounter (Signed)
Patient dropped off patient assistance information  Placed in nurse box

## 2021-03-14 NOTE — Telephone Encounter (Signed)
Spoke with patients daughter to review application for assistance and requirements. Will send information we have but that he may need updated documents from this year in order to be approved. She was appreciative for the call with no further questions at this time.

## 2021-03-14 NOTE — Telephone Encounter (Signed)
Application completed and faxed to company 

## 2021-03-27 ENCOUNTER — Telehealth: Payer: Self-pay

## 2021-03-27 NOTE — Telephone Encounter (Signed)
Fax received from [place] with Eliquis  "We regret to inform you that your patient is not eligible to receive Eliquis for the following reason: The patient's household income id over our current eligibility criteria Document of 3% ou-of-pocket prescription expense, based on household adjusted gross income, not met.  Application case # [id number] Denial letter placed with pt's application in file cabinet.

## 2021-07-18 ENCOUNTER — Encounter: Payer: Self-pay | Admitting: Cardiovascular Disease

## 2021-09-30 ENCOUNTER — Other Ambulatory Visit: Payer: Self-pay | Admitting: Cardiovascular Disease

## 2021-09-30 NOTE — Telephone Encounter (Signed)
Hi,  Could you schedule this patient a 12 month follow up? The patient was last seen by Dr. Rockey Situ on 12/10/2020.  Thank you so much, Todd Valentine.

## 2021-10-03 ENCOUNTER — Encounter: Payer: Self-pay | Admitting: Hematology & Oncology

## 2021-10-03 ENCOUNTER — Inpatient Hospital Stay (HOSPITAL_BASED_OUTPATIENT_CLINIC_OR_DEPARTMENT_OTHER): Payer: Medicare Other | Admitting: Hematology & Oncology

## 2021-10-03 ENCOUNTER — Inpatient Hospital Stay: Payer: Medicare Other | Attending: Hematology & Oncology

## 2021-10-03 VITALS — BP 159/79 | HR 59 | Temp 97.4°F | Resp 20 | Ht 68.5 in | Wt 209.0 lb

## 2021-10-03 DIAGNOSIS — Z7901 Long term (current) use of anticoagulants: Secondary | ICD-10-CM

## 2021-10-03 DIAGNOSIS — I482 Chronic atrial fibrillation, unspecified: Secondary | ICD-10-CM

## 2021-10-03 DIAGNOSIS — E119 Type 2 diabetes mellitus without complications: Secondary | ICD-10-CM

## 2021-10-03 DIAGNOSIS — D72819 Decreased white blood cell count, unspecified: Secondary | ICD-10-CM

## 2021-10-03 DIAGNOSIS — D696 Thrombocytopenia, unspecified: Secondary | ICD-10-CM

## 2021-10-03 LAB — CBC WITH DIFFERENTIAL (CANCER CENTER ONLY)
Abs Immature Granulocytes: 0.02 10*3/uL (ref 0.00–0.07)
Basophils Absolute: 0 10*3/uL (ref 0.0–0.1)
Basophils Relative: 1 %
Eosinophils Absolute: 0.2 10*3/uL (ref 0.0–0.5)
Eosinophils Relative: 3 %
HCT: 48.1 % (ref 39.0–52.0)
Hemoglobin: 15.9 g/dL (ref 13.0–17.0)
Immature Granulocytes: 0 %
Lymphocytes Relative: 16 %
Lymphs Abs: 1.1 10*3/uL (ref 0.7–4.0)
MCH: 29.8 pg (ref 26.0–34.0)
MCHC: 33.1 g/dL (ref 30.0–36.0)
MCV: 90.2 fL (ref 80.0–100.0)
Monocytes Absolute: 0.6 10*3/uL (ref 0.1–1.0)
Monocytes Relative: 9 %
Neutro Abs: 4.7 10*3/uL (ref 1.7–7.7)
Neutrophils Relative %: 71 %
Platelet Count: 132 10*3/uL — ABNORMAL LOW (ref 150–400)
RBC: 5.33 MIL/uL (ref 4.22–5.81)
RDW: 13.1 % (ref 11.5–15.5)
WBC Count: 6.6 10*3/uL (ref 4.0–10.5)
nRBC: 0 % (ref 0.0–0.2)

## 2021-10-03 LAB — SAVE SMEAR(SSMR), FOR PROVIDER SLIDE REVIEW

## 2021-10-03 NOTE — Progress Notes (Signed)
Referral MD  Reason for Referral: Thrombocytopenia-mild  Chief Complaint  Patient presents with   New Patient (Initial Visit)    "My blood platelets are low."  : My platelets are low.  HPI: [name] is a very nice 81 year old white male.  He lives in [place].  He comes in with his daughter.  He is a longtime [place] native.  He worked in the [employer].  He has been retired for 15 years.  He still does work part-time.  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  He does have multiple health issues.  He is on multiple medications.  He was found recently to have some low platelets.  He had lab work done back in May.  This showed a platelet count of 132,000.  His white cell count was 7.8.  Hemoglobin 16.1.  He had a normal white cell differential.  Had normal MCV.  Going back through the records, back in 2020, his platelet count 208,000.  He has had lower platelets than this.  Back in 2018, his platelet count was 96,000.  His white count was 2.8.  His hemoglobin is 13.5.  As far as he knows, he has had no new medications given to him.  He is on quite a few medications.  He, again has had numerous health issues.  He is on Eliquis for long-term atrial fibrillation.  He has diabetes that is not insulin dependent.  He has had no obvious occupational exposures.  He smoked but stopped back in 1984.  He does have some alcohol use.  There is no obvious family history of blood problems as far as he knows.  He has had no problems with his appetite.  He is not a vegetarian.  Overall, I would say his performance status is probably ECOG 1.  Past Medical History:  Diagnosis Date   Acute renal failure ([name])    BPH (benign prostatic hyperplasia)    Chronic atrial fibrillation ([name])    Chronic diastolic CHF (congestive heart failure) ([place])    a. echo as above   Chronic insomnia    Coronary artery disease, non-occlusive 10/06/2013   a. cath 09/2013: pLAD 40, CTO Diag, ramus 40-->80,  OM 19 mRCA 40, med rx   Depression    Diabetes mellitus with complication ([name])    Diverticulitis of colon    Dysrhythmia    atrial fibrillation   Heart murmur    systolic murmur, unspecified   History of diverticulitis    Hypercholesterolemia    Hyperkalemia    Hyperlipidemia    Hyperlipidemia    Hypertension    Hypokalemia    Hypomagnesemia    Malignant melanoma ([place])    Melanoma ([name])    Mitral regurgitation    a. echo 09/2013: EF 55-60%, mildly dilated left atrrium, mild to moderate MR/TR   Sleep apnea in adult   :   Past Surgical History:  Procedure Laterality Date   APPENDECTOMY     CARDIAC CATHETERIZATION  10/06/2013   COLONOSCOPY     COLONOSCOPY WITH PROPOFOL N/A 11/23/2014   Procedure: COLONOSCOPY WITH PROPOFOL;  Surgeon: Manya Silvas, MD;  [facility]COPY;  Service: Endoscopy;  Laterality: N/A;   COLONOSCOPY WITH PROPOFOL N/A 04/11/2020   Procedure: COLONOSCOPY WITH PROPOFOL;  Surgeon: Toledo, Benay Pike, MD;  Location: [facility] ENDOSCOPY;  Service: Gastroenterology;  Laterality: N/A;  DM  ELIQUIS   MOHS SURGERY     removal tubular adenoma    :   Current  Outpatient Medications:    acetaminophen (TYLENOL) 500 MG tablet, Take 1,000 mg by mouth every 6 (six) hours as needed for mild pain, fever or headache. , Disp: , Rfl:    apixaban (ELIQUIS) 5 MG TABS tablet, Take 1 tablet (5 mg total) by mouth 2 (two) times daily., Disp: 180 tablet, Rfl: 3   carvedilol (COREG) 12.5 MG tablet, Take 1 tablet (12.5 mg total) by mouth 2 (two) times daily with a meal., Disp: 180 tablet, Rfl: 1   cloNIDine (CATAPRES) 0.1 MG tablet, Take 1 tablet (0.1 mg total) by mouth 3 (three) times daily. 0.1 mg in am, 0.1 mg at noon, 0.2 mg (two tabs) in the pm, Disp: 360 tablet, Rfl: 3   diltiazem (CARDIZEM CD) 120 MG 24 hr capsule, Take 1 capsule (120 mg total) by mouth daily., Disp: 90 capsule, Rfl: 0   ezetimibe (ZETIA) 10 MG tablet, Take 1 tablet (10 mg total) by mouth daily., Disp: 90  tablet, Rfl: 3   famotidine (PEPCID) 20 MG tablet, Take 1 tablet (20 mg total) by mouth at bedtime., Disp: , Rfl:    FARXIGA 10 MG TABS tablet, Take 1 tablet (10 mg total) by mouth daily., Disp: 90 tablet, Rfl: 3   furosemide (LASIX) 20 MG tablet, Take 1 tablet (20 mg total) by mouth daily. Except on Saturdays and Sundays, Disp: 90 tablet, Rfl: 3   glipiZIDE (GLUCOTROL XL) 5 MG 24 hr tablet, Take 5 mg by mouth daily with breakfast., Disp: , Rfl:    linagliptin (TRADJENTA) 5 MG TABS tablet, Take 5 mg by mouth daily., Disp: , Rfl:    losartan (COZAAR) 100 MG tablet, Take 1 tablet (100 mg total) by mouth daily., Disp: 90 tablet, Rfl: 3   magnesium oxide (MAG-OX) 400 (241.3 Mg) MG tablet, Take 1 tablet by mouth daily., Disp: , Rfl:    Potassium Chloride ER 20 MEQ TBCR, Take 20 mEq by mouth daily., Disp: 90 tablet, Rfl: 3   rosuvastatin (CRESTOR) 5 MG tablet, Take 1 tablet by mouth once daily, Disp: 90 tablet, Rfl: 0   tamsulosin (FLOMAX) 0.4 MG CAPS capsule, Take 0.4 mg by mouth daily. , Disp: , Rfl:    traZODone (DESYREL) 50 MG tablet, Take 50 mg by mouth at bedtime as needed for sleep., Disp: , Rfl:    nitroGLYCERIN (NITROSTAT) 0.4 MG SL tablet, Place 1 tablet (0.4 mg total) under the tongue every 5 (five) minutes as needed for chest pain. (Patient not taking: Reported on 10/03/2021), Disp: 25 tablet, Rfl: 3:  :   Allergies  Allergen Reactions   Hydrocodone-Acetaminophen Other (See Comments)   Aspirin Other (See Comments)    Contraindicated - Kidney disease Other reaction(s): Other (See Comments), Other (See Comments) Contraindicated - Kidney disease Contraindicated - Kidney disease   Ciprofloxacin Other (See Comments)    Other reaction(s): Pt states he could hardly move Other reaction(s): Other (See Comments), Other (See Comments) Pt states he could hardly move Other reaction(s): Other (See Comments) Pt states he could hardly move Other reaction(s): Pt states he could hardly move Pt  states he could hardly move   Sitagliptin Other (See Comments)    Other reaction(s): Weakness  Other reaction(s): Other (See Comments), Other (See Comments) Weakness Other reaction(s): Other (See Comments) Weakness Other reaction(s): Weakness Weakness  :   Family History  Family history unknown: Yes  :   Social History   Socioeconomic History   Marital status: Widowed    Spouse name: Not on file  Number of children: Not on file   Years of education: Not on file   Highest education level: Not on file  Occupational History   Not on file  Tobacco Use   Smoking status: Former    Packs/day: 3.00    Years: 30.00    Total pack years: 90.00    Types: Cigarettes    Quit date: 57    Years since quitting: 34.6   Smokeless tobacco: Never  Vaping Use   Vaping Use: Never used  Substance and Sexual Activity   Alcohol use: Yes    Comment: "occassionally have a few drinks"   Drug use: Not Currently   Sexual activity: Not on file  Other Topics Concern   Not on file  Social History Narrative   Not on file   Social Determinants of Health   Financial Resource Strain: Not on file  Food Insecurity: Not on file  Transportation Needs: Not on file  Physical Activity: Not on file  Stress: Not on file  Social Connections: Not on file  Intimate Partner Violence: Not on file  :  Review of Systems  Constitutional: Negative.   HENT: Negative.    Eyes: Negative.   Respiratory: Negative.    Cardiovascular: Negative.   Gastrointestinal: Negative.   Genitourinary: Negative.   Musculoskeletal: Negative.   Skin: Negative.   Neurological: Negative.   Endo/Heme/Allergies:  Bruises/bleeds easily.  Psychiatric/Behavioral: Negative.       Exam: His vital signs show temperature of 97.4.  Pulse 59.  Blood pressure 159/79.  Weight is 209 pounds.  '@IPVITALS'$ @ Physical Exam Vitals reviewed.  HENT:     Head: Normocephalic and atraumatic.  Eyes:     Pupils: Pupils are equal,  round, and reactive to light.  Cardiovascular:     Rate and Rhythm: Normal rate and regular rhythm.     Heart sounds: Normal heart sounds.     Comments: Cardiac exam is irregular rate and rhythm consistent with atrial fibrillation.  He has a 2/6 systolic ejection murmur. Pulmonary:     Effort: Pulmonary effort is normal.     Breath sounds: Normal breath sounds.  Abdominal:     General: Bowel sounds are normal.     Palpations: Abdomen is soft.     Comments: Abdominal exam shows a soft abdomen.  He is slightly obese.  There is no fluid wave.  He has no abdominal mass.  He has good bowel sounds.  There is no palpable liver or spleen tip.  Musculoskeletal:        General: No tenderness or deformity. Normal range of motion.     Cervical back: Normal range of motion.     Comments: Extremities does show some arthritic changes.  He has some slight swelling in the lower extremities.  Lymphadenopathy:     Cervical: No cervical adenopathy.  Skin:    General: Skin is warm and dry.     Findings: No erythema or rash.     Comments: He does have some ecchymoses on his forearms.  There is also some actinic keratoses and seborrheic keratoses.  Neurological:     Mental Status: He is alert and oriented to person, place, and time.  Psychiatric:        Behavior: Behavior normal.        Thought Content: Thought content normal.        Judgment: Judgment normal.     Recent Labs    10/03/21 1346  WBC 6.6  HGB 15.9  HCT 48.1  PLT 132*   No results for input(s): "NA", "K", "CL", "CO2", "GLUCOSE", "BUN", "CREATININE", "CALCIUM" in the last 72 hours.  Blood smear review: Normochromic and normocytic population of red blood cells.  He has no nucleated red blood cells.  There is no teardrop cells.  I see no schistocytes.  There is no rouleaux formation.  Platelets are slightly decreased in number.  Platelets are well granulated.  I do not see any large platelets.  White blood cells are mature.  He has no  immature myeloid or lymphoid cells.  I see no hypersegmented polys.  Pathology: None    Assessment and Plan: [name] is a very nice 81 year old white male.  He has mild thrombocytopenia.  I had believe that this is from medications or possibly from exogenous factors.  I do not think that there is a bone marrow issue.  I realize that he is 81 years old.  However, his blood smear certainly does not look consistent with any type of myelodysplasia.  I cannot find anything on his physical exam that would suggest an underlying hematologic or oncologic issue.  I cannot feel any adenopathy.  There is no splenomegaly.  I do not see anything that would suggest vitamin B12 deficiency.  Again, he is on a lot of medications.  These medicines can interact that could cause his platelets to go down a little bit.  Also, there may be some alcohol use that could be causing the low platelets.  At this point, I really do not think his platelets are low enough that we have to make any adjustments with his medicines.  I really think that we can just follow along.  I would like to see him back in 3 months.  At that time, we will see if there is any change in his platelet count that would suggest an underlying problem that we will have to do a bone marrow test for.  He is very nice.  I enjoyed talking to he and his daughter.  They are both incredibly knowledgeable and very eloquent.

## 2021-10-22 ENCOUNTER — Other Ambulatory Visit: Payer: Self-pay | Admitting: Cardiovascular Disease

## 2021-10-22 DIAGNOSIS — E782 Mixed hyperlipidemia: Secondary | ICD-10-CM

## 2021-10-22 DIAGNOSIS — I25118 Atherosclerotic heart disease of native coronary artery with other forms of angina pectoris: Secondary | ICD-10-CM

## 2021-11-07 ENCOUNTER — Telehealth: Payer: Self-pay | Admitting: Cardiovascular Disease

## 2021-11-07 ENCOUNTER — Inpatient Hospital Stay
Admission: EM | Admit: 2021-11-07 | Discharge: 2021-11-13 | DRG: 304 | Disposition: A | Discharge status: Home / Self Care | Payer: Medicare Other | Attending: Osteopathic Medicine | Admitting: Osteopathic Medicine

## 2021-11-07 ENCOUNTER — Emergency Department: Payer: Medicare Other

## 2021-11-07 ENCOUNTER — Other Ambulatory Visit: Payer: Self-pay

## 2021-11-07 DIAGNOSIS — Z885 Allergy status to narcotic agent status: Secondary | ICD-10-CM

## 2021-11-07 DIAGNOSIS — Z87891 Personal history of nicotine dependence: Secondary | ICD-10-CM

## 2021-11-07 DIAGNOSIS — E785 Hyperlipidemia, unspecified: Secondary | ICD-10-CM | POA: Diagnosis present

## 2021-11-07 DIAGNOSIS — E118 Type 2 diabetes mellitus with unspecified complications: Secondary | ICD-10-CM | POA: Diagnosis present

## 2021-11-07 DIAGNOSIS — I160 Hypertensive urgency: Principal | ICD-10-CM | POA: Diagnosis present

## 2021-11-07 DIAGNOSIS — R001 Bradycardia, unspecified: Secondary | ICD-10-CM | POA: Diagnosis present

## 2021-11-07 DIAGNOSIS — F32A Depression, unspecified: Secondary | ICD-10-CM | POA: Diagnosis present

## 2021-11-07 DIAGNOSIS — E7800 Pure hypercholesterolemia, unspecified: Secondary | ICD-10-CM | POA: Diagnosis present

## 2021-11-07 DIAGNOSIS — I5033 Acute on chronic diastolic (congestive) heart failure: Secondary | ICD-10-CM | POA: Diagnosis not present

## 2021-11-07 DIAGNOSIS — I701 Atherosclerosis of renal artery: Secondary | ICD-10-CM | POA: Diagnosis present

## 2021-11-07 DIAGNOSIS — I4892 Unspecified atrial flutter: Secondary | ICD-10-CM | POA: Diagnosis not present

## 2021-11-07 DIAGNOSIS — I2510 Atherosclerotic heart disease of native coronary artery without angina pectoris: Secondary | ICD-10-CM | POA: Diagnosis present

## 2021-11-07 DIAGNOSIS — I4821 Permanent atrial fibrillation: Secondary | ICD-10-CM | POA: Diagnosis not present

## 2021-11-07 DIAGNOSIS — Z7984 Long term (current) use of oral hypoglycemic drugs: Secondary | ICD-10-CM

## 2021-11-07 DIAGNOSIS — I159 Secondary hypertension, unspecified: Principal | ICD-10-CM

## 2021-11-07 DIAGNOSIS — D696 Thrombocytopenia, unspecified: Secondary | ICD-10-CM | POA: Diagnosis present

## 2021-11-07 DIAGNOSIS — E1122 Type 2 diabetes mellitus with diabetic chronic kidney disease: Secondary | ICD-10-CM | POA: Diagnosis not present

## 2021-11-07 DIAGNOSIS — K551 Chronic vascular disorders of intestine: Secondary | ICD-10-CM | POA: Diagnosis present

## 2021-11-07 DIAGNOSIS — Z7901 Long term (current) use of anticoagulants: Secondary | ICD-10-CM

## 2021-11-07 DIAGNOSIS — Z888 Allergy status to other drugs, medicaments and biological substances status: Secondary | ICD-10-CM

## 2021-11-07 DIAGNOSIS — F5104 Psychophysiologic insomnia: Secondary | ICD-10-CM | POA: Diagnosis present

## 2021-11-07 DIAGNOSIS — Z85820 Personal history of malignant melanoma of skin: Secondary | ICD-10-CM

## 2021-11-07 DIAGNOSIS — F419 Anxiety disorder, unspecified: Secondary | ICD-10-CM | POA: Diagnosis present

## 2021-11-07 DIAGNOSIS — I081 Rheumatic disorders of both mitral and tricuspid valves: Secondary | ICD-10-CM | POA: Diagnosis present

## 2021-11-07 DIAGNOSIS — Z79899 Other long term (current) drug therapy: Secondary | ICD-10-CM

## 2021-11-07 DIAGNOSIS — N1832 Chronic kidney disease, stage 3b: Secondary | ICD-10-CM | POA: Diagnosis present

## 2021-11-07 DIAGNOSIS — I150 Renovascular hypertension: Secondary | ICD-10-CM | POA: Diagnosis present

## 2021-11-07 DIAGNOSIS — E1151 Type 2 diabetes mellitus with diabetic peripheral angiopathy without gangrene: Secondary | ICD-10-CM | POA: Diagnosis present

## 2021-11-07 DIAGNOSIS — G4733 Obstructive sleep apnea (adult) (pediatric): Secondary | ICD-10-CM | POA: Diagnosis present

## 2021-11-07 DIAGNOSIS — N400 Enlarged prostate without lower urinary tract symptoms: Secondary | ICD-10-CM | POA: Diagnosis present

## 2021-11-07 DIAGNOSIS — Z6830 Body mass index (BMI) 30.0-30.9, adult: Secondary | ICD-10-CM

## 2021-11-07 DIAGNOSIS — E669 Obesity, unspecified: Secondary | ICD-10-CM | POA: Diagnosis present

## 2021-11-07 LAB — COMPREHENSIVE METABOLIC PANEL
ALT: 25 U/L (ref 0–44)
AST: 23 U/L (ref 15–41)
Albumin: 4 g/dL (ref 3.5–5.0)
Alkaline Phosphatase: 101 U/L (ref 38–126)
Anion gap: 8 (ref 5–15)
BUN: 31 mg/dL — ABNORMAL HIGH (ref 8–23)
CO2: 26 mmol/L (ref 22–32)
Calcium: 9.4 mg/dL (ref 8.9–10.3)
Chloride: 106 mmol/L (ref 98–111)
Creatinine, Ser: 1.76 mg/dL — ABNORMAL HIGH (ref 0.61–1.24)
GFR, Estimated: 39 mL/min — ABNORMAL LOW (ref >60)
Glucose, Bld: 181 mg/dL — ABNORMAL HIGH (ref 70–99)
Potassium: 4.1 mmol/L (ref 3.5–5.1)
Sodium: 140 mmol/L (ref 135–145)
Total Bilirubin: 1.3 mg/dL — ABNORMAL HIGH (ref 0.3–1.2)
Total Protein: 7.5 g/dL (ref 6.5–8.1)

## 2021-11-07 LAB — CBC WITH DIFFERENTIAL/PLATELET
Abs Immature Granulocytes: 0.02 10*3/uL (ref 0.00–0.07)
Basophils Absolute: 0.1 10*3/uL (ref 0.0–0.1)
Basophils Relative: 1 %
Eosinophils Absolute: 0.2 10*3/uL (ref 0.0–0.5)
Eosinophils Relative: 2 %
HCT: 46.6 % (ref 39.0–52.0)
Hemoglobin: 15.3 g/dL (ref 13.0–17.0)
Immature Granulocytes: 0 %
Lymphocytes Relative: 18 %
Lymphs Abs: 1.2 10*3/uL (ref 0.7–4.0)
MCH: 29.7 pg (ref 26.0–34.0)
MCHC: 32.8 g/dL (ref 30.0–36.0)
MCV: 90.3 fL (ref 80.0–100.0)
Monocytes Absolute: 0.6 10*3/uL (ref 0.1–1.0)
Monocytes Relative: 10 %
Neutro Abs: 4.6 10*3/uL (ref 1.7–7.7)
Neutrophils Relative %: 69 %
Platelets: 143 10*3/uL — ABNORMAL LOW (ref 150–400)
RBC: 5.16 MIL/uL (ref 4.22–5.81)
RDW: 13.4 % (ref 11.5–15.5)
WBC: 6.6 10*3/uL (ref 4.0–10.5)
nRBC: 0 % (ref 0.0–0.2)

## 2021-11-07 LAB — TROPONIN I (HIGH SENSITIVITY)
Troponin I (High Sensitivity): 17 ng/L (ref <18)
Troponin I (High Sensitivity): 21 ng/L — ABNORMAL HIGH (ref <18)

## 2021-11-07 LAB — GLUCOSE, CAPILLARY: Glucose-Capillary: 161 mg/dL — ABNORMAL HIGH (ref 70–99)

## 2021-11-07 MED ORDER — MAGNESIUM HYDROXIDE 400 MG/5ML PO SUSP: 30.0000 mL | Freq: Every day | ORAL | Status: DC | PRN

## 2021-11-07 MED ORDER — NITROGLYCERIN 0.4 MG SL SUBL: 0.4000 mg | SUBLINGUAL_TABLET | SUBLINGUAL | Status: DC | PRN

## 2021-11-07 MED ORDER — POTASSIUM CHLORIDE CRYS ER 20 MEQ PO TBCR
20.0000 meq | EXTENDED_RELEASE_TABLET | Freq: Every day | ORAL | Status: DC
  Administered 2021-11-07 – 2021-11-12 (×6): 20 meq via ORAL
  Filled 2021-11-07 (×6): qty 1

## 2021-11-07 MED ORDER — HYDRALAZINE HCL 20 MG/ML IJ SOLN
10.0000 mg | Freq: Four times a day (QID) | INTRAMUSCULAR | Status: DC | PRN
  Administered 2021-11-07 – 2021-11-09 (×3): 10 mg via INTRAVENOUS
  Filled 2021-11-07 (×3): qty 1

## 2021-11-07 MED ORDER — ACETAMINOPHEN 325 MG PO TABS: 650.0000 mg | ORAL_TABLET | Freq: Four times a day (QID) | ORAL | Status: DC | PRN

## 2021-11-07 MED ORDER — ONDANSETRON HCL 4 MG/2ML IJ SOLN: 4.0000 mg | Freq: Four times a day (QID) | INTRAMUSCULAR | Status: DC | PRN

## 2021-11-07 MED ORDER — FUROSEMIDE 10 MG/ML IJ SOLN
20.0000 mg | Freq: Two times a day (BID) | INTRAMUSCULAR | Status: DC
  Administered 2021-11-07 – 2021-11-09 (×4): 20 mg via INTRAVENOUS
  Filled 2021-11-07 (×3): qty 2
  Filled 2021-11-07: qty 4

## 2021-11-07 MED ORDER — ROSUVASTATIN CALCIUM 5 MG PO TABS
5.0000 mg | ORAL_TABLET | Freq: Every day | ORAL | Status: DC
  Administered 2021-11-07 – 2021-11-12 (×6): 5 mg via ORAL
  Filled 2021-11-07 (×7): qty 1

## 2021-11-07 MED ORDER — TRAZODONE HCL 50 MG PO TABS
50.0000 mg | ORAL_TABLET | Freq: Every evening | ORAL | Status: DC | PRN
  Administered 2021-11-07 – 2021-11-11 (×5): 50 mg via ORAL
  Filled 2021-11-07 (×5): qty 1

## 2021-11-07 MED ORDER — EZETIMIBE 10 MG PO TABS
10.0000 mg | ORAL_TABLET | Freq: Every day | ORAL | Status: DC
  Administered 2021-11-07 – 2021-11-12 (×6): 10 mg via ORAL
  Filled 2021-11-07 (×6): qty 1

## 2021-11-07 MED ORDER — APIXABAN 5 MG PO TABS
5.0000 mg | ORAL_TABLET | Freq: Two times a day (BID) | ORAL | Status: DC
  Administered 2021-11-07 – 2021-11-08 (×2): 5 mg via ORAL
  Filled 2021-11-07 (×2): qty 1

## 2021-11-07 MED ORDER — SODIUM CHLORIDE 0.9 % IV SOLN: INTRAVENOUS | Status: DC

## 2021-11-07 MED ORDER — FAMOTIDINE 20 MG PO TABS
20.0000 mg | ORAL_TABLET | Freq: Every day | ORAL | Status: DC
  Administered 2021-11-07 – 2021-11-12 (×6): 20 mg via ORAL
  Filled 2021-11-07 (×6): qty 1

## 2021-11-07 MED ORDER — ONDANSETRON HCL 4 MG PO TABS: 4.0000 mg | ORAL_TABLET | Freq: Four times a day (QID) | ORAL | Status: DC | PRN

## 2021-11-07 MED ORDER — ENOXAPARIN SODIUM 40 MG/0.4ML IJ SOSY: 40.0000 mg | PREFILLED_SYRINGE | INTRAMUSCULAR | Status: DC

## 2021-11-07 MED ORDER — ACETAMINOPHEN 650 MG RE SUPP: 650.0000 mg | Freq: Four times a day (QID) | RECTAL | Status: DC | PRN

## 2021-11-07 MED ORDER — FUROSEMIDE 40 MG PO TABS: 20.0000 mg | ORAL_TABLET | Freq: Every day | ORAL | Status: DC

## 2021-11-07 MED ORDER — MAGNESIUM OXIDE 400 MG PO TABS
400.0000 mg | ORAL_TABLET | Freq: Every day | ORAL | Status: DC
  Administered 2021-11-08 – 2021-11-12 (×5): 400 mg via ORAL
  Filled 2021-11-07 (×11): qty 1

## 2021-11-07 MED ORDER — LINAGLIPTIN 5 MG PO TABS
5.0000 mg | ORAL_TABLET | Freq: Every day | ORAL | Status: DC
  Administered 2021-11-07 – 2021-11-12 (×5): 5 mg via ORAL
  Filled 2021-11-07 (×8): qty 1

## 2021-11-07 MED ORDER — TAMSULOSIN HCL 0.4 MG PO CAPS
0.4000 mg | ORAL_CAPSULE | Freq: Every day | ORAL | Status: DC
  Administered 2021-11-07 – 2021-11-12 (×6): 0.4 mg via ORAL
  Filled 2021-11-07 (×6): qty 1

## 2021-11-07 MED ORDER — HYDRALAZINE HCL 20 MG/ML IJ SOLN
10.0000 mg | Freq: Once | INTRAMUSCULAR | Status: AC
  Administered 2021-11-07: 10 mg via INTRAVENOUS
  Filled 2021-11-07: qty 1

## 2021-11-07 MED ORDER — LOSARTAN POTASSIUM 50 MG PO TABS
100.0000 mg | ORAL_TABLET | Freq: Every day | ORAL | Status: DC
  Administered 2021-11-07 – 2021-11-12 (×6): 100 mg via ORAL
  Filled 2021-11-07 (×6): qty 2

## 2021-11-07 MED ORDER — GLIPIZIDE ER 5 MG PO TB24
5.0000 mg | ORAL_TABLET | Freq: Every day | ORAL | Status: DC
  Administered 2021-11-08 – 2021-11-12 (×5): 5 mg via ORAL
  Filled 2021-11-07 (×6): qty 1

## 2021-11-07 MED ORDER — DAPAGLIFLOZIN PROPANEDIOL 10 MG PO TABS
10.0000 mg | ORAL_TABLET | Freq: Every day | ORAL | Status: DC
  Administered 2021-11-08 – 2021-11-12 (×5): 10 mg via ORAL
  Filled 2021-11-07 (×6): qty 1

## 2021-11-07 NOTE — Assessment & Plan Note (Signed)
Continued on Eliquis.  Chronotropic agents initially held due to bradycardia as above.  Metoprolol resumed at lower dose.

## 2021-11-07 NOTE — Progress Notes (Signed)
   11/07/21 2055  Assess: MEWS Score  Temp 98 F (36.7 C)  BP (!) 209/75  MAP (mmHg) 112  Pulse Rate (!) 45  Resp 20  SpO2 98 %  O2 Device Room Air  Assess: MEWS Score  MEWS Temp 0  MEWS Systolic 2  MEWS Pulse 1  MEWS RR 0  MEWS LOC 0  MEWS Score 3  MEWS Score Color Yellow  Assess: if the MEWS score is Yellow or Red  Were vital signs taken at a resting state? Yes  Focused Assessment No change from prior assessment  Does the patient meet 2 or more of the SIRS criteria? No  MEWS guidelines implemented *See Row Information* No, previously yellow, continue vital signs every 4 hours  Assess: SIRS CRITERIA  SIRS Temperature  0  SIRS Pulse 0  SIRS Respirations  0  SIRS WBC 1  SIRS Score Sum  1   Patient had a yellow MEWs in ED within the last 24 hours. Patient is admitted for bradycardia and hypertension. Will continue current treatment

## 2021-11-07 NOTE — Consult Note (Signed)
Cardiology Consultation   Patient ID: BLUE WINTHER MRN: [id number]; DOB: April 25, 1940  Admit date: 11/07/2021 Date of Consult: 11/07/2021  PCP:  Shelda Jakes, MD   [facility] Providers Cardiologist:  Ida Rogue, MD   { Physician requesting consult: Dr. Sidney Ace Reason for consult: Symptomatic bradycardia with  Patient Profile:   Mr. Norgaard is a very pleasant 81 year old gentleman with  long history of smoking for 30-40 years,  alcohol use,  obesity,  chronic diastolic CHF,Echo March 1950 with ejection fraction 50-55% hypertension, late type 2 diabetes,  Significant coronary disease by catheterization August 2015 three-vessel  sleep apnea, uses his CPAP periodically Permanent atrial fibrillation presenting to the emergency room for bradycardia, increasing shortness of breath over the past 1 to 2 weeks  History of Present Illness:   Mr. Nicklaus reports he has been in his usual state of health until the past week or 2, increasing shortness of breath on exertion, PND orthopnea, sleeping upright.  He called the cardiology office in triage recommended he come to the emergency room  At home, recorded pulse rate in the 30s for most of the week Reports compliant with his medications including carvedilol, clonidine, diltiazem, Lasix, losartan Reports having mild lower extremity edema, no significant change from his baseline Reports that he tried to go to work several days this week, was limited by shortness of breath  In the emergency room EKG showing atrial fibrillation ventricular rate in the high 30s Blood pressure elevated though labile ranging from 932 up to 671 systolic  Chest ray with mild interstitial edema  Troponin pending Creatinine above his baseline, 1.76 BUN 31   Past Medical History:  Diagnosis Date   Acute renal failure ([name])    BPH (benign prostatic hyperplasia)    Chronic atrial fibrillation ([name])    Chronic diastolic CHF (congestive  heart failure) ([place])    a. echo as above   Chronic insomnia    Coronary artery disease, non-occlusive 10/06/2013   a. cath 09/2013: pLAD 40, CTO Diag, ramus 40-->80, OM 78 mRCA 40, med rx   Depression    Diabetes mellitus with complication ([place])    Diverticulitis of colon    Dysrhythmia    atrial fibrillation   Heart murmur    systolic murmur, unspecified   History of diverticulitis    Hypercholesterolemia    Hyperkalemia    Hyperlipidemia    Hyperlipidemia    Hypertension    Hypokalemia    Hypomagnesemia    Malignant melanoma ([place])    Melanoma ([name])    Mitral regurgitation    a. echo 09/2013: EF 55-60%, mildly dilated left atrrium, mild to moderate MR/TR   Sleep apnea in adult     Past Surgical History:  Procedure Laterality Date   APPENDECTOMY     CARDIAC CATHETERIZATION  10/06/2013   COLONOSCOPY     COLONOSCOPY WITH PROPOFOL N/A 11/23/2014   Procedure: COLONOSCOPY WITH PROPOFOL;  Surgeon: Manya Silvas, MD;  Location: [facility] ENDOSCOPY;  Service: Endoscopy;  Laterality: N/A;   COLONOSCOPY WITH PROPOFOL N/A 04/11/2020   Procedure: COLONOSCOPY WITH PROPOFOL;  Surgeon: Toledo, Benay Pike, MD;  Location: [facility] ENDOSCOPY;  Service: Gastroenterology;  Laterality: N/A;  DM  ELIQUIS   MOHS SURGERY     removal tubular adenoma       Home Medications:  Prior to Admission medications   Medication Sig Start Date End Date Taking? Authorizing Provider  acetaminophen (TYLENOL) 500 MG tablet Take 1,000 mg by mouth every 6 (  six) hours as needed for mild pain, fever or headache.     [provider]  apixaban (ELIQUIS) 5 MG TABS tablet Take 1 tablet (5 mg total) by mouth 2 (two) times daily. 03/14/21   Minna Merritts, MD  carvedilol (COREG) 25 MG tablet Take 12.5 mg by mouth 2 (two) times daily. 10/23/21   [provider]  cloNIDine (CATAPRES) 0.1 MG tablet Take 1 tablet (0.1 mg total) by mouth 3 (three) times daily. 0.1 mg in am, 0.1 mg at noon, 0.2 mg (two tabs) in the pm  12/10/20   Minna Merritts, MD  diltiazem (CARDIZEM CD) 120 MG 24 hr capsule Take 1 capsule (120 mg total) by mouth daily. 09/30/21   Minna Merritts, MD  ezetimibe (ZETIA) 10 MG tablet Take 1 tablet by mouth once daily 10/22/21   Minna Merritts, MD  famotidine (PEPCID) 20 MG tablet Take 1 tablet (20 mg total) by mouth at bedtime. 05/19/16   Dustin Flock, MD  FARXIGA 10 MG TABS tablet Take 1 tablet (10 mg total) by mouth daily. 12/10/20   Minna Merritts, MD  furosemide (LASIX) 20 MG tablet Take 1 tablet (20 mg total) by mouth daily. Except on Saturdays and Sundays 12/10/20   Minna Merritts, MD  glipiZIDE (GLUCOTROL XL) 5 MG 24 hr tablet Take 5 mg by mouth daily with breakfast. 11/14/20   [provider]  linagliptin (TRADJENTA) 5 MG TABS tablet Take 5 mg by mouth daily.    [provider]  losartan (COZAAR) 100 MG tablet Take 1 tablet (100 mg total) by mouth daily. 12/10/20   Minna Merritts, MD  magnesium oxide (MAG-OX) 400 (241.3 Mg) MG tablet Take 1 tablet by mouth daily. 09/05/18   [provider]  nitroGLYCERIN (NITROSTAT) 0.4 MG SL tablet Place 1 tablet (0.4 mg total) under the tongue every 5 (five) minutes as needed for chest pain. Patient not taking: Reported on 10/03/2021 12/10/20   Minna Merritts, MD  Potassium Chloride ER 20 MEQ TBCR Take 20 mEq by mouth daily. 12/10/20   Minna Merritts, MD  rosuvastatin (CRESTOR) 5 MG tablet Take 1 tablet by mouth once daily 02/15/21   Minna Merritts, MD  tamsulosin (FLOMAX) 0.4 MG CAPS capsule Take 0.4 mg by mouth daily.  12/10/13   [provider]  traZODone (DESYREL) 50 MG tablet Take 50 mg by mouth at bedtime as needed for sleep.    [provider]    Inpatient Medications: Scheduled Meds:  apixaban  5 mg Oral BID   dapagliflozin propanediol  10 mg Oral Daily   enoxaparin (LOVENOX) injection  40 mg Subcutaneous Q24H   ezetimibe  10 mg Oral Daily   famotidine  20 mg Oral QHS    furosemide  20 mg Oral Daily   [START ON 11/08/2021] glipiZIDE  5 mg Oral Q breakfast   linagliptin  5 mg Oral Daily   losartan  100 mg Oral Daily   magnesium oxide  400 mg Oral Daily   Potassium Chloride ER  20 mEq Oral Daily   rosuvastatin  5 mg Oral Daily   tamsulosin  0.4 mg Oral Daily   Continuous Infusions:  sodium chloride     PRN Meds: acetaminophen **OR** acetaminophen, hydrALAZINE, magnesium hydroxide, nitroGLYCERIN, ondansetron **OR** ondansetron (ZOFRAN) IV, traZODone  Allergies:    Allergies  Allergen Reactions   Hydrocodone-Acetaminophen Other (See Comments)   Aspirin Other (See Comments)    Contraindicated -  Kidney disease Other reaction(s): Other (See Comments), Other (See Comments) Contraindicated - Kidney disease Contraindicated - Kidney disease   Ciprofloxacin Other (See Comments)    Other reaction(s): Pt states he could hardly move Other reaction(s): Other (See Comments), Other (See Comments) Pt states he could hardly move Other reaction(s): Other (See Comments) Pt states he could hardly move Other reaction(s): Pt states he could hardly move Pt states he could hardly move   Sitagliptin Other (See Comments)    Other reaction(s): Weakness  Other reaction(s): Other (See Comments), Other (See Comments) Weakness Other reaction(s): Other (See Comments) Weakness Other reaction(s): Weakness Weakness    Social History:   Social History   Socioeconomic History   Marital status: Widowed    Spouse name: Not on file   Number of children: Not on file   Years of education: Not on file   Highest education level: Not on file  Occupational History   Not on file  Tobacco Use   Smoking status: Former    Packs/day: 3.00    Years: 30.00    Total pack years: 90.00    Types: Cigarettes    Quit date: 50    Years since quitting: 34.7   Smokeless tobacco: Never  Vaping Use   Vaping Use: Never used  Substance and Sexual Activity   Alcohol use: Yes     Comment: "occassionally have a few drinks"   Drug use: Not Currently   Sexual activity: Not on file  Other Topics Concern   Not on file  Social History Narrative   Not on file   Social Determinants of Health   Financial Resource Strain: Not on file  Food Insecurity: Not on file  Transportation Needs: Not on file  Physical Activity: Not on file  Stress: Not on file  Social Connections: Not on file  Intimate Partner Violence: Not on file    Family History:    Family History  Family history unknown: Yes     ROS:  Please see the history of present illness.  Review of Systems  Constitutional:  Positive for malaise/fatigue.  HENT: Negative.    Respiratory:  Positive for shortness of breath.   Cardiovascular: Negative.   Gastrointestinal: Negative.   Musculoskeletal: Negative.   Neurological: Negative.   Psychiatric/Behavioral: Negative.    All other systems reviewed and are negative.   Physical Exam/Data:   Vitals:   11/07/21 1438 11/07/21 1439 11/07/21 1615  BP:  (!) 198/78 (!) 185/95  Pulse:  (!) 40 (!) 38  Resp:  15 15  Temp:  98.3 F (36.8 C) 98.1 F (36.7 C)  TempSrc:   Oral  SpO2: 99% 98% 98%   No intake or output data in the 24 hours ending 11/07/21 1654    10/03/2021    2:04 PM 12/10/2020    7:58 AM 04/11/2020    7:26 AM  Last 3 Weights  Weight (lbs) 209 lb 211 lb 2 oz 208 lb  Weight (kg) 94.802 kg 95.766 kg 94.348 kg     There is no height or weight on file to calculate BMI.  General:  Well nourished, well developed, in no acute distress HEENT: normal Neck: no JVD Vascular: No carotid bruits; Distal pulses 2+ bilaterally Cardiac:  normal S1, S2; RRR; no murmur  Lungs:  clear to auscultation bilaterally, no wheezing, rhonchi or rales  Abd: soft, nontender, no hepatomegaly  Ext: no edema Musculoskeletal:  No deformities, BUE and BLE strength normal and equal Skin: warm and  dry  Neuro:  CNs 2-12 intact, no focal abnormalities noted Psych:  Normal  affect   EKG:  The EKG was personally reviewed and demonstrates:   Atrial fibrillation right bundle branch block , ventricular rate 39 bpm  Telemetry:  Telemetry was personally reviewed and demonstrates:   Atrial fibrillation with slow ventricular rate rate in the high 30s to low 40s  Relevant CV Studies: Echocardiogram pending  Laboratory Data:  High Sensitivity Troponin:   Recent Labs  Lab 11/07/21 1501  TROPONINIHS 17     Chemistry Recent Labs  Lab 11/07/21 1501  NA 140  K 4.1  CL 106  CO2 26  GLUCOSE 181*  BUN 31*  CREATININE 1.76*  CALCIUM 9.4  GFRNONAA 39*  ANIONGAP 8    Recent Labs  Lab 11/07/21 1501  PROT 7.5  ALBUMIN 4.0  AST 23  ALT 25  ALKPHOS 101  BILITOT 1.3*   Lipids No results for input(s): "CHOL", "TRIG", "HDL", "LABVLDL", "LDLCALC", "CHOLHDL" in the last 168 hours.  Hematology Recent Labs  Lab 11/07/21 1501  WBC 6.6  RBC 5.16  HGB 15.3  HCT 46.6  MCV 90.3  MCH 29.7  MCHC 32.8  RDW 13.4  PLT 143*   Thyroid No results for input(s): "TSH", "FREET4" in the last 168 hours.  BNPNo results for input(s): "BNP", "PROBNP" in the last 168 hours.  DDimer No results for input(s): "DDIMER" in the last 168 hours.   Radiology/Studies:  DG Chest Port 1 View  Result Date: 11/07/2021 CLINICAL DATA:  Chest pain EXAM: PORTABLE CHEST 1 VIEW COMPARISON:  Radiograph 01/17/2020 FINDINGS: Unchanged mild cardiomegaly. There are interstitial opacity. No large pleural effusion. No pneumothorax. No acute osseous abnormality. Bilateral shoulder degenerative changes. Thoracic spondylosis. IMPRESSION: Mild interstitial edema.  Unchanged mild cardiomegaly. Electronically Signed   By: Maurine Simmering M.D.   On: 11/07/2021 15:17     Assessment and Plan:   Symptomatic bradycardia Symptomatic over the past week or 2 with increasing shortness of breath, fatigue general malaise -We will recommend we hold clonidine, diltiazem, Coreg Will hope we can avoid pacemaker with  a washout of above medications -Echocardiogram pending -Consider dose of IV Lasix in the setting of pulmonary edema exacerbated by bradycardia/A-fib  2.  Atrial fibrillation, permanent We will need to hold his rate controlling agents including carvedilol and diltiazem Continue Eliquis 5 twice daily  3.  Diabetes type 2 Recent A1c 8.0 Managed with Wilder Glade, Tradjenta  4.  Chronic diastolic CHF As outpatient has been taking Farxiga 10 mg daily, Lasix 20 mg daily with potassium 20 daily Consider extra dose IV Lasix for pulmonary edema in the setting of bradycardia  5.  Essential hypertension, labile Given we need to hold diltiazem, clonidine, Coreg as above Would continue losartan,  Would likely need to initiate other medication such as hydralazine, amlodipine   Total encounter time more than 80 minutes  Greater than 50% was spent in counseling and coordination of care with the patient   For questions or updates, please contact [place] Please consult [email or web address] for contact info under    Signed, Ida Rogue, MD  11/07/2021 4:54 PM

## 2021-11-07 NOTE — Assessment & Plan Note (Signed)
Cardiology following.  Hopefully washout will occur over the next few days.  Cardizem, beta-blocker and clonidine on hold.

## 2021-11-07 NOTE — H&P (Signed)
[place]   PATIENT NAME: Todd Valentine    MR#:  [id number]  DATE OF BIRTH:  [birth year]  DATE OF ADMISSION:  11/07/2021  PRIMARY CARE PHYSICIAN: Shelda Jakes, MD   Patient is coming from: Home  REQUESTING/REFERRING PHYSICIAN: Harvest Dark, MD  CHIEF COMPLAINT:   Chief Complaint  Patient presents with   Hypertension    HISTORY OF PRESENT ILLNESS:  Todd Valentine is a 81 y.o. Caucasian male with medical history significant for type diabetes mellitus, depression, BPH, chronic atrial fibrillation, chronic diastolic CHF, hypertension, dyslipidemia, and history of melena melanoma, as well as OSA, who presented to the ER with acute onset of elevated blood pressure and bradycardia at home.  For the last couple of days he has been feeling weak at times and has been checking his BP and heart rate.  His heart rate has been low around 40 and BP was elevated 198/78.  He talk to Dr. Rockey Situ today who recommended him calling 911 to come to the emergency room.  The patient's been having mild dyspnea and orthopnea with lower extremity edema.  He admitted to dyspnea on exertion.  He denies any chest pain or palpitations.  No fever or chills.  No nausea or vomiting or abdominal pain.  No dysuria, oliguria or hematuria or flank pain.  No headache or dizziness or blurred vision.  ED Course: When he came to the ER, BP was 198/78 with a heart rate of 40 with otherwise normal vital signs.  Labs revealed a BUN of 31 with a creatinine 1.76 close to baseline with GFR of 39.  Total bili was 1.3 and CBC showed mild thrombocytopenia 133.  EKG as reviewed by me : EKG showed atrial fibrillation/flutter with a rate of 39 Imaging: Portable chest x-ray showed mild interstitial edema and unchanged mild cardiomegaly.  The patient was given 10 mg of IV hydralazine.  He will be admitted to a progressive unit observation bed for further evaluation and management. PAST MEDICAL HISTORY:   Past Medical  History:  Diagnosis Date   Acute renal failure ([name])    BPH (benign prostatic hyperplasia)    Chronic atrial fibrillation ([name])    Chronic diastolic CHF (congestive heart failure) ([place])    a. echo as above   Chronic insomnia    Coronary artery disease, non-occlusive 10/06/2013   a. cath 09/2013: pLAD 40, CTO Diag, ramus 40-->80, OM 20 mRCA 40, med rx   Depression    Diabetes mellitus with complication ([name])    Diverticulitis of colon    Dysrhythmia    atrial fibrillation   Heart murmur    systolic murmur, unspecified   History of diverticulitis    Hypercholesterolemia    Hyperkalemia    Hyperlipidemia    Hyperlipidemia    Hypertension    Hypokalemia    Hypomagnesemia    Malignant melanoma ([place])    Melanoma ([name])    Mitral regurgitation    a. echo 09/2013: EF 55-60%, mildly dilated left atrrium, mild to moderate MR/TR   Sleep apnea in adult     PAST SURGICAL HISTORY:   Past Surgical History:  Procedure Laterality Date   APPENDECTOMY     CARDIAC CATHETERIZATION  10/06/2013   COLONOSCOPY     COLONOSCOPY WITH PROPOFOL N/A 11/23/2014   Procedure: COLONOSCOPY WITH PROPOFOL;  Surgeon: Manya Silvas, MD;  Location: [facility] ENDOSCOPY;  Service: Endoscopy;  Laterality: N/A;   COLONOSCOPY WITH PROPOFOL N/A 04/11/2020   Procedure: COLONOSCOPY  WITH PROPOFOL;  Surgeon: Toledo, Benay Pike, MD;  Location: [facility] ENDOSCOPY;  Service: Gastroenterology;  Laterality: N/A;  DM  ELIQUIS   MOHS SURGERY     removal tubular adenoma      SOCIAL HISTORY:   Social History   Tobacco Use   Smoking status: Former    Packs/day: 3.00    Years: 30.00    Total pack years: 90.00    Types: Cigarettes    Quit date: 1989    Years since quitting: 34.7   Smokeless tobacco: Never  Substance Use Topics   Alcohol use: Yes    Comment: "occassionally have a few drinks"    FAMILY HISTORY:   Family History  Family history unknown: Yes    DRUG ALLERGIES:   Allergies  Allergen Reactions    Hydrocodone-Acetaminophen Other (See Comments)   Aspirin Other (See Comments)    Contraindicated - Kidney disease Other reaction(s): Other (See Comments), Other (See Comments) Contraindicated - Kidney disease Contraindicated - Kidney disease   Ciprofloxacin Other (See Comments)    Other reaction(s): Pt states he could hardly move Other reaction(s): Other (See Comments), Other (See Comments) Pt states he could hardly move Other reaction(s): Other (See Comments) Pt states he could hardly move Other reaction(s): Pt states he could hardly move Pt states he could hardly move   Sitagliptin Other (See Comments)    Other reaction(s): Weakness  Other reaction(s): Other (See Comments), Other (See Comments) Weakness Other reaction(s): Other (See Comments) Weakness Other reaction(s): Weakness Weakness    REVIEW OF SYSTEMS:   ROS As per history of present illness. All pertinent systems were reviewed above. Constitutional, HEENT, cardiovascular, respiratory, GI, GU, musculoskeletal, neuro, psychiatric, endocrine, integumentary and hematologic systems were reviewed and are otherwise negative/unremarkable except for positive findings mentioned above in the HPI.   MEDICATIONS AT HOME:   Prior to Admission medications   Medication Sig Start Date End Date Taking? Authorizing Provider  acetaminophen (TYLENOL) 500 MG tablet Take 1,000 mg by mouth every 6 (six) hours as needed for mild pain, fever or headache.     [provider]  apixaban (ELIQUIS) 5 MG TABS tablet Take 1 tablet (5 mg total) by mouth 2 (two) times daily. 03/14/21   Minna Merritts, MD  carvedilol (COREG) 12.5 MG tablet Take 1 tablet (12.5 mg total) by mouth 2 (two) times daily with a meal. 12/10/20   Gollan, Kathlene November, MD  cloNIDine (CATAPRES) 0.1 MG tablet Take 1 tablet (0.1 mg total) by mouth 3 (three) times daily. 0.1 mg in am, 0.1 mg at noon, 0.2 mg (two tabs) in the pm 12/10/20   Minna Merritts, MD  diltiazem  (CARDIZEM CD) 120 MG 24 hr capsule Take 1 capsule (120 mg total) by mouth daily. 09/30/21   Minna Merritts, MD  ezetimibe (ZETIA) 10 MG tablet Take 1 tablet by mouth once daily 10/22/21   Minna Merritts, MD  famotidine (PEPCID) 20 MG tablet Take 1 tablet (20 mg total) by mouth at bedtime. 05/19/16   Dustin Flock, MD  FARXIGA 10 MG TABS tablet Take 1 tablet (10 mg total) by mouth daily. 12/10/20   Minna Merritts, MD  furosemide (LASIX) 20 MG tablet Take 1 tablet (20 mg total) by mouth daily. Except on Saturdays and Sundays 12/10/20   Minna Merritts, MD  glipiZIDE (GLUCOTROL XL) 5 MG 24 hr tablet Take 5 mg by mouth daily with breakfast. 11/14/20   [provider]  linagliptin (TRADJENTA) 5  MG TABS tablet Take 5 mg by mouth daily.    [provider]  losartan (COZAAR) 100 MG tablet Take 1 tablet (100 mg total) by mouth daily. 12/10/20   Minna Merritts, MD  magnesium oxide (MAG-OX) 400 (241.3 Mg) MG tablet Take 1 tablet by mouth daily. 09/05/18   [provider]  nitroGLYCERIN (NITROSTAT) 0.4 MG SL tablet Place 1 tablet (0.4 mg total) under the tongue every 5 (five) minutes as needed for chest pain. Patient not taking: Reported on 10/03/2021 12/10/20   Minna Merritts, MD  Potassium Chloride ER 20 MEQ TBCR Take 20 mEq by mouth daily. 12/10/20   Minna Merritts, MD  rosuvastatin (CRESTOR) 5 MG tablet Take 1 tablet by mouth once daily 02/15/21   Minna Merritts, MD  tamsulosin (FLOMAX) 0.4 MG CAPS capsule Take 0.4 mg by mouth daily.  12/10/13   [provider]  traZODone (DESYREL) 50 MG tablet Take 50 mg by mouth at bedtime as needed for sleep.    [provider]      VITAL SIGNS:  Blood pressure (!) 185/95, pulse (!) 38, temperature 98.1 F (36.7 C), temperature source Oral, resp. rate 15, SpO2 98 %.  PHYSICAL EXAMINATION:  Physical Exam  GENERAL:  81 y.o.-year-old patient lying in the bed with no acute distress.  EYES: Pupils equal,  round, reactive to light and accommodation. No scleral icterus. Extraocular muscles intact.  HEENT: Head atraumatic, normocephalic. Oropharynx and nasopharynx clear.  NECK:  Supple, no jugular venous distention. No thyroid enlargement, no tenderness.  LUNGS: Normal breath sounds bilaterally, no wheezing, rales,rhonchi or crepitation. No use of accessory muscles of respiration.  CARDIOVASCULAR: Regular rate and rhythm, S1, S2 normal. No murmurs, rubs, or gallops.  ABDOMEN: Soft, nondistended, nontender. Bowel sounds present. No organomegaly or mass.  EXTREMITIES: No pedal edema, cyanosis, or clubbing.  NEUROLOGIC: Cranial nerves II through XII are intact. Muscle strength 5/5 in all extremities. Sensation intact. Gait not checked.  PSYCHIATRIC: The patient is alert and oriented x 3.  Normal affect and good eye contact. SKIN: No obvious rash, lesion, or ulcer.   LABORATORY PANEL:   CBC Recent Labs  Lab 11/07/21 1501  WBC 6.6  HGB 15.3  HCT 46.6  PLT 143*   ------------------------------------------------------------------------------------------------------------------  Chemistries  Recent Labs  Lab 11/07/21 1501  NA 140  K 4.1  CL 106  CO2 26  GLUCOSE 181*  BUN 31*  CREATININE 1.76*  CALCIUM 9.4  AST 23  ALT 25  ALKPHOS 101  BILITOT 1.3*   ------------------------------------------------------------------------------------------------------------------  Cardiac Enzymes No results for input(s): "TROPONINI" in the last 168 hours. ------------------------------------------------------------------------------------------------------------------  RADIOLOGY:  DG Chest Port 1 View  Result Date: 11/07/2021 CLINICAL DATA:  Chest pain EXAM: PORTABLE CHEST 1 VIEW COMPARISON:  Radiograph 01/17/2020 FINDINGS: Unchanged mild cardiomegaly. There are interstitial opacity. No large pleural effusion. No pneumothorax. No acute osseous abnormality. Bilateral shoulder degenerative changes.  Thoracic spondylosis. IMPRESSION: Mild interstitial edema.  Unchanged mild cardiomegaly. Electronically Signed   By: Maurine Simmering M.D.   On: 11/07/2021 15:17      IMPRESSION AND PLAN:  Assessment and Plan: * Hypertensive urgency - The patient will admitted to an observation PCU bed. - We will place him on as needed IV hydralazine. - We will resume his antihypertensives that would not give him bradycardia.  Atrial flutter with controlled response ([place]) - We will continue his Eliquis. - The patient actually has slow ventricular response and therefore we will hold off  Cardizem CD and Coreg. - We will also hold clonidine that can cause bradycardia.  Symptomatic bradycardia - This is manifested by generalized weakness that he has been experiencing. - We will hold off rate limiting medications. - Cardiology consult will be obtained. - Dr. Rockey Situ was notified and is aware about the patient.  Acute on chronic diastolic CHF (congestive heart failure) ([place]) - We will gently diuresis with IV Lasix. - We will continue his Farxiga and Cozaar.  Type 2 diabetes mellitus with chronic kidney disease, without long-term current use of insulin ([name]) Stable state- This is associated with stage 3B chronic kidney disease. - The patient will be placed on supplement coverage with NovoLog.        DVT prophylaxis: Lovenox.  Advanced Care Planning:  Code Status: full code.  Family Communication:  The plan of care was discussed in details with the patient (and family). I answered all questions. The patient agreed to proceed with the above mentioned plan. Further management will depend upon hospital course. Disposition Plan: Back to previous home environment Consults called: Cardiology All the records are reviewed and case discussed with ED provider.  Status is: Observation   I certify that at the time of admission, it is my clinical judgment that the patient will require hospital care extending less  than 2 midnights.                            Dispo: The patient is from: Home              Anticipated d/c is to: Home              Patient currently is not medically stable to d/c.              Difficult to place patient: No  Christel Mormon M.D on 11/07/2021 at 5:04 PM  [facility]   From 7 PM-7 AM, contact night-coverage [email or web address]  CC: Primary care physician; Shelda Jakes, MD

## 2021-11-07 NOTE — Telephone Encounter (Addendum)
Spoke with patients daughter who is not with the patient. She had been on the phone with him last night and today. She stated that he told her that he is extremely tired, and had given her the vital signs as charted below. Patient has a history of hx of tobacco use, alcohol use, obesity, chronic diastolic heart failure, chronic atrial fibrillation, RBBB, CKD, HTN, DM 2, CAD, and OSA,  Last EKG done at last OV with Dr. Rockey Situ on 12/10/2020 showed Afib with slow ventricular response (HR 58). Patient does monitor his vitals regularly and has not had heart rates this low.  I advised that he be taken to the ED asap. Patients daughter is an hour away from the patient. I advised that she call 911 for transport to the ED, if his heart rate is truly 35-40, he needs to be assessed asap. Patients daughter verbalized understanding and agreed with plan.

## 2021-11-07 NOTE — ED Triage Notes (Signed)
Pt came from home. Per EMS, pt started to feel tired yesterday and his BP was incraseing as high as systolic 093 and bradycardia. Pt reports mild weakness on exertion but denies chest pain.   EMS Vitals BP:236/74   20g left AC

## 2021-11-07 NOTE — ED Notes (Addendum)
EKG completed and given to Dr. Cheri Fowler. Report given to Jenny Reichmann, RN  Pt is not complaining of any pain or dizziness. Pt placed on zoll pads, as per EDP.

## 2021-11-07 NOTE — Assessment & Plan Note (Signed)
Blood pressure was markedly elevated with systolic ranging from the 180s to 200s.  Due to bradycardia, held Cardizem, clonidine and Coreg initially and continued Cozaar, adding Cardura, Norvasc and Imdur.  Blood pressure now better controlled.  Underlying etiology looks to be renal artery stenosis and patient being seen by vascular surgery for possible stent placement 9/20.

## 2021-11-07 NOTE — Assessment & Plan Note (Signed)
-   We will gently diuresis with IV Lasix. - We will continue his Farxiga and Cozaar.

## 2021-11-07 NOTE — Telephone Encounter (Signed)
Pt c/o BP issue: STAT if pt c/o blurred vision, one-sided weakness or slurred speech  1. What are your last 5 BP readings?  09/13: A.M. 185/80 HR 35  P.M 188/64 HR 40 09/14: 4:45 A.M. - 187/80 HR 38           10:30 A.M. -  200/91 HR 40  2. Are you having any other symptoms (ex. Dizziness, headache, blurred vision, passed out)? Really tired   3. What is your BP issue? Pt's daughter is calling in with concerns of father high BP and low HR.

## 2021-11-07 NOTE — Assessment & Plan Note (Signed)
Stable state- This is associated with stage 3B chronic kidney disease. - The patient will be placed on supplement coverage with NovoLog.

## 2021-11-07 NOTE — ED Provider Notes (Signed)
[facility] Provider Note    Event Date/Time   First MD Initiated Contact with Patient 11/07/21 1505     (approximate)  History   Chief Complaint: Bradycardia, hypertension  HPI  ASHLEE BEWLEY is a 81 y.o. male with a past medical history of CHF, CAD, hypertension, diabetes, hyperlipidemia, presents to the emergency department for low heart rate and high blood pressure.  According to the patient for the last 2 days or so he has been feeling weak at times, he has been checking his blood pressure which has been persistently elevated and his heart rate has been persistently low around 40 bpm.  He spoke to his cardiologist today Dr. Rockey Situ who recommended he call 911 and come to the emergency department immediately so the patient did.  Here the patient is awake alert he is mentating well.  Blood pressure is elevated 198/78, more concerning [place] the patient is heart rate fluctuates between 35 and 40 bpm.  Denies any history of bradycardia previously.  Patient does have a history of atrial fibrillation takes Eliquis and carvedilol.    Physical Exam   Triage Vital Signs: ED Triage Vitals  Enc Vitals Group     BP 11/07/21 1439 (!) 198/78     Pulse Rate 11/07/21 1439 (!) 40     Resp 11/07/21 1439 15     Temp 11/07/21 1439 98.3 F (36.8 C)     Temp src --      SpO2 11/07/21 1438 99 %     Weight --      Height --      Head Circumference --      Peak Flow --      Pain Score 11/07/21 1440 0     Pain Loc --      Pain Edu? --      Excl. in [place]? --     Most recent vital signs: Vitals:   11/07/21 1438 11/07/21 1439  BP:  (!) 198/78  Pulse:  (!) 40  Resp:  15  Temp:  98.3 F (36.8 C)  SpO2: 99% 98%    General: Awake, no distress.  CV:  Good peripheral perfusion.  Patient's heart rate is low around 40 bpm.  Appears fairly regular. Resp:  Normal effort.  Equal breath sounds bilaterally.  Abd:  No distention.  Soft, nontender.  No rebound or guarding.   ED  Results / Procedures / Treatments   EKG  EKG viewed and interpreted by myself appears to show atrial fibrillation however the QRS complexes appear to be bradycardia possibly junctional rhythm at 39 bpm and appears regular.  Slightly widened QRS, left axis deviation, largely normal intervals with no concerning ST changes.  RADIOLOGY  I have reviewed and interpreted the chest x-ray images.  No obvious consolidation seen on my evaluation. Radiology has read the x-ray as mild interstitial edema.   MEDICATIONS ORDERED IN ED: Medications - No data to display   IMPRESSION / MDM / [place] / ED COURSE  I reviewed the triage vital signs and the nursing notes.  Patient's presentation is most consistent with acute presentation with potential threat to life or bodily function.  Patient presents to the emergency department for hypertension and bradycardia.  States he has been feeling weak over the past 2 days.  Patient's blood pressure has been elevated around 841 systolic and his heart rate has been lower around 40.  Currently patient's heart rate is around 38 bpm.  Appears to be regular with underlying A-fib possibly indicating complete heart block.  We will check labs including cardiac enzymes electrolytes and CBC.  Patient is on carvedilol for rate control.  Differential would include medication induced bradycardia, complete heart block, atrial fibrillation with bradycardia, ACS, metabolic or electrolyte abnormality such as renal insufficiency.  Patient follows up with Dr. Rockey Situ, we will discuss with cardiology once lab results are known.  Currently patient appears well, mentating well, no chest pain or shortness of breath.  Patient has pacing pads in place.  Patient's work-up in the emergency department is overall reassuring.  CBC is normal besides mild pedia largely unchanged from historical values.  Troponin negative, chemistry shows renal insufficiency, not significant change from  historical values.  I spoke with Dr. Rockey Situ who recommends use of hydralazine for blood pressure control.  We will admit to the hospitalist service and stop the patient's beta-blockers to see if his heart rate improves.  Patient agreeable to plan of care.  FINAL CLINICAL IMPRESSION(S) / ED DIAGNOSES   Bradycardia Hypertension  Note:  This document was prepared using Dragon voice recognition software and may include unintentional dictation errors.   Harvest Dark, MD 11/07/21 1623

## 2021-11-07 NOTE — ED Provider Notes (Signed)
Emergency Medicine Provider Triage Evaluation Note  Todd Valentine , a 81 y.o. male  was evaluated in triage.  Pt complains of bradycardia and hypertension.  EMS arrives stating that patient was found with heart rate in the 30s and 40s as well as systolic blood pressures of 230.  Patient states that he has had fatigue and dyspnea on exertion but no other complaints.  Review of Systems  Positive: Fatigue, dyspnea on exertion Negative: Denies chest pain, shortness of breath, neck/arm pain  Physical Exam  BP (!) 198/78   Pulse (!) 40   Temp 98.3 F (36.8 C)   Resp 15   SpO2 98%  Gen:   Awake, no distress   Resp:  Normal effort  MSK:   Moves extremities without difficulty  Other:  Elderly overweight Caucasian male laying in bed in no acute distress  Medical Decision Making  Medically screening exam initiated at 2:50 PM.  Appropriate orders placed.  Todd Valentine was informed that the remainder of the evaluation will be completed by another provider, this initial triage assessment does not replace that evaluation, and the importance of remaining in the ED until their evaluation is complete.     Naaman Plummer, MD 11/07/21 1452

## 2021-11-08 ENCOUNTER — Ambulatory Visit: Payer: Medicare Other | Admitting: Physician Assistant

## 2021-11-08 DIAGNOSIS — I708 Atherosclerosis of other arteries: Secondary | ICD-10-CM | POA: Diagnosis not present

## 2021-11-08 DIAGNOSIS — E1151 Type 2 diabetes mellitus with diabetic peripheral angiopathy without gangrene: Secondary | ICD-10-CM | POA: Diagnosis present

## 2021-11-08 DIAGNOSIS — I160 Hypertensive urgency: Secondary | ICD-10-CM | POA: Diagnosis present

## 2021-11-08 DIAGNOSIS — Z885 Allergy status to narcotic agent status: Secondary | ICD-10-CM | POA: Diagnosis not present

## 2021-11-08 DIAGNOSIS — F419 Anxiety disorder, unspecified: Secondary | ICD-10-CM | POA: Diagnosis present

## 2021-11-08 DIAGNOSIS — I701 Atherosclerosis of renal artery: Secondary | ICD-10-CM | POA: Diagnosis present

## 2021-11-08 DIAGNOSIS — N1832 Chronic kidney disease, stage 3b: Secondary | ICD-10-CM | POA: Diagnosis present

## 2021-11-08 DIAGNOSIS — N400 Enlarged prostate without lower urinary tract symptoms: Secondary | ICD-10-CM | POA: Diagnosis present

## 2021-11-08 DIAGNOSIS — E669 Obesity, unspecified: Secondary | ICD-10-CM | POA: Diagnosis present

## 2021-11-08 DIAGNOSIS — Z888 Allergy status to other drugs, medicaments and biological substances status: Secondary | ICD-10-CM | POA: Diagnosis not present

## 2021-11-08 DIAGNOSIS — I4892 Unspecified atrial flutter: Secondary | ICD-10-CM | POA: Diagnosis present

## 2021-11-08 DIAGNOSIS — D696 Thrombocytopenia, unspecified: Secondary | ICD-10-CM | POA: Diagnosis present

## 2021-11-08 DIAGNOSIS — F32A Depression, unspecified: Secondary | ICD-10-CM | POA: Diagnosis present

## 2021-11-08 DIAGNOSIS — R001 Bradycardia, unspecified: Secondary | ICD-10-CM | POA: Diagnosis present

## 2021-11-08 DIAGNOSIS — Z87891 Personal history of nicotine dependence: Secondary | ICD-10-CM | POA: Diagnosis not present

## 2021-11-08 DIAGNOSIS — K551 Chronic vascular disorders of intestine: Secondary | ICD-10-CM | POA: Diagnosis present

## 2021-11-08 DIAGNOSIS — I5033 Acute on chronic diastolic (congestive) heart failure: Secondary | ICD-10-CM | POA: Diagnosis present

## 2021-11-08 DIAGNOSIS — Z85820 Personal history of malignant melanoma of skin: Secondary | ICD-10-CM | POA: Diagnosis not present

## 2021-11-08 DIAGNOSIS — E1122 Type 2 diabetes mellitus with diabetic chronic kidney disease: Secondary | ICD-10-CM | POA: Diagnosis present

## 2021-11-08 DIAGNOSIS — Z6830 Body mass index (BMI) 30.0-30.9, adult: Secondary | ICD-10-CM | POA: Diagnosis not present

## 2021-11-08 DIAGNOSIS — Z7901 Long term (current) use of anticoagulants: Secondary | ICD-10-CM | POA: Diagnosis not present

## 2021-11-08 DIAGNOSIS — I150 Renovascular hypertension: Secondary | ICD-10-CM | POA: Diagnosis present

## 2021-11-08 DIAGNOSIS — I700 Atherosclerosis of aorta: Secondary | ICD-10-CM | POA: Diagnosis not present

## 2021-11-08 DIAGNOSIS — E7800 Pure hypercholesterolemia, unspecified: Secondary | ICD-10-CM | POA: Diagnosis present

## 2021-11-08 DIAGNOSIS — I4821 Permanent atrial fibrillation: Secondary | ICD-10-CM | POA: Diagnosis present

## 2021-11-08 DIAGNOSIS — I2510 Atherosclerotic heart disease of native coronary artery without angina pectoris: Secondary | ICD-10-CM | POA: Diagnosis present

## 2021-11-08 DIAGNOSIS — I5032 Chronic diastolic (congestive) heart failure: Secondary | ICD-10-CM | POA: Diagnosis not present

## 2021-11-08 DIAGNOSIS — Z79899 Other long term (current) drug therapy: Secondary | ICD-10-CM | POA: Diagnosis not present

## 2021-11-08 LAB — CBC
HCT: 47.6 % (ref 39.0–52.0)
Hemoglobin: 15.3 g/dL (ref 13.0–17.0)
MCH: 29.8 pg (ref 26.0–34.0)
MCHC: 32.1 g/dL (ref 30.0–36.0)
MCV: 92.6 fL (ref 80.0–100.0)
Platelets: 113 10*3/uL — ABNORMAL LOW (ref 150–400)
RBC: 5.14 MIL/uL (ref 4.22–5.81)
RDW: 13.7 % (ref 11.5–15.5)
WBC: 7.9 10*3/uL (ref 4.0–10.5)
nRBC: 0 % (ref 0.0–0.2)

## 2021-11-08 LAB — BASIC METABOLIC PANEL
Anion gap: 11 (ref 5–15)
BUN: 27 mg/dL — ABNORMAL HIGH (ref 8–23)
CO2: 20 mmol/L — ABNORMAL LOW (ref 22–32)
Calcium: 9.2 mg/dL (ref 8.9–10.3)
Chloride: 111 mmol/L (ref 98–111)
Creatinine, Ser: 1.63 mg/dL — ABNORMAL HIGH (ref 0.61–1.24)
GFR, Estimated: 42 mL/min — ABNORMAL LOW (ref >60)
Glucose, Bld: 150 mg/dL — ABNORMAL HIGH (ref 70–99)
Potassium: 3.6 mmol/L (ref 3.5–5.1)
Sodium: 142 mmol/L (ref 135–145)

## 2021-11-08 LAB — MAGNESIUM: Magnesium: 2.1 mg/dL (ref 1.7–2.4)

## 2021-11-08 LAB — TSH: TSH: 1.921 u[IU]/mL (ref 0.350–4.500)

## 2021-11-08 MED ORDER — FUROSEMIDE 10 MG/ML IJ SOLN
20.0000 mg | Freq: Once | INTRAMUSCULAR | Status: AC
  Administered 2021-11-08: 20 mg via INTRAVENOUS
  Filled 2021-11-08: qty 2

## 2021-11-08 MED ORDER — DOXAZOSIN MESYLATE 4 MG PO TABS
4.0000 mg | ORAL_TABLET | Freq: Two times a day (BID) | ORAL | Status: DC
  Administered 2021-11-08 – 2021-11-09 (×2): 4 mg via ORAL
  Filled 2021-11-08 (×2): qty 1

## 2021-11-08 MED ORDER — HYDRALAZINE HCL 50 MG PO TABS
75.0000 mg | ORAL_TABLET | Freq: Three times a day (TID) | ORAL | Status: DC
  Administered 2021-11-08: 75 mg via ORAL
  Filled 2021-11-08: qty 1

## 2021-11-08 MED ORDER — AMLODIPINE BESYLATE 10 MG PO TABS: 10.0000 mg | ORAL_TABLET | Freq: Two times a day (BID) | ORAL | Status: DC

## 2021-11-08 MED ORDER — AMLODIPINE BESYLATE 5 MG PO TABS
5.0000 mg | ORAL_TABLET | Freq: Two times a day (BID) | ORAL | Status: DC
  Administered 2021-11-08: 5 mg via ORAL
  Filled 2021-11-08: qty 1

## 2021-11-08 MED ORDER — APIXABAN 2.5 MG PO TABS
2.5000 mg | ORAL_TABLET | Freq: Two times a day (BID) | ORAL | Status: DC
  Administered 2021-11-08 – 2021-11-12 (×9): 2.5 mg via ORAL
  Filled 2021-11-08 (×9): qty 1

## 2021-11-08 MED ORDER — HYDRALAZINE HCL 50 MG PO TABS
100.0000 mg | ORAL_TABLET | Freq: Three times a day (TID) | ORAL | Status: DC
  Administered 2021-11-08 – 2021-11-13 (×14): 100 mg via ORAL
  Filled 2021-11-08 (×14): qty 2

## 2021-11-08 MED ORDER — ISOSORBIDE MONONITRATE ER 60 MG PO TB24
60.0000 mg | ORAL_TABLET | Freq: Every day | ORAL | Status: DC
  Administered 2021-11-08 – 2021-11-09 (×2): 60 mg via ORAL
  Filled 2021-11-08 (×2): qty 1

## 2021-11-08 MED ORDER — AMLODIPINE BESYLATE 5 MG PO TABS
5.0000 mg | ORAL_TABLET | Freq: Two times a day (BID) | ORAL | Status: DC
  Administered 2021-11-08 – 2021-11-09 (×2): 5 mg via ORAL
  Filled 2021-11-08 (×2): qty 1

## 2021-11-08 MED ORDER — HYDRALAZINE HCL 50 MG PO TABS
50.0000 mg | ORAL_TABLET | Freq: Three times a day (TID) | ORAL | Status: DC
  Administered 2021-11-08: 50 mg via ORAL
  Filled 2021-11-08: qty 1

## 2021-11-08 NOTE — Progress Notes (Signed)
Nutrition Brief Note  RD pulled to chart secondary to CHF.   Wt Readings from Last 15 Encounters:  11/08/21 93.7 kg  10/03/21 94.8 kg  12/10/20 95.8 kg  04/11/20 94.3 kg  02/28/20 96.6 kg  01/17/20 95.3 kg  11/24/19 97.1 kg  06/06/19 99 kg  09/25/18 92.1 kg  09/26/18 92.1 kg  09/01/18 95 kg  08/25/17 97.6 kg  02/06/17 94.9 kg  08/12/16 94.1 kg  06/03/16 92.1 kg   Pt with medical history significant for type diabetes mellitus, depression, BPH, chronic atrial fibrillation, chronic diastolic CHF, hypertension, dyslipidemia, and history of melena melanoma, as well as OSA, who presented with acute onset of elevated blood pressure and bradycardia at home.  Pt admitted with hypertensive urgency.   Pt unavailable at time of visit. Attempted to speak with pt via call to hospital room phone, however, unable to reach. RD unable to obtain further nutrition-related history or complete nutrition-focused physical exam at this time.    RD provided "Low Sodium Nutrition Therapy" handout from [facility] Nutrition Care Manual and attached to AVS/ discharge summary.   Lab Results  Component Value Date   HGBA1C 7.1 (H) 02/28/2014   PTA DM medications are 10 mg farxiga daily and 5 mg tradjenta daily.   Labs reviewed: CBGS: 161 (inpatient orders for glycemic control are 5 mg glipizide daily and 5 mg tradjenta daily).    Body mass index is 30.96 kg/m. Patient meets criteria for obesity, class I based on current BMI. Obesity is a complex, chronic medical condition that is optimally managed by a multidisciplinary care team. Weight loss is not an ideal goal for an acute inpatient hospitalization. However, if further work-up for obesity is warranted, consider outpatient referral to outpatient bariatric service and/or [facility] [place] [facility] Nutrition and Diabetes Education Services.    Current diet order is Heart Healthy (liberalized to 2 gram sodium), patient is consuming approximately 75% of meals at this time. Labs  and medications reviewed.   No nutrition interventions warranted at this time. If nutrition issues arise, please consult RD.   Loistine Chance, RD, LDN, [place] Registered Dietitian II Certified Diabetes Care and Education Specialist Please refer to [facility] for RD and/or RD on-call/weekend/after hours pager

## 2021-11-08 NOTE — Assessment & Plan Note (Signed)
Patient meets criteria BMI greater than 30

## 2021-11-08 NOTE — Progress Notes (Signed)
[facility] Progress Note  Patient: Todd Valentine    FIE:[id number]  DOA: 11/07/2021    Date of Service: the patient was seen and examined on 11/08/2021  Brief hospital course: Patient is a 81 year old male with history of longstanding tobacco use, alcohol use, obesity and chronic diastolic CHF as well as diabetes mellitus, CAD atrial fibrillation who presented to the emergency room on 9/14 for increasing shortness of breath and episodes of bradycardia with heart rate in the 30s.  The emergency room, patient found to have hyper tensive urgency with systolic blood pressure in the 180s, symptomatic bradycardia with heart rate in the 30s and acute on chronic diastolic heart failure.  Patient put on IV hydralazine and Lasix, home p.o. medications on hold and cardiology consulted.  Echocardiogram noting ejection fraction of 50% and difficulty looking at diastolic function given atrial fibrillation.  Patient blood pressure throughout the day has been difficult to control.  Assessment and Plan: Assessment and Plan: * Hypertensive urgency Blood pressure remains markedly elevated with systolic ranging from the 180s to 200s.  Patient complains of a mild headache at times.  Due to bradycardia, Cardizem and clonidine and Coreg on hold.  Have resumed Cozaar.  Getting IV Lasix for heart failure.  Have also started hydralazine and adding Cardura, Norvasc and Imdur.  Symptomatic bradycardia Cardiology following.  Hopefully washout will occur over the next few days.  Cardizem, beta-blocker and clonidine on hold.  Acute on chronic diastolic CHF (congestive heart failure) ([place]) - We will gently diuresis with IV Lasix. - We will continue his Farxiga and Cozaar.  Atrial flutter with controlled response ([place]) Continued on Eliquis.  Chronotropic agents held due to bradycardia as above.  Type 2 diabetes mellitus with chronic kidney disease, without long-term current use of insulin ([name]) Stable state- This is  associated with stage 3B chronic kidney disease. - The patient will be placed on supplement coverage with NovoLog.   Hyperlipidemia Continue statin  Obesity (BMI 30-39.9) Patient meets criteria BMI greater than 30       Body mass index is 30.96 kg/m.        Consultants: Cardiology  Procedures: Echocardiogram  Antimicrobials: None  Code Status: Full code   Subjective: Patient complains of a mild headache  Objective: Noting persistently elevated blood pressures Vitals:   11/08/21 1454 11/08/21 1556  BP: (!) 188/58 (!) 199/68  Pulse:    Resp:  18  Temp:    SpO2:      Intake/Output Summary (Last 24 hours) at 11/08/2021 1728 Last data filed at 11/08/2021 1616 Gross per 24 hour  Intake 480 ml  Output 2900 ml  Net -2420 ml   Filed Weights   11/08/21 0941  Weight: 93.7 kg   Body mass index is 30.96 kg/m.  Exam:  General: Alert and oriented x3, no acute distress HEENT: Normocephalic, atraumatic, mucous membranes are moist Cardiovascular: Irregular rhythm, bradycardic Respiratory: Clear to auscultation bilaterally Abdomen: Soft, nontender, nondistended, positive bowel sounds Musculoskeletal: No clubbing or cyanosis, trace pitting edema Skin: No skin breaks, tears or lesions Psychiatry: Appropriate, no evidence of psychoses Neurology: No focal deficits  Data Reviewed: Creatinine down to 1.63 with GFR 42.  Stable electrolytes and hemoglobin  Disposition:  Status is: Inpatient Remains inpatient appropriate because:  -Further diuresis -Stabilization of blood pressure -Resolution of bradycardia    Anticipated discharge date: 9/17  Family Communication: Left message for family DVT Prophylaxis: apixaban (ELIQUIS) tablet 2.5 mg Start: 11/08/21 2200 apixaban (ELIQUIS) tablet 2.5 mg  Author: Annita Brod ,MD 11/08/2021 5:28 PM  To reach On-call, see care teams to locate the attending and reach out via [email or web address]. Between 7PM-7AM,  please contact night-coverage If you still have difficulty reaching the attending provider, please page the [facility] (Director on Call) for [facility] on amion for assistance.

## 2021-11-08 NOTE — Progress Notes (Signed)
BP 193/66. Notified MD. Verbal order for '20mg'$  Lasix.

## 2021-11-08 NOTE — Progress Notes (Signed)
  Transition of Care [facility]) Screening Note   Patient Details  Name: Todd Valentine Date of Birth: 1940-10-21   Transition of Care [facility]) CM/SW Contact:    Alberteen Sam, LCSW Phone Number: 11/08/2021, 12:07 PM    Transition of Care Department [facility]) has reviewed patient and no TOC needs have been identified at this time. We will continue to monitor patient advancement through interdisciplinary progression rounds. If new patient transition needs arise, please place a TOC consult.  [place], [place]

## 2021-11-08 NOTE — Progress Notes (Signed)
BP 188/58 PRN hydralazine given. BP 199/68 after hydralazine. Notified MD.

## 2021-11-08 NOTE — Discharge Instructions (Signed)

## 2021-11-08 NOTE — Hospital Course (Addendum)
Patient is a 81 year old male with history of longstanding tobacco use, alcohol use, obesity and chronic diastolic CHF as well as diabetes mellitus, CAD atrial fibrillation who presented to the emergency room on 9/14 for increasing shortness of breath and episodes of bradycardia with heart rate in the 30s.  The emergency room, patient found to have hyper tensive urgency with systolic blood pressure in the 180s, symptomatic bradycardia with heart rate in the 30s and acute on chronic diastolic heart failure.  Patient put on IV hydralazine and Lasix, home p.o. medications on hold and cardiology consulted.  Echocardiogram noting ejection fraction of 50% and difficulty looking at diastolic function given atrial fibrillation.  Patient blood pressure throughout the day has been difficult to control.

## 2021-11-08 NOTE — Progress Notes (Signed)
Rounding Note    Patient Name: Todd Valentine Date of Encounter: 11/08/2021  [place] Cardiologist: Ida Rogue, MD   Subjective   Heart rates in the 64s. Patient is feeling a little better, says breathing is short when he lays flat no chest pain.  Inpatient Medications    Scheduled Meds:  apixaban  5 mg Oral BID   dapagliflozin propanediol  10 mg Oral Daily   ezetimibe  10 mg Oral Daily   famotidine  20 mg Oral QHS   furosemide  20 mg Intravenous Q12H   glipiZIDE  5 mg Oral Q breakfast   linagliptin  5 mg Oral Daily   losartan  100 mg Oral Daily   magnesium oxide  400 mg Oral Daily   potassium chloride SA  20 mEq Oral Daily   rosuvastatin  5 mg Oral Daily   tamsulosin  0.4 mg Oral Daily   Continuous Infusions:  PRN Meds: acetaminophen **OR** acetaminophen, hydrALAZINE, magnesium hydroxide, nitroGLYCERIN, ondansetron **OR** ondansetron (ZOFRAN) IV, traZODone   Vital Signs    Vitals:   11/07/21 2236 11/07/21 2326 11/08/21 0106 11/08/21 0338  BP: (!) 218/68 (!) 201/62 (!) 159/57 (!) 166/65  Pulse: (!) 42 (!) 40 (!) 42 (!) 42  Resp: '19 20  16  '$ Temp: 97.8 F (36.6 C) 98 F (36.7 C)  98.1 F (36.7 C)  TempSrc:      SpO2: 98% 98%  97%    Intake/Output Summary (Last 24 hours) at 11/08/2021 0818 Last data filed at 11/08/2021 0500 Gross per 24 hour  Intake --  Output 1450 ml  Net -1450 ml      10/03/2021    2:04 PM 12/10/2020    7:58 AM 04/11/2020    7:26 AM  Last 3 Weights  Weight (lbs) 209 lb 211 lb 2 oz 208 lb  Weight (kg) 94.802 kg 95.766 kg 94.348 kg      Telemetry    Afib rates 40s - Personally Reviewed  ECG    No new - Personally Reviewed  Physical Exam   GEN: No acute distress.   Neck: No JVD Cardiac: Irreg Irreg, bradycardic, + murmur, no rubs, or gallops.  Respiratory: Clear to auscultation bilaterally. GI: Soft, nontender, non-distended  MS: No edema; No deformity. Neuro:  Nonfocal  Psych: Normal affect   Labs     High Sensitivity Troponin:   Recent Labs  Lab 11/07/21 1501 11/07/21 1711  TROPONINIHS 17 21*     Chemistry Recent Labs  Lab 11/07/21 1501 11/08/21 0627  NA 140 142  K 4.1 3.6  CL 106 111  CO2 26 20*  GLUCOSE 181* 150*  BUN 31* 27*  CREATININE 1.76* 1.63*  CALCIUM 9.4 9.2  PROT 7.5  --   ALBUMIN 4.0  --   AST 23  --   ALT 25  --   ALKPHOS 101  --   BILITOT 1.3*  --   GFRNONAA 39* 42*  ANIONGAP 8 11    Lipids No results for input(s): "CHOL", "TRIG", "HDL", "LABVLDL", "LDLCALC", "CHOLHDL" in the last 168 hours.  Hematology Recent Labs  Lab 11/07/21 1501 11/08/21 0627  WBC 6.6 7.9  RBC 5.16 5.14  HGB 15.3 15.3  HCT 46.6 47.6  MCV 90.3 92.6  MCH 29.7 29.8  MCHC 32.8 32.1  RDW 13.4 13.7  PLT 143* 113*   Thyroid No results for input(s): "TSH", "FREET4" in the last 168 hours.  BNPNo results for input(s): "BNP", "PROBNP" in  the last 168 hours.  DDimer No results for input(s): "DDIMER" in the last 168 hours.   Radiology    DG Chest Port 1 View  Result Date: 11/07/2021 CLINICAL DATA:  Chest pain EXAM: PORTABLE CHEST 1 VIEW COMPARISON:  Radiograph 01/17/2020 FINDINGS: Unchanged mild cardiomegaly. There are interstitial opacity. No large pleural effusion. No pneumothorax. No acute osseous abnormality. Bilateral shoulder degenerative changes. Thoracic spondylosis. IMPRESSION: Mild interstitial edema.  Unchanged mild cardiomegaly. Electronically Signed   By: Maurine Simmering M.D.   On: 11/07/2021 15:17    Cardiac Studies   Echo ordered  Echo 2018 -------------------------------------------------------------------  Study Conclusions   - Left ventricle: The cavity size was mildly reduced. There was    mild concentric hypertrophy. Systolic function was normal. The    estimated ejection fraction was in the range of 50% to 55%.    Regional wall motion abnormalities cannot be excluded. The study    is not technically sufficient to allow evaluation of LV diastolic     function.  - Mitral valve: There was mild regurgitation.  - Left atrium: The atrium was mildly dilated.  - Right ventricle: The cavity size was mildly dilated. Wall    thickness was normal. Systolic function was mildly reduced.  - Pulmonary arteries: Systolic pressure could not be accurately    estimated.   Impressions:   - Rate is atrial fibrillation with RVR. Challenging image quality.  Patient Profile     81 y.o. male with a h/o tobacco use, alcohol use, obesity, chronic diastolic CHF, HTN, DM2, CAD by cath in 09/2013, OSA on CPAP, permanent Afib who is being seen for bradycardia.   Assessment & Plan    Symptomatic bradycardia - presented with SOB and fatigue - EKG showed Afib with heart rates in the high 30s - PTA diltiazem, coreg, clonidine held - need washout 48 hours - check Mag and TSH - rates still low in the 40s, continue to monitor on telemetry - echo ordered  Permanent Afib - dilt and Coreg held as above - Continue Eliquis '5mg'$  BID - monitor telemetry  DM2 - per IM  Chronic diastolic CHF - CXR showed pulmonary edema - PTA Farxiga, Losartan and lasix '20mg'$  daily - IV lasix '20mg'$  BID - Net -1.4L - kidney function stable  HTN - Coreg, Dilt and Clonidine held as above - Bps elevated - continue Losartan - start Hydralazine '50mg'$  TID  For questions or updates, please contact [place] Please consult [email or web address] for contact info under        Signed, Evin Loiseau Ninfa Meeker, PA-C  11/08/2021, 8:18 AM

## 2021-11-08 NOTE — Assessment & Plan Note (Signed)
Continue statin. 

## 2021-11-09 ENCOUNTER — Inpatient Hospital Stay: Payer: Medicare Other

## 2021-11-09 ENCOUNTER — Inpatient Hospital Stay (HOSPITAL_COMMUNITY)
Admit: 2021-11-09 | Discharge: 2021-11-09 | Disposition: A | Discharge status: Home / Self Care | Payer: Medicare Other | Attending: Cardiovascular Disease | Admitting: Cardiovascular Disease

## 2021-11-09 DIAGNOSIS — I160 Hypertensive urgency: Secondary | ICD-10-CM | POA: Diagnosis not present

## 2021-11-09 DIAGNOSIS — I5032 Chronic diastolic (congestive) heart failure: Secondary | ICD-10-CM | POA: Diagnosis not present

## 2021-11-09 DIAGNOSIS — I4892 Unspecified atrial flutter: Secondary | ICD-10-CM | POA: Diagnosis not present

## 2021-11-09 DIAGNOSIS — R001 Bradycardia, unspecified: Secondary | ICD-10-CM | POA: Diagnosis not present

## 2021-11-09 DIAGNOSIS — F419 Anxiety disorder, unspecified: Secondary | ICD-10-CM

## 2021-11-09 DIAGNOSIS — I5033 Acute on chronic diastolic (congestive) heart failure: Secondary | ICD-10-CM | POA: Diagnosis not present

## 2021-11-09 LAB — ECHOCARDIOGRAM COMPLETE
AR max vel: 1.38 cm2
AV Area VTI: 1.27 cm2
AV Area mean vel: 1.32 cm2
AV Mean grad: 21 mmHg
AV Peak grad: 41.3 mmHg
Ao pk vel: 3.22 m/s
Area-P 1/2: 3.27 cm2
Calc EF: 90 %
MV VTI: 1.48 cm2
S' Lateral: 2.3 cm
Single Plane A2C EF: 91.5 %
Single Plane A4C EF: 86.4 %
Weight: 3305.6 oz

## 2021-11-09 LAB — BASIC METABOLIC PANEL
Anion gap: 8 (ref 5–15)
BUN: 29 mg/dL — ABNORMAL HIGH (ref 8–23)
CO2: 26 mmol/L (ref 22–32)
Calcium: 9 mg/dL (ref 8.9–10.3)
Chloride: 108 mmol/L (ref 98–111)
Creatinine, Ser: 1.85 mg/dL — ABNORMAL HIGH (ref 0.61–1.24)
GFR, Estimated: 36 mL/min — ABNORMAL LOW (ref >60)
Glucose, Bld: 154 mg/dL — ABNORMAL HIGH (ref 70–99)
Potassium: 3.6 mmol/L (ref 3.5–5.1)
Sodium: 142 mmol/L (ref 135–145)

## 2021-11-09 LAB — GLUCOSE, CAPILLARY: Glucose-Capillary: 160 mg/dL — ABNORMAL HIGH (ref 70–99)

## 2021-11-09 MED ORDER — ALPRAZOLAM 0.25 MG PO TABS
0.2500 mg | ORAL_TABLET | Freq: Three times a day (TID) | ORAL | Status: DC | PRN
  Administered 2021-11-09 – 2021-11-10 (×3): 0.25 mg via ORAL
  Filled 2021-11-09 (×4): qty 1

## 2021-11-09 MED ORDER — PERFLUTREN LIPID MICROSPHERE
1.0000 mL | INTRAVENOUS | Status: AC | PRN
  Administered 2021-11-09: 3 mL via INTRAVENOUS

## 2021-11-09 MED ORDER — FUROSEMIDE 20 MG PO TABS
20.0000 mg | ORAL_TABLET | Freq: Every day | ORAL | Status: DC
  Administered 2021-11-10 – 2021-11-12 (×3): 20 mg via ORAL
  Filled 2021-11-09 (×3): qty 1

## 2021-11-09 MED ORDER — DOXAZOSIN MESYLATE 4 MG PO TABS
8.0000 mg | ORAL_TABLET | Freq: Every day | ORAL | Status: DC
  Administered 2021-11-09 – 2021-11-12 (×4): 8 mg via ORAL
  Filled 2021-11-09: qty 2
  Filled 2021-11-09: qty 1
  Filled 2021-11-09 (×2): qty 2
  Filled 2021-11-09 (×3): qty 1

## 2021-11-09 MED ORDER — AMLODIPINE BESYLATE 10 MG PO TABS
10.0000 mg | ORAL_TABLET | Freq: Every day | ORAL | Status: DC
  Administered 2021-11-10 – 2021-11-12 (×3): 10 mg via ORAL
  Filled 2021-11-09 (×3): qty 1

## 2021-11-09 MED ORDER — ISOSORBIDE MONONITRATE ER 60 MG PO TB24
120.0000 mg | ORAL_TABLET | Freq: Every day | ORAL | Status: DC
  Administered 2021-11-10 – 2021-11-11 (×2): 120 mg via ORAL
  Filled 2021-11-09 (×2): qty 2

## 2021-11-09 NOTE — Assessment & Plan Note (Signed)
Patient had been complaining of feeling very anxious on 9/16, felt to be secondary to rising heart rate as his chronotropic agents were clearing his system.  Given as needed Xanax which she has responded to very well and says he feels much much better.

## 2021-11-09 NOTE — Progress Notes (Signed)
Rounding Note    Patient Name: Todd Valentine Date of Encounter: 11/09/2021  [place] Cardiologist: Ida Rogue, MD   Subjective   HR are better, in the 11s. BP improving with addition of Hydralazine, cardura, Imdur and amlodipine. Echo performed this AM. He reports minimal SOB. No chest pain.   Inpatient Medications    Scheduled Meds:  amLODipine  5 mg Oral BID   apixaban  2.5 mg Oral BID   dapagliflozin propanediol  10 mg Oral Daily   doxazosin  4 mg Oral Q12H   ezetimibe  10 mg Oral Daily   famotidine  20 mg Oral QHS   furosemide  20 mg Intravenous Q12H   glipiZIDE  5 mg Oral Q breakfast   hydrALAZINE  100 mg Oral Q8H   isosorbide mononitrate  60 mg Oral Daily   linagliptin  5 mg Oral Daily   losartan  100 mg Oral Daily   magnesium oxide  400 mg Oral Daily   potassium chloride SA  20 mEq Oral Daily   rosuvastatin  5 mg Oral Daily   tamsulosin  0.4 mg Oral Daily   Continuous Infusions:  PRN Meds: acetaminophen **OR** acetaminophen, hydrALAZINE, magnesium hydroxide, nitroGLYCERIN, ondansetron **OR** ondansetron (ZOFRAN) IV, traZODone   Vital Signs    Vitals:   11/08/21 1556 11/08/21 1940 11/09/21 0013 11/09/21 0445  BP: (!) 199/68 (!) 160/59 (!) 162/55 (!) 145/61  Pulse: (!) 57  (!) 51 (!) 51  Resp: '18 18 18 20  '$ Temp: 97.6 F (36.4 C) 98.1 F (36.7 C) 98.1 F (36.7 C) 98 F (36.7 C)  TempSrc: Oral Oral    SpO2: 96% 97% 98% 94%  Weight:        Intake/Output Summary (Last 24 hours) at 11/09/2021 0729 Last data filed at 11/08/2021 1859 Gross per 24 hour  Intake 720 ml  Output 1950 ml  Net -1230 ml      11/08/2021    9:41 AM 10/03/2021    2:04 PM 12/10/2020    7:58 AM  Last 3 Weights  Weight (lbs) 206 lb 9.6 oz 209 lb 211 lb 2 oz  Weight (kg) 93.713 kg 94.802 kg 95.766 kg      Telemetry    Afib HR 50s, PVCs - Personally Reviewed  ECG    No new - Personally Reviewed  Physical Exam   GEN: No acute distress.   Neck: No  JVD Cardiac: Irreg Irreg, bradycardia, no murmurs, rubs, or gallops.  Respiratory: Clear to auscultation bilaterally. GI: Soft, nontender, non-distended  MS: No edema; No deformity. Neuro:  Nonfocal  Psych: Normal affect   Labs    High Sensitivity Troponin:   Recent Labs  Lab 11/07/21 1501 11/07/21 1711  TROPONINIHS 17 21*     Chemistry Recent Labs  Lab 11/07/21 1501 11/08/21 0627  NA 140 142  K 4.1 3.6  CL 106 111  CO2 26 20*  GLUCOSE 181* 150*  BUN 31* 27*  CREATININE 1.76* 1.63*  CALCIUM 9.4 9.2  MG  --  2.1  PROT 7.5  --   ALBUMIN 4.0  --   AST 23  --   ALT 25  --   ALKPHOS 101  --   BILITOT 1.3*  --   GFRNONAA 39* 42*  ANIONGAP 8 11    Lipids No results for input(s): "CHOL", "TRIG", "HDL", "LABVLDL", "LDLCALC", "CHOLHDL" in the last 168 hours.  Hematology Recent Labs  Lab 11/07/21 1501 11/08/21 0627  WBC  6.6 7.9  RBC 5.16 5.14  HGB 15.3 15.3  HCT 46.6 47.6  MCV 90.3 92.6  MCH 29.7 29.8  MCHC 32.8 32.1  RDW 13.4 13.7  PLT 143* 113*   Thyroid  Recent Labs  Lab 11/08/21 0627  TSH 1.921    BNPNo results for input(s): "BNP", "PROBNP" in the last 168 hours.  DDimer No results for input(s): "DDIMER" in the last 168 hours.   Radiology    DG Chest Port 1 View  Result Date: 11/07/2021 CLINICAL DATA:  Chest pain EXAM: PORTABLE CHEST 1 VIEW COMPARISON:  Radiograph 01/17/2020 FINDINGS: Unchanged mild cardiomegaly. There are interstitial opacity. No large pleural effusion. No pneumothorax. No acute osseous abnormality. Bilateral shoulder degenerative changes. Thoracic spondylosis. IMPRESSION: Mild interstitial edema.  Unchanged mild cardiomegaly. Electronically Signed   By: Maurine Simmering M.D.   On: 11/07/2021 15:17    Cardiac Studies   Echo ordered  Echo 2018 -------------------------------------------------------------------  Study Conclusions   - Left ventricle: The cavity size was mildly reduced. There was    mild concentric hypertrophy.  Systolic function was normal. The    estimated ejection fraction was in the range of 50% to 55%.    Regional wall motion abnormalities cannot be excluded. The study    is not technically sufficient to allow evaluation of LV diastolic    function.  - Mitral valve: There was mild regurgitation.  - Left atrium: The atrium was mildly dilated.  - Right ventricle: The cavity size was mildly dilated. Wall    thickness was normal. Systolic function was mildly reduced.  - Pulmonary arteries: Systolic pressure could not be accurately    estimated.   Impressions:   - Rate is atrial fibrillation with RVR. Challenging image quality.  Patient Profile     81 y.o. male with a h/o tobacco use, alcohol use, obesity, chronic diastolic CHF, HTN, DM2, CAD by cath in 09/2013, OSA on CPAP, permanent Afib who is being seen for bradycardia.  Assessment & Plan    Symptomatic bradycardia - presented with SOB and fatigue, EKG showed Afib with heart rates in the high 30s - PTA diltiazem, coreg, clonidine>>held on admission - washout x 48 hours - Mag and TSH wnl - echo ordered - Heart rates improving, now in the 50s. Patient is overall feeling better, still a little SOB.   Permanent Afib - dilt and Coreg held as above - Continue Eliquis '5mg'$  BID - Heart rates in the 50s, continue to monitor   DM2 - per IM   Chronic diastolic CHF - CXR showed pulmonary edema - PTA Farxiga, Losartan and lasix '20mg'$  daily - IV lasix '20mg'$  BID - Net -2.6L - kidney function mildly elevated, can likely switch to oral lasix '20mg'$  today   HTN - Coreg, Dilt and Clonidine held as above - Bps elevated - continue Losartan - started on Cardura '4mg'$  BID, Hydralazine '100mg'$  TID, Imdur '60mg'$  and amlodipine '5mg'$  daily with improvment  For questions or updates, please contact [place] Please consult [email or web address] for contact info under        Signed, Sky Borboa Ninfa Meeker, PA-C  11/09/2021, 7:29 AM

## 2021-11-09 NOTE — Progress Notes (Signed)
[facility] Progress Note  Patient: Todd Valentine    OBS:[id number]  DOA: 11/07/2021    Date of Service: the patient was seen and examined on 11/09/2021  Brief hospital course: Patient is a 81 year old male with history of longstanding tobacco use, alcohol use, obesity and chronic diastolic CHF as well as diabetes mellitus, CAD atrial fibrillation who presented to the emergency room on 9/14 for increasing shortness of breath and episodes of bradycardia with heart rate in the 30s.  The emergency room, patient found to have hyper tensive urgency with systolic blood pressure in the 180s, symptomatic bradycardia with heart rate in the 30s and acute on chronic diastolic heart failure.  Patient put on IV hydralazine and Lasix, home p.o. medications on hold and cardiology consulted.  Echocardiogram noting ejection fraction of 50% and difficulty looking at diastolic function given atrial fibrillation.  Initially blood pressure difficult to control, but with additional blood pressure medications, has started to come down.  Assessment and Plan: Assessment and Plan: * Hypertensive urgency Blood pressure remains markedly elevated with systolic ranging from the 180s to 200s.  Patient complains of a mild headache at times.  Due to bradycardia, Cardizem and clonidine and Coreg on hold.  Have resumed Cozaar.  Getting IV Lasix for heart failure.  Have also started hydralazine and adding Cardura, Norvasc and Imdur.  Blood pressure improving although still not fully controlled.  Checking renal artery ultrasound.  Have increased Imdur to 120 mg daily  Symptomatic bradycardia Cardiology following.  Heart rate slowly increasing and now in the 50s.  Cardizem, beta-blocker and clonidine on hold.  Acute on chronic diastolic CHF (congestive heart failure) ([name]) - We will gently diurese with IV Lasix, currently has diuresed almost 3.5 L. - We will continue his Farxiga and Cozaar.  Atrial flutter with controlled  response ([place]) Continued on Eliquis.  Chronotropic agents held due to bradycardia as above.  Anxiety Patient feels very anxious today.  He tells me he normally does not have issues with anxiety.  I suspect that he has been so used to having significant bradycardia but as this medicine is leaving his system he is now feeling like his heart is racing which is causing his anxiety although his heart rate is actually in the 50s currently (which had been in the 30s and 40s before medications were stopped.)  Provided reassurance and as needed Xanax.  Type 2 diabetes mellitus with chronic kidney disease, without long-term current use of insulin ([name]) Stable state- This is associated with stage 3B chronic kidney disease. - The patient will be placed on supplement coverage with NovoLog.   Hyperlipidemia Continue statin  Obesity (BMI 30-39.9) Patient meets criteria BMI greater than 30       Body mass index is 30.96 kg/m.        Consultants: Cardiology  Procedures: Echocardiogram pending  Antimicrobials: None  Code Status: Full code   Subjective: Feels anxious  Objective: Noting persistently elevated blood pressures Vitals:   11/09/21 0840 11/09/21 1211  BP: (!) 175/59 (!) 157/61  Pulse: (!) 55 (!) 55  Resp: 20 16  Temp: 98.3 F (36.8 C) 98.3 F (36.8 C)  SpO2: 98% 96%    Intake/Output Summary (Last 24 hours) at 11/09/2021 1639 Last data filed at 11/08/2021 1859 Gross per 24 hour  Intake 240 ml  Output 500 ml  Net -260 ml    Filed Weights   11/08/21 0941  Weight: 93.7 kg   Body mass index is 30.96 kg/m.  Exam:  General: Alert and oriented x3, anxious HEENT: Normocephalic, atraumatic, mucous membranes are moist Cardiovascular: Irregular rhythm, bradycardic (although improved from previous day) Respiratory: Clear to auscultation bilaterally Abdomen: Soft, nontender, nondistended, positive bowel sounds Musculoskeletal: No clubbing or cyanosis, trace pitting  edema Skin: No skin breaks, tears or lesions Psychiatry: Appropriate, no evidence of psychoses Neurology: No focal deficits  Data Reviewed: Creatinine increased to 1.85  Disposition:  Status is: Inpatient Remains inpatient appropriate because:  -Further diuresis -Stabilization of blood pressure -Resolution of bradycardia -Echocardiogram and renal artery ultrasound    Anticipated discharge date: 9/17  Family Communication: Daughter at the bedside DVT Prophylaxis: apixaban (ELIQUIS) tablet 2.5 mg Start: 11/08/21 2200 apixaban (ELIQUIS) tablet 2.5 mg    Author: Annita Brod ,MD 11/09/2021 4:39 PM  To reach On-call, see care teams to locate the attending and reach out via [email or web address]. Between 7PM-7AM, please contact night-coverage If you still have difficulty reaching the attending provider, please page the [facility] (Director on Call) for [facility] on amion for assistance.

## 2021-11-10 ENCOUNTER — Inpatient Hospital Stay: Payer: Medicare Other

## 2021-11-10 DIAGNOSIS — I5033 Acute on chronic diastolic (congestive) heart failure: Secondary | ICD-10-CM | POA: Diagnosis not present

## 2021-11-10 DIAGNOSIS — I160 Hypertensive urgency: Secondary | ICD-10-CM | POA: Diagnosis not present

## 2021-11-10 DIAGNOSIS — R001 Bradycardia, unspecified: Secondary | ICD-10-CM | POA: Diagnosis not present

## 2021-11-10 DIAGNOSIS — I4821 Permanent atrial fibrillation: Secondary | ICD-10-CM | POA: Diagnosis not present

## 2021-11-10 LAB — BASIC METABOLIC PANEL
Anion gap: 8 (ref 5–15)
BUN: 28 mg/dL — ABNORMAL HIGH (ref 8–23)
CO2: 26 mmol/L (ref 22–32)
Calcium: 8.8 mg/dL — ABNORMAL LOW (ref 8.9–10.3)
Chloride: 108 mmol/L (ref 98–111)
Creatinine, Ser: 1.75 mg/dL — ABNORMAL HIGH (ref 0.61–1.24)
GFR, Estimated: 39 mL/min — ABNORMAL LOW (ref >60)
Glucose, Bld: 139 mg/dL — ABNORMAL HIGH (ref 70–99)
Potassium: 3.8 mmol/L (ref 3.5–5.1)
Sodium: 142 mmol/L (ref 135–145)

## 2021-11-10 MED ORDER — GADOBUTROL 1 MMOL/ML IV SOLN
9.0000 mL | Freq: Once | INTRAVENOUS | Status: AC | PRN
  Administered 2021-11-10: 10 mL via INTRAVENOUS

## 2021-11-10 NOTE — Progress Notes (Signed)
[facility] Progress Note  Patient: Todd Valentine    IDP:[id number]  DOA: 11/07/2021    Date of Service: the patient was seen and examined on 11/10/2021  Brief hospital course: Patient is a 81 year old male with history of longstanding tobacco use, alcohol use, obesity and chronic diastolic CHF as well as diabetes mellitus, CAD atrial fibrillation who presented to the emergency room on 9/14 for increasing shortness of breath and episodes of bradycardia with heart rate in the 30s.  The emergency room, patient found to have hyper tensive urgency with systolic blood pressure in the 180s, symptomatic bradycardia with heart rate in the 30s and acute on chronic diastolic heart failure.  Patient put on IV hydralazine and Lasix, home p.o. medications on hold and cardiology consulted.  Echocardiogram noting ejection fraction of 50% and difficulty looking at diastolic function given atrial fibrillation.  Initially blood pressure difficult to control, but with additional blood pressure medications, has started to come down.  Assessment and Plan: Assessment and Plan: * Hypertensive urgency Blood pressure remains markedly elevated with systolic ranging from the 180s to 200s.  Patient complains of a mild headache at times.  Due to bradycardia, Cardizem and clonidine and Coreg on hold.  Have resumed Cozaar.  Getting IV Lasix for heart failure.  Have also started hydralazine and adding Cardura, Norvasc and Imdur.  Blood pressure improving although still not fully controlled.  Have increased Imdur to 120 mg daily.  Renal artery ultrasound Limited study due to bowel gas, but does note peak systolic velocity of 443 cm/s or greater seen in proximal right and mid left renal artery, raising possibility of renal artery stenosis.  We will check MR angiogram to confirm  Symptomatic bradycardia Cardiology following.  Heart rate continues to slowly improve Cardizem, beta-blocker and clonidine on hold.  Acute on  chronic diastolic CHF (congestive heart failure) ([name]) - We will gently diurese with IV Lasix, currently has diuresed almost 4 L.  Creatinine trending downward.  Lasix changed to p.o. - We will continue his Farxiga and Cozaar.  Atrial flutter with controlled response ([place]) Continued on Eliquis.  Chronotropic agents held due to bradycardia as above.  Anxiety Patient had been complaining of feeling very anxious on 9/16, felt to be secondary to rising heart rate as his chronotropic agents were clearing his system.  Given as needed Xanax which she has responded to very well and says he feels much much better.   Type 2 diabetes mellitus with chronic kidney disease, without long-term current use of insulin ([name]) Stable state- This is associated with stage 3B chronic kidney disease. - The patient will be placed on supplement coverage with NovoLog.   Hyperlipidemia Continue statin  Obesity (BMI 30-39.9) Patient meets criteria BMI greater than 30       Body mass index is 30.96 kg/m.        Consultants: Cardiology  Procedures: Echocardiogram notes preserved ejection fraction, indeterminate diastolic function  Antimicrobials: None  Code Status: Full code   Subjective: Feeling better, less anxious  Objective: Noting persistently elevated blood pressures Vitals:   11/10/21 0627 11/10/21 0735  BP: (!) 173/65 (!) 165/64  Pulse:  (!) 51  Resp: 20 16  Temp:  98.3 F (36.8 C)  SpO2:  94%    Intake/Output Summary (Last 24 hours) at 11/10/2021 1348 Last data filed at 11/09/2021 2245 Gross per 24 hour  Intake 120 ml  Output 450 ml  Net -330 ml    Filed Weights   11/08/21  0941  Weight: 93.7 kg   Body mass index is 30.96 kg/m.  Exam:  General: Alert and oriented x3, NAD HEENT: Normocephalic, atraumatic, mucous membranes are moist Cardiovascular: Irregular rhythm, bradycardic (although improved from previous day) Respiratory: Clear to auscultation  bilaterally Abdomen: Soft, nontender, nondistended, positive bowel sounds Musculoskeletal: No clubbing or cyanosis, trace pitting edema Skin: No skin breaks, tears or lesions Psychiatry: Appropriate, no evidence of psychoses Neurology: No focal deficits  Data Reviewed: Creatinine improving  Disposition:  Status is: Inpatient Remains inpatient appropriate because:  -Stabilization of blood pressure -MR Angio    Anticipated discharge date: 9/18  Family Communication: Daughter at the bedside DVT Prophylaxis: apixaban (ELIQUIS) tablet 2.5 mg Start: 11/08/21 2200 apixaban (ELIQUIS) tablet 2.5 mg    Author: Annita Brod ,MD 11/10/2021 1:48 PM  To reach On-call, see care teams to locate the attending and reach out via [email or web address]. Between 7PM-7AM, please contact night-coverage If you still have difficulty reaching the attending provider, please page the [facility] (Director on Call) for [facility] on amion for assistance.

## 2021-11-11 ENCOUNTER — Telehealth: Payer: Self-pay | Admitting: *Deleted

## 2021-11-11 DIAGNOSIS — E669 Obesity, unspecified: Secondary | ICD-10-CM | POA: Diagnosis not present

## 2021-11-11 DIAGNOSIS — I160 Hypertensive urgency: Secondary | ICD-10-CM | POA: Diagnosis not present

## 2021-11-11 DIAGNOSIS — K551 Chronic vascular disorders of intestine: Secondary | ICD-10-CM

## 2021-11-11 DIAGNOSIS — I5033 Acute on chronic diastolic (congestive) heart failure: Secondary | ICD-10-CM | POA: Diagnosis not present

## 2021-11-11 DIAGNOSIS — I701 Atherosclerosis of renal artery: Secondary | ICD-10-CM

## 2021-11-11 LAB — BASIC METABOLIC PANEL
Anion gap: 8 (ref 5–15)
BUN: 29 mg/dL — ABNORMAL HIGH (ref 8–23)
CO2: 24 mmol/L (ref 22–32)
Calcium: 8.5 mg/dL — ABNORMAL LOW (ref 8.9–10.3)
Chloride: 108 mmol/L (ref 98–111)
Creatinine, Ser: 1.65 mg/dL — ABNORMAL HIGH (ref 0.61–1.24)
GFR, Estimated: 42 mL/min — ABNORMAL LOW (ref >60)
Glucose, Bld: 136 mg/dL — ABNORMAL HIGH (ref 70–99)
Potassium: 3.5 mmol/L (ref 3.5–5.1)
Sodium: 140 mmol/L (ref 135–145)

## 2021-11-11 NOTE — Assessment & Plan Note (Signed)
Incidental finding during renal artery stenosis work-up.  Seen by vascular surgery and since patient is asymptomatic at this point, they will follow as outpatient.

## 2021-11-11 NOTE — Progress Notes (Signed)
[facility] Progress Note  Patient: Todd Valentine    GEZ:[id number]  DOA: 11/07/2021    Date of Service: the patient was seen and examined on 11/11/2021  Brief hospital course: Patient is a 81 year old male with history of longstanding tobacco use, alcohol use, obesity and chronic diastolic CHF as well as diabetes mellitus, CAD atrial fibrillation who presented to the emergency room on 9/14 for increasing shortness of breath and episodes of bradycardia with heart rate in the 30s.  The emergency room, patient found to have hyper tensive urgency with systolic blood pressure in the 180s, symptomatic bradycardia with heart rate in the 30s and acute on chronic diastolic heart failure.  Patient put on IV hydralazine and Lasix, home p.o. medications on hold and cardiology consulted.  Echocardiogram noting ejection fraction of 50% and difficulty looking at diastolic function given atrial fibrillation.  Initially blood pressure difficult to control, but with additional blood pressure medications, has started to come down. Work-up is revealed right renal artery stenosis and vascular surgery consulted.  Assessment and Plan: Assessment and Plan: * Hypertensive urgency Blood pressure remains markedly elevated with systolic ranging from the 180s to 200s.  Patient complains of a mild headache at times.  Due to bradycardia, Cardizem and clonidine and Coreg on hold.  Have resumed Cozaar.  Getting IV Lasix for heart failure.  Have also started hydralazine and adding Cardura, Norvasc and Imdur.  Blood pressure improving although still not fully controlled.  Have increased Imdur to 120 mg daily.  Renal artery ultrasound stable renal artery stenosis, confirmed on right by MR angiogram of abdomen/pelvis.  Vascular surgery consulted who saw the patient and plan for work-up including ABI for stent placement.  Symptomatic bradycardia-resolved as of 11/11/2021 Cardiology following.  Cardizem, beta-blocker and clonidine  on hold.  Bradycardia looks to be resolving.  Acute on chronic diastolic CHF (congestive heart failure) ([name]) - We will gently diurese with IV Lasix, currently has diuresed over 4 L and is more than -3 L deficient.  Creatinine trending downward.  Lasix changed to p.o. - We will continue his Farxiga and Cozaar.  Atrial flutter with controlled response ([place]) Continued on Eliquis.  Chronotropic agents held due to bradycardia as above.  Anxiety Patient had been complaining of feeling very anxious on 9/16, felt to be secondary to rising heart rate as his chronotropic agents were clearing his system.  Given as needed Xanax which she has responded to very well and says he feels much much better.   Mesenteric artery stenosis ([name]) Incidental finding during renal artery stenosis work-up.  Seen by vascular surgery and since patient is asymptomatic at this point, they will follow as outpatient.  Type 2 diabetes mellitus with chronic kidney disease, without long-term current use of insulin ([name]) Stable state- This is associated with stage 3B chronic kidney disease. - The patient will be placed on supplement coverage with NovoLog.   Hyperlipidemia Continue statin  Obesity (BMI 30-39.9) Patient meets criteria BMI greater than 30       Body mass index is 30.96 kg/m.        Consultants: Cardiology Vascular surgery  Procedures: Echocardiogram noting preserved ejection fraction, indeterminate diastolic function with moderate aortic valve stenosis. ABIs pending  Antimicrobials: None  Code Status: Full code   Subjective: Patient feels better than he has in a long time.  Objective: Blood pressures improving. Vitals:   11/11/21 0926 11/11/21 1159  BP: (!) 153/89 (!) 147/103  Pulse: (!) 124 86  Resp: 20  17  Temp: 97.7 F (36.5 C) 98.1 F (36.7 C)  SpO2: 97% 98%    Intake/Output Summary (Last 24 hours) at 11/11/2021 1731 Last data filed at 11/11/2021 1427 Gross per 24 hour   Intake 600 ml  Output 725 ml  Net -125 ml    Filed Weights   11/08/21 0941  Weight: 93.7 kg   Body mass index is 30.96 kg/m.  Exam:  General: Alert and oriented x3, anxious HEENT: Normocephalic, atraumatic, mucous membranes are moist Cardiovascular: Irregular rhythm, borderline bradycardia Respiratory: Clear to auscultation bilaterally Abdomen: Soft, nontender, nondistended, positive bowel sounds Musculoskeletal: No clubbing or cyanosis, trace pitting edema Skin: No skin breaks, tears or lesions Psychiatry: Appropriate, no evidence of psychoses Neurology: No focal deficits  Data Reviewed: Creatinine trending downward  Disposition:  Status is: Inpatient Remains inpatient appropriate because:  -Further diuresis -Stabilization of blood pressure -Treatment of renal artery stenosis  Anticipated discharge date: 9/20  Family Communication: Daughter at the bedside DVT Prophylaxis: apixaban (ELIQUIS) tablet 2.5 mg Start: 11/08/21 2200 apixaban (ELIQUIS) tablet 2.5 mg    Author: Annita Brod ,MD 11/11/2021 5:31 PM  To reach On-call, see care teams to locate the attending and reach out via [email or web address]. Between 7PM-7AM, please contact night-coverage If you still have difficulty reaching the attending provider, please page the [facility] (Director on Call) for [facility] on amion for assistance.

## 2021-11-11 NOTE — Telephone Encounter (Signed)
-----   Message from [place], PA-C sent at 11/09/2021  9:31 AM EDT ----- Regarding: hosp follow-up PT needs hospital follow-up in 2-3 weeks

## 2021-11-11 NOTE — Care Management Important Message (Signed)
Important Message  Patient Details  Name: Todd Valentine MRN: [id number] Date of Birth: [birth year]   Medicare Important Message Given:  Yes     [name] 11/11/2021, 12:27 PM

## 2021-11-11 NOTE — Consult Note (Addendum)
[facility] Consult Note  MRN : [id number]  Todd Valentine is a 81 y.o. (02-Dec-1940) male who presents with chief complaint of  Chief Complaint  Patient presents with   Hypertension  .   Consulting Physician: Gevena Barre, MD Reason for consult: Renal Artery Stenosis  History of Present Illness: Todd Valentine is a 81 year old male with a past medical history of tobacco use, HFpEF, diabetes mellitus, atrial fibrillation after noting that he had elevated blood pressures in the 180s, in addition to a heart rate in the 30s.  With adjustment of blood pressure medications his blood pressure has come down and become regulated.  In the midst of work-up it was found that the patient had a greater than 50% stenosis via ultrasound.  Based on the systolic velocities in the proximal right renal artery with a mid left renal artery.  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  He also endorses having claudication symptoms after walking 3 to 400 yards but he denies any rest pain or ulceration.  Current Facility-Administered Medications  Medication Dose Route Frequency Provider Last Rate Last Admin   acetaminophen (TYLENOL) tablet 650 mg  650 mg Oral Q6H PRN Mansy, Jan A, MD       Or   acetaminophen (TYLENOL) suppository 650 mg  650 mg Rectal Q6H PRN Mansy, Jan A, MD       ALPRAZolam Duanne Moron) tablet 0.25 mg  0.25 mg Oral TID PRN Annita Brod, MD   0.25 mg at 11/10/21 2322   amLODipine (NORVASC) tablet 10 mg  10 mg Oral Daily Sueanne Margarita, MD   10 mg at 11/11/21 1023   apixaban (ELIQUIS) tablet 2.5 mg  2.5 mg Oral BID Annita Brod, MD   2.5 mg at 11/11/21 1023   dapagliflozin propanediol (FARXIGA) tablet 10 mg  10 mg Oral Daily Mansy, Jan A, MD   10 mg at 11/11/21  1019   doxazosin (CARDURA) tablet 8 mg  8 mg Oral QHS Fransico Him R, MD   8 mg at 11/10/21 2231   ezetimibe (ZETIA) tablet 10 mg  10 mg Oral Daily Mansy, Jan A, MD   10 mg at 11/11/21 1019   famotidine (PEPCID) tablet 20 mg  20 mg Oral QHS Mansy, Jan A, MD   20 mg at 11/10/21 2136   furosemide (LASIX) tablet 20 mg  20 mg Oral Daily Turner, Traci R, MD   20 mg at 11/11/21 1023   glipiZIDE (GLUCOTROL XL) 24 hr tablet 5 mg  5 mg Oral Q breakfast Mansy, Jan A, MD   5 mg at 11/11/21 1022   hydrALAZINE (APRESOLINE) injection 10 mg  10 mg Intravenous Q6H PRN Mansy, Jan A, MD   10 mg at 11/09/21 0915   hydrALAZINE (APRESOLINE) tablet 100 mg  100 mg Oral Q8H Gevena Barre K, MD   100 mg at 11/11/21 1423   isosorbide mononitrate (IMDUR) 24 hr tablet 120 mg  120 mg Oral Daily Gevena Barre K, MD   120 mg at 11/11/21 1019   linagliptin (TRADJENTA) tablet 5 mg  5 mg Oral Daily Mansy, Jan A, MD   5 mg at 11/10/21 1002   losartan (COZAAR) tablet 100 mg  100 mg Oral Daily Mansy, Jan A, MD   100 mg at  11/11/21 1020   magnesium hydroxide (MILK OF MAGNESIA) suspension 30 mL  30 mL Oral Daily PRN Mansy, Jan A, MD       magnesium oxide (MAG-OX) tablet 400 mg  400 mg Oral Daily Mansy, Jan A, MD   400 mg at 11/11/21 1018   nitroGLYCERIN (NITROSTAT) SL tablet 0.4 mg  0.4 mg Sublingual Q5 min PRN Mansy, Jan A, MD       ondansetron [facility]) tablet 4 mg  4 mg Oral Q6H PRN Mansy, Jan A, MD       Or   ondansetron [facility]) injection 4 mg  4 mg Intravenous Q6H PRN Mansy, Jan A, MD       potassium chloride SA (KLOR-CON M) CR tablet 20 mEq  20 mEq Oral Daily Mansy, Jan A, MD   20 mEq at 11/11/21 1022   rosuvastatin (CRESTOR) tablet 5 mg  5 mg Oral Daily Mansy, Jan A, MD   5 mg at 11/11/21 1022   tamsulosin (FLOMAX) capsule 0.4 mg  0.4 mg Oral Daily Mansy, Jan A, MD   0.4 mg at 11/11/21 1023   traZODone (DESYREL) tablet 50 mg  50 mg Oral QHS PRN Mansy, Jan A, MD   50 mg at 11/10/21 2231    Past Medical History:   Diagnosis Date   Acute renal failure ([name])    BPH (benign prostatic hyperplasia)    Chronic atrial fibrillation ([name])    Chronic diastolic CHF (congestive heart failure) ([place])    a. echo as above   Chronic insomnia    Coronary artery disease, non-occlusive 10/06/2013   a. cath 09/2013: pLAD 40, CTO Diag, ramus 40-->80, OM 2 mRCA 46, med rx   Depression    Diabetes mellitus with complication ([place])    Diverticulitis of colon    Dysrhythmia    atrial fibrillation   Heart murmur    systolic murmur, unspecified   History of diverticulitis    Hypercholesterolemia    Hyperkalemia    Hyperlipidemia    Hyperlipidemia    Hypertension    Hypokalemia    Hypomagnesemia    Malignant melanoma ([place])    Melanoma ([place])    Mitral regurgitation    a. echo 09/2013: EF 55-60%, mildly dilated left atrrium, mild to moderate MR/TR   Sleep apnea in adult     Past Surgical History:  Procedure Laterality Date   APPENDECTOMY     CARDIAC CATHETERIZATION  10/06/2013   COLONOSCOPY     COLONOSCOPY WITH PROPOFOL N/A 11/23/2014   Procedure: COLONOSCOPY WITH PROPOFOL;  Surgeon: Manya Silvas, MD;  Location: [facility] ENDOSCOPY;  Service: Endoscopy;  Laterality: N/A;   COLONOSCOPY WITH PROPOFOL N/A 04/11/2020   Procedure: COLONOSCOPY WITH PROPOFOL;  Surgeon: Toledo, Benay Pike, MD;  Location: [facility] ENDOSCOPY;  Service: Gastroenterology;  Laterality: N/A;  DM  ELIQUIS   MOHS SURGERY     removal tubular adenoma      Social History Social History   Tobacco Use   Smoking status: Former    Packs/day: 3.00    Years: 30.00    Total pack years: 90.00    Types: Cigarettes    Quit date: 1989    Years since quitting: 34.7   Smokeless tobacco: Never  Vaping Use   Vaping Use: Never used  Substance Use Topics   Alcohol use: Yes    Comment: "occassionally have a few drinks"   Drug use: Not Currently    Family History Family History  Family history unknown:  Yes    Allergies  Allergen Reactions    Hydrocodone-Acetaminophen Other (See Comments)   Aspirin Other (See Comments)    Contraindicated - Kidney disease Other reaction(s): Other (See Comments), Other (See Comments) Contraindicated - Kidney disease Contraindicated - Kidney disease   Ciprofloxacin Other (See Comments)    Other reaction(s): Pt states he could hardly move Other reaction(s): Other (See Comments), Other (See Comments) Pt states he could hardly move Other reaction(s): Other (See Comments) Pt states he could hardly move Other reaction(s): Pt states he could hardly move Pt states he could hardly move   Sitagliptin Other (See Comments)    Other reaction(s): Weakness  Other reaction(s): Other (See Comments), Other (See Comments) Weakness Other reaction(s): Other (See Comments) Weakness Other reaction(s): Weakness Weakness     REVIEW OF SYSTEMS (Negative unless checked)  Constitutional: '[]'$ Weight loss  '[]'$ Fever  '[]'$ Chills Cardiac: '[]'$ Chest pain   '[]'$ Chest pressure   '[]'$ Palpitations   '[]'$ Shortness of breath when laying flat   '[]'$ Shortness of breath at rest   '[]'$ Shortness of breath with exertion. Vascular:  '[]'$ Pain in legs with walking   '[]'$ Pain in legs at rest   '[]'$ Pain in legs when laying flat   '[x]'$ Claudication   '[]'$ Pain in feet when walking  '[]'$ Pain in feet at rest  '[]'$ Pain in feet when laying flat   '[]'$ History of DVT   '[]'$ Phlebitis   '[]'$ Swelling in legs   '[]'$ Varicose veins   '[]'$ Non-healing ulcers Pulmonary:   '[]'$ Uses home oxygen   '[]'$ Productive cough   '[]'$ Hemoptysis   '[]'$ Wheeze  '[]'$ COPD   '[]'$ Asthma Neurologic:  '[]'$ Dizziness  '[]'$ Blackouts   '[]'$ Seizures   '[]'$ History of stroke   '[]'$ History of TIA  '[]'$ Aphasia   '[]'$ Temporary blindness   '[]'$ Dysphagia   '[]'$ Weakness or numbness in arms   '[]'$ Weakness or numbness in legs Musculoskeletal:  '[]'$ Arthritis   '[]'$ Joint swelling   '[]'$ Joint pain   '[]'$ Low back pain Hematologic:  '[]'$ Easy bruising  '[]'$ Easy bleeding   '[]'$ Hypercoagulable state   '[]'$ Anemic  '[]'$ Hepatitis Gastrointestinal:  '[]'$ Blood in stool   '[]'$ Vomiting blood   '[]'$ Gastroesophageal reflux/heartburn   '[]'$ Difficulty swallowing. Genitourinary:  '[]'$ Chronic kidney disease   '[]'$ Difficult urination  '[]'$ Frequent urination  '[]'$ Burning with urination   '[]'$ Blood in urine Skin:  '[]'$ Rashes   '[]'$ Ulcers   '[]'$ Wounds Psychological:  '[]'$ History of anxiety   '[]'$  History of major depression.  Physical Examination  Vitals:   11/10/21 2322 11/11/21 0439 11/11/21 0926 11/11/21 1159  BP: (!) 161/65 (!) 150/71 (!) 153/89 (!) 147/103  Pulse: 100 (!) 45 (!) 124 86  Resp: '18 20 20 17  '$ Temp: 97.8 F (36.6 C) 98.4 F (36.9 C) 97.7 F (36.5 C) 98.1 F (36.7 C)  TempSrc:   Axillary Oral  SpO2: 94% 94% 97% 98%  Weight:       Body mass index is 30.96 kg/m. Gen:  WD/WN, NAD Head: [place]/AT, No temporalis wasting. Prominent temp pulse not noted. Ear/Nose/Throat: Hearing grossly intact, nares w/o erythema or drainage, oropharynx w/o Erythema/Exudate Eyes: Sclera non-icteric, conjunctiva clear Neck: Trachea midline.  No JVD.  Pulmonary:  Good air movement, respirations not labored, equal bilaterally.  Cardiac: RRR, normal S1, S2. Vascular:  Vessel Right Left  PT Not Palpable Not Palpable  DP Not Palpable Not Palpable   Gastrointestinal: soft, non-tender/non-distended. No guarding/reflex.  Musculoskeletal: M/S 5/5 throughout.  Extremities without ischemic changes.  No deformity or atrophy. No edema. Neurologic: Sensation grossly intact in extremities.  Symmetrical.  Speech is fluent. Motor exam as listed above. Psychiatric: Judgment intact, Mood &  affect appropriate for pt's clinical situation. Dermatologic: No rashes or ulcers noted.  No cellulitis or open wounds. Lymph : No Cervical, Axillary, or Inguinal lymphadenopathy.    CBC Lab Results  Component Value Date   WBC 7.9 11/08/2021   HGB 15.3 11/08/2021   HCT 47.6 11/08/2021   MCV 92.6 11/08/2021   PLT 113 (L) 11/08/2021    BMET    Component Value Date/Time   NA 140 11/11/2021 0552   NA 143 08/07/2014 0838   NA 134  (L) 10/06/2013 0728   K 3.5 11/11/2021 0552   K 3.2 (L) 10/06/2013 0728   CL 108 11/11/2021 0552   CL 99 10/06/2013 0728   CO2 24 11/11/2021 0552   CO2 29 10/06/2013 0728   GLUCOSE 136 (H) 11/11/2021 0552   GLUCOSE 161 (H) 10/06/2013 0728   BUN 29 (H) 11/11/2021 0552   BUN 20 08/07/2014 0838   BUN 30 (H) 10/06/2013 0728   CREATININE 1.65 (H) 11/11/2021 0552   CREATININE 1.56 (H) 10/06/2013 0728   CALCIUM 8.5 (L) 11/11/2021 0552   CALCIUM 8.6 10/06/2013 0728   GFRNONAA 42 (L) 11/11/2021 0552   GFRNONAA 44 (L) 10/06/2013 0728   GFRAA 49 (L) 09/29/2018 0328   GFRAA 51 (L) 10/06/2013 0728   Estimated Creatinine Clearance: 40 mL/min (A) (by C-G formula based on SCr of 1.65 mg/dL (H)).  COAG Lab Results  Component Value Date   INR 1.54 05/08/2016   INR 1.7 10/06/2013   INR 2.0 10/05/2013    Radiology MR ANGIO ABDOMEN W WO CONTRAST  Result Date: 11/11/2021 CLINICAL DATA:  81 year old male with hypertension EXAM: MRA ABDOMEN AND PELVIS WITH CONTRAST TECHNIQUE: Multiplanar, multiecho pulse sequences of the abdomen and pelvis were obtained with intravenous contrast. Angiographic images of abdomen and pelvis were obtained using MRA technique with intravenous contrast. CONTRAST:  80m GADAVIST GADOBUTROL 1 MMOL/ML IV SOLN COMPARISON:  None FINDINGS: MRA ABDOMEN/pelvis FINDINGS Vascular: Aorta: Diameter at the hiatus measures 2.4 cm. Normal course caliber and contour. Mild atherosclerotic plaque in the lower abdominal aorta without significant calcifications. No wall thickening or periaortic fluid. Celiac artery: Maintained flow signal with no significant atherosclerotic plaque or narrowing by MR imaging. Typical branch pattern with flow signal maintained in the branches. SMA: At least 50% narrowing at the origin of the SMA secondary to presumed atherosclerotic plaque. Maintain flow signal within the branch vessels. Renal arteries: Right: No significant stenosis at the origin the right renal  artery. There is at least 50% narrowing in the mid segment of the renal artery. No pre hilar branches identified. Left renal artery: Flow signal maintained at the origin. Evidence of atherosclerotic plaque at the renal artery origin, with less than 50% narrowing. No pre hilar branches identified. Right lower extremity: Normal course caliber and contour of the right iliac system. Flow signal maintained with no high-grade stenosis or occlusion. Hypogastric artery maintains flow signal at the origin though appears narrowed. Common femoral artery patent without high-grade stenosis. Proximal profunda femoris and SFA maintained flow. Left lower extremity: Normal course caliber and contour of the left iliac system. Flow signal maintained with no evidence of high-grade stenosis or occlusion. Hypogastric artery demonstrates maintained flow signal. Common femoral artery demonstrates no stenosis. Proximal profunda femoris and SFA demonstrate maintained flow signal. Venous: Unremarkable hepatic venous and portal venous system. Unremarkable IVC and iliac veins.  Unremarkable renal veins. Nonvascular: No acute finding at the lung base Hepatic biliary: Uniform signal of the liver parenchyma. Unremarkable gallbladder. Pancreas:  Uniform signal Spleen: Uniform signal, not enlarged. Kidneys/adrenal glands: Unremarkable adrenal glands No hydronephrosis with symmetric size of the kidneys. Gastrointestinal: Unremarkable small bowel and stomach. Colonic diverticula. No inflammatory changes identified by MR imaging. Mesentery/lymphatic: Unremarkable Musculoskeletal: Multilevel degenerative changes. Fat signal lesion within L3, compatible with osseous hemangioma. Pelvic structures: Unremarkable Additional: None IMPRESSION: MR angiogram demonstrates single renal arteries bilaterally, with greater than 50% stenosis in the mid right renal artery, and no high-grade stenosis on the left. 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 Aortic Atherosclerosis (ICD10-I70.0). Additional ancillary findings as above. Signed, Dulcy Fanny. Nadene Rubins, RPVI [facility] Electronically Signed   By: [name]   On: 11/11/2021 08:56   MR ANGIO PELVIS W WO CONTRAST  Result Date: 11/11/2021 CLINICAL DATA:  81 year old male with hypertension EXAM: MRA ABDOMEN AND PELVIS WITH CONTRAST TECHNIQUE: Multiplanar, multiecho pulse sequences of the abdomen and pelvis were obtained with intravenous contrast. Angiographic images of abdomen and pelvis were obtained using MRA technique with intravenous contrast. CONTRAST:  63m GADAVIST GADOBUTROL 1 MMOL/ML IV SOLN COMPARISON:  None FINDINGS: MRA ABDOMEN/pelvis FINDINGS Vascular: Aorta: Diameter at the hiatus measures 2.4 cm. Normal course caliber and contour. Mild atherosclerotic plaque in the lower abdominal aorta without significant calcifications. No wall thickening or periaortic fluid. Celiac artery: Maintained flow signal with no significant atherosclerotic plaque or narrowing by MR imaging. Typical branch pattern with flow signal maintained in the branches. SMA: At least 50% narrowing at the origin of the SMA secondary to presumed atherosclerotic plaque. Maintain flow signal within the branch vessels. Renal arteries: Right: No significant stenosis at the origin the right renal artery. There is at least 50% narrowing in the mid segment of the renal artery. No pre hilar branches identified. Left renal artery: Flow signal maintained at the origin. Evidence of atherosclerotic plaque at the renal artery origin, with less than 50% narrowing. No pre hilar branches identified. Right lower extremity: Normal course caliber and contour of the right iliac system. Flow signal maintained with no high-grade stenosis or occlusion. Hypogastric artery maintains flow signal at the origin  though appears narrowed. Common femoral artery patent without high-grade stenosis. Proximal profunda femoris and SFA maintained flow. Left lower extremity: Normal course caliber and contour of the left iliac system. Flow signal maintained with no evidence of high-grade stenosis or occlusion. Hypogastric artery demonstrates maintained flow signal. Common femoral artery demonstrates no stenosis. Proximal profunda femoris and SFA demonstrate maintained flow signal. Venous: Unremarkable hepatic venous and portal venous system. Unremarkable IVC and iliac veins.  Unremarkable renal veins. Nonvascular: No acute finding at the lung base Hepatic biliary: Uniform signal of the liver parenchyma. Unremarkable gallbladder. Pancreas: Uniform signal Spleen: Uniform signal, not enlarged. Kidneys/adrenal glands: Unremarkable adrenal glands No hydronephrosis with symmetric size of the kidneys. Gastrointestinal: Unremarkable small bowel and stomach. Colonic diverticula. No inflammatory changes identified by MR imaging. Mesentery/lymphatic: Unremarkable Musculoskeletal: Multilevel degenerative changes. Fat signal lesion within L3, compatible with osseous hemangioma. Pelvic structures: Unremarkable Additional: None IMPRESSION: MR angiogram demonstrates single renal arteries bilaterally, with greater than 50% stenosis in the mid right renal artery, and no high-grade stenosis on the left. Mesenteric arterial disease with greater than 50% narrowing at the origin of the SMA, and associated aortic atherosclerosis. Aortic Atherosclerosis (ICD10-I70.0). Additional ancillary findings as above. Signed, JDulcy Fanny WNadene Rubins RPVI [facility] Electronically Signed  By: [name]   On: 11/11/2021 08:56   US RENAL ARTERY DUPLEX COMPLETE  Result Date: 11/10/2021 CLINICAL DATA:  Hypertension EXAM: RENAL/URINARY TRACT ULTRASOUND RENAL DUPLEX DOPPLER ULTRASOUND COMPARISON:   None Available. FINDINGS: Right Kidney: Length: 9.9 cm. Echogenicity within normal limits. No mass or hydronephrosis visualized. Left Kidney: Length: 12.3 cm. Echogenicity within normal limits. No mass or hydronephrosis visualized. Bladder:  Within normal limits RENAL DUPLEX ULTRASOUND Right Renal Artery Velocities: Origin:  200 cm/sec Mid:  96 cm/sec Hilum:  173 cm/sec Interlobar:  53 cm/sec Arcuate:  32 cm/sec Left Renal Artery Velocities: Origin:  Not visualized Mid:  190 cm/sec Hilum:  230 cm/sec Interlobar:  51 cm/sec Arcuate:  36 cm/sec Aortic Velocity:  120 cm/sec Right Renal-Aortic Ratios: Origin: 1.7 Mid:  0.8 Hilum: 1.4 Interlobar: 0.4 Arcuate: 0.3 Left Renal-Aortic Ratios: Origin: Not seen Mid: 1.6 Hilum: 1.9 Interlobar: 0.4 Arcuate: 0.3 IMPRESSION: 1. Unfortunately limited diagnostic quality of exam secondary to excessive bowel gas. However, measurements of peak systolic velocity 627 centimeters/second or greater are visualized in the proximal right renal artery and the mid left renal artery. This raises the possibility of underlying renal artery stenosis. Consider further evaluation with CT or MR angiography. 2. Sonographically unremarkable appearance of the kidneys. Electronically Signed   By: Jacqulynn Cadet M.D.   On: 11/10/2021 07:08   ECHOCARDIOGRAM COMPLETE  Result Date: 11/09/2021    ECHOCARDIOGRAM REPORT   Patient Name:   Todd Valentine Date of Exam: 11/09/2021 Medical Rec #:  [id number]        Height:       68.5 in Accession #:    [id number]       Weight:       206.6 lb Date of Birth:  1940-08-23        BSA:          2.083 m Patient Age:    43 years         BP:           145/61 mmHg Patient Gender: M                HR:           52 bpm. Exam Location:  [facility] Procedure: 2D Echo and Intracardiac Opacification Agent Indications:     Diastolic CHF  History:         Patient has prior history of Echocardiogram examinations. CAD,                  Arrythmias:Atrial Fibrillation; Risk  Factors:Hypertension,                  Dyslipidemia and Diabetes.  Sonographer:     L Thornton-Maynard Referring Phys:  Eugenie Norrie, A Diagnosing Phys: Fransico Him MD  Sonographer Comments: Suboptimal apical window. IMPRESSIONS  1. Left ventricular ejection fraction, by estimation, is 70 to 75%. The left ventricle has hyperdynamic function. The left ventricle has no regional wall motion abnormalities. There is mild asymmetric left ventricular hypertrophy. Left ventricular diastolic function could not be evaluated. Elevated left ventricular end-diastolic pressure.  2. Right ventricular systolic function is normal. The right ventricular size is normal. Tricuspid regurgitation signal is inadequate for assessing PA pressure.  3. Right atrial size was moderately dilated.  4. The mitral valve is degenerative. No evidence of mitral valve regurgitation. No evidence of mitral stenosis. The mean mitral valve gradient is 3.0 mmHg.  5. The aortic valve is calcified. Aortic valve regurgitation  is trivial. Moderate aortic valve stenosis. Aortic valve area, by VTI measures 1.27 cm. Aortic valve mean gradient measures 21.0 mmHg. Aortic valve Vmax measures 3.22 m/s.  6. Aortic dilatation noted. There is borderline dilatation of the aortic root, measuring 37 mm.  7. The inferior vena cava is dilated in size with >50% respiratory variability, suggesting right atrial pressure of 8 mmHg.  8. Evidence of atrial level shunting detected by color flow Doppler. There is a small patent foramen ovale with bidirectional shunting across atrial septum. FINDINGS  Left Ventricle: Left ventricular ejection fraction, by estimation, is 70 to 75%. The left ventricle has hyperdynamic function. The left ventricle has no regional wall motion abnormalities. Definity contrast agent was given IV to delineate the left ventricular endocardial borders. The left ventricular internal cavity size was normal in size. There is mild asymmetric left ventricular  hypertrophy. Left ventricular diastolic function could not be evaluated. Elevated left ventricular end-diastolic pressure. Right Ventricle: The right ventricular size is normal. No increase in right ventricular wall thickness. Right ventricular systolic function is normal. Tricuspid regurgitation signal is inadequate for assessing PA pressure. Left Atrium: Left atrial size was normal in size. Right Atrium: Right atrial size was moderately dilated. Pericardium: There is no evidence of pericardial effusion. Mitral Valve: The mitral valve is degenerative in appearance. There is mild thickening of the mitral valve leaflet(s). There is moderate calcification of the mitral valve leaflet(s). Mild to moderate mitral annular calcification. No evidence of mitral valve regurgitation. No evidence of mitral valve stenosis. MV peak gradient, 13.4 mmHg. The mean mitral valve gradient is 3.0 mmHg. Tricuspid Valve: The tricuspid valve is normal in structure. Tricuspid valve regurgitation is not demonstrated. No evidence of tricuspid stenosis. Aortic Valve: The aortic valve is calcified. Aortic valve regurgitation is trivial. Moderate aortic stenosis is present. Aortic valve mean gradient measures 21.0 mmHg. Aortic valve peak gradient measures 41.3 mmHg. Aortic valve area, by VTI measures 1.27  cm. Pulmonic Valve: The pulmonic valve was normal in structure. Pulmonic valve regurgitation is not visualized. No evidence of pulmonic stenosis. Aorta: Aortic dilatation noted. There is borderline dilatation of the aortic root, measuring 37 mm. Venous: The inferior vena cava is dilated in size with greater than 50% respiratory variability, suggesting right atrial pressure of 8 mmHg. IAS/Shunts: The interatrial septum appears to be lipomatous. Evidence of atrial level shunting detected by color flow Doppler. A small patent foramen ovale is detected with bidirectional shunting across atrial septum.  LEFT VENTRICLE PLAX 2D LVIDd:         4.90  cm     Diastology LVIDs:         2.30 cm     LV e' medial:    4.90 cm/s LV PW:         1.00 cm     LV E/e' medial:  31.4 LV IVS:        1.40 cm     LV e' lateral:   8.16 cm/s LVOT diam:     2.20 cm     LV E/e' lateral: 18.9 LV SV:         81 LV SV Index:   39 LVOT Area:     3.80 cm  LV Volumes (MOD) LV vol d, MOD A2C: 40.5 ml LV vol d, MOD A4C: 44.0 ml LV vol s, MOD A2C: 3.4 ml LV vol s, MOD A4C: 6.0 ml LV SV MOD A2C:     37.1 ml LV SV MOD A4C:  44.0 ml LV SV MOD BP:      38.7 ml RIGHT VENTRICLE RV S prime:     10.90 cm/s TAPSE (M-mode): 2.5 cm LEFT ATRIUM             Index        RIGHT ATRIUM           Index LA diam:        4.30 cm 2.06 cm/m   RA Area:     26.80 cm LA Vol (A2C):   57.1 ml 27.41 ml/m  RA Volume:   83.60 ml  40.13 ml/m LA Vol (A4C):   59.5 ml 28.56 ml/m LA Biplane Vol: 62.8 ml 30.14 ml/m  AORTIC VALVE                     PULMONIC VALVE AV Area (Vmax):    1.38 cm      PV Vmax:       1.77 m/s AV Area (Vmean):   1.32 cm      PV Vmean:      129.000 cm/s AV Area (VTI):     1.27 cm      PV VTI:        0.350 m AV Vmax:           321.50 cm/s   PV Peak grad:  12.5 mmHg AV Vmean:          208.500 cm/s  PV Mean grad:  7.0 mmHg AV VTI:            0.633 m AV Peak Grad:      41.3 mmHg AV Mean Grad:      21.0 mmHg LVOT Vmax:         117.00 cm/s LVOT Vmean:        72.300 cm/s LVOT VTI:          0.212 m LVOT/AV VTI ratio: 0.33  AORTA Ao Root diam: 3.70 cm MITRAL VALVE MV Area (PHT): 3.27 cm     SHUNTS MV Area VTI:   1.48 cm     Systemic VTI:  0.21 m MV Peak grad:  13.4 mmHg    Systemic Diam: 2.20 cm MV Mean grad:  3.0 mmHg MV Vmax:       1.83 m/s MV Vmean:      72.4 cm/s MV Decel Time: 232 msec MV E velocity: 154.00 cm/s Fransico Him MD Electronically signed by Fransico Him MD Signature Date/Time: 11/09/2021/10:42:15 PM    Final    DG Chest Port 1 View  Result Date: 11/07/2021 CLINICAL DATA:  Chest pain EXAM: PORTABLE CHEST 1 VIEW COMPARISON:  Radiograph 01/17/2020 FINDINGS: Unchanged mild  cardiomegaly. There are interstitial opacity. No large pleural effusion. No pneumothorax. No acute osseous abnormality. Bilateral shoulder degenerative changes. Thoracic spondylosis. IMPRESSION: Mild interstitial edema.  Unchanged mild cardiomegaly. Electronically Signed   By: Maurine Simmering M.D.   On: 11/07/2021 15:17      Assessment/Plan 1.  Renal artery stenosis  The patient has had hypertensive urgency with systolic blood pressure in the 180s to 200s.  This has been improved with treatment while inpatient but given the patient's greater than 50% renal artery stenosis.  The ultrasound indicates elevated velocities in the proximal right renal artery as well as the mid left renal artery.  We will have the patient undergo angiogram for evaluation possible treatment of stenosis.  We have discussed the procedures as well as the risk, benefits and alternatives and  the patient is agreeable to proceed.  We will make the patient n.p.o. on Tuesday with planned intervention on Wednesday.  2.  Superior mesenteric artery stenosis The patient had incidental finding of a greater than 50% stenosis of the SMA however he currently does not have any symptoms including food phobia, unintended weight loss, nausea vomiting or abdominal pain.  Based on this we will continue to follow this in the outpatient setting.  3.  Claudication The patient has significant atherosclerotic risk factors.  Following discussion with the patient he does have leg pain that is suspicious for claudication.  Given his previous medical history and description of symptoms, we will have the patient undergo ABIs when he follows up with [place] in the outpatient setting.  I discussed with the patient that claudication does not necessarily mean that he needs intervention immediately but that he certainly should be followed in the outpatient setting, in order to better evaluate his level of PAD and to follow in the long-term.  Plan of care discussed with  Dr. Lucky Cowboy and he is in agreement with plan noted above.   Family Communication: Daughter at bedside  Total Time:75 minutes I spent 75 minutes in this encounter including personally reviewing extensive medical records, personally reviewing imaging studies and compared to prior scans, counseling the patient, placing orders, coordinating care and performing appropriate documentation  Thank you for allowing [place] to participate in the care of this patient.   Kris Hartmann, NP [facility] [phone] (Office Phone) [phone] (Office Fax) [phone] (Pager)  11/11/2021 2:42 PM  Staff may message me via secure chat in [place]  but this may not receive immediate response,  please page for urgent matters!  Dictation software was used to generate the above note. Typos may occur and escape review, as with typed/written notes. Any error is purely unintentional.  Please contact me directly for clarity if needed.

## 2021-11-11 NOTE — H&P (View-Only) (Signed)
[facility] Consult Note  MRN : [id number]  Todd Valentine is a 81 y.o. (1941-01-04) male who presents with chief complaint of  Chief Complaint  Patient presents with   Hypertension  .   Consulting Physician: Gevena Barre, MD Reason for consult: Renal Artery Stenosis  History of Present Illness: Todd Valentine is a 81 year old male with a past medical history of tobacco use, HFpEF, diabetes mellitus, atrial fibrillation after noting that he had elevated blood pressures in the 180s, in addition to a heart rate in the 30s.  With adjustment of blood pressure medications his blood pressure has come down and become regulated.  In the midst of work-up it was found that the patient had a greater than 50% stenosis via ultrasound.  Based on the systolic velocities in the proximal right renal artery with a mid left renal artery.  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  He also endorses having claudication symptoms after walking 3 to 400 yards but he denies any rest pain or ulceration.  Current Facility-Administered Medications  Medication Dose Route Frequency Provider Last Rate Last Admin   acetaminophen (TYLENOL) tablet 650 mg  650 mg Oral Q6H PRN Mansy, Jan A, MD       Or   acetaminophen (TYLENOL) suppository 650 mg  650 mg Rectal Q6H PRN Mansy, Jan A, MD       ALPRAZolam Duanne Moron) tablet 0.25 mg  0.25 mg Oral TID PRN Annita Brod, MD   0.25 mg at 11/10/21 2322   amLODipine (NORVASC) tablet 10 mg  10 mg Oral Daily Sueanne Margarita, MD   10 mg at 11/11/21 1023   apixaban (ELIQUIS) tablet 2.5 mg  2.5 mg Oral BID Annita Brod, MD   2.5 mg at 11/11/21 1023   dapagliflozin propanediol (FARXIGA) tablet 10 mg  10 mg Oral Daily Mansy, Jan A, MD   10 mg at 11/11/21  1019   doxazosin (CARDURA) tablet 8 mg  8 mg Oral QHS Fransico Him R, MD   8 mg at 11/10/21 2231   ezetimibe (ZETIA) tablet 10 mg  10 mg Oral Daily Mansy, Jan A, MD   10 mg at 11/11/21 1019   famotidine (PEPCID) tablet 20 mg  20 mg Oral QHS Mansy, Jan A, MD   20 mg at 11/10/21 2136   furosemide (LASIX) tablet 20 mg  20 mg Oral Daily Turner, Traci R, MD   20 mg at 11/11/21 1023   glipiZIDE (GLUCOTROL XL) 24 hr tablet 5 mg  5 mg Oral Q breakfast Mansy, Jan A, MD   5 mg at 11/11/21 1022   hydrALAZINE (APRESOLINE) injection 10 mg  10 mg Intravenous Q6H PRN Mansy, Jan A, MD   10 mg at 11/09/21 0915   hydrALAZINE (APRESOLINE) tablet 100 mg  100 mg Oral Q8H Gevena Barre K, MD   100 mg at 11/11/21 1423   isosorbide mononitrate (IMDUR) 24 hr tablet 120 mg  120 mg Oral Daily Gevena Barre K, MD   120 mg at 11/11/21 1019   linagliptin (TRADJENTA) tablet 5 mg  5 mg Oral Daily Mansy, Jan A, MD   5 mg at 11/10/21 1002   losartan (COZAAR) tablet 100 mg  100 mg Oral Daily Mansy, Jan A, MD   100 mg at  11/11/21 1020   magnesium hydroxide (MILK OF MAGNESIA) suspension 30 mL  30 mL Oral Daily PRN Mansy, Jan A, MD       magnesium oxide (MAG-OX) tablet 400 mg  400 mg Oral Daily Mansy, Jan A, MD   400 mg at 11/11/21 1018   nitroGLYCERIN (NITROSTAT) SL tablet 0.4 mg  0.4 mg Sublingual Q5 min PRN Mansy, Jan A, MD       ondansetron [facility]) tablet 4 mg  4 mg Oral Q6H PRN Mansy, Jan A, MD       Or   ondansetron [facility]) injection 4 mg  4 mg Intravenous Q6H PRN Mansy, Jan A, MD       potassium chloride SA (KLOR-CON M) CR tablet 20 mEq  20 mEq Oral Daily Mansy, Jan A, MD   20 mEq at 11/11/21 1022   rosuvastatin (CRESTOR) tablet 5 mg  5 mg Oral Daily Mansy, Jan A, MD   5 mg at 11/11/21 1022   tamsulosin (FLOMAX) capsule 0.4 mg  0.4 mg Oral Daily Mansy, Jan A, MD   0.4 mg at 11/11/21 1023   traZODone (DESYREL) tablet 50 mg  50 mg Oral QHS PRN Mansy, Jan A, MD   50 mg at 11/10/21 2231    Past Medical History:   Diagnosis Date   Acute renal failure ([name])    BPH (benign prostatic hyperplasia)    Chronic atrial fibrillation ([name])    Chronic diastolic CHF (congestive heart failure) ([place])    a. echo as above   Chronic insomnia    Coronary artery disease, non-occlusive 10/06/2013   a. cath 09/2013: pLAD 40, CTO Diag, ramus 40-->80, OM 72 mRCA 38, med rx   Depression    Diabetes mellitus with complication ([place])    Diverticulitis of colon    Dysrhythmia    atrial fibrillation   Heart murmur    systolic murmur, unspecified   History of diverticulitis    Hypercholesterolemia    Hyperkalemia    Hyperlipidemia    Hyperlipidemia    Hypertension    Hypokalemia    Hypomagnesemia    Malignant melanoma ([place])    Melanoma ([place])    Mitral regurgitation    a. echo 09/2013: EF 55-60%, mildly dilated left atrrium, mild to moderate MR/TR   Sleep apnea in adult     Past Surgical History:  Procedure Laterality Date   APPENDECTOMY     CARDIAC CATHETERIZATION  10/06/2013   COLONOSCOPY     COLONOSCOPY WITH PROPOFOL N/A 11/23/2014   Procedure: COLONOSCOPY WITH PROPOFOL;  Surgeon: Manya Silvas, MD;  Location: [facility] ENDOSCOPY;  Service: Endoscopy;  Laterality: N/A;   COLONOSCOPY WITH PROPOFOL N/A 04/11/2020   Procedure: COLONOSCOPY WITH PROPOFOL;  Surgeon: Toledo, Benay Pike, MD;  Location: [facility] ENDOSCOPY;  Service: Gastroenterology;  Laterality: N/A;  DM  ELIQUIS   MOHS SURGERY     removal tubular adenoma      Social History Social History   Tobacco Use   Smoking status: Former    Packs/day: 3.00    Years: 30.00    Total pack years: 90.00    Types: Cigarettes    Quit date: 1989    Years since quitting: 34.7   Smokeless tobacco: Never  Vaping Use   Vaping Use: Never used  Substance Use Topics   Alcohol use: Yes    Comment: "occassionally have a few drinks"   Drug use: Not Currently    Family History Family History  Family history unknown:  Yes    Allergies  Allergen Reactions    Hydrocodone-Acetaminophen Other (See Comments)   Aspirin Other (See Comments)    Contraindicated - Kidney disease Other reaction(s): Other (See Comments), Other (See Comments) Contraindicated - Kidney disease Contraindicated - Kidney disease   Ciprofloxacin Other (See Comments)    Other reaction(s): Pt states he could hardly move Other reaction(s): Other (See Comments), Other (See Comments) Pt states he could hardly move Other reaction(s): Other (See Comments) Pt states he could hardly move Other reaction(s): Pt states he could hardly move Pt states he could hardly move   Sitagliptin Other (See Comments)    Other reaction(s): Weakness  Other reaction(s): Other (See Comments), Other (See Comments) Weakness Other reaction(s): Other (See Comments) Weakness Other reaction(s): Weakness Weakness     REVIEW OF SYSTEMS (Negative unless checked)  Constitutional: '[]'$ Weight loss  '[]'$ Fever  '[]'$ Chills Cardiac: '[]'$ Chest pain   '[]'$ Chest pressure   '[]'$ Palpitations   '[]'$ Shortness of breath when laying flat   '[]'$ Shortness of breath at rest   '[]'$ Shortness of breath with exertion. Vascular:  '[]'$ Pain in legs with walking   '[]'$ Pain in legs at rest   '[]'$ Pain in legs when laying flat   '[x]'$ Claudication   '[]'$ Pain in feet when walking  '[]'$ Pain in feet at rest  '[]'$ Pain in feet when laying flat   '[]'$ History of DVT   '[]'$ Phlebitis   '[]'$ Swelling in legs   '[]'$ Varicose veins   '[]'$ Non-healing ulcers Pulmonary:   '[]'$ Uses home oxygen   '[]'$ Productive cough   '[]'$ Hemoptysis   '[]'$ Wheeze  '[]'$ COPD   '[]'$ Asthma Neurologic:  '[]'$ Dizziness  '[]'$ Blackouts   '[]'$ Seizures   '[]'$ History of stroke   '[]'$ History of TIA  '[]'$ Aphasia   '[]'$ Temporary blindness   '[]'$ Dysphagia   '[]'$ Weakness or numbness in arms   '[]'$ Weakness or numbness in legs Musculoskeletal:  '[]'$ Arthritis   '[]'$ Joint swelling   '[]'$ Joint pain   '[]'$ Low back pain Hematologic:  '[]'$ Easy bruising  '[]'$ Easy bleeding   '[]'$ Hypercoagulable state   '[]'$ Anemic  '[]'$ Hepatitis Gastrointestinal:  '[]'$ Blood in stool   '[]'$ Vomiting blood   '[]'$ Gastroesophageal reflux/heartburn   '[]'$ Difficulty swallowing. Genitourinary:  '[]'$ Chronic kidney disease   '[]'$ Difficult urination  '[]'$ Frequent urination  '[]'$ Burning with urination   '[]'$ Blood in urine Skin:  '[]'$ Rashes   '[]'$ Ulcers   '[]'$ Wounds Psychological:  '[]'$ History of anxiety   '[]'$  History of major depression.  Physical Examination  Vitals:   11/10/21 2322 11/11/21 0439 11/11/21 0926 11/11/21 1159  BP: (!) 161/65 (!) 150/71 (!) 153/89 (!) 147/103  Pulse: 100 (!) 45 (!) 124 86  Resp: '18 20 20 17  '$ Temp: 97.8 F (36.6 C) 98.4 F (36.9 C) 97.7 F (36.5 C) 98.1 F (36.7 C)  TempSrc:   Axillary Oral  SpO2: 94% 94% 97% 98%  Weight:       Body mass index is 30.96 kg/m. Gen:  WD/WN, NAD Head: [place]/AT, No temporalis wasting. Prominent temp pulse not noted. Ear/Nose/Throat: Hearing grossly intact, nares w/o erythema or drainage, oropharynx w/o Erythema/Exudate Eyes: Sclera non-icteric, conjunctiva clear Neck: Trachea midline.  No JVD.  Pulmonary:  Good air movement, respirations not labored, equal bilaterally.  Cardiac: RRR, normal S1, S2. Vascular:  Vessel Right Left  PT Not Palpable Not Palpable  DP Not Palpable Not Palpable   Gastrointestinal: soft, non-tender/non-distended. No guarding/reflex.  Musculoskeletal: M/S 5/5 throughout.  Extremities without ischemic changes.  No deformity or atrophy. No edema. Neurologic: Sensation grossly intact in extremities.  Symmetrical.  Speech is fluent. Motor exam as listed above. Psychiatric: Judgment intact, Mood &  affect appropriate for pt's clinical situation. Dermatologic: No rashes or ulcers noted.  No cellulitis or open wounds. Lymph : No Cervical, Axillary, or Inguinal lymphadenopathy.    CBC Lab Results  Component Value Date   WBC 7.9 11/08/2021   HGB 15.3 11/08/2021   HCT 47.6 11/08/2021   MCV 92.6 11/08/2021   PLT 113 (L) 11/08/2021    BMET    Component Value Date/Time   NA 140 11/11/2021 0552   NA 143 08/07/2014 0838   NA 134  (L) 10/06/2013 0728   K 3.5 11/11/2021 0552   K 3.2 (L) 10/06/2013 0728   CL 108 11/11/2021 0552   CL 99 10/06/2013 0728   CO2 24 11/11/2021 0552   CO2 29 10/06/2013 0728   GLUCOSE 136 (H) 11/11/2021 0552   GLUCOSE 161 (H) 10/06/2013 0728   BUN 29 (H) 11/11/2021 0552   BUN 20 08/07/2014 0838   BUN 30 (H) 10/06/2013 0728   CREATININE 1.65 (H) 11/11/2021 0552   CREATININE 1.56 (H) 10/06/2013 0728   CALCIUM 8.5 (L) 11/11/2021 0552   CALCIUM 8.6 10/06/2013 0728   GFRNONAA 42 (L) 11/11/2021 0552   GFRNONAA 44 (L) 10/06/2013 0728   GFRAA 49 (L) 09/29/2018 0328   GFRAA 51 (L) 10/06/2013 0728   Estimated Creatinine Clearance: 40 mL/min (A) (by C-G formula based on SCr of 1.65 mg/dL (H)).  COAG Lab Results  Component Value Date   INR 1.54 05/08/2016   INR 1.7 10/06/2013   INR 2.0 10/05/2013    Radiology MR ANGIO ABDOMEN W WO CONTRAST  Result Date: 11/11/2021 CLINICAL DATA:  81 year old male with hypertension EXAM: MRA ABDOMEN AND PELVIS WITH CONTRAST TECHNIQUE: Multiplanar, multiecho pulse sequences of the abdomen and pelvis were obtained with intravenous contrast. Angiographic images of abdomen and pelvis were obtained using MRA technique with intravenous contrast. CONTRAST:  35m GADAVIST GADOBUTROL 1 MMOL/ML IV SOLN COMPARISON:  None FINDINGS: MRA ABDOMEN/pelvis FINDINGS Vascular: Aorta: Diameter at the hiatus measures 2.4 cm. Normal course caliber and contour. Mild atherosclerotic plaque in the lower abdominal aorta without significant calcifications. No wall thickening or periaortic fluid. Celiac artery: Maintained flow signal with no significant atherosclerotic plaque or narrowing by MR imaging. Typical branch pattern with flow signal maintained in the branches. SMA: At least 50% narrowing at the origin of the SMA secondary to presumed atherosclerotic plaque. Maintain flow signal within the branch vessels. Renal arteries: Right: No significant stenosis at the origin the right renal  artery. There is at least 50% narrowing in the mid segment of the renal artery. No pre hilar branches identified. Left renal artery: Flow signal maintained at the origin. Evidence of atherosclerotic plaque at the renal artery origin, with less than 50% narrowing. No pre hilar branches identified. Right lower extremity: Normal course caliber and contour of the right iliac system. Flow signal maintained with no high-grade stenosis or occlusion. Hypogastric artery maintains flow signal at the origin though appears narrowed. Common femoral artery patent without high-grade stenosis. Proximal profunda femoris and SFA maintained flow. Left lower extremity: Normal course caliber and contour of the left iliac system. Flow signal maintained with no evidence of high-grade stenosis or occlusion. Hypogastric artery demonstrates maintained flow signal. Common femoral artery demonstrates no stenosis. Proximal profunda femoris and SFA demonstrate maintained flow signal. Venous: Unremarkable hepatic venous and portal venous system. Unremarkable IVC and iliac veins.  Unremarkable renal veins. Nonvascular: No acute finding at the lung base Hepatic biliary: Uniform signal of the liver parenchyma. Unremarkable gallbladder. Pancreas:  Uniform signal Spleen: Uniform signal, not enlarged. Kidneys/adrenal glands: Unremarkable adrenal glands No hydronephrosis with symmetric size of the kidneys. Gastrointestinal: Unremarkable small bowel and stomach. Colonic diverticula. No inflammatory changes identified by MR imaging. Mesentery/lymphatic: Unremarkable Musculoskeletal: Multilevel degenerative changes. Fat signal lesion within L3, compatible with osseous hemangioma. Pelvic structures: Unremarkable Additional: None IMPRESSION: MR angiogram demonstrates single renal arteries bilaterally, with greater than 50% stenosis in the mid right renal artery, and no high-grade stenosis on the left. 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 Aortic Atherosclerosis (ICD10-I70.0). Additional ancillary findings as above. Signed, Dulcy Fanny. Nadene Rubins, RPVI [facility] Electronically Signed   By: [name]   On: 11/11/2021 08:56   MR ANGIO PELVIS W WO CONTRAST  Result Date: 11/11/2021 CLINICAL DATA:  81 year old male with hypertension EXAM: MRA ABDOMEN AND PELVIS WITH CONTRAST TECHNIQUE: Multiplanar, multiecho pulse sequences of the abdomen and pelvis were obtained with intravenous contrast. Angiographic images of abdomen and pelvis were obtained using MRA technique with intravenous contrast. CONTRAST:  64m GADAVIST GADOBUTROL 1 MMOL/ML IV SOLN COMPARISON:  None FINDINGS: MRA ABDOMEN/pelvis FINDINGS Vascular: Aorta: Diameter at the hiatus measures 2.4 cm. Normal course caliber and contour. Mild atherosclerotic plaque in the lower abdominal aorta without significant calcifications. No wall thickening or periaortic fluid. Celiac artery: Maintained flow signal with no significant atherosclerotic plaque or narrowing by MR imaging. Typical branch pattern with flow signal maintained in the branches. SMA: At least 50% narrowing at the origin of the SMA secondary to presumed atherosclerotic plaque. Maintain flow signal within the branch vessels. Renal arteries: Right: No significant stenosis at the origin the right renal artery. There is at least 50% narrowing in the mid segment of the renal artery. No pre hilar branches identified. Left renal artery: Flow signal maintained at the origin. Evidence of atherosclerotic plaque at the renal artery origin, with less than 50% narrowing. No pre hilar branches identified. Right lower extremity: Normal course caliber and contour of the right iliac system. Flow signal maintained with no high-grade stenosis or occlusion. Hypogastric artery maintains flow signal at the origin  though appears narrowed. Common femoral artery patent without high-grade stenosis. Proximal profunda femoris and SFA maintained flow. Left lower extremity: Normal course caliber and contour of the left iliac system. Flow signal maintained with no evidence of high-grade stenosis or occlusion. Hypogastric artery demonstrates maintained flow signal. Common femoral artery demonstrates no stenosis. Proximal profunda femoris and SFA demonstrate maintained flow signal. Venous: Unremarkable hepatic venous and portal venous system. Unremarkable IVC and iliac veins.  Unremarkable renal veins. Nonvascular: No acute finding at the lung base Hepatic biliary: Uniform signal of the liver parenchyma. Unremarkable gallbladder. Pancreas: Uniform signal Spleen: Uniform signal, not enlarged. Kidneys/adrenal glands: Unremarkable adrenal glands No hydronephrosis with symmetric size of the kidneys. Gastrointestinal: Unremarkable small bowel and stomach. Colonic diverticula. No inflammatory changes identified by MR imaging. Mesentery/lymphatic: Unremarkable Musculoskeletal: Multilevel degenerative changes. Fat signal lesion within L3, compatible with osseous hemangioma. Pelvic structures: Unremarkable Additional: None IMPRESSION: MR angiogram demonstrates single renal arteries bilaterally, with greater than 50% stenosis in the mid right renal artery, and no high-grade stenosis on the left. Mesenteric arterial disease with greater than 50% narrowing at the origin of the SMA, and associated aortic atherosclerosis. Aortic Atherosclerosis (ICD10-I70.0). Additional ancillary findings as above. Signed, JDulcy Fanny WNadene Rubins RPVI [facility] Electronically Signed  By: [name]   On: 11/11/2021 08:56   US RENAL ARTERY DUPLEX COMPLETE  Result Date: 11/10/2021 CLINICAL DATA:  Hypertension EXAM: RENAL/URINARY TRACT ULTRASOUND RENAL DUPLEX DOPPLER ULTRASOUND COMPARISON:   None Available. FINDINGS: Right Kidney: Length: 9.9 cm. Echogenicity within normal limits. No mass or hydronephrosis visualized. Left Kidney: Length: 12.3 cm. Echogenicity within normal limits. No mass or hydronephrosis visualized. Bladder:  Within normal limits RENAL DUPLEX ULTRASOUND Right Renal Artery Velocities: Origin:  200 cm/sec Mid:  96 cm/sec Hilum:  173 cm/sec Interlobar:  53 cm/sec Arcuate:  32 cm/sec Left Renal Artery Velocities: Origin:  Not visualized Mid:  190 cm/sec Hilum:  230 cm/sec Interlobar:  51 cm/sec Arcuate:  36 cm/sec Aortic Velocity:  120 cm/sec Right Renal-Aortic Ratios: Origin: 1.7 Mid:  0.8 Hilum: 1.4 Interlobar: 0.4 Arcuate: 0.3 Left Renal-Aortic Ratios: Origin: Not seen Mid: 1.6 Hilum: 1.9 Interlobar: 0.4 Arcuate: 0.3 IMPRESSION: 1. Unfortunately limited diagnostic quality of exam secondary to excessive bowel gas. However, measurements of peak systolic velocity 585 centimeters/second or greater are visualized in the proximal right renal artery and the mid left renal artery. This raises the possibility of underlying renal artery stenosis. Consider further evaluation with CT or MR angiography. 2. Sonographically unremarkable appearance of the kidneys. Electronically Signed   By: Jacqulynn Cadet M.D.   On: 11/10/2021 07:08   ECHOCARDIOGRAM COMPLETE  Result Date: 11/09/2021    ECHOCARDIOGRAM REPORT   Patient Name:   Todd Valentine Date of Exam: 11/09/2021 Medical Rec #:  [id number]        Height:       68.5 in Accession #:    [id number]       Weight:       206.6 lb Date of Birth:  Jan 17, 1941        BSA:          2.083 m Patient Age:    28 years         BP:           145/61 mmHg Patient Gender: M                HR:           52 bpm. Exam Location:  [facility] Procedure: 2D Echo and Intracardiac Opacification Agent Indications:     Diastolic CHF  History:         Patient has prior history of Echocardiogram examinations. CAD,                  Arrythmias:Atrial Fibrillation; Risk  Factors:Hypertension,                  Dyslipidemia and Diabetes.  Sonographer:     L Thornton-Maynard Referring Phys:  Eugenie Norrie, A Diagnosing Phys: Fransico Him MD  Sonographer Comments: Suboptimal apical window. IMPRESSIONS  1. Left ventricular ejection fraction, by estimation, is 70 to 75%. The left ventricle has hyperdynamic function. The left ventricle has no regional wall motion abnormalities. There is mild asymmetric left ventricular hypertrophy. Left ventricular diastolic function could not be evaluated. Elevated left ventricular end-diastolic pressure.  2. Right ventricular systolic function is normal. The right ventricular size is normal. Tricuspid regurgitation signal is inadequate for assessing PA pressure.  3. Right atrial size was moderately dilated.  4. The mitral valve is degenerative. No evidence of mitral valve regurgitation. No evidence of mitral stenosis. The mean mitral valve gradient is 3.0 mmHg.  5. The aortic valve is calcified. Aortic valve regurgitation  is trivial. Moderate aortic valve stenosis. Aortic valve area, by VTI measures 1.27 cm. Aortic valve mean gradient measures 21.0 mmHg. Aortic valve Vmax measures 3.22 m/s.  6. Aortic dilatation noted. There is borderline dilatation of the aortic root, measuring 37 mm.  7. The inferior vena cava is dilated in size with >50% respiratory variability, suggesting right atrial pressure of 8 mmHg.  8. Evidence of atrial level shunting detected by color flow Doppler. There is a small patent foramen ovale with bidirectional shunting across atrial septum. FINDINGS  Left Ventricle: Left ventricular ejection fraction, by estimation, is 70 to 75%. The left ventricle has hyperdynamic function. The left ventricle has no regional wall motion abnormalities. Definity contrast agent was given IV to delineate the left ventricular endocardial borders. The left ventricular internal cavity size was normal in size. There is mild asymmetric left ventricular  hypertrophy. Left ventricular diastolic function could not be evaluated. Elevated left ventricular end-diastolic pressure. Right Ventricle: The right ventricular size is normal. No increase in right ventricular wall thickness. Right ventricular systolic function is normal. Tricuspid regurgitation signal is inadequate for assessing PA pressure. Left Atrium: Left atrial size was normal in size. Right Atrium: Right atrial size was moderately dilated. Pericardium: There is no evidence of pericardial effusion. Mitral Valve: The mitral valve is degenerative in appearance. There is mild thickening of the mitral valve leaflet(s). There is moderate calcification of the mitral valve leaflet(s). Mild to moderate mitral annular calcification. No evidence of mitral valve regurgitation. No evidence of mitral valve stenosis. MV peak gradient, 13.4 mmHg. The mean mitral valve gradient is 3.0 mmHg. Tricuspid Valve: The tricuspid valve is normal in structure. Tricuspid valve regurgitation is not demonstrated. No evidence of tricuspid stenosis. Aortic Valve: The aortic valve is calcified. Aortic valve regurgitation is trivial. Moderate aortic stenosis is present. Aortic valve mean gradient measures 21.0 mmHg. Aortic valve peak gradient measures 41.3 mmHg. Aortic valve area, by VTI measures 1.27  cm. Pulmonic Valve: The pulmonic valve was normal in structure. Pulmonic valve regurgitation is not visualized. No evidence of pulmonic stenosis. Aorta: Aortic dilatation noted. There is borderline dilatation of the aortic root, measuring 37 mm. Venous: The inferior vena cava is dilated in size with greater than 50% respiratory variability, suggesting right atrial pressure of 8 mmHg. IAS/Shunts: The interatrial septum appears to be lipomatous. Evidence of atrial level shunting detected by color flow Doppler. A small patent foramen ovale is detected with bidirectional shunting across atrial septum.  LEFT VENTRICLE PLAX 2D LVIDd:         4.90  cm     Diastology LVIDs:         2.30 cm     LV e' medial:    4.90 cm/s LV PW:         1.00 cm     LV E/e' medial:  31.4 LV IVS:        1.40 cm     LV e' lateral:   8.16 cm/s LVOT diam:     2.20 cm     LV E/e' lateral: 18.9 LV SV:         81 LV SV Index:   39 LVOT Area:     3.80 cm  LV Volumes (MOD) LV vol d, MOD A2C: 40.5 ml LV vol d, MOD A4C: 44.0 ml LV vol s, MOD A2C: 3.4 ml LV vol s, MOD A4C: 6.0 ml LV SV MOD A2C:     37.1 ml LV SV MOD A4C:  44.0 ml LV SV MOD BP:      38.7 ml RIGHT VENTRICLE RV S prime:     10.90 cm/s TAPSE (M-mode): 2.5 cm LEFT ATRIUM             Index        RIGHT ATRIUM           Index LA diam:        4.30 cm 2.06 cm/m   RA Area:     26.80 cm LA Vol (A2C):   57.1 ml 27.41 ml/m  RA Volume:   83.60 ml  40.13 ml/m LA Vol (A4C):   59.5 ml 28.56 ml/m LA Biplane Vol: 62.8 ml 30.14 ml/m  AORTIC VALVE                     PULMONIC VALVE AV Area (Vmax):    1.38 cm      PV Vmax:       1.77 m/s AV Area (Vmean):   1.32 cm      PV Vmean:      129.000 cm/s AV Area (VTI):     1.27 cm      PV VTI:        0.350 m AV Vmax:           321.50 cm/s   PV Peak grad:  12.5 mmHg AV Vmean:          208.500 cm/s  PV Mean grad:  7.0 mmHg AV VTI:            0.633 m AV Peak Grad:      41.3 mmHg AV Mean Grad:      21.0 mmHg LVOT Vmax:         117.00 cm/s LVOT Vmean:        72.300 cm/s LVOT VTI:          0.212 m LVOT/AV VTI ratio: 0.33  AORTA Ao Root diam: 3.70 cm MITRAL VALVE MV Area (PHT): 3.27 cm     SHUNTS MV Area VTI:   1.48 cm     Systemic VTI:  0.21 m MV Peak grad:  13.4 mmHg    Systemic Diam: 2.20 cm MV Mean grad:  3.0 mmHg MV Vmax:       1.83 m/s MV Vmean:      72.4 cm/s MV Decel Time: 232 msec MV E velocity: 154.00 cm/s Fransico Him MD Electronically signed by Fransico Him MD Signature Date/Time: 11/09/2021/10:42:15 PM    Final    DG Chest Port 1 View  Result Date: 11/07/2021 CLINICAL DATA:  Chest pain EXAM: PORTABLE CHEST 1 VIEW COMPARISON:  Radiograph 01/17/2020 FINDINGS: Unchanged mild  cardiomegaly. There are interstitial opacity. No large pleural effusion. No pneumothorax. No acute osseous abnormality. Bilateral shoulder degenerative changes. Thoracic spondylosis. IMPRESSION: Mild interstitial edema.  Unchanged mild cardiomegaly. Electronically Signed   By: Maurine Simmering M.D.   On: 11/07/2021 15:17      Assessment/Plan 1.  Renal artery stenosis  The patient has had hypertensive urgency with systolic blood pressure in the 180s to 200s.  This has been improved with treatment while inpatient but given the patient's greater than 50% renal artery stenosis.  The ultrasound indicates elevated velocities in the proximal right renal artery as well as the mid left renal artery.  We will have the patient undergo angiogram for evaluation possible treatment of stenosis.  We have discussed the procedures as well as the risk, benefits and alternatives and  the patient is agreeable to proceed.  We will make the patient n.p.o. on Tuesday with planned intervention on Wednesday.  2.  Superior mesenteric artery stenosis The patient had incidental finding of a greater than 50% stenosis of the SMA however he currently does not have any symptoms including food phobia, unintended weight loss, nausea vomiting or abdominal pain.  Based on this we will continue to follow this in the outpatient setting.  3.  Claudication The patient has significant atherosclerotic risk factors.  Following discussion with the patient he does have leg pain that is suspicious for claudication.  Given his previous medical history and description of symptoms, we will have the patient undergo ABIs when he follows up with [place] in the outpatient setting.  I discussed with the patient that claudication does not necessarily mean that he needs intervention immediately but that he certainly should be followed in the outpatient setting, in order to better evaluate his level of PAD and to follow in the long-term.  Plan of care discussed with  Dr. Lucky Cowboy and he is in agreement with plan noted above.   Family Communication: Daughter at bedside  Total Time:75 minutes I spent 75 minutes in this encounter including personally reviewing extensive medical records, personally reviewing imaging studies and compared to prior scans, counseling the patient, placing orders, coordinating care and performing appropriate documentation  Thank you for allowing [place] to participate in the care of this patient.   Kris Hartmann, NP [facility] [phone] (Office Phone) [phone] (Office Fax) [phone] (Pager)  11/11/2021 2:42 PM  Staff may message me via secure chat in [place]  but this may not receive immediate response,  please page for urgent matters!  Dictation software was used to generate the above note. Typos may occur and escape review, as with typed/written notes. Any error is purely unintentional.  Please contact me directly for clarity if needed.

## 2021-11-12 DIAGNOSIS — I701 Atherosclerosis of renal artery: Secondary | ICD-10-CM | POA: Diagnosis not present

## 2021-11-12 DIAGNOSIS — I5033 Acute on chronic diastolic (congestive) heart failure: Secondary | ICD-10-CM | POA: Diagnosis not present

## 2021-11-12 DIAGNOSIS — I160 Hypertensive urgency: Secondary | ICD-10-CM | POA: Diagnosis not present

## 2021-11-12 DIAGNOSIS — E669 Obesity, unspecified: Secondary | ICD-10-CM | POA: Diagnosis not present

## 2021-11-12 LAB — BASIC METABOLIC PANEL
Anion gap: 9 (ref 5–15)
BUN: 23 mg/dL (ref 8–23)
CO2: 24 mmol/L (ref 22–32)
Calcium: 8.4 mg/dL — ABNORMAL LOW (ref 8.9–10.3)
Chloride: 109 mmol/L (ref 98–111)
Creatinine, Ser: 1.6 mg/dL — ABNORMAL HIGH (ref 0.61–1.24)
GFR, Estimated: 43 mL/min — ABNORMAL LOW (ref >60)
Glucose, Bld: 133 mg/dL — ABNORMAL HIGH (ref 70–99)
Potassium: 3.7 mmol/L (ref 3.5–5.1)
Sodium: 142 mmol/L (ref 135–145)

## 2021-11-12 MED ORDER — ISOSORBIDE MONONITRATE ER 60 MG PO TB24
60.0000 mg | ORAL_TABLET | Freq: Every day | ORAL | Status: DC
  Administered 2021-11-12: 60 mg via ORAL
  Filled 2021-11-12: qty 1

## 2021-11-12 MED ORDER — CARVEDILOL 12.5 MG PO TABS
12.5000 mg | ORAL_TABLET | Freq: Two times a day (BID) | ORAL | Status: DC
  Administered 2021-11-12 (×2): 12.5 mg via ORAL
  Filled 2021-11-12 (×2): qty 1

## 2021-11-12 MED ORDER — ORAL CARE MOUTH RINSE: 15.0000 mL | OROMUCOSAL | Status: DC | PRN

## 2021-11-12 NOTE — Progress Notes (Signed)
[facility] Progress Note  Patient: Todd Valentine    XHB:[id number]  DOA: 11/07/2021    Date of Service: the patient was seen and examined on 11/12/2021  Brief hospital course: Patient is a 81 year old male with history of longstanding tobacco use, alcohol use, obesity and chronic diastolic CHF as well as diabetes mellitus, CAD atrial fibrillation who presented to the emergency room on 9/14 for increasing shortness of breath and episodes of bradycardia with heart rate in the 30s.  The emergency room, patient found to have hyper tensive urgency with systolic blood pressure in the 180s, symptomatic bradycardia with heart rate in the 30s and acute on chronic diastolic heart failure.  Patient put on IV hydralazine and Lasix, home p.o. medications on hold and cardiology consulted.  Echocardiogram noting ejection fraction of 50% and difficulty looking at diastolic function given atrial fibrillation.  Initially blood pressure difficult to control, but with additional blood pressure medications, has started to come down. Work-up is revealed right renal artery stenosis and vascular surgery consulted.  Assessment and Plan: Assessment and Plan: * Hypertensive urgency Blood pressure was markedly elevated with systolic ranging from the 180s to 200s.  Due to bradycardia, held Cardizem, clonidine and Coreg initially and continued Cozaar, adding Cardura, Norvasc and Imdur.  Blood pressure now better controlled.  Underlying etiology looks to be renal artery stenosis and patient being seen by vascular surgery for possible stent placement 9/20.  Symptomatic bradycardia-resolved as of 11/11/2021 Cardiology following.  Cardizem, beta-blocker and clonidine held initially started to have episodes of heart rate above 100 so have restarted beta-blocker at lower dose.    Renal artery stenosis ([name]) Confirmed with MR angiogram.  Being seen by vascular surgery with plans for possible stent placement 9/20.  Acute on  chronic diastolic CHF (congestive heart failure) ([name]) - We will gently diurese with IV Lasix, currently has diuresed over 6 L and is more than - negative for L deficient.  Creatinine trending downward.  Lasix changed to p.o.  We will continue his Farxiga and Cozaar.  Atrial flutter with controlled response ([place]) Continued on Eliquis.  Chronotropic agents initially held due to bradycardia as above.  Metoprolol resumed at lower dose.  Anxiety Patient had been complaining of feeling very anxious on 9/16, felt to be secondary to rising heart rate as his chronotropic agents were clearing his system.  Given as needed Xanax which she has responded to very well and says he feels much much better.   Mesenteric artery stenosis ([name]) Incidental finding during renal artery stenosis work-up.  Seen by vascular surgery and since patient is asymptomatic at this point, they will follow as outpatient.  Type 2 diabetes mellitus with chronic kidney disease, without long-term current use of insulin ([name]) Stable state- This is associated with stage 3B chronic kidney disease. - The patient will be placed on supplement coverage with NovoLog.   Hyperlipidemia Continue statin  Obesity (BMI 30-39.9) Patient meets criteria BMI greater than 30       Body mass index is 30.96 kg/m.        Consultants: Cardiology Vascular surgery  Procedures: Echocardiogram noting preserved ejection fraction, indeterminate diastolic function with moderate aortic valve stenosis. ABIs pending Planned right renal artery stent 9/20  Antimicrobials: None  Code Status: Full code   Subjective: Patient continues to feel better.  Objective: Blood pressures improving. Vitals:   11/12/21 0803 11/12/21 1251  BP: (!) 135/56 (!) 138/54  Pulse: (!) 105 90  Resp: 18 16  Temp:  98.6 F (37 C) 97.8 F (36.6 C)  SpO2: 97% 99%    Intake/Output Summary (Last 24 hours) at 11/12/2021 1434 Last data filed at 11/12/2021  1052 Gross per 24 hour  Intake 600 ml  Output 1550 ml  Net -950 ml    Filed Weights   11/08/21 0941  Weight: 93.7 kg   Body mass index is 30.96 kg/m.  Exam:  General: Alert and oriented x3, anxious HEENT: Normocephalic, atraumatic, mucous membranes are moist Cardiovascular: Irregular rhythm, borderline tachycardia Respiratory: Clear to auscultation bilaterally Abdomen: Soft, nontender, nondistended, positive bowel sounds Musculoskeletal: No clubbing or cyanosis, trace pitting edema Skin: No skin breaks, tears or lesions Psychiatry: Appropriate, no evidence of psychoses Neurology: No focal deficits  Data Reviewed: Creatinine continues to improve  Disposition:  Status is: Inpatient Remains inpatient appropriate because:  -Further diuresis -Treatment of renal artery stenosis  Anticipated discharge date: 9/21  Family Communication: Left message for daughter DVT Prophylaxis: apixaban (ELIQUIS) tablet 2.5 mg Start: 11/08/21 2200 apixaban (ELIQUIS) tablet 2.5 mg    Author: Annita Brod ,MD 11/12/2021 2:34 PM  To reach On-call, see care teams to locate the attending and reach out via [email or web address]. Between 7PM-7AM, please contact night-coverage If you still have difficulty reaching the attending provider, please page the [facility] (Director on Call) for [facility] on amion for assistance.

## 2021-11-12 NOTE — Assessment & Plan Note (Signed)
Confirmed with MR angiogram.  Being seen by vascular surgery with plans for possible stent placement 9/20.

## 2021-11-12 NOTE — Plan of Care (Signed)

## 2021-11-12 NOTE — Progress Notes (Signed)
Visited patient for heart failure education and information about upcoming new patient appointment with the heart failure clinic. Patient given verbal and written information, including the 'Living Better with Heart Failure' booklet. Discussed conceps with patient such as daily weighing, maintaining a low sodium diet and signs and symptoms that should prompt a call to their doctor/heart failure clinic. Patient has a scale at home. Patient denied any issues with transportation to get to appointments. Patient verbalized understanding of education but stated that he would like to look over the information himself and discuss with a family member and he will call the clinic to let us know if he wants to keep the currently scheduled appointment on 11/26/21. Georg Ruddle, RN

## 2021-11-13 ENCOUNTER — Encounter
Admission: EM | Disposition: A | Discharge status: Home / Self Care | Payer: Self-pay | Source: Home / Self Care | Attending: Internal Medicine

## 2021-11-13 DIAGNOSIS — I700 Atherosclerosis of aorta: Secondary | ICD-10-CM

## 2021-11-13 DIAGNOSIS — I160 Hypertensive urgency: Secondary | ICD-10-CM | POA: Diagnosis not present

## 2021-11-13 DIAGNOSIS — I150 Renovascular hypertension: Secondary | ICD-10-CM

## 2021-11-13 DIAGNOSIS — I708 Atherosclerosis of other arteries: Secondary | ICD-10-CM

## 2021-11-13 HISTORY — PX: RENAL ANGIOGRAPHY: CATH118260

## 2021-11-13 LAB — BASIC METABOLIC PANEL
Anion gap: 5 (ref 5–15)
BUN: 20 mg/dL (ref 8–23)
CO2: 24 mmol/L (ref 22–32)
Calcium: 8.6 mg/dL — ABNORMAL LOW (ref 8.9–10.3)
Chloride: 111 mmol/L (ref 98–111)
Creatinine, Ser: 1.43 mg/dL — ABNORMAL HIGH (ref 0.61–1.24)
GFR, Estimated: 50 mL/min — ABNORMAL LOW (ref >60)
Glucose, Bld: 133 mg/dL — ABNORMAL HIGH (ref 70–99)
Potassium: 3.9 mmol/L (ref 3.5–5.1)
Sodium: 140 mmol/L (ref 135–145)

## 2021-11-13 LAB — GLUCOSE, CAPILLARY: Glucose-Capillary: 137 mg/dL — ABNORMAL HIGH (ref 70–99)

## 2021-11-13 SURGERY — RENAL ANGIOGRAPHY
Anesthesia: Moderate Sedation | Laterality: Right

## 2021-11-13 MED ORDER — HEPARIN SODIUM (PORCINE) 1000 UNIT/ML IJ SOLN
INTRAMUSCULAR | Status: DC | PRN
  Administered 2021-11-13: 4000 [IU] via INTRAVENOUS

## 2021-11-13 MED ORDER — CEFAZOLIN SODIUM-DEXTROSE 2-4 GM/100ML-% IV SOLN
2.0000 g | INTRAVENOUS | Status: DC
  Filled 2021-11-13: qty 100

## 2021-11-13 MED ORDER — HEPARIN SODIUM (PORCINE) 1000 UNIT/ML IJ SOLN
INTRAMUSCULAR | Status: AC
  Filled 2021-11-13: qty 10

## 2021-11-13 MED ORDER — AMLODIPINE BESYLATE 10 MG PO TABS: 10.0000 mg | ORAL_TABLET | Freq: Every day | ORAL | 0 refills | Status: DC

## 2021-11-13 MED ORDER — MIDAZOLAM HCL 2 MG/2ML IJ SOLN
INTRAMUSCULAR | Status: DC | PRN
  Administered 2021-11-13 (×2): 1 mg via INTRAVENOUS

## 2021-11-13 MED ORDER — FENTANYL CITRATE (PF) 100 MCG/2ML IJ SOLN
INTRAMUSCULAR | Status: AC
  Filled 2021-11-13: qty 2

## 2021-11-13 MED ORDER — FAMOTIDINE 20 MG PO TABS: 40.0000 mg | ORAL_TABLET | Freq: Once | ORAL | Status: DC | PRN

## 2021-11-13 MED ORDER — DIPHENHYDRAMINE HCL 50 MG/ML IJ SOLN: 50.0000 mg | Freq: Once | INTRAMUSCULAR | Status: DC | PRN

## 2021-11-13 MED ORDER — MANNITOL 25 % IV SOLN
INTRAVENOUS | Status: AC
  Filled 2021-11-13: qty 50

## 2021-11-13 MED ORDER — ISOSORBIDE MONONITRATE ER 60 MG PO TB24: 60.0000 mg | ORAL_TABLET | Freq: Every day | ORAL | 0 refills | Status: DC

## 2021-11-13 MED ORDER — MIDAZOLAM HCL 2 MG/2ML IJ SOLN
INTRAMUSCULAR | Status: AC
  Filled 2021-11-13: qty 2

## 2021-11-13 MED ORDER — METHYLPREDNISOLONE SODIUM SUCC 125 MG IJ SOLR: 125.0000 mg | Freq: Once | INTRAMUSCULAR | Status: DC | PRN

## 2021-11-13 MED ORDER — MIDAZOLAM HCL 2 MG/ML PO SYRP: 8.0000 mg | ORAL_SOLUTION | Freq: Once | ORAL | Status: DC | PRN

## 2021-11-13 MED ORDER — SODIUM CHLORIDE 0.9 % IV SOLN: INTRAVENOUS | Status: DC

## 2021-11-13 MED ORDER — ONDANSETRON HCL 4 MG/2ML IJ SOLN: 4.0000 mg | Freq: Four times a day (QID) | INTRAMUSCULAR | Status: DC | PRN

## 2021-11-13 MED ORDER — CEFAZOLIN SODIUM-DEXTROSE 2-4 GM/100ML-% IV SOLN
INTRAVENOUS | Status: AC
  Administered 2021-11-13: 2 g
  Filled 2021-11-13: qty 100

## 2021-11-13 MED ORDER — HYDRALAZINE HCL 100 MG PO TABS: 100.0000 mg | ORAL_TABLET | Freq: Three times a day (TID) | ORAL | 0 refills | Status: DC

## 2021-11-13 MED ORDER — FENTANYL CITRATE (PF) 100 MCG/2ML IJ SOLN
INTRAMUSCULAR | Status: DC | PRN
  Administered 2021-11-13 (×2): 50 ug via INTRAVENOUS

## 2021-11-13 MED ORDER — DOXAZOSIN MESYLATE 8 MG PO TABS: 8.0000 mg | ORAL_TABLET | Freq: Every day | ORAL | 0 refills | Status: DC

## 2021-11-13 SURGICAL SUPPLY — 12 items
CATH ANGIO 5F PIGTAIL 65CM (CATHETERS) IMPLANT
CATH BEACON 5 .035 65 C2 TIP (CATHETERS) IMPLANT
DEVICE STARCLOSE SE CLOSURE (Vascular Products) IMPLANT
GLIDEWIRE STIFF .35X180X3 HYDR (WIRE) IMPLANT
GOWN SRG XL LVL 3 NONREINFORCE (GOWNS) IMPLANT
GOWN STRL NON-REIN TWL XL LVL3 (GOWNS) ×1
PACK ANGIOGRAPHY (CUSTOM PROCEDURE TRAY) ×1 IMPLANT
SHEATH BRITE TIP 5FRX11 (SHEATH) IMPLANT
SHEATH PROBE COVER 6X72 (BAG) IMPLANT
SYR MEDRAD MARK 7 150ML (SYRINGE) IMPLANT
TUBING CONTRAST HIGH PRESS 72 (TUBING) IMPLANT
WIRE GUIDERIGHT .035X150 (WIRE) IMPLANT

## 2021-11-13 NOTE — Discharge Summary (Signed)
Physician Discharge Summary   Patient: Todd Valentine MRN: [id number]  DOB: Jan 09, 1941   Admit:     Date of Admission: 11/07/2021 Admitted from: home   Discharge: Date of discharge: 11/13/21 Disposition: Home Condition at discharge: good  CODE STATUS: FULL CODE     Discharge Physician: Emeterio Reeve, DO [facility]     PCP: Shelda Jakes, MD  Recommendations for Outpatient Follow-up:  Follow up with PCP Shelda Jakes, MD as directed See cardiology Dr Rockey Situ in 1-2 weeks or kep current appointment  Follow w/ Dr Lucky Cowboy as directed Please obtain labs/tests: BMP, CBC in 1-2 weeks Please follow up on the following pending results: none PCP AND OTHER OUTPATIENT PROVIDERS: SEE BELOW FOR SPECIFIC DISCHARGE INSTRUCTIONS PRINTED FOR PATIENT IN ADDITION TO GENERIC AVS PATIENT INFO    Discharge Instructions     Diet - low sodium heart healthy   Complete by: As directed    Increase activity slowly   Complete by: As directed          Discharge Diagnoses: Principal Problem:   Hypertensive urgency Active Problems:   Acute on chronic diastolic CHF (congestive heart failure) ([place])   Renal artery stenosis ([name])   Atrial flutter with controlled response ([name])   Anxiety   Mesenteric artery stenosis ([place])   Type 2 diabetes mellitus with chronic kidney disease, without long-term current use of insulin ([name])   Hyperlipidemia   Obesity (BMI 30-39.9)       Hospital Course: Patient is a 81 year old male with history of longstanding tobacco use, alcohol use, obesity and chronic diastolic CHF as well as diabetes mellitus, CAD atrial fibrillation who presented to the emergency room on 9/14 for increasing shortness of breath and episodes of bradycardia with heart rate in the 30s.  The emergency room, patient found to have hyper tensive urgency with systolic blood pressure in the 180s, symptomatic bradycardia with heart rate in the 30s and acute on chronic  diastolic heart failure.  Patient put on IV hydralazine and Lasix, home p.o. medications on hold and cardiology consulted.  Echocardiogram noting ejection fraction of 50% and difficulty looking at diastolic function given atrial fibrillation.  Initially blood pressure difficult to control, but with additional blood pressure medications, has started to come down. Work-up is revealed right renal artery stenosis and vascular surgery consulted, planning angiogram 09/20 - no significant stenosis, no stents placed. BP has been stable and pt feeling well, no concerns, no CP/SOB. Medications reviewed w/ patient and daughter and discharged home         Discharge Instructions  Allergies as of 11/13/2021       Reactions   Hydrocodone-acetaminophen Other (See Comments)   Aspirin Other (See Comments)   Contraindicated - Kidney disease Other reaction(s): Other (See Comments), Other (See Comments) Contraindicated - Kidney disease Contraindicated - Kidney disease   Ciprofloxacin Other (See Comments)   Other reaction(s): Pt states he could hardly move Other reaction(s): Other (See Comments), Other (See Comments) Pt states he could hardly move Other reaction(s): Other (See Comments) Pt states he could hardly move Other reaction(s): Pt states he could hardly move Pt states he could hardly move   Sitagliptin Other (See Comments)   Other reaction(s): Weakness Other reaction(s): Other (See Comments), Other (See Comments) Weakness Other reaction(s): Other (See Comments) Weakness Other reaction(s): Weakness Weakness        Medication List     STOP taking these medications    cloNIDine 0.1 MG tablet Commonly known  as: CATAPRES   diltiazem 120 MG 24 hr capsule Commonly known as: CARDIZEM CD       TAKE these medications    acetaminophen 500 MG tablet Commonly known as: TYLENOL Take 1,000 mg by mouth every 6 (six) hours as needed for mild pain, fever or headache.   amLODipine 10 MG  tablet Commonly known as: NORVASC Take 1 tablet (10 mg total) by mouth daily.   apixaban 5 MG Tabs tablet Commonly known as: Eliquis Take 1 tablet (5 mg total) by mouth 2 (two) times daily.   carvedilol 25 MG tablet Commonly known as: COREG Take 12.5 mg by mouth 2 (two) times daily.   doxazosin 8 MG tablet Commonly known as: CARDURA Take 1 tablet (8 mg total) by mouth at bedtime.   ezetimibe 10 MG tablet Commonly known as: ZETIA Take 1 tablet by mouth once daily   famotidine 20 MG tablet Commonly known as: PEPCID Take 1 tablet (20 mg total) by mouth at bedtime.   Farxiga 10 MG Tabs tablet Generic drug: dapagliflozin propanediol Take 1 tablet (10 mg total) by mouth daily.   furosemide 20 MG tablet Commonly known as: LASIX Take 1 tablet (20 mg total) by mouth daily. Except on Saturdays and Sundays   glipiZIDE 5 MG 24 hr tablet Commonly known as: GLUCOTROL XL Take 5 mg by mouth daily with breakfast.   hydrALAZINE 100 MG tablet Commonly known as: APRESOLINE Take 1 tablet (100 mg total) by mouth every 8 (eight) hours.   isosorbide mononitrate 60 MG 24 hr tablet Commonly known as: IMDUR Take 1 tablet (60 mg total) by mouth daily.   linagliptin 5 MG Tabs tablet Commonly known as: TRADJENTA Take 5 mg by mouth daily.   losartan 100 MG tablet Commonly known as: COZAAR Take 1 tablet (100 mg total) by mouth daily.   magnesium oxide 400 (241.3 Mg) MG tablet Commonly known as: MAG-OX Take 1 tablet by mouth daily.   nitroGLYCERIN 0.4 MG SL tablet Commonly known as: NITROSTAT Place 1 tablet (0.4 mg total) under the tongue every 5 (five) minutes as needed for chest pain.   Potassium Chloride ER 20 MEQ Tbcr Take 20 mEq by mouth daily.   rosuvastatin 5 MG tablet Commonly known as: CRESTOR Take 1 tablet by mouth once daily   tamsulosin 0.4 MG Caps capsule Commonly known as: FLOMAX Take 0.4 mg by mouth daily.   traZODone 50 MG tablet Commonly known as: DESYREL Take  50 mg by mouth at bedtime as needed for sleep.         Follow-up Information     Gollan, Kathlene November, MD. Call today.   Specialty: Cardiology Why: can keep appointment 12/03/21 or may be able to move it up to the next 1-2 weeks to monitor heart and blood pressure Contact information: [address] [place] [address] [phone]         Algernon Huxley, MD. Call.   Specialties: Vascular Surgery, Radiology, Interventional Cardiology Why: for follow up as directed Contact information: [place] [address] [phone]                 Allergies  Allergen Reactions   Hydrocodone-Acetaminophen Other (See Comments)   Aspirin Other (See Comments)    Contraindicated - Kidney disease Other reaction(s): Other (See Comments), Other (See Comments) Contraindicated - Kidney disease Contraindicated - Kidney disease   Ciprofloxacin Other (See Comments)    Other reaction(s): Pt states he could  hardly move Other reaction(s): Other (See Comments), Other (See Comments) Pt states he could hardly move Other reaction(s): Other (See Comments) Pt states he could hardly move Other reaction(s): Pt states he could hardly move Pt states he could hardly move   Sitagliptin Other (See Comments)    Other reaction(s): Weakness  Other reaction(s): Other (See Comments), Other (See Comments) Weakness Other reaction(s): Other (See Comments) Weakness Other reaction(s): Weakness Weakness     Subjective: pt feels well, no concerns.    Discharge Exam: BP 139/78 (BP Location: Left Arm)   Pulse 81   Temp (!) 97.5 F (36.4 C)   Resp 18   Wt 93.7 kg   SpO2 96%   BMI 30.96 kg/m  General: Pt is alert, awake, not in acute distress Cardiovascular: irreg/irreg, nnormal rate, S1/S2 +murmur, no rubs, no gallops Respiratory: CTA bilaterally, no wheezing, no rhonchi Abdominal: Soft, NT, ND, bowel sounds + Extremities: no edema, no cyanosis     The results  of significant diagnostics from this hospitalization (including imaging, microbiology, ancillary and laboratory) are listed below for reference.     Microbiology: No results found for this or any previous visit (from the past 240 hour(s)).   Labs: BNP (last 3 results) No results for input(s): "BNP" in the last 8760 hours. Basic Metabolic Panel: Recent Labs  Lab 11/08/21 0627 11/09/21 0700 11/10/21 0527 11/11/21 0552 11/12/21 0550 11/13/21 0538  NA 142 142 142 140 142 140  K 3.6 3.6 3.8 3.5 3.7 3.9  CL 111 108 108 108 109 111  CO2 20* '26 26 24 24 24  '$ GLUCOSE 150* 154* 139* 136* 133* 133*  BUN 27* 29* 28* 29* 23 20  CREATININE 1.63* 1.85* 1.75* 1.65* 1.60* 1.43*  CALCIUM 9.2 9.0 8.8* 8.5* 8.4* 8.6*  MG 2.1  --   --   --   --   --    Liver Function Tests: Recent Labs  Lab 11/07/21 1501  AST 23  ALT 25  ALKPHOS 101  BILITOT 1.3*  PROT 7.5  ALBUMIN 4.0   No results for input(s): "LIPASE", "AMYLASE" in the last 168 hours. No results for input(s): "AMMONIA" in the last 168 hours. CBC: Recent Labs  Lab 11/07/21 1501 11/08/21 0627  WBC 6.6 7.9  NEUTROABS 4.6  --   HGB 15.3 15.3  HCT 46.6 47.6  MCV 90.3 92.6  PLT 143* 113*   Cardiac Enzymes: No results for input(s): "CKTOTAL", "CKMB", "CKMBINDEX", "TROPONINI" in the last 168 hours. BNP: Invalid input(s): "POCBNP" CBG: Recent Labs  Lab 11/07/21 2110 11/09/21 2203 11/13/21 0932  GLUCAP 161* 160* 137*   D-Dimer No results for input(s): "DDIMER" in the last 72 hours. Hgb A1c No results for input(s): "HGBA1C" in the last 72 hours. Lipid Profile No results for input(s): "CHOL", "HDL", "LDLCALC", "TRIG", "CHOLHDL", "LDLDIRECT" in the last 72 hours. Thyroid function studies No results for input(s): "TSH", "T4TOTAL", "T3FREE", "THYROIDAB" in the last 72 hours.  Invalid input(s): "FREET3" Anemia work up No results for input(s): "VITAMINB12", "FOLATE", "FERRITIN", "TIBC", "IRON", "RETICCTPCT" in the last 72  hours. Urinalysis No results found for: "COLORURINE", "APPEARANCEUR", "LABSPEC", "PHURINE", "GLUCOSEU", "HGBUR", "BILIRUBINUR", "KETONESUR", "PROTEINUR", "UROBILINOGEN", "NITRITE", "LEUKOCYTESUR" Sepsis Labs Recent Labs  Lab 11/07/21 1501 11/08/21 0627  WBC 6.6 7.9   Microbiology No results found for this or any previous visit (from the past 240 hour(s)). Imaging MR ANGIO ABDOMEN W WO CONTRAST  Result Date: 11/11/2021 CLINICAL DATA:  81 year old male with hypertension EXAM: MRA ABDOMEN AND PELVIS  WITH CONTRAST TECHNIQUE: Multiplanar, multiecho pulse sequences of the abdomen and pelvis were obtained with intravenous contrast. Angiographic images of abdomen and pelvis were obtained using MRA technique with intravenous contrast. CONTRAST:  49m GADAVIST GADOBUTROL 1 MMOL/ML IV SOLN COMPARISON:  None FINDINGS: MRA ABDOMEN/pelvis FINDINGS Vascular: Aorta: Diameter at the hiatus measures 2.4 cm. Normal course caliber and contour. Mild atherosclerotic plaque in the lower abdominal aorta without significant calcifications. No wall thickening or periaortic fluid. Celiac artery: Maintained flow signal with no significant atherosclerotic plaque or narrowing by MR imaging. Typical branch pattern with flow signal maintained in the branches. SMA: At least 50% narrowing at the origin of the SMA secondary to presumed atherosclerotic plaque. Maintain flow signal within the branch vessels. Renal arteries: Right: No significant stenosis at the origin the right renal artery. There is at least 50% narrowing in the mid segment of the renal artery. No pre hilar branches identified. Left renal artery: Flow signal maintained at the origin. Evidence of atherosclerotic plaque at the renal artery origin, with less than 50% narrowing. No pre hilar branches identified. Right lower extremity: Normal course caliber and contour of the right iliac system. Flow signal maintained with no high-grade stenosis or occlusion. Hypogastric  artery maintains flow signal at the origin though appears narrowed. Common femoral artery patent without high-grade stenosis. Proximal profunda femoris and SFA maintained flow. Left lower extremity: Normal course caliber and contour of the left iliac system. Flow signal maintained with no evidence of high-grade stenosis or occlusion. Hypogastric artery demonstrates maintained flow signal. Common femoral artery demonstrates no stenosis. Proximal profunda femoris and SFA demonstrate maintained flow signal. Venous: Unremarkable hepatic venous and portal venous system. Unremarkable IVC and iliac veins.  Unremarkable renal veins. Nonvascular: No acute finding at the lung base Hepatic biliary: Uniform signal of the liver parenchyma. Unremarkable gallbladder. Pancreas: Uniform signal Spleen: Uniform signal, not enlarged. Kidneys/adrenal glands: Unremarkable adrenal glands No hydronephrosis with symmetric size of the kidneys. Gastrointestinal: Unremarkable small bowel and stomach. Colonic diverticula. No inflammatory changes identified by MR imaging. Mesentery/lymphatic: Unremarkable Musculoskeletal: Multilevel degenerative changes. Fat signal lesion within L3, compatible with osseous hemangioma. Pelvic structures: Unremarkable Additional: None IMPRESSION: MR angiogram demonstrates single renal arteries bilaterally, with greater than 50% stenosis in the mid right renal artery, and no high-grade stenosis on the left. Mesenteric arterial disease with greater than 50% narrowing at the origin of the SMA, and associated aortic atherosclerosis. Aortic Atherosclerosis (ICD10-I70.0). Additional ancillary findings as above. Signed, JDulcy Fanny WNadene Rubins RPVI [facility] Electronically Signed   By: JCorrie MckusickD.O.   On: 11/11/2021 08:56   MR ANGIO PELVIS W WO CONTRAST  Result Date: 11/11/2021 CLINICAL DATA:  81year old male with hypertension EXAM: MRA ABDOMEN AND  PELVIS WITH CONTRAST TECHNIQUE: Multiplanar, multiecho pulse sequences of the abdomen and pelvis were obtained with intravenous contrast. Angiographic images of abdomen and pelvis were obtained using MRA technique with intravenous contrast. CONTRAST:  135mGADAVIST GADOBUTROL 1 MMOL/ML IV SOLN COMPARISON:  None FINDINGS: MRA ABDOMEN/pelvis FINDINGS Vascular: Aorta: Diameter at the hiatus measures 2.4 cm. Normal course caliber and contour. Mild atherosclerotic plaque in the lower abdominal aorta without significant calcifications. No wall thickening or periaortic fluid. Celiac artery: Maintained flow signal with no significant atherosclerotic plaque or narrowing by MR imaging. Typical branch pattern with flow signal maintained in the branches. SMA: At least 50% narrowing at the origin of the SMA secondary to presumed atherosclerotic plaque. Maintain flow signal within the branch  vessels. Renal arteries: Right: No significant stenosis at the origin the right renal artery. There is at least 50% narrowing in the mid segment of the renal artery. No pre hilar branches identified. Left renal artery: Flow signal maintained at the origin. Evidence of atherosclerotic plaque at the renal artery origin, with less than 50% narrowing. No pre hilar branches identified. Right lower extremity: Normal course caliber and contour of the right iliac system. Flow signal maintained with no high-grade stenosis or occlusion. Hypogastric artery maintains flow signal at the origin though appears narrowed. Common femoral artery patent without high-grade stenosis. Proximal profunda femoris and SFA maintained flow. Left lower extremity: Normal course caliber and contour of the left iliac system. Flow signal maintained with no evidence of high-grade stenosis or occlusion. Hypogastric artery demonstrates maintained flow signal. Common femoral artery demonstrates no stenosis. Proximal profunda femoris and SFA demonstrate maintained flow signal.  Venous: Unremarkable hepatic venous and portal venous system. Unremarkable IVC and iliac veins.  Unremarkable renal veins. Nonvascular: No acute finding at the lung base Hepatic biliary: Uniform signal of the liver parenchyma. Unremarkable gallbladder. Pancreas: Uniform signal Spleen: Uniform signal, not enlarged. Kidneys/adrenal glands: Unremarkable adrenal glands No hydronephrosis with symmetric size of the kidneys. Gastrointestinal: Unremarkable small bowel and stomach. Colonic diverticula. No inflammatory changes identified by MR imaging. Mesentery/lymphatic: Unremarkable Musculoskeletal: Multilevel degenerative changes. Fat signal lesion within L3, compatible with osseous hemangioma. Pelvic structures: Unremarkable Additional: None IMPRESSION: MR angiogram demonstrates single renal arteries bilaterally, with greater than 50% stenosis in the mid right renal artery, and no high-grade stenosis on the left. Mesenteric arterial disease with greater than 50% narrowing at the origin of the SMA, and associated aortic atherosclerosis. Aortic Atherosclerosis (ICD10-I70.0). Additional ancillary findings as above. Signed, Dulcy Fanny. Nadene Rubins, RPVI [facility] Electronically Signed   By: [name]   On: 11/11/2021 08:56   US RENAL ARTERY DUPLEX COMPLETE  Result Date: 11/10/2021 CLINICAL DATA:  Hypertension EXAM: RENAL/URINARY TRACT ULTRASOUND RENAL DUPLEX DOPPLER ULTRASOUND COMPARISON:  None Available. FINDINGS: Right Kidney: Length: 9.9 cm. Echogenicity within normal limits. No mass or hydronephrosis visualized. Left Kidney: Length: 12.3 cm. Echogenicity within normal limits. No mass or hydronephrosis visualized. Bladder:  Within normal limits RENAL DUPLEX ULTRASOUND Right Renal Artery Velocities: Origin:  200 cm/sec Mid:  96 cm/sec Hilum:  173 cm/sec Interlobar:  53 cm/sec Arcuate:  32 cm/sec Left Renal Artery Velocities: Origin:  Not visualized  Mid:  190 cm/sec Hilum:  230 cm/sec Interlobar:  51 cm/sec Arcuate:  36 cm/sec Aortic Velocity:  120 cm/sec Right Renal-Aortic Ratios: Origin: 1.7 Mid:  0.8 Hilum: 1.4 Interlobar: 0.4 Arcuate: 0.3 Left Renal-Aortic Ratios: Origin: Not seen Mid: 1.6 Hilum: 1.9 Interlobar: 0.4 Arcuate: 0.3 IMPRESSION: 1. Unfortunately limited diagnostic quality of exam secondary to excessive bowel gas. However, measurements of peak systolic velocity 761 centimeters/second or greater are visualized in the proximal right renal artery and the mid left renal artery. This raises the possibility of underlying renal artery stenosis. Consider further evaluation with CT or MR angiography. 2. Sonographically unremarkable appearance of the kidneys. Electronically Signed   By: Jacqulynn Cadet M.D.   On: 11/10/2021 07:08   ECHOCARDIOGRAM COMPLETE  Result Date: 11/09/2021    ECHOCARDIOGRAM REPORT   Patient Name:   Seymore Brodowski Crotteau Date of Exam: 11/09/2021 Medical Rec #:  [id number]        Height:       68.5 in Accession #:    [id number]  Weight:       206.6 lb Date of Birth:  [birth year]        BSA:          2.083 m Patient Age:    53 years         BP:           145/61 mmHg Patient Gender: M                HR:           52 bpm. Exam Location:  [facility] Procedure: 2D Echo and Intracardiac Opacification Agent Indications:     Diastolic CHF  History:         Patient has prior history of Echocardiogram examinations. CAD,                  Arrythmias:Atrial Fibrillation; Risk Factors:Hypertension,                  Dyslipidemia and Diabetes.  Sonographer:     L Thornton-Maynard Referring Phys:  Eugenie Norrie, A Diagnosing Phys: Fransico Him MD  Sonographer Comments: Suboptimal apical window. IMPRESSIONS  1. Left ventricular ejection fraction, by estimation, is 70 to 75%. The left ventricle has hyperdynamic function. The left ventricle has no regional wall motion abnormalities. There is mild asymmetric left ventricular hypertrophy. Left ventricular  diastolic function could not be evaluated. Elevated left ventricular end-diastolic pressure.  2. Right ventricular systolic function is normal. The right ventricular size is normal. Tricuspid regurgitation signal is inadequate for assessing PA pressure.  3. Right atrial size was moderately dilated.  4. The mitral valve is degenerative. No evidence of mitral valve regurgitation. No evidence of mitral stenosis. The mean mitral valve gradient is 3.0 mmHg.  5. The aortic valve is calcified. Aortic valve regurgitation is trivial. Moderate aortic valve stenosis. Aortic valve area, by VTI measures 1.27 cm. Aortic valve mean gradient measures 21.0 mmHg. Aortic valve Vmax measures 3.22 m/s.  6. Aortic dilatation noted. There is borderline dilatation of the aortic root, measuring 37 mm.  7. The inferior vena cava is dilated in size with >50% respiratory variability, suggesting right atrial pressure of 8 mmHg.  8. Evidence of atrial level shunting detected by color flow Doppler. There is a small patent foramen ovale with bidirectional shunting across atrial septum. FINDINGS  Left Ventricle: Left ventricular ejection fraction, by estimation, is 70 to 75%. The left ventricle has hyperdynamic function. The left ventricle has no regional wall motion abnormalities. Definity contrast agent was given IV to delineate the left ventricular endocardial borders. The left ventricular internal cavity size was normal in size. There is mild asymmetric left ventricular hypertrophy. Left ventricular diastolic function could not be evaluated. Elevated left ventricular end-diastolic pressure. Right Ventricle: The right ventricular size is normal. No increase in right ventricular wall thickness. Right ventricular systolic function is normal. Tricuspid regurgitation signal is inadequate for assessing PA pressure. Left Atrium: Left atrial size was normal in size. Right Atrium: Right atrial size was moderately dilated. Pericardium: There is no  evidence of pericardial effusion. Mitral Valve: The mitral valve is degenerative in appearance. There is mild thickening of the mitral valve leaflet(s). There is moderate calcification of the mitral valve leaflet(s). Mild to moderate mitral annular calcification. No evidence of mitral valve regurgitation. No evidence of mitral valve stenosis. MV peak gradient, 13.4 mmHg. The mean mitral valve gradient is 3.0 mmHg. Tricuspid Valve: The tricuspid valve is normal in structure. Tricuspid valve regurgitation is not demonstrated.  No evidence of tricuspid stenosis. Aortic Valve: The aortic valve is calcified. Aortic valve regurgitation is trivial. Moderate aortic stenosis is present. Aortic valve mean gradient measures 21.0 mmHg. Aortic valve peak gradient measures 41.3 mmHg. Aortic valve area, by VTI measures 1.27  cm. Pulmonic Valve: The pulmonic valve was normal in structure. Pulmonic valve regurgitation is not visualized. No evidence of pulmonic stenosis. Aorta: Aortic dilatation noted. There is borderline dilatation of the aortic root, measuring 37 mm. Venous: The inferior vena cava is dilated in size with greater than 50% respiratory variability, suggesting right atrial pressure of 8 mmHg. IAS/Shunts: The interatrial septum appears to be lipomatous. Evidence of atrial level shunting detected by color flow Doppler. A small patent foramen ovale is detected with bidirectional shunting across atrial septum.  LEFT VENTRICLE PLAX 2D LVIDd:         4.90 cm     Diastology LVIDs:         2.30 cm     LV e' medial:    4.90 cm/s LV PW:         1.00 cm     LV E/e' medial:  31.4 LV IVS:        1.40 cm     LV e' lateral:   8.16 cm/s LVOT diam:     2.20 cm     LV E/e' lateral: 18.9 LV SV:         81 LV SV Index:   39 LVOT Area:     3.80 cm  LV Volumes (MOD) LV vol d, MOD A2C: 40.5 ml LV vol d, MOD A4C: 44.0 ml LV vol s, MOD A2C: 3.4 ml LV vol s, MOD A4C: 6.0 ml LV SV MOD A2C:     37.1 ml LV SV MOD A4C:     44.0 ml LV SV MOD BP:       38.7 ml RIGHT VENTRICLE RV S prime:     10.90 cm/s TAPSE (M-mode): 2.5 cm LEFT ATRIUM             Index        RIGHT ATRIUM           Index LA diam:        4.30 cm 2.06 cm/m   RA Area:     26.80 cm LA Vol (A2C):   57.1 ml 27.41 ml/m  RA Volume:   83.60 ml  40.13 ml/m LA Vol (A4C):   59.5 ml 28.56 ml/m LA Biplane Vol: 62.8 ml 30.14 ml/m  AORTIC VALVE                     PULMONIC VALVE AV Area (Vmax):    1.38 cm      PV Vmax:       1.77 m/s AV Area (Vmean):   1.32 cm      PV Vmean:      129.000 cm/s AV Area (VTI):     1.27 cm      PV VTI:        0.350 m AV Vmax:           321.50 cm/s   PV Peak grad:  12.5 mmHg AV Vmean:          208.500 cm/s  PV Mean grad:  7.0 mmHg AV VTI:            0.633 m AV Peak Grad:      41.3 mmHg AV Mean Grad:  21.0 mmHg LVOT Vmax:         117.00 cm/s LVOT Vmean:        72.300 cm/s LVOT VTI:          0.212 m LVOT/AV VTI ratio: 0.33  AORTA Ao Root diam: 3.70 cm MITRAL VALVE MV Area (PHT): 3.27 cm     SHUNTS MV Area VTI:   1.48 cm     Systemic VTI:  0.21 m MV Peak grad:  13.4 mmHg    Systemic Diam: 2.20 cm MV Mean grad:  3.0 mmHg MV Vmax:       1.83 m/s MV Vmean:      72.4 cm/s MV Decel Time: 232 msec MV E velocity: 154.00 cm/s Fransico Him MD Electronically signed by Fransico Him MD Signature Date/Time: 11/09/2021/10:42:15 PM    Final       Time coordinating discharge: over 30 minutes  SIGNED:  Emeterio Reeve DO [facility]

## 2021-11-13 NOTE — Plan of Care (Signed)

## 2021-11-13 NOTE — Op Note (Signed)
[facility]  Percutaneous Study/Intervention Procedural Note    Surgeon(s): [employer]   Assistants: None  Pre-operative Diagnosis: Bilateral renal artery stenosis, renovascular hypertension  Post-operative diagnosis:  Same  Procedure(s) Performed:             1.  Ultrasound guidance for vascular access right femoral artery             2.  Catheter placement into the bilateral renal arteries from right femoral approach             3.  Aortogram and selective bilateral renal angiogram             4.  StarClose closure device right femoral artery              Contrast: 35 cc  EBL: 5 cc   Fluoro Time: 1.3 minutes  Moderate conscious sedation: Approximately 22 minutes with 2 mg of Versed and 100 mcg of Fentanyl  Indications:  The patient is admitted with severe hypertension and has renal artery duplex which suggested some degree of renal artery stenosis bilaterally albeit with a limited study.  Given the clinical scenario and the noninvasive findings, angiogram is indicated for further evaluation of her renal artery and potential treatment. Risks and benefits are discussed and informed consent is obtained.  Procedure:  The patient was identified and appropriate procedural time out was performed.  The patient was then placed supine on the table and prepped and draped in the usual sterile fashion. Moderate conscious sedation was administered with a face to face encounter with the patient throughout the procedure with my supervision of the RN administering medicines and monitoring the patients vital signs and mental status throughout from the start of the procedure until the patient was taken to the recovery room  Ultrasound was used to evaluate the right common femoral artery.  It was patent .  A digital ultrasound image was acquired.  A Seldinger needle was used to access the right common femoral artery under direct ultrasound guidance and a permanent image was  performed.  A 0.035 J wire was advanced without resistance and a 5Fr sheath was placed.  Pigtail catheter was placed into the aorta at the L1 level and an AP aortogram was performed. This demonstrated mild calcific disease of the aorta and iliac arteries without hemodynamically significant stenosis.  The origins of the renal arteries came off at the same level and then elected to selectively cannulate the renal arteries to further evaluate the angiography. The patient was then systemically heparinized with 4000 units of intravenous heparin. I used a C2 catheter to cannulate the right renal artery and selective imaging was performed. This demonstrated no stenosis at the origin of the right renal artery with a mid right renal artery calcific lesion that measured out between 30 and 40% stenosis but was not hemodynamically significant.  Multiple views were taken.  I then used a C2 catheter to selectively cannulate the left renal artery without difficulty.  Selective left renal artery angiography was then performed.  This demonstrated no significant disease on the left.  Both kidneys appeared to have some medical renal disease with high resistance distally, but no hemodynamically significant renal artery stenosis was identified that would benefit from treatment.  Oblique arteriogram was performed of the right femoral artery and StarClose closure device was deployed in the usual fashion with excellent hemostatic result. The patient was taken to the recovery room in stable condition having tolerated the procedure  well.  Findings:               Aortogram/Renal Arteries: Mid right renal artery stenosis that was only in the 30 to 40% range by vessel analysis and diameter measurements.  The left renal artery not have any significant stenosis.   Condition:  Stable  Complications: None   [name] 11/13/2021 10:53 AM  This note was created with Dragon Medical transcription system. Any errors in dictation are purely  unintentional.

## 2021-11-13 NOTE — Interval H&P Note (Signed)
History and Physical Interval Note:  11/13/2021 10:18 AM  Todd Valentine  has presented today for surgery, with the diagnosis of renal artery stenosis.  The various methods of treatment have been discussed with the patient and family. After consideration of risks, benefits and other options for treatment, the patient has consented to  Procedure(s): RENAL ANGIOGRAPHY (Right) as a surgical intervention.  The patient's history has been reviewed, patient examined, no change in status, stable for surgery.  I have reviewed the patient's chart and labs.  Questions were answered to the patient's satisfaction.     [name]

## 2021-11-14 ENCOUNTER — Telehealth: Payer: Self-pay | Admitting: Cardiovascular Disease

## 2021-11-14 ENCOUNTER — Encounter: Payer: Self-pay | Admitting: Vascular Surgery

## 2021-11-14 NOTE — Telephone Encounter (Signed)
Pt c/o medication issue:  1. Name of Medication:  carvedilol (COREG) 25 MG tablet  2. How are you currently taking this medication (dosage and times per day)?   3. Are you having a reaction (difficulty breathing--STAT)?   4. What is your medication issue?   Patient's daughter is requesting to clarify whether or not the patient needs to continue taking this medication. Please advise.

## 2021-11-14 NOTE — Telephone Encounter (Signed)
I spoke with the patient's daughter, Butch Penny (ok per DPR).  I have advised her that in reviewing the patient's chart I do see that he was bradycardiac during his hospitalization and his beta blocker was stopped.  However, there is a note from Dr. Maryland Pink dated 11/12/21: Symptomatic bradycardia-resolved as of 11/11/2021 Cardiology following.  Cardizem, beta-blocker and clonidine held initially started to have episodes of heart rate above 100 so have restarted beta-blocker at lower dose.    Per the patient's discharge medication list, his diltiazem was stopped along with clonidine, but he was to be taking coreg 25 mg: 0.5 tablet (12.5 mg) BID.  Butch Penny advised the patient is taking the coreg, but was afraid that this was to have been stopped.   I have reviewed the above MD notation with her. I have advised her to have the patient continue with coreg 12.5 mg BID at this time and monitor his HR's. He is due to see Ignacia Bayley, NP on 12/03/21. They will call back sooner if needed for low HR's.  Butch Penny voices understanding of the above and is agreeable.

## 2021-11-18 ENCOUNTER — Encounter: Payer: Self-pay | Admitting: Cardiovascular Disease

## 2021-11-18 ENCOUNTER — Other Ambulatory Visit: Payer: Self-pay | Admitting: Cardiovascular Disease

## 2021-11-21 ENCOUNTER — Telehealth: Payer: Self-pay | Admitting: Cardiovascular Disease

## 2021-11-21 NOTE — Telephone Encounter (Signed)
Patient's daughter is calling about a MyChart message she sent over 48 hours ago about blood pressure medications.  She states patient was just recently in the hospital for his blood pressure.

## 2021-11-21 NOTE — Telephone Encounter (Signed)
Patients daughter called and is upset that they have not heard back on the medication questions she sent through [place]. She thought patient was to stop Carvedilol, but hospital did not stop it.  Also, she thought patient was to be on Eliquis 5 MG twice a day, but now see's 2.5 MG twice a day in the list. She is requesting clarification asap. Will route to Dr. Simon Rhein as high priority.

## 2021-11-22 NOTE — Telephone Encounter (Signed)
Minna Merritts, MD  [facility] Triage 14 hours ago (5:51 PM)    I went through the list that she sent,  we are missing four blood pressure medications off their list  Recent discharge from the hospital  The list that she sent me is missing Imdur, losartan, hydralazine, doxazosin  We need to find out if he is taking these medications  Thx  TG

## 2021-11-22 NOTE — Telephone Encounter (Addendum)
See 11/18/21 mychart encounter.

## 2021-11-25 NOTE — Progress Notes (Deleted)
   Patient ID: Todd Valentine, male    DOB: November 30, 1940, 81 y.o.   MRN: [id number]  HPI  Todd Valentine is a 81 y/o male with a history of  Echo report from 11/09/21 reviewed and showed an EF of 70-75% along with mild LVH, moderate RAE and moderate AS with aortic valve mean gradient measures 21.0 mmHg. Aortic valve Vmax measures 3.22 m/s.  Admitted 11/07/21 due to SOB and bradycardia. Initially given IV hydralazine and lasix. Found to have right renal stenosis. Cardiology and vascular consults obtained. Changed to oral medications and plan for outpatient angiogram. Discharged after 6 days.   He presents today for an initial visit with a chief complaint of   Review of Systems    Physical Exam     Assessment & Plan:  1: Chronic heart failure with preserved ejection fraction with structural changes (mild LVH)- - NYHA class - BNP 05/11/16 was 459.0  2: HTN- - BP - saw PCP @ [facility] 06/27/21 - BMP 11/13/21 reviewed and showed sodium 140, potassium 3.9, creatinine 1.43 & GFR 50  3: DM with CKD- - saw nephrology Holley Raring) 10/15/21 - A1c 10/15/21 was 8.0%  4: Atrial fibrillation- - saw cardiology Rockey Situ) 12/10/20; returns 12/03/21  5: Tobacco use-

## 2021-11-26 ENCOUNTER — Ambulatory Visit: Payer: Medicare Other | Admitting: Family

## 2021-11-29 ENCOUNTER — Other Ambulatory Visit (HOSPITAL_BASED_OUTPATIENT_CLINIC_OR_DEPARTMENT_OTHER): Payer: Self-pay | Admitting: Osteopathic Medicine

## 2021-12-02 NOTE — Progress Notes (Unsigned)
Cardiology Office Note  Date:  12/02/2021   ID:  Todd Valentine, DOB 12-19-1940, Todd Valentine  PCP:  Todd Jakes, MD   No chief complaint on file.   HPI:  [name] is a very pleasant 81 year old gentleman with  long history of smoking for 30-40 years,  alcohol use,  obesity,  chronic diastolic CHF,Echo March 9562 with ejection fraction 50-55% chronic atrial fibrillation,  hypertension, late type 2 diabetes,  Significant coronary disease by catheterization August 2015 three-vessel  sleep apnea, uses his CPAP periodically presenting for follow-up of his chronic atrial fibrillation, diastolic CHF, hypertension  Last seen by myself in clinic October 2022 Hospitalized September 2023 for bradycardia, shortness of breath Clonidine diltiazem carvedilol held   Lives alone with supportive family after wife unexpectedly passed away Seen in clinic 2020-03-13  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 Sedentary other days Previously reported drop in blood pressure late morning, late at night, with orthostasis symptoms, blood pressure systolic 130 History of labile pressures 865 to 784 systolic at home  Medication intolerances, spironolactone, malaise, dizziness  Current regiment In the a.m., takes carvedilol 12.5 mg daily clonidine 0.1 in Am, diltiazem ER 120 mg daily Lasix 20 (not sat or Sunday) Clonidine 0.1 at noon In the PM clonidine 0.2, carvedilol 12.5,  tamsulosin,  losartan 100  Farxiga   Labs: A1c 8.4 Total cholesterol 109 LDL 49 No recent BMP, prior creatinine 1.3  EKG personally reviewed by myself on todays visit Atrial fibrillation right bundle branch block rate 58  Other past medical history reviewed Hospital admission 05/08/16 for PNA,sepsis, atrial fib with RVR On bipap For many days, ABX. Declined ventilator if needed acute kidney injury/ATN,  requiring CRRT Diltiazem dose increased at d/c up to 240 daily D/c on pradaxa (was  going to change to eliquis) Off lisinopril/HCTZ, metformin  Labs at d/c  CR 1.64, BUN 33  total cholesterol well controlled, 130 or less    hospital admission 10/02/2013 with discharge August 12. He presented with shortness of breath, Atrial fibrillation with RVR. Echocardiogram showed normal ejection fraction. Symptoms improved with diuresis.   Echocardiogram 10/02/2013 showing ejection fraction 55-60%, mildly dilated left atrium, mild to moderate MR and TR   He had cardiac catheterization 10/06/2013 showing occluded diagonal vessel, 40% proximal LAD disease, bifurcating marginal branch/ramus. Ramus had 60% followed by 80% disease, OM with 50% mid disease at least. RCA with 40% mid disease. Medical management recommended    PMH:   has a past medical history of Acute renal failure (Todd Valentine), BPH (benign prostatic hyperplasia), Chronic atrial fibrillation ([name]), Chronic diastolic CHF (congestive heart failure) (Todd Valentine), Chronic insomnia, Coronary artery disease, non-occlusive (10/06/2013), Depression, Diabetes mellitus with complication ([place]), Diverticulitis of colon, Dysrhythmia, Heart murmur, History of diverticulitis, Hypercholesterolemia, Hyperkalemia, Hyperlipidemia, Hyperlipidemia, Hypertension, Hypokalemia, Hypomagnesemia, Malignant melanoma (Todd Valentine), Melanoma (Todd Valentine), Mitral regurgitation, and Sleep apnea in adult.  PSH:    Past Surgical History:  Procedure Laterality Date   APPENDECTOMY     CARDIAC CATHETERIZATION  10/06/2013   COLONOSCOPY     COLONOSCOPY WITH PROPOFOL N/A 11/23/2014   Procedure: COLONOSCOPY WITH PROPOFOL;  Surgeon: Todd Silvas, MD;  Location: [facility] ENDOSCOPY;  Service: Endoscopy;  Laterality: N/A;   COLONOSCOPY WITH PROPOFOL N/A 04/11/2020   Procedure: COLONOSCOPY WITH PROPOFOL;  Surgeon: Todd Valentine, Todd Pike, MD;  Location: [facility] ENDOSCOPY;  Service: Gastroenterology;  Laterality: N/A;  DM  ELIQUIS   MOHS SURGERY     removal tubular adenoma  RENAL ANGIOGRAPHY Right  11/13/2021   Procedure: RENAL ANGIOGRAPHY;  Surgeon: Todd Huxley, MD;  Location: [place] CV LAB;  Service: Cardiovascular;  Laterality: Right;    Current Outpatient Medications on File Prior to Visit  Medication Sig Dispense Refill   acetaminophen (TYLENOL) 500 MG tablet Take 1,000 mg by mouth every 6 (six) hours as needed for mild pain, fever or headache.      amLODipine (NORVASC) 10 MG tablet Take 1 tablet (10 mg total) by mouth daily. 30 tablet 0   apixaban (ELIQUIS) 5 MG TABS tablet Take 1 tablet (5 mg total) by mouth 2 (two) times daily. 180 tablet 3   doxazosin (CARDURA) 8 MG tablet Take 1 tablet (8 mg total) by mouth at bedtime. 30 tablet 0   ezetimibe (ZETIA) 10 MG tablet Take 1 tablet by mouth once daily 90 tablet 0   famotidine (PEPCID) 20 MG tablet Take 1 tablet (20 mg total) by mouth at bedtime.     FARXIGA 10 MG TABS tablet Take 1 tablet (10 mg total) by mouth daily. 90 tablet 3   furosemide (LASIX) 20 MG tablet Take 1 tablet (20 mg total) by mouth daily. Except on Saturdays and Sundays 90 tablet 3   glipiZIDE (GLUCOTROL XL) 5 MG 24 hr tablet Take 5 mg by mouth daily with breakfast.     hydrALAZINE (APRESOLINE) 100 MG tablet Take 1 tablet (100 mg total) by mouth every 8 (eight) hours. 90 tablet 0   isosorbide mononitrate (IMDUR) 60 MG 24 hr tablet Take 1 tablet (60 mg total) by mouth daily. 30 tablet 0   linagliptin (TRADJENTA) 5 MG TABS tablet Take 5 mg by mouth daily.     losartan (COZAAR) 100 MG tablet Take 1 tablet (100 mg total) by mouth daily. 90 tablet 3   magnesium oxide (MAG-OX) 400 (241.3 Mg) MG tablet Take 1 tablet by mouth daily.     nitroGLYCERIN (NITROSTAT) 0.4 MG SL tablet Place 1 tablet (0.4 mg total) under the tongue every 5 (five) minutes as needed for chest pain. (Patient not taking: Reported on 10/03/2021) 25 tablet 3   Potassium Chloride ER 20 MEQ TBCR Take 20 mEq by mouth daily. 90 tablet 3   rosuvastatin (CRESTOR) 5 MG tablet Take 1 tablet by mouth once  daily 90 tablet 0   tamsulosin (FLOMAX) 0.4 MG CAPS capsule Take 0.4 mg by mouth daily.      traZODone (DESYREL) 50 MG tablet Take 50 mg by mouth at bedtime as needed for sleep.     No current facility-administered medications on file prior to visit.    Allergies:   Hydrocodone-acetaminophen, Aspirin, Ciprofloxacin, and Sitagliptin   Social History:  The patient  reports that he quit smoking about 34 years ago. His smoking use included cigarettes. He has a 90.00 pack-year smoking history. He has never used smokeless tobacco. He reports current alcohol use. He reports that he does not currently use drugs.   Family History:   Family history is unknown by patient.    Review of Systems: Review of Systems  Constitutional: Negative.   HENT: Negative.    Respiratory: Negative.    Cardiovascular: Negative.   Gastrointestinal: Negative.   Musculoskeletal: Negative.        Gait instability  Neurological: Negative.   Psychiatric/Behavioral: Negative.    All other systems reviewed and are negative.   PHYSICAL EXAM: VS:  There were no vitals taken for this visit. , BMI There is no height or  weight on file to calculate BMI. Constitutional:  oriented to person, place, and time. No distress.  HENT:  Head: Grossly normal Eyes:  no discharge. No scleral icterus.  Neck: No JVD, no carotid bruits  Cardiovascular: Regular rate and rhythm, no murmurs appreciated Pulmonary/Chest: Clear to auscultation bilaterally, no wheezes or rails Abdominal: Soft.  no distension.  no tenderness.  Musculoskeletal: Normal range of motion Neurological:  normal muscle tone. Coordination normal. No atrophy Skin: Skin warm and dry Psychiatric: normal affect, pleasant  Recent Labs: 11/07/2021: ALT 25 11/08/2021: Hemoglobin 15.3; Magnesium 2.1; Platelets 113; TSH 1.921 11/13/2021: BUN 20; Creatinine, Ser 1.43; Potassium 3.9; Sodium 140    Lipid Panel Lab Results  Component Value Date   CHOL 97 (L) 08/25/2017    HDL 30 (L) 08/25/2017   LDLCALC 41 08/25/2017   TRIG 128 08/25/2017    Wt Readings from Last 3 Encounters:  11/08/21 206 lb 9.6 oz (93.7 kg)  10/03/21 209 lb (94.8 kg)  12/10/20 211 lb 2 oz (95.8 kg)     ASSESSMENT AND PLAN:  Chronic atrial fibrillation ([name]) - Plan: EKG 12-Lead Rate controlled, on eliquis On, coreg 12.5 BID Rate controlled, no sx  Essential hypertension Continue current regimen as detailed above Extra clonidine in the p.m. 0.2  chronic diastolic CHF (congestive heart failure) ([name]) - Stable renal function On Lasix 5 days a week, not on Saturday Sunday  Mixed hyperlipidemia  Cholesterol is at goal on the current lipid regimen. No changes to the medications were made.  Type 2 diabetes mellitus with complication, without long-term current use of insulin ([place]) - Plan: EKG 12-Lead HBA1c 7.4 We have encouraged continued exercise, careful diet management in an effort to lose weight.  Acute on chronic renal failure Stable numbers Followed by nephrology   Total encounter time more than 25 minutes  Greater than 50% was spent in counseling and coordination of care with the patient     No orders of the defined types were placed in this encounter.     Signed, Esmond Plants, M.D., Ph.D. 12/02/2021  [place], [place]

## 2021-12-03 ENCOUNTER — Other Ambulatory Visit (HOSPITAL_BASED_OUTPATIENT_CLINIC_OR_DEPARTMENT_OTHER): Payer: Self-pay | Admitting: Osteopathic Medicine

## 2021-12-03 ENCOUNTER — Encounter: Payer: Self-pay | Admitting: Cardiovascular Disease

## 2021-12-03 ENCOUNTER — Ambulatory Visit: Payer: Medicare Other | Attending: Cardiovascular Disease | Admitting: Cardiovascular Disease

## 2021-12-03 VITALS — BP 132/60 | HR 65 | Ht 68.5 in | Wt 218.6 lb

## 2021-12-03 DIAGNOSIS — I10 Essential (primary) hypertension: Secondary | ICD-10-CM | POA: Insufficient documentation

## 2021-12-03 DIAGNOSIS — I25118 Atherosclerotic heart disease of native coronary artery with other forms of angina pectoris: Secondary | ICD-10-CM | POA: Insufficient documentation

## 2021-12-03 DIAGNOSIS — E118 Type 2 diabetes mellitus with unspecified complications: Secondary | ICD-10-CM | POA: Diagnosis not present

## 2021-12-03 DIAGNOSIS — R600 Localized edema: Secondary | ICD-10-CM | POA: Insufficient documentation

## 2021-12-03 DIAGNOSIS — I739 Peripheral vascular disease, unspecified: Secondary | ICD-10-CM | POA: Diagnosis present

## 2021-12-03 DIAGNOSIS — R0602 Shortness of breath: Secondary | ICD-10-CM | POA: Diagnosis present

## 2021-12-03 DIAGNOSIS — I482 Chronic atrial fibrillation, unspecified: Secondary | ICD-10-CM | POA: Diagnosis not present

## 2021-12-03 DIAGNOSIS — I5032 Chronic diastolic (congestive) heart failure: Secondary | ICD-10-CM | POA: Diagnosis not present

## 2021-12-03 DIAGNOSIS — E782 Mixed hyperlipidemia: Secondary | ICD-10-CM | POA: Diagnosis present

## 2021-12-03 MED ORDER — ROSUVASTATIN CALCIUM 5 MG PO TABS: 5.0000 mg | ORAL_TABLET | Freq: Every day | ORAL | 3 refills | Status: DC

## 2021-12-03 MED ORDER — FUROSEMIDE 20 MG PO TABS: 20.0000 mg | ORAL_TABLET | Freq: Every day | ORAL | 3 refills | Status: DC

## 2021-12-03 MED ORDER — ISOSORBIDE MONONITRATE ER 60 MG PO TB24: ORAL_TABLET | ORAL | 3 refills | Status: DC

## 2021-12-03 MED ORDER — LOSARTAN POTASSIUM 100 MG PO TABS: 100.0000 mg | ORAL_TABLET | Freq: Every day | ORAL | 3 refills | Status: DC

## 2021-12-03 MED ORDER — CARVEDILOL 25 MG PO TABS: 25.0000 mg | ORAL_TABLET | Freq: Two times a day (BID) | ORAL | 3 refills | Status: DC

## 2021-12-03 MED ORDER — EZETIMIBE 10 MG PO TABS: 10.0000 mg | ORAL_TABLET | Freq: Every day | ORAL | 3 refills | Status: DC

## 2021-12-03 MED ORDER — HYDRALAZINE HCL 100 MG PO TABS: 100.0000 mg | ORAL_TABLET | Freq: Three times a day (TID) | ORAL | 3 refills | Status: DC

## 2021-12-03 MED ORDER — DOXAZOSIN MESYLATE 8 MG PO TABS: 8.0000 mg | ORAL_TABLET | Freq: Every day | ORAL | 3 refills | Status: DC

## 2021-12-03 MED ORDER — FARXIGA 10 MG PO TABS: 10.0000 mg | ORAL_TABLET | Freq: Every day | ORAL | 3 refills | Status: DC

## 2021-12-03 MED ORDER — AMLODIPINE BESYLATE 10 MG PO TABS: 10.0000 mg | ORAL_TABLET | Freq: Every day | ORAL | 3 refills | Status: DC

## 2021-12-03 NOTE — Patient Instructions (Addendum)
Medication Instructions:  Hold coreg for rate <55 Lasix 20 mg daily with extra lasix 20 mg at lunch as needed for leg swelling  A.m. Hydralazine 100 every Isosorbide 60 in the morning Carvedilol 12.5 in the PM Losartan 100 daily Lasix in AM  3 pm:  hydralazine  Pm Doxazosin 8 mg Isosorbide 30 in the evening Coreg 12.5 in the PM Amlodipine 10 mg daily  Bedtime:  Hydralazine 100 mg    If you need a refill on your cardiac medications before your next appointment, please call your pharmacy.   Lab work: No new labs needed  Testing/Procedures: Your physician has requested that you have a lower extremity arterial duplex. During this test, ultrasound is used to evaluate arterial blood flow in the legs. Allow one hour for this exam. There are no restrictions or special instructions.  Your physician has requested that you have an ankle brachial index (ABI). During this test an ultrasound and blood pressure cuff are used to evaluate the arteries that supply the arms and legs with blood. Allow thirty minutes for this exam. There are no restrictions or special instructions.  Follow-Up: At [facility], you and your health needs are our priority.  As part of our continuing mission to provide you with exceptional heart care, we have created designated Provider Care Teams.  These Care Teams include your primary Cardiologist (physician) and Advanced Practice Providers (APPs -  Physician Assistants and Nurse Practitioners) who all work together to provide you with the care you need, when you need it.  You will need a follow up appointment in 3 months  Providers on your designated Care Team:   Murray Hodgkins, NP Christell Faith, PA-C Cadence Kathlen Mody, [place]  COVID-19 Vaccine Information can be found at: ShippingScam.co.uk For questions related to vaccine distribution or appointments, please email [email or web address] .[email or web address] or call  [phone].

## 2021-12-03 NOTE — Addendum Note (Signed)
Addended by: Minna Merritts on: 12/03/2021 12:03 PM   Modules accepted: Level of Service

## 2021-12-04 ENCOUNTER — Ambulatory Visit: Payer: Medicare Other | Admitting: Family

## 2021-12-04 ENCOUNTER — Telehealth: Payer: Self-pay | Admitting: Family

## 2021-12-04 NOTE — Telephone Encounter (Signed)
Patient did not show for his initial Heart Failure Clinic appointment on 12/04/21. Will attempt to reschedule.

## 2021-12-19 ENCOUNTER — Telehealth: Payer: Self-pay | Admitting: Cardiovascular Disease

## 2021-12-19 DIAGNOSIS — I5033 Acute on chronic diastolic (congestive) heart failure: Secondary | ICD-10-CM

## 2021-12-19 NOTE — Telephone Encounter (Signed)
Called patients daughter and left a VM requesting a call back.

## 2021-12-19 NOTE — Telephone Encounter (Signed)
Spoke with patients daughter and she stated that [place] at the beach. She stated that at the last OV on 12/03/21 with Dr. Rockey Situ he recommended that the patient can take an extra lasix as needed for leg swelling. Per OV note: Lasix 20 mg daily with extra lasix 20 mg at lunch as needed for leg swelling  Daughter stated that he took an extra Lasix tablet on the 11th, 12th, 13th, and the 25th, and today the 26th. She stated that he currently has his legs up, denies SOB. He did not bring his compression stockings with him. I advised if they were able to pick up a pair while there, it would benefit him. I also advised he could take a third Furosemide dose at 4:00 today if needed, and then to continue taking the one extra tab daily. I also advised to get a BMP lab draw on Monday (in 5 days). I instructed to go to the [facility] for this. Patient has a follow up appointment with Dr. Rockey Situ next Friday 12/20/21.   Instructed to call us back if swelling does not respond to Lasix, gets worse, or patient becomes SOB. Patient and his daughter agreed with plan.

## 2021-12-19 NOTE — Telephone Encounter (Signed)
Pt c/o swelling: STAT is pt has developed SOB within 24 hours  If swelling, where is the swelling located? Feet and legs   How much weight have you gained and in what time span? Not sure   Have you gained 3 pounds in a day or 5 pounds in a week? Not sure   Do you have a log of your daily weights (if so, list)? Did not record  Are you currently taking a fluid pill? Yes yesterday around lunch and will today  Are you currently SOB? No  Have you traveled recently? The beach is about 4 hours away  Pt daughter calling because pt has been swelling in legs and feet and has taken an extra fluid pill but it doesn't seem to be as effective. Pt denies any SOB

## 2021-12-20 NOTE — Telephone Encounter (Signed)
I spoke with the patient's daughter, Butch Penny. I have advised her of Dr. Donivan Scull recommendations as stated below. Per Butch Penny, they will watch the patient's fluid/ salt intake.   The patient was scheduled for 12/27/21 at 8:40 am with Dr. Rockey Situ but this has somehow been cancelled.   We will place the patient back in that appointment slot and move the existing patient.   Butch Penny, has been made aware the appointment has been scheduled back in the 11/3 8:40 am slot.

## 2021-12-20 NOTE — Telephone Encounter (Signed)
Kavin Leech, RN  Sent: Fri December 20, 2021 12:44 PM  To: [facility] T[facility]          Message    ----- Message -----  From: Minna Merritts, MD  Sent: 12/19/2021   5:14 PM EDT  To: Kavin Leech, RN    At the beach may need to moderate fluid and salt intake  Restaurant food etc.  Suspect compression hose will help, sometimes dependent edema from riding in the car  Thx  TG

## 2021-12-20 NOTE — Telephone Encounter (Signed)
Attempted to call Butch Penny with MD recommendations.  No answer- I left a message to please call back.

## 2021-12-23 ENCOUNTER — Other Ambulatory Visit
Admission: RE | Admit: 2021-12-23 | Discharge: 2021-12-23 | Disposition: A | Discharge status: Home / Self Care | Payer: Medicare Other | Attending: Cardiovascular Disease | Admitting: Cardiovascular Disease

## 2021-12-23 DIAGNOSIS — I5033 Acute on chronic diastolic (congestive) heart failure: Secondary | ICD-10-CM | POA: Insufficient documentation

## 2021-12-23 LAB — BASIC METABOLIC PANEL
Anion gap: 9 (ref 5–15)
BUN: 30 mg/dL — ABNORMAL HIGH (ref 8–23)
CO2: 22 mmol/L (ref 22–32)
Calcium: 9 mg/dL (ref 8.9–10.3)
Chloride: 108 mmol/L (ref 98–111)
Creatinine, Ser: 1.62 mg/dL — ABNORMAL HIGH (ref 0.61–1.24)
GFR, Estimated: 43 mL/min — ABNORMAL LOW (ref >60)
Glucose, Bld: 97 mg/dL (ref 70–99)
Potassium: 3.9 mmol/L (ref 3.5–5.1)
Sodium: 139 mmol/L (ref 135–145)

## 2021-12-24 ENCOUNTER — Other Ambulatory Visit: Payer: Self-pay | Admitting: Cardiovascular Disease

## 2021-12-24 DIAGNOSIS — I482 Chronic atrial fibrillation, unspecified: Secondary | ICD-10-CM

## 2021-12-25 MED ORDER — APIXABAN 2.5 MG PO TABS: 2.5000 mg | ORAL_TABLET | Freq: Two times a day (BID) | ORAL | 11 refills | Status: DC

## 2021-12-25 NOTE — Telephone Encounter (Signed)
Refill request

## 2021-12-25 NOTE — Telephone Encounter (Signed)
Patient had '5mg'$  on med list but self reported taking 2.'5mg'$  BID.  Patient's correct dose is 2.'5mg'$  BID. Sent in refill for 2.'5mg'$ 

## 2021-12-26 ENCOUNTER — Ambulatory Visit: Payer: Medicare Other | Admitting: Hematology & Oncology

## 2021-12-26 ENCOUNTER — Inpatient Hospital Stay: Payer: Medicare Other

## 2021-12-26 NOTE — Progress Notes (Signed)
Cardiology Office Note  Date:  12/27/2021   ID:  Todd Valentine, DOB 02/23/1941, MRN [id number]  PCP:  Shelda Jakes, MD   Chief Complaint  Patient presents with   Follow up swelling     Patient c/o swelling all over & shortness of breath. Medications reviewed by the patient verbally.     HPI:  Todd Valentine is a 81 year old gentleman with  long history of smoking for 30-40 years,  alcohol use,  obesity,  chronic diastolic CHF,Echo March 7408 with ejection fraction 50-55% chronic atrial fibrillation,  hypertension, late type 2 diabetes,  Significant coronary disease by catheterization August 2015 three-vessel  sleep apnea, uses his CPAP periodically presenting for follow-up of his chronic atrial fibrillation, diastolic CHF, hypertension  Last seen by myself in clinic December 03, 2021 Medication recommendations as below were made Hold coreg for rate <55 Lasix 20 mg daily with extra lasix 20 mg at lunch as needed for leg swelling   A.m. Hydralazine 100 every Isosorbide 60 in the morning Carvedilol 12.5 in the PM Losartan 100 daily Lasix in AM   3 pm:  hydralazine   Pm Doxazosin 8 mg Isosorbide 30 in the evening Coreg 12.5 in the PM Amlodipine 10 mg daily (will stop)   Bedtime:  Hydralazine 100 mg    Received phone call December 19, 2021 for leg swelling [place] the beach Was taking extra Lasix Of note amlodipine started on recent hospitalization Swimmy head better by spreading out meds Still with claudication sx on long ambulation, relieved by resting 1 to 2 minutes  EKG personally reviewed by myself on todays visit Atrial fibrillation right bundle branch block rate 69 bpm  Other past medical history reviewed Hospitalized September 2023 for bradycardia, shortness of breath Clonidine diltiazem carvedilol held initially though lower dose carvedilol was reinstated close to discharge given runs of tachycardia Coreg 12.5 twice daily reinitiated  Currently  reports taking 17 medications in the morning, has some lightheadedness Thinks he is overmedicated in the morning Previously was taking several of his blood pressure medications in the evening and had no symptoms  Continues to have leg weakness, thinks he might go back to the gym Reports having some pain in his legs with ambulation Concerned about claudication symptoms Concerned as he has worsening leg edema  Lives alone with supportive family after wife unexpectedly passed away 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 Declined ventilator if needed acute kidney injury/ATN,  requiring CRRT Diltiazem dose increased at d/c up to 240 daily D/c on pradaxa (was going to change to eliquis) Off lisinopril/HCTZ, metformin  Labs at d/c  CR 1.64, BUN 33  total cholesterol well controlled, 130 or less    hospital admission 10/02/2013 with discharge August 12. He presented with shortness of breath, Atrial fibrillation with RVR. Echocardiogram showed normal ejection fraction. Symptoms improved with diuresis.   Echocardiogram 10/02/2013 showing ejection fraction 55-60%, mildly dilated left atrium, mild to moderate MR and TR   He had cardiac catheterization 10/06/2013 showing occluded diagonal vessel, 40% proximal LAD disease, bifurcating marginal branch/ramus. Ramus had 60% followed by 80% disease, OM with 50% mid disease at least. RCA with 40% mid disease. Medical management recommended  PMH:   has a past medical history of Acute renal failure ([place]), BPH (benign prostatic hyperplasia), Chronic atrial fibrillation ([place]),  Chronic diastolic CHF (congestive heart failure) ([name]), Chronic insomnia, Coronary artery disease, non-occlusive (10/06/2013), Depression, Diabetes mellitus with complication ([place]),  Diverticulitis of colon, Dysrhythmia, Heart murmur, History of diverticulitis, Hypercholesterolemia, Hyperkalemia, Hyperlipidemia, Hyperlipidemia, Hypertension, Hypokalemia, Hypomagnesemia, Malignant melanoma ([place]), Melanoma ([place]), Mitral regurgitation, and Sleep apnea in adult.  PSH:    Past Surgical History:  Procedure Laterality Date   APPENDECTOMY     CARDIAC CATHETERIZATION  10/06/2013   COLONOSCOPY     COLONOSCOPY WITH PROPOFOL N/A 11/23/2014   Procedure: COLONOSCOPY WITH PROPOFOL;  Surgeon: Manya Silvas, MD;  Location: [facility] ENDOSCOPY;  Service: Endoscopy;  Laterality: N/A;   COLONOSCOPY WITH PROPOFOL N/A 04/11/2020   Procedure: COLONOSCOPY WITH PROPOFOL;  Surgeon: Toledo, Benay Pike, MD;  Location: [facility] ENDOSCOPY;  Service: Gastroenterology;  Laterality: N/A;  DM  ELIQUIS   MOHS SURGERY     removal tubular adenoma     RENAL ANGIOGRAPHY Right 11/13/2021   Procedure: RENAL ANGIOGRAPHY;  Surgeon: Algernon Huxley, MD;  Location: [place] CV LAB;  Service: Cardiovascular;  Laterality: Right;    Current Outpatient Medications on File Prior to Visit  Medication Sig Dispense Refill   acetaminophen (TYLENOL) 500 MG tablet Take 1,000 mg by mouth every 6 (six) hours as needed for mild pain, fever or headache.      amLODipine (NORVASC) 10 MG tablet Take 1 tablet (10 mg total) by mouth daily. 90 tablet 3   apixaban (ELIQUIS) 2.5 MG TABS tablet Take 1 tablet (2.5 mg total) by mouth 2 (two) times daily. 60 tablet 11   carvedilol (COREG) 25 MG tablet Take 1 tablet (25 mg total) by mouth 2 (two) times daily with a meal. 180 tablet 3   doxazosin (CARDURA) 8 MG tablet Take 1 tablet (8 mg total) by mouth at bedtime. 90 tablet 3   ezetimibe (ZETIA) 10 MG tablet Take 1 tablet (10 mg total) by mouth daily. 90 tablet 3   famotidine (PEPCID) 20 MG tablet Take 1 tablet (20 mg total) by mouth at bedtime.     FARXIGA 10 MG TABS tablet Take 1 tablet (10 mg total) by mouth daily. 90 tablet 3   furosemide  (LASIX) 20 MG tablet Take 20 mg by mouth daily.     glipiZIDE (GLUCOTROL XL) 5 MG 24 hr tablet Take 5 mg by mouth daily with breakfast.     hydrALAZINE (APRESOLINE) 100 MG tablet Take 1 tablet (100 mg total) by mouth every 8 (eight) hours. 270 tablet 3   isosorbide mononitrate (IMDUR) 60 MG 24 hr tablet Take 60 mg by mouth in the morning and 30 mg in the evening. 135 tablet 3   linagliptin (TRADJENTA) 5 MG TABS tablet Take 5 mg by mouth daily.     losartan (COZAAR) 100 MG tablet Take 1 tablet (100 mg total) by mouth daily. 90 tablet 3   magnesium oxide (MAG-OX) 400 (241.3 Mg) MG tablet Take 1 tablet by mouth daily.     nitroGLYCERIN (NITROSTAT) 0.4 MG SL tablet Place 1 tablet (0.4 mg total) under the tongue every 5 (five) minutes as needed for chest pain. 25 tablet 3   Potassium Chloride ER 20 MEQ TBCR Take 1 tablet by mouth once daily 90 tablet 0   rosuvastatin (CRESTOR) 5 MG tablet Take 1 tablet (5 mg total) by mouth daily. 90 tablet 3   tamsulosin (FLOMAX) 0.4 MG CAPS capsule Take 0.4 mg by mouth daily.      traZODone (DESYREL) 50 MG tablet Take 50 mg by  mouth at bedtime as needed for sleep.     No current facility-administered medications on file prior to visit.    Allergies:   Hydrocodone-acetaminophen, Amlodipine, Aspirin, Ciprofloxacin, and Sitagliptin   Social History:  The patient  reports that he quit smoking about 34 years ago. His smoking use included cigarettes. He has a 90.00 pack-year smoking history. He has never used smokeless tobacco. He reports current alcohol use. He reports that he does not currently use drugs.   Family History:   Family history is unknown by patient.    Review of Systems: Review of Systems  Constitutional: Negative.   HENT: Negative.    Respiratory: Negative.    Cardiovascular: Negative.   Gastrointestinal: Negative.   Musculoskeletal: Negative.        Gait instability  Neurological: Negative.   Psychiatric/Behavioral: Negative.    All other  systems reviewed and are negative.   PHYSICAL EXAM: VS:  BP 110/64 (BP Location: Left Arm, Patient Position: Sitting, Cuff Size: Normal)   Pulse 69   Ht '5\' 8"'$  (1.727 m)   Wt 225 lb 6 oz (102.2 kg)   SpO2 96%   BMI 34.27 kg/m  , BMI Body mass index is 34.27 kg/m. Constitutional:  oriented to person, place, and time. No distress.  HENT:  Head: Grossly normal Eyes:  no discharge. No scleral icterus.  Neck: No JVD, no carotid bruits  Cardiovascular: Regular rate and rhythm, no murmurs appreciated Trace to 1+ lower extremity edema Pulmonary/Chest: Clear to auscultation bilaterally, no wheezes or rails Abdominal: Soft.  no distension.  no tenderness.  Musculoskeletal: Normal range of motion Neurological:  normal muscle tone. Coordination normal. No atrophy Skin: Skin warm and dry Psychiatric: normal affect, pleasant  Recent Labs: 11/07/2021: ALT 25 11/08/2021: Hemoglobin 15.3; Magnesium 2.1; Platelets 113; TSH 1.921 12/23/2021: BUN 30; Creatinine, Ser 1.62; Potassium 3.9; Sodium 139    Lipid Panel Lab Results  Component Value Date   CHOL 97 (L) 08/25/2017   HDL 30 (L) 08/25/2017   LDLCALC 41 08/25/2017   TRIG 128 08/25/2017    Wt Readings from Last 3 Encounters:  12/27/21 225 lb 6 oz (102.2 kg)  12/03/21 218 lb 9.6 oz (99.2 kg)  11/08/21 206 lb 9.6 oz (93.7 kg)     ASSESSMENT AND PLAN:  Chronic atrial fibrillation ([name]) - Plan: EKG 12-Lead Tachybradycardia syndrome hospitalized for symptomatic bradycardia September 2023 Appears stable, tolerating anticoagulation Eliquis 2.5 twice daily given age over 70 renal dysfunction  Essential hypertension A.m. Hydralazine 100 every 8hr (3 pm) Isosorbide 60 in the morning Carvedilol 12.5 in the PM Losartan 100 daily Lasix in AM Pm Doxazosin 8 mg Isosorbide 30 in the evening Coreg 12.5 Stop amlodipine Tamsulosin If blood pressure runs high without amlodipine, may need isosorbide 60 in the evening  chronic diastolic CHF  (congestive heart failure) ([name]) - Stop amlodipine, likely contributing to leg swelling Continue Lasix 20 daily Aggressive blood pressure control as above  Mixed hyperlipidemia  Could do a trial hold of Crestor for leg pain  Type 2 diabetes mellitus with complication, without long-term current use of insulin ([place]) - Plan: EKG 12-Lead Low carbohydrate diet recommended  Acute on chronic renal failure Climbing BUN/creatinine with overuse of Lasix for leg swelling recently Lasix back down to 20 mg daily  Leg pain Reports having pain in his calfs on ambulation Recommend could do a trial hold of the Crestor for 2 or 3 weeks Has lower extremity arterial Dopplers pending early December  Total encounter time more than 30 minutes  Greater than 50% was spent in counseling and coordination of care with the patient     Orders Placed This Encounter  Procedures   EKG 12-Lead      Signed, Esmond Plants, M.D., Ph.D. 12/27/2021  [place], [place]

## 2021-12-27 ENCOUNTER — Ambulatory Visit: Payer: Medicare Other | Attending: Cardiovascular Disease | Admitting: Cardiovascular Disease

## 2021-12-27 ENCOUNTER — Encounter: Payer: Self-pay | Admitting: Cardiovascular Disease

## 2021-12-27 ENCOUNTER — Ambulatory Visit: Payer: Medicare Other | Admitting: Cardiovascular Disease

## 2021-12-27 VITALS — BP 110/64 | HR 69 | Ht 68.0 in | Wt 225.4 lb

## 2021-12-27 DIAGNOSIS — E118 Type 2 diabetes mellitus with unspecified complications: Secondary | ICD-10-CM | POA: Diagnosis not present

## 2021-12-27 DIAGNOSIS — R600 Localized edema: Secondary | ICD-10-CM | POA: Diagnosis present

## 2021-12-27 DIAGNOSIS — I4821 Permanent atrial fibrillation: Secondary | ICD-10-CM | POA: Diagnosis present

## 2021-12-27 DIAGNOSIS — I701 Atherosclerosis of renal artery: Secondary | ICD-10-CM | POA: Insufficient documentation

## 2021-12-27 DIAGNOSIS — K551 Chronic vascular disorders of intestine: Secondary | ICD-10-CM | POA: Insufficient documentation

## 2021-12-27 DIAGNOSIS — E782 Mixed hyperlipidemia: Secondary | ICD-10-CM | POA: Insufficient documentation

## 2021-12-27 DIAGNOSIS — I25118 Atherosclerotic heart disease of native coronary artery with other forms of angina pectoris: Secondary | ICD-10-CM | POA: Insufficient documentation

## 2021-12-27 DIAGNOSIS — I739 Peripheral vascular disease, unspecified: Secondary | ICD-10-CM | POA: Insufficient documentation

## 2021-12-27 DIAGNOSIS — R0602 Shortness of breath: Secondary | ICD-10-CM | POA: Insufficient documentation

## 2021-12-27 DIAGNOSIS — I5033 Acute on chronic diastolic (congestive) heart failure: Secondary | ICD-10-CM | POA: Insufficient documentation

## 2021-12-27 DIAGNOSIS — I4892 Unspecified atrial flutter: Secondary | ICD-10-CM | POA: Insufficient documentation

## 2021-12-27 DIAGNOSIS — I10 Essential (primary) hypertension: Secondary | ICD-10-CM | POA: Diagnosis present

## 2021-12-27 NOTE — Patient Instructions (Addendum)
Compression hose Monitor blood pressure   Medication Instructions:  - Your physician has recommended you make the following change in your medication:   1) STOP amlodipine  2) CONTINUE lasix 20 mg daily, no extra  3) Ok to hold crestor for 2-3 weeks to see if leg pain gets better  If you need a refill on your cardiac medications before your next appointment, please call your pharmacy.    Lab work: No new labs needed   Testing/Procedures: No new testing needed   Follow-Up: At [facility], you and your health needs are our priority.  As part of our continuing mission to provide you with exceptional heart care, we have created designated Provider Care Teams.  These Care Teams include your primary Cardiologist (physician) and Advanced Practice Providers (APPs -  Physician Assistants and Nurse Practitioners) who all work together to provide you with the care you need, when you need it.  You will need a follow up appointment in 6 months  Providers on your designated Care Team:   Murray Hodgkins, NP Christell Faith, PA-C Cadence Kathlen Mody, [place]  COVID-19 Vaccine Information can be found at: ShippingScam.co.uk For questions related to vaccine distribution or appointments, please email [email or web address] or call [phone].    How to Take Your Blood Pressure Blood pressure measures how strongly your blood is pressing against the walls of your arteries. Arteries are blood vessels that carry blood from your heart throughout your body. You can take your blood pressure at home with a machine. You may need to check your blood pressure at home: To check if you have high blood pressure (hypertension). To check your blood pressure over time. To make sure your blood pressure medicine is working. Supplies needed: Blood pressure machine, or monitor. A chair to sit in. This should be a chair where you can sit upright with your back  supported. Do not sit on a soft couch or an armchair. Table or desk. Small notebook. Pencil or pen. How to prepare Avoid these things for 30 minutes before checking your blood pressure: Having drinks with caffeine in them, such as coffee or tea. Drinking alcohol. Eating. Smoking. Exercising. Do these things five minutes before checking your blood pressure: Go to the bathroom and pee (urinate). Sit in a chair. Be quiet. Do not talk. How to take your blood pressure Follow the instructions that came with your machine. If you have a digital blood pressure monitor, these may be the instructions: Sit up straight. Place your feet on the floor. Do not cross your ankles or legs. Rest your left arm at the level of your heart. You may rest it on a table, desk, or chair. Pull up your shirt sleeve. Wrap the blood pressure cuff around the upper part of your left arm. The cuff should be 1 inch (2.5 cm) above your elbow. It is best to wrap the cuff around bare skin. Fit the cuff snugly around your arm, but not too tightly. You should be able to place only one finger between the cuff and your arm. Place the cord so that it rests in the bend of your elbow. Press the power button. Sit quietly while the cuff fills with air and loses air. Write down the numbers on the screen. Wait 2-3 minutes and then repeat steps 1-10. What do the numbers mean? Two numbers make up your blood pressure. The first number is called systolic pressure. The second is called diastolic pressure. An example of a blood pressure reading is "120  over 80" (or 120/80). If you are an adult and do not have a medical condition, use this guide to find out if your blood pressure is normal: Normal First number: below 120. Second number: below 80. Elevated First number: 120-129. Second number: below 80. Hypertension stage 1 First number: 130-139. Second number: 80-89. Hypertension stage 2 First number: 140 or above. Second number:  66 or above. Your blood pressure is above normal even if only the first or only the second number is above normal. Follow these instructions at home: Medicines Take over-the-counter and prescription medicines only as told by your doctor. Tell your doctor if your medicine is causing side effects. General instructions Check your blood pressure as often as your doctor tells you to. Check your blood pressure at the same time every day. Take your monitor to your next doctor's appointment. Your doctor will: Make sure you are using it correctly. Make sure it is working right. Understand what your blood pressure numbers should be. Keep all follow-up visits. General tips You will need a blood pressure machine or monitor. Your doctor can suggest a monitor. You can buy one at a drugstore or online. When choosing one: Choose one with an arm cuff. Choose one that wraps around your upper arm. Only one finger should fit between your arm and the cuff. Do not choose one that measures your blood pressure from your wrist or finger. Where to find more information American Heart Association: www.heart.org Contact a doctor if: Your blood pressure keeps being high. Your blood pressure is suddenly low. Get help right away if: Your first blood pressure number is higher than 180. Your second blood pressure number is higher than 120. These symptoms may be an emergency. Do not wait to see if the symptoms will go away. Get help right away. Call 911. Summary Check your blood pressure at the same time every day. Avoid caffeine, alcohol, smoking, and exercise for 30 minutes before checking your blood pressure. Make sure you understand what your blood pressure numbers should be. This information is not intended to replace advice given to you by your health care provider. Make sure you discuss any questions you have with your health care provider. Document Revised: 10/25/2020 Document Reviewed: 10/25/2020 Elsevier  Patient Education  [place].

## 2022-01-02 ENCOUNTER — Inpatient Hospital Stay (HOSPITAL_BASED_OUTPATIENT_CLINIC_OR_DEPARTMENT_OTHER): Payer: Medicare Other | Admitting: Hematology & Oncology

## 2022-01-02 ENCOUNTER — Encounter: Payer: Self-pay | Admitting: Hematology & Oncology

## 2022-01-02 ENCOUNTER — Inpatient Hospital Stay: Payer: Medicare Other | Attending: Hematology & Oncology

## 2022-01-02 VITALS — BP 149/66 | HR 79 | Temp 98.0°F | Resp 20 | Ht 68.5 in | Wt 217.8 lb

## 2022-01-02 DIAGNOSIS — Z7901 Long term (current) use of anticoagulants: Secondary | ICD-10-CM | POA: Diagnosis not present

## 2022-01-02 DIAGNOSIS — I10 Essential (primary) hypertension: Secondary | ICD-10-CM | POA: Insufficient documentation

## 2022-01-02 DIAGNOSIS — D696 Thrombocytopenia, unspecified: Secondary | ICD-10-CM

## 2022-01-02 DIAGNOSIS — I482 Chronic atrial fibrillation, unspecified: Secondary | ICD-10-CM | POA: Insufficient documentation

## 2022-01-02 LAB — CBC WITH DIFFERENTIAL (CANCER CENTER ONLY)
Abs Immature Granulocytes: 0.01 10*3/uL (ref 0.00–0.07)
Basophils Absolute: 0 10*3/uL (ref 0.0–0.1)
Basophils Relative: 0 %
Eosinophils Absolute: 0.2 10*3/uL (ref 0.0–0.5)
Eosinophils Relative: 4 %
HCT: 39.9 % (ref 39.0–52.0)
Hemoglobin: 13 g/dL (ref 13.0–17.0)
Immature Granulocytes: 0 %
Lymphocytes Relative: 18 %
Lymphs Abs: 0.9 10*3/uL (ref 0.7–4.0)
MCH: 30.2 pg (ref 26.0–34.0)
MCHC: 32.6 g/dL (ref 30.0–36.0)
MCV: 92.6 fL (ref 80.0–100.0)
Monocytes Absolute: 0.5 10*3/uL (ref 0.1–1.0)
Monocytes Relative: 11 %
Neutro Abs: 3.1 10*3/uL (ref 1.7–7.7)
Neutrophils Relative %: 67 %
Platelet Count: 146 10*3/uL — ABNORMAL LOW (ref 150–400)
RBC: 4.31 MIL/uL (ref 4.22–5.81)
RDW: 14.1 % (ref 11.5–15.5)
WBC Count: 4.7 10*3/uL (ref 4.0–10.5)
nRBC: 0 % (ref 0.0–0.2)

## 2022-01-02 LAB — CMP (CANCER CENTER ONLY)
ALT: 17 U/L (ref 0–44)
AST: 16 U/L (ref 15–41)
Albumin: 4.3 g/dL (ref 3.5–5.0)
Alkaline Phosphatase: 101 U/L (ref 38–126)
Anion gap: 10 (ref 5–15)
BUN: 30 mg/dL — ABNORMAL HIGH (ref 8–23)
CO2: 25 mmol/L (ref 22–32)
Calcium: 9.4 mg/dL (ref 8.9–10.3)
Chloride: 104 mmol/L (ref 98–111)
Creatinine: 1.56 mg/dL — ABNORMAL HIGH (ref 0.61–1.24)
GFR, Estimated: 45 mL/min — ABNORMAL LOW (ref >60)
Glucose, Bld: 139 mg/dL — ABNORMAL HIGH (ref 70–99)
Potassium: 3.9 mmol/L (ref 3.5–5.1)
Sodium: 139 mmol/L (ref 135–145)
Total Bilirubin: 1 mg/dL (ref 0.3–1.2)
Total Protein: 7.2 g/dL (ref 6.5–8.1)

## 2022-01-02 LAB — SAVE SMEAR(SSMR), FOR PROVIDER SLIDE REVIEW

## 2022-01-02 NOTE — Progress Notes (Signed)
Hematology and Oncology Follow Up Visit  Todd Valentine [id number] 14-Dec-1940 81 y.o. 01/02/2022   Principle Diagnosis:  Transient thrombocytopenia  Current Therapy:   Observation     Interim History:  Todd Valentine is back for follow-up.  We first saw him back in August.  At that time, he is on quite a few medications.  I thought that the thrombocytopenia was likely from medications.  He was hospitalized at the back in September.  He had malignant hypertension.  He had bradycardia.  He was in the hospital for about 5 days.  He had his medications adjusted.  As such, his blood pressure is much better now.  His heart rate is much better now.  He is still working.  I think he works 3 days a week.  He has had no problems with bleeding.  He is on Eliquis because of chronic atrial fibrillation.  He has a good appetite.  He has had no nausea or vomiting.  There has been no chest wall pain.  He has had some leg swelling which is chronic.  There is been no change in bowel or bladder habits.  Overall, I would say that his performance status is ECOG 1.  Medications:  Current Outpatient Medications:    acetaminophen (TYLENOL) 500 MG tablet, Take 1,000 mg by mouth every 6 (six) hours as needed for mild pain, fever or headache. , Disp: , Rfl:    apixaban (ELIQUIS) 2.5 MG TABS tablet, Take 1 tablet (2.5 mg total) by mouth 2 (two) times daily., Disp: 60 tablet, Rfl: 11   carvedilol (COREG) 25 MG tablet, Take 1 tablet (25 mg total) by mouth 2 (two) times daily with a meal. (Patient taking differently: Take 12.5 mg by mouth 2 (two) times daily with a meal.), Disp: 180 tablet, Rfl: 3   doxazosin (CARDURA) 8 MG tablet, Take 1 tablet (8 mg total) by mouth at bedtime., Disp: 90 tablet, Rfl: 3   ezetimibe (ZETIA) 10 MG tablet, Take 1 tablet (10 mg total) by mouth daily., Disp: 90 tablet, Rfl: 3   famotidine (PEPCID) 20 MG tablet, Take 1 tablet (20 mg total) by mouth at bedtime., Disp: , Rfl:     FARXIGA 10 MG TABS tablet, Take 1 tablet (10 mg total) by mouth daily., Disp: 90 tablet, Rfl: 3   furosemide (LASIX) 20 MG tablet, Take 20 mg by mouth daily., Disp: , Rfl:    glipiZIDE (GLUCOTROL XL) 5 MG 24 hr tablet, Take 5 mg by mouth daily with breakfast., Disp: , Rfl:    hydrALAZINE (APRESOLINE) 100 MG tablet, Take 1 tablet (100 mg total) by mouth every 8 (eight) hours., Disp: 270 tablet, Rfl: 3   isosorbide mononitrate (IMDUR) 60 MG 24 hr tablet, Take 60 mg by mouth in the morning and 30 mg in the evening., Disp: 135 tablet, Rfl: 3   linagliptin (TRADJENTA) 5 MG TABS tablet, Take 5 mg by mouth daily., Disp: , Rfl:    losartan (COZAAR) 100 MG tablet, Take 1 tablet (100 mg total) by mouth daily., Disp: 90 tablet, Rfl: 3   magnesium oxide (MAG-OX) 400 (241.3 Mg) MG tablet, Take 1 tablet by mouth daily., Disp: , Rfl:    Potassium Chloride ER 20 MEQ TBCR, Take 1 tablet by mouth once daily, Disp: 90 tablet, Rfl: 0   tamsulosin (FLOMAX) 0.4 MG CAPS capsule, Take 0.4 mg by mouth daily. , Disp: , Rfl:    traZODone (DESYREL) 50 MG tablet, Take 50 mg by mouth  at bedtime as needed for sleep., Disp: , Rfl:    nitroGLYCERIN (NITROSTAT) 0.4 MG SL tablet, Place 1 tablet (0.4 mg total) under the tongue every 5 (five) minutes as needed for chest pain. (Patient not taking: Reported on 01/02/2022), Disp: 25 tablet, Rfl: 3   rosuvastatin (CRESTOR) 5 MG tablet, Take 1 tablet (5 mg total) by mouth daily. (Patient not taking: Reported on 01/02/2022), Disp: 90 tablet, Rfl: 3  Allergies:  Allergies  Allergen Reactions   Hydrocodone-Acetaminophen Other (See Comments)   Amlodipine Swelling    Leg swelling on 10 mg   Aspirin Other (See Comments)    Contraindicated - Kidney disease Other reaction(s): Other (See Comments), Other (See Comments) Contraindicated - Kidney disease Contraindicated - Kidney disease   Ciprofloxacin Other (See Comments)    Other reaction(s): Pt states he could hardly move Other reaction(s):  Other (See Comments), Other (See Comments) Pt states he could hardly move Other reaction(s): Other (See Comments) Pt states he could hardly move Other reaction(s): Pt states he could hardly move Pt states he could hardly move   Sitagliptin Other (See Comments)    Other reaction(s): Weakness  Other reaction(s): Other (See Comments), Other (See Comments) Weakness Other reaction(s): Other (See Comments) Weakness Other reaction(s): Weakness Weakness    Past Medical History, Surgical history, Social history, and Family History were reviewed and updated.  Review of Systems: Review of Systems  Constitutional: Negative.   HENT:  Negative.    Eyes: Negative.   Respiratory: Negative.    Cardiovascular:  Positive for leg swelling.  Gastrointestinal: Negative.   Endocrine: Negative.   Genitourinary: Negative.    Musculoskeletal: Negative.   Skin: Negative.   Neurological: Negative.   Hematological: Negative.   Psychiatric/Behavioral: Negative.      Physical Exam:  height is 5' 8.5" (1.74 m) and weight is 217 lb 12.8 oz (98.8 kg). His oral temperature is 98 F (36.7 C). His blood pressure is 149/66 (abnormal) and his pulse is 79. His respiration is 20 and oxygen saturation is 98%.   Wt Readings from Last 3 Encounters:  01/02/22 217 lb 12.8 oz (98.8 kg)  12/27/21 225 lb 6 oz (102.2 kg)  12/03/21 218 lb 9.6 oz (99.2 kg)    Physical Exam Vitals reviewed.  HENT:     Head: Normocephalic and atraumatic.  Eyes:     Pupils: Pupils are equal, round, and reactive to light.  Cardiovascular:     Rate and Rhythm: Normal rate and regular rhythm.     Heart sounds: Normal heart sounds.     Comments: Cardiac exam is regular rate and irregular rhythm consistent with atrial fibrillation.  He has a 3/6 systolic ejection murmur. Pulmonary:     Effort: Pulmonary effort is normal.     Breath sounds: Normal breath sounds.  Abdominal:     General: Bowel sounds are normal.     Palpations:  Abdomen is soft.  Musculoskeletal:        General: No tenderness or deformity. Normal range of motion.     Cervical back: Normal range of motion.     Comments: Extremities shows 2+ edema in his lower legs.  He does have some compression stockings on.  Lymphadenopathy:     Cervical: No cervical adenopathy.  Skin:    General: Skin is warm and dry.     Findings: No erythema or rash.  Neurological:     Mental Status: He is alert and oriented to person, place, and time.  Psychiatric:  Behavior: Behavior normal.        Thought Content: Thought content normal.        Judgment: Judgment normal.      Lab Results  Component Value Date   WBC 4.7 01/02/2022   HGB 13.0 01/02/2022   HCT 39.9 01/02/2022   MCV 92.6 01/02/2022   PLT 146 (L) 01/02/2022     Chemistry      Component Value Date/Time   NA 139 01/02/2022 1515   NA 143 08/07/2014 0838   NA 134 (L) 10/06/2013 0728   K 3.9 01/02/2022 1515   K 3.2 (L) 10/06/2013 0728   CL 104 01/02/2022 1515   CL 99 10/06/2013 0728   CO2 25 01/02/2022 1515   CO2 29 10/06/2013 0728   BUN 30 (H) 01/02/2022 1515   BUN 20 08/07/2014 0838   BUN 30 (H) 10/06/2013 0728   CREATININE 1.56 (H) 01/02/2022 1515   CREATININE 1.56 (H) 10/06/2013 0728      Component Value Date/Time   CALCIUM 9.4 01/02/2022 1515   CALCIUM 8.6 10/06/2013 0728   ALKPHOS 101 01/02/2022 1515   ALKPHOS 63 10/03/2013 0359   AST 16 01/02/2022 1515   ALT 17 01/02/2022 1515   ALT 62 10/03/2013 0359   BILITOT 1.0 01/02/2022 1515       Impression and Plan: Todd Valentine is a very nice 81 year old white male.  He had transient thrombocytopenia.  When we first saw him, the thrombocytopenia really was not even all that bad.  I thought that the thrombocytopenia probably was from all of his medications.  The fact that he had his medications adjusted and his platelet count is better is certainly I think an indicator that his platelet count is we receptive to  medications.  I really do not see anything that looks like myelodysplasia although he is 81 years old.  He does not need a bone marrow test.  I think we can just watch him.  Because his platelet count is better, I think we get him back in about 6 months now.  If his platelet count is holding steady at that time in the future, then I think we can probably let him go from the clinic.   Volanda Napoleon, MD 11/9/20234:07 PM

## 2022-01-15 ENCOUNTER — Encounter: Payer: Self-pay | Admitting: Cardiovascular Disease

## 2022-01-28 ENCOUNTER — Telehealth: Payer: Self-pay | Admitting: Cardiovascular Disease

## 2022-01-28 NOTE — Telephone Encounter (Signed)
Pt c/o medication issue:  1. Name of Medication: Zetia   2. How are you currently taking this medication (dosage and times per day)?   3. Are you having a reaction (difficulty breathing--STAT)?   4. What is your medication issue? Per MyChart message, pt's daughter states that she was told to call if this medication does not work and she would like to go over the other medications and options that were offered and they type of insurance pt would need for each.    She would also like to ask questions in regards to the test pt has coming up on 12/08

## 2022-01-29 NOTE — Telephone Encounter (Signed)
Spoke with patients daughter per release form. She states that provider had patient do a holiday from the rosuvastatin. She states that he is much better. He is continuing his ezetimibe and then we reviewed other medications such as Praluent or Repatha. She is enrolling him for his insurance. Advised that I would update provider on relief he received when he stopped medication and I would forward to Dr. Rockey Situ if he should have any further recommendations at this time.

## 2022-01-29 NOTE — Telephone Encounter (Signed)
Spoke with patients daughter and she reports he was previously on atorvastatin the people at rehab stopped it due to affecting his muscles a couple of years ago. He was then put on Zetia, then rosuvastatin. Daughter states they would like to wait until his next appointment with Dr. Rockey Situ to decide on next plan. She verbalized understanding of our conversation with no further questions at this time.     Minna Merritts, MD  P [facility] Triage Caller: Unspecified (Yesterday, 12:24 PM) If unable to tolerate Crestor, would consider trying alternate statin, atorvastatin/Lipitor 10 mg daily If side effects on atorvastatin, next option would be injection every 2 weeks with Repatha Thx TGollan

## 2022-01-31 ENCOUNTER — Ambulatory Visit: Payer: Medicare Other | Attending: Cardiovascular Disease

## 2022-01-31 ENCOUNTER — Telehealth: Payer: Self-pay | Admitting: Cardiovascular Disease

## 2022-01-31 DIAGNOSIS — I739 Peripheral vascular disease, unspecified: Secondary | ICD-10-CM | POA: Insufficient documentation

## 2022-01-31 NOTE — Telephone Encounter (Signed)
Per Dr. Tyrell Antonio nurse, pt do not need appointment with Dr. Fletcher Anon as pt is followed by [facility]. Daughter made aware and appointment cancelled.

## 2022-01-31 NOTE — Telephone Encounter (Signed)
Pt daughter calling because an appt with Dr. Fletcher Anon was made following pt's ultrasound of his legs. Pt is also seeing [facility] on 02/14/22, she wants to know if pt needs to see both

## 2022-02-13 ENCOUNTER — Ambulatory Visit: Payer: Medicare Other | Admitting: Cardiovascular Disease

## 2022-02-14 ENCOUNTER — Encounter (INDEPENDENT_AMBULATORY_CARE_PROVIDER_SITE_OTHER): Payer: Self-pay | Admitting: Vascular Surgery

## 2022-02-14 ENCOUNTER — Ambulatory Visit (INDEPENDENT_AMBULATORY_CARE_PROVIDER_SITE_OTHER): Payer: Medicare Other | Admitting: Nurse Practitioner

## 2022-02-14 VITALS — BP 160/81 | HR 76 | Resp 16 | Wt 217.4 lb

## 2022-02-14 DIAGNOSIS — E118 Type 2 diabetes mellitus with unspecified complications: Secondary | ICD-10-CM | POA: Diagnosis not present

## 2022-02-14 DIAGNOSIS — I739 Peripheral vascular disease, unspecified: Secondary | ICD-10-CM | POA: Diagnosis not present

## 2022-02-14 DIAGNOSIS — I701 Atherosclerosis of renal artery: Secondary | ICD-10-CM

## 2022-02-14 NOTE — Progress Notes (Signed)
Subjective:    Patient ID: Todd Valentine, male    DOB: 1941-02-02, 81 y.o.   MRN: [id number] Chief Complaint  Patient presents with   Follow-up    2 month follow up    Todd Valentine is an 81 year old male who presents today following renal artery angiogram on 11/13/2021.  This angiogram was done after noninvasive studies done in the hospital suggested significant stenosis of his renal arteries.  The patient was originally admitted for hypertensive urgency.  Following angiogram it was noted that there was no significant stenosis in the bilateral renal arteries.  At that time the patient was also having some significant claudication-like symptoms.  However he and the daughter notes that he was currently on statins at that time and he has since stopped his statins.  Since that time his claudication symptoms have gone away and he notes that he is more active than he has been in years because of this.  He recently had noninvasive studies done by his cardiologist which show an ABI of 0.84 on the right and 0.81 on the left.  There is a 0.44 TBI on the right and 0.38 on the left.  Arterial duplex shows primarily biphasic/triphasic waveforms until the popliteal artery where it transitions to monophasic waveforms in the right.  There is a 50 to 74% stenosis noted in the anterior tibial artery.  The patient also has noted primarily biphasic/triphasic waveforms until the distal popliteal artery on the left.  There is noted 30 to 49% stenosis in the SFA as well as profunda.    Review of Systems  Musculoskeletal:  Positive for myalgias.  All other systems reviewed and are negative.      Objective:   Physical Exam Vitals reviewed.  HENT:     Head: Normocephalic.  Cardiovascular:     Rate and Rhythm: Normal rate.     Pulses:          Dorsalis pedis pulses are 1+ on the right side and 1+ on the left side.  Pulmonary:     Effort: Pulmonary effort is normal.  Skin:    General: Skin is warm and dry.   Neurological:     Mental Status: He is alert and oriented to person, place, and time.  Psychiatric:        Mood and Affect: Mood normal.        Behavior: Behavior normal.        Thought Content: Thought content normal.        Judgment: Judgment normal.     BP (!) 160/81 (BP Location: Left Arm)   Pulse 76   Resp 16   Wt 217 lb 6.4 oz (98.6 kg)   BMI 32.57 kg/m   Past Medical History:  Diagnosis Date   Acute renal failure ([name])    BPH (benign prostatic hyperplasia)    Chronic atrial fibrillation ([name])    Chronic diastolic CHF (congestive heart failure) ([name])    a. echo as above   Chronic insomnia    Coronary artery disease, non-occlusive 10/06/2013   a. cath 09/2013: pLAD 40, CTO Diag, ramus 40-->80, OM 31 mRCA 40, med rx   Depression    Diabetes mellitus with complication ([name])    Diverticulitis of colon    Dysrhythmia    atrial fibrillation   Heart murmur    systolic murmur, unspecified   History of diverticulitis    Hypercholesterolemia    Hyperkalemia    Hyperlipidemia    Hyperlipidemia  Hypertension    Hypokalemia    Hypomagnesemia    Malignant melanoma ([name])    Melanoma ([name])    Mitral regurgitation    a. echo 09/2013: EF 55-60%, mildly dilated left atrrium, mild to moderate MR/TR   Sleep apnea in adult     Social History   Socioeconomic History   Marital status: Widowed    Spouse name: Not on file   Number of children: Not on file   Years of education: Not on file   Highest education level: Not on file  Occupational History   Not on file  Tobacco Use   Smoking status: Former    Packs/day: 3.00    Years: 30.00    Total pack years: 90.00    Types: Cigarettes    Quit date: 70    Years since quitting: 35.0   Smokeless tobacco: Never  Vaping Use   Vaping Use: Never used  Substance and Sexual Activity   Alcohol use: Yes    Comment: "occassionally have a few drinks"   Drug use: Not Currently   Sexual activity: Not Currently  Other Topics  Concern   Not on file  Social History Narrative   Not on file   Social Determinants of Health   Financial Resource Strain: Not on file  Food Insecurity: Unknown (11/07/2021)   Hunger Vital Sign    Worried About Running Out of Food in the Last Year: Patient refused    [place] in the Last Year: Patient refused  Transportation Needs: Unknown (11/07/2021)   PRAPARE - Hydrologist (Medical): Patient refused    Lack of Transportation (Non-Medical): Patient refused  Physical Activity: Not on file  Stress: Not on file  Social Connections: Not on file  Intimate Partner Violence: Unknown (11/07/2021)   Humiliation, Afraid, Rape, and Kick questionnaire    Fear of Current or Ex-Partner: Patient refused    Emotionally Abused: Patient refused    Physically Abused: Patient refused    Sexually Abused: Patient refused    Past Surgical History:  Procedure Laterality Date   APPENDECTOMY     CARDIAC CATHETERIZATION  10/06/2013   COLONOSCOPY     COLONOSCOPY WITH PROPOFOL N/A 11/23/2014   Procedure: COLONOSCOPY WITH PROPOFOL;  Surgeon: Manya Silvas, MD;  Location: [facility] ENDOSCOPY;  Service: Endoscopy;  Laterality: N/A;   COLONOSCOPY WITH PROPOFOL N/A 04/11/2020   Procedure: COLONOSCOPY WITH PROPOFOL;  Surgeon: Toledo, Benay Pike, MD;  Location: [facility] ENDOSCOPY;  Service: Gastroenterology;  Laterality: N/A;  DM  ELIQUIS   MOHS SURGERY     removal tubular adenoma     RENAL ANGIOGRAPHY Right 11/13/2021   Procedure: RENAL ANGIOGRAPHY;  Surgeon: Algernon Huxley, MD;  Location: [facility];  Service: Cardiovascular;  Laterality: Right;    Family History  Family history unknown: Yes    Allergies  Allergen Reactions   Hydrocodone-Acetaminophen Other (See Comments)   Amlodipine Swelling    Leg swelling on 10 mg   Aspirin Other (See Comments)    Contraindicated - Kidney disease Other reaction(s): Other (See Comments), Other (See Comments) Contraindicated -  Kidney disease Contraindicated - Kidney disease   Ciprofloxacin Other (See Comments)    Other reaction(s): Pt states he could hardly move Other reaction(s): Other (See Comments), Other (See Comments) Pt states he could hardly move Other reaction(s): Other (See Comments) Pt states he could hardly move Other reaction(s): Pt states he could hardly move Pt states he could  hardly move   Sitagliptin Other (See Comments)    Other reaction(s): Weakness  Other reaction(s): Other (See Comments), Other (See Comments) Weakness Other reaction(s): Other (See Comments) Weakness Other reaction(s): Weakness Weakness       Latest Ref Rng & Units 01/02/2022    3:15 PM 11/08/2021    6:27 AM 11/07/2021    3:01 PM  CBC  WBC 4.0 - 10.5 K/uL 4.7  7.9  6.6   Hemoglobin 13.0 - 17.0 g/dL 13.0  15.3  15.3   Hematocrit 39.0 - 52.0 % 39.9  47.6  46.6   Platelets 150 - 400 K/uL 146  113  143       CMP     Component Value Date/Time   NA 139 01/02/2022 1515   NA 143 08/07/2014 0838   NA 134 (L) 10/06/2013 0728   K 3.9 01/02/2022 1515   K 3.2 (L) 10/06/2013 0728   CL 104 01/02/2022 1515   CL 99 10/06/2013 0728   CO2 25 01/02/2022 1515   CO2 29 10/06/2013 0728   GLUCOSE 139 (H) 01/02/2022 1515   GLUCOSE 161 (H) 10/06/2013 0728   BUN 30 (H) 01/02/2022 1515   BUN 20 08/07/2014 0838   BUN 30 (H) 10/06/2013 0728   CREATININE 1.56 (H) 01/02/2022 1515   CREATININE 1.56 (H) 10/06/2013 0728   CALCIUM 9.4 01/02/2022 1515   CALCIUM 8.6 10/06/2013 0728   PROT 7.2 01/02/2022 1515   PROT 6.8 08/25/2017 0841   PROT 7.0 10/03/2013 0359   ALBUMIN 4.3 01/02/2022 1515   ALBUMIN 4.2 08/25/2017 0841   ALBUMIN 3.2 (L) 10/03/2013 0359   AST 16 01/02/2022 1515   ALT 17 01/02/2022 1515   ALT 62 10/03/2013 0359   ALKPHOS 101 01/02/2022 1515   ALKPHOS 63 10/03/2013 0359   BILITOT 1.0 01/02/2022 1515   GFRNONAA 45 (L) 01/02/2022 1515   GFRNONAA 44 (L) 10/06/2013 0728   GFRAA 49 (L) 09/29/2018 0328   GFRAA 51  (L) 10/06/2013 0728     No results found.     Assessment & Plan:   1. Renal artery stenosis ([name]) Angiogram noted there was only about a 30 to 40% stenosis in the mid right renal artery.  The left renal artery did not appear to have any significant stenosis.  Based on this do not recommend further intervention.  We will however reevaluate with renal artery duplex in 6 months.  Patient will continue to follow with cardiologist and PCP for treatment of hypertension.  2. Diabetes mellitus type 2 with complications ([place]) Continue hypoglycemic medications as already ordered, these medications have been reviewed and there are no changes at this time.  Hgb A1C to be monitored as already arranged by primary service  3. PAD (peripheral artery disease) ([place]) The patient does have notable evidence of peripheral arterial disease based on his noninvasive studies however since stopping his statins he has noticed that his claudication symptoms have drastically improved and become nearly nonexistent.  Because he does not have any other symptoms such as rest pain or ulcerations, I do not recommend intervention for the patient at this time.  I would recommend however that he continue to walk and be as active as possible to encourage the creation of collateral vessels.  We will maintain follow-up in approximately 6 months or sooner if his symptoms suddenly worsen.  We also discussed about possibly beginning more injectable medications for hypertension as the patient and daughter were concerned.  Given the patient's multiple  atherosclerotic risk factors and has not do believe that it would be in his best restriction going forward if he is able to tolerate this medication.  He will continue to follow treatment with his cardiologist.   Current Outpatient Medications on File Prior to Visit  Medication Sig Dispense Refill   acetaminophen (TYLENOL) 500 MG tablet Take 1,000 mg by mouth every 6 (six) hours as needed for  mild pain, fever or headache.      apixaban (ELIQUIS) 2.5 MG TABS tablet Take 1 tablet (2.5 mg total) by mouth 2 (two) times daily. 60 tablet 11   doxazosin (CARDURA) 8 MG tablet Take 1 tablet (8 mg total) by mouth at bedtime. 90 tablet 3   ezetimibe (ZETIA) 10 MG tablet Take 1 tablet (10 mg total) by mouth daily. 90 tablet 3   famotidine (PEPCID) 20 MG tablet Take 1 tablet (20 mg total) by mouth at bedtime.     FARXIGA 10 MG TABS tablet Take 1 tablet (10 mg total) by mouth daily. 90 tablet 3   furosemide (LASIX) 20 MG tablet Take 20 mg by mouth daily.     glipiZIDE (GLUCOTROL XL) 5 MG 24 hr tablet Take 5 mg by mouth daily with breakfast.     hydrALAZINE (APRESOLINE) 100 MG tablet Take 1 tablet (100 mg total) by mouth every 8 (eight) hours. 270 tablet 3   linagliptin (TRADJENTA) 5 MG TABS tablet Take 5 mg by mouth daily.     losartan (COZAAR) 100 MG tablet Take 1 tablet (100 mg total) by mouth daily. 90 tablet 3   magnesium oxide (MAG-OX) 400 (241.3 Mg) MG tablet Take 1 tablet by mouth daily.     Potassium Chloride ER 20 MEQ TBCR Take 1 tablet by mouth once daily 90 tablet 0   tamsulosin (FLOMAX) 0.4 MG CAPS capsule Take 0.4 mg by mouth daily.      traZODone (DESYREL) 50 MG tablet Take 50 mg by mouth at bedtime as needed for sleep.     carvedilol (COREG) 25 MG tablet Take 1 tablet (25 mg total) by mouth 2 (two) times daily with a meal. (Patient taking differently: Take 12.5 mg by mouth 2 (two) times daily with a meal.) 180 tablet 3   nitroGLYCERIN (NITROSTAT) 0.4 MG SL tablet Place 1 tablet (0.4 mg total) under the tongue every 5 (five) minutes as needed for chest pain. (Patient not taking: Reported on 01/02/2022) 25 tablet 3   rosuvastatin (CRESTOR) 5 MG tablet Take 1 tablet (5 mg total) by mouth daily. (Patient not taking: Reported on 01/02/2022) 90 tablet 3   No current facility-administered medications on file prior to visit.    There are no Patient Instructions on file for this visit. No  follow-ups on file.   Kris Hartmann, NP

## 2022-02-20 ENCOUNTER — Telehealth: Payer: Self-pay | Admitting: Cardiovascular Disease

## 2022-02-20 MED ORDER — ISOSORBIDE MONONITRATE ER 60 MG PO TB24: ORAL_TABLET | ORAL | 3 refills | Status: DC

## 2022-02-20 NOTE — Telephone Encounter (Signed)
Requested Prescriptions   Signed Prescriptions Disp Refills   isosorbide mononitrate (IMDUR) 60 MG 24 hr tablet 45 tablet 3    Sig: Take 60 mg by mouth in the morning and 30 mg in the evening.    Authorizing Provider: Minna Merritts    Ordering User: Raelene Bott, Jakayla Schweppe L

## 2022-02-20 NOTE — Telephone Encounter (Signed)
*  STAT* If patient is at the pharmacy, call can be transferred to refill team.   1. Which medications need to be refilled? (please list name of each medication and dose if known)  isosorbide mononitrate (IMDUR) 60 MG 24 hr tablet  2. Which pharmacy/location (including street and city if local pharmacy) is medication to be sent to? [facility], [place]  3. Do they need a 30 day or 90 day supply? 23  Pt's daughter states that since this med has been moved to taking twice daily, they have run out of the prescription and need a new RX sent in with new instructions.

## 2022-02-25 ENCOUNTER — Encounter (INDEPENDENT_AMBULATORY_CARE_PROVIDER_SITE_OTHER): Payer: Self-pay | Admitting: Nurse Practitioner

## 2022-03-03 ENCOUNTER — Encounter: Payer: Self-pay | Admitting: Cardiovascular Disease

## 2022-03-07 ENCOUNTER — Telehealth: Payer: Self-pay | Admitting: Cardiovascular Disease

## 2022-03-07 NOTE — Telephone Encounter (Signed)
Returned the call to the patient's daughter. The daughter has been made aware that Praluent can be discussed at the appointment on 1/15 with Dr. Rockey Situ. She would like for the patient to have fasting labs before Praluent is started. She will disucss this with Dr. Rockey Situ on Monday 1/15

## 2022-03-07 NOTE — Telephone Encounter (Signed)
Daughter is calling in but the 2nd part of the mychart message didn't get answer. Calling with question about the Praluent. Please advise

## 2022-03-09 NOTE — Progress Notes (Unsigned)
Cardiology Office Note  Date:  03/10/2022   ID:  Todd [facility], DOB August 13, 1940, MRN [id number]  PCP:  [facility]   Chief Complaint  Patient presents with   3 month follow up     "Doing well." Medications reviewed by the patient verbally.     HPI:  [name] is a 82 year old gentleman with  smoking history for 30-40 years,  Prior alcohol use,  obesity,  chronic diastolic CHF,Echo March 0947 with ejection fraction 50-55% chronic atrial fibrillation,  hypertension, late type 2 diabetes,  Significant coronary disease by catheterization August 2015 three-vessel  sleep apnea, uses his CPAP periodically presenting for follow-up of his chronic atrial fibrillation, diastolic CHF, hypertension  Last seen by myself in clinic December 27, 2021 December 19, 2021 for leg swelling while at the beach Was taking extra Lasix Of note amlodipine started on recent hospitalization Swimmy head,  better by spreading out meds, Am and PM claudication sx on long ambulation, relieved by resting 1 to 2 minutes, improved by holding statin  Seen by vascular 30 to 40% stenosis in the mid right renal artery.   Medication recommendations as below were made Hold coreg for rate <55 Lasix 20 mg daily with extra lasix 20 mg at lunch as needed for leg swelling  Lab work reviewed Hemoglobin A1c 6.6 Normal CBC Creatinine 1.62 BUN 30 GFR 43  In follow-up today reports feeling relatively well, working 3 days a week Leg swelling dramatically improved Feels better off amlodipine, leg pain better off Crestor Able to ambulate without significant symptoms  Curious to see what his cholesterol is before starting PCSK9 inhibitor  Current medication regimen as below A.m. Hydralazine 100 every Isosorbide 60 in the morning Carvedilol 12.5 in the PM Losartan 100 daily Lasix 20 mg in AM   3 pm:  hydralazine 100 mg   Pm Doxazosin 8 mg Isosorbide 30 in the evening Coreg 12.5 in the PM    Bedtime:  Hydralazine 100 mg  Isosorbide 30 mg before bed   EKG personally reviewed by myself on todays visit Atrial fibrillation right bundle branch block rate 70  bpm  Other past medical history reviewed Hospitalized September 2023 for bradycardia, shortness of breath Clonidine diltiazem carvedilol held initially though lower dose carvedilol was reinstated close to discharge given runs of tachycardia Coreg 12.5 twice daily reinitiated  Currently reports taking 17 medications in the morning, has some lightheadedness Thinks he is overmedicated in the morning Previously was taking several of his blood pressure medications in the evening and had no symptoms  Continues to have leg weakness, thinks he might go back to the gym Reports having some pain in his legs with ambulation Concerned about claudication symptoms Concerned as he has worsening leg edema  Lives alone with supportive family after wife unexpectedly passed away 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 Declined ventilator if needed acute kidney injury/ATN,  requiring CRRT Diltiazem dose increased at d/c up to 240 daily D/c on pradaxa (was going to change to eliquis) Off lisinopril/HCTZ, metformin  Labs at d/c  CR 1.64, BUN 33  total cholesterol well controlled, 130 or less    hospital admission 10/02/2013 with discharge August 12. He presented with shortness of breath, Atrial fibrillation with RVR. Echocardiogram showed normal ejection fraction. Symptoms improved with diuresis.  Echocardiogram 10/02/2013 showing ejection fraction 55-60%, mildly dilated left atrium, mild to moderate MR and TR   He had cardiac catheterization 10/06/2013 showing occluded diagonal vessel, 40% proximal LAD disease, bifurcating marginal  branch/ramus. Ramus had 60% followed by 80% disease, OM with 50% mid disease at least. RCA with 40% mid disease. Medical management recommended  PMH:   has a past medical history of Acute renal failure ([place]), BPH (benign prostatic hyperplasia), Chronic atrial fibrillation ([name]), Chronic diastolic CHF (congestive heart failure) ([place]), Chronic insomnia, Coronary artery disease, non-occlusive (10/06/2013), Depression, Diabetes mellitus with complication ([place]), Diverticulitis of colon, Dysrhythmia, Heart murmur, History of diverticulitis, Hypercholesterolemia, Hyperkalemia, Hyperlipidemia, Hyperlipidemia, Hypertension, Hypokalemia, Hypomagnesemia, Malignant melanoma ([place]), Melanoma ([place]), Mitral regurgitation, and Sleep apnea in adult.  PSH:    Past Surgical History:  Procedure Laterality Date   APPENDECTOMY     CARDIAC CATHETERIZATION  10/06/2013   COLONOSCOPY     COLONOSCOPY WITH PROPOFOL N/A 11/23/2014   Procedure: COLONOSCOPY WITH PROPOFOL;  Surgeon: Manya Silvas, MD;  Location: [facility] ENDOSCOPY;  Service: Endoscopy;  Laterality: N/A;   COLONOSCOPY WITH PROPOFOL N/A 04/11/2020   Procedure: COLONOSCOPY WITH PROPOFOL;  Surgeon: Toledo, Benay Pike, MD;  Location: [facility] ENDOSCOPY;  Service: Gastroenterology;  Laterality: N/A;  DM  ELIQUIS   MOHS SURGERY     removal tubular adenoma     RENAL ANGIOGRAPHY Right 11/13/2021   Procedure: RENAL ANGIOGRAPHY;  Surgeon: Algernon Huxley, MD;  Location: [facility] LAB;  Service: Cardiovascular;  Laterality: Right;    Current Outpatient Medications on File Prior to Visit  Medication Sig Dispense Refill   acetaminophen (TYLENOL) 500 MG tablet Take 1,000 mg by mouth every 6 (six) hours as needed for mild pain, fever or headache.      apixaban (ELIQUIS) 2.5 MG TABS tablet Take 1 tablet (2.5 mg total) by mouth 2 (two) times daily. 60 tablet 11   carvedilol (COREG) 25 MG tablet Take 12.5 mg by mouth 2 (two) times daily with a meal.     doxazosin (CARDURA) 8 MG  tablet Take 1 tablet (8 mg total) by mouth at bedtime. 90 tablet 3   ezetimibe (ZETIA) 10 MG tablet Take 1 tablet (10 mg total) by mouth daily. 90 tablet 3   famotidine (PEPCID) 20 MG tablet Take 1 tablet (20 mg total) by mouth at bedtime.     FARXIGA 10 MG TABS tablet Take 1 tablet (10 mg total) by mouth daily. 90 tablet 3   furosemide (LASIX) 20 MG tablet Take 20 mg by mouth daily.     glipiZIDE (GLUCOTROL XL) 5 MG 24 hr tablet Take 5 mg by mouth daily with breakfast.     hydrALAZINE (APRESOLINE) 100 MG tablet Take 1 tablet (100 mg total) by mouth every 8 (eight) hours. 270 tablet 3   isosorbide mononitrate (IMDUR) 60 MG 24 hr tablet Take 60 mg one tablet in the am, one-half tablet (30 mg) in the pm and one-half tablet (30 mg) at bedtime.     linagliptin (TRADJENTA) 5 MG TABS tablet Take 5 mg by mouth daily.     losartan (COZAAR) 100 MG tablet Take 1 tablet (100 mg total) by mouth daily. 90 tablet 3   magnesium oxide (MAG-OX) 400 (241.3 Mg) MG tablet Take 1 tablet by mouth daily.     nitroGLYCERIN (NITROSTAT) 0.4 MG SL tablet Place 1 tablet (0.4 mg total) under the tongue every 5 (five) minutes as needed for chest pain. 25 tablet 3   Potassium  Chloride ER 20 MEQ TBCR Take 1 tablet by mouth once daily 90 tablet 0   tamsulosin (FLOMAX) 0.4 MG CAPS capsule Take 0.4 mg by mouth daily.      traZODone (DESYREL) 50 MG tablet Take 50 mg by mouth at bedtime as needed for sleep.     No current facility-administered medications on file prior to visit.    Allergies:   Hydrocodone-acetaminophen, Amlodipine, Aspirin, Ciprofloxacin, and Sitagliptin   Social History:  The patient  reports that he quit smoking about 35 years ago. His smoking use included cigarettes. He has a 90.00 pack-year smoking history. He has never used smokeless tobacco. He reports current alcohol use. He reports that he does not currently use drugs.   Family History:   Family history is unknown by patient.    Review of  Systems: Review of Systems  Constitutional: Negative.   HENT: Negative.    Respiratory: Negative.    Cardiovascular: Negative.   Gastrointestinal: Negative.   Musculoskeletal: Negative.        Gait instability  Neurological: Negative.   Psychiatric/Behavioral: Negative.    All other systems reviewed and are negative.   PHYSICAL EXAM: VS:  BP 120/62 (BP Location: Left Arm, Patient Position: Sitting, Cuff Size: Normal)   Pulse 80   Ht 5' 8.5" (1.74 m)   Wt 216 lb 8 oz (98.2 kg)   SpO2 97%   BMI 32.44 kg/m  , BMI Body mass index is 32.44 kg/m. Constitutional:  oriented to person, place, and time. No distress.  HENT:  Head: Grossly normal Eyes:  no discharge. No scleral icterus.  Neck: No JVD, no carotid bruits  Cardiovascular: Regular rate and rhythm, no murmurs appreciated Pulmonary/Chest: Clear to auscultation bilaterally, no wheezes or rails Abdominal: Soft.  no distension.  no tenderness.  Musculoskeletal: Normal range of motion Neurological:  normal muscle tone. Coordination normal. No atrophy Skin: Skin warm and dry Psychiatric: normal affect, pleasant  Recent Labs: 11/08/2021: Magnesium 2.1; TSH 1.921 01/02/2022: ALT 17; BUN 30; Creatinine 1.56; Hemoglobin 13.0; Platelet Count 146; Potassium 3.9; Sodium 139    Lipid Panel Lab Results  Component Value Date   CHOL 97 (L) 08/25/2017   HDL 30 (L) 08/25/2017   LDLCALC 41 08/25/2017   TRIG 128 08/25/2017    Wt Readings from Last 3 Encounters:  03/10/22 216 lb 8 oz (98.2 kg)  02/14/22 217 lb 6.4 oz (98.6 kg)  01/02/22 217 lb 12.8 oz (98.8 kg)     ASSESSMENT AND PLAN:  Permanent atrial fibrillation ([name]) - Plan: EKG 12-Lead Tachybradycardia syndrome hospitalized for symptomatic bradycardia September 2023 Reports he is tolerating carvedilol 12.5 twice daily A-fib rate well-controlled Tolerating Eliquis 2.5 twice daily, given age over 45 renal dysfunction  Essential hypertension Amlodipine held for leg  swelling Medication regimen as below A.m. Hydralazine 100 every 8hr (3 pm) Isosorbide 60 in the morning Carvedilol 12.5 in the PM Losartan 100 daily Lasix 20 mg  in AM  Pm Doxazosin 8 mg Isosorbide 30 in the evening,  Coreg 12.5 Tamsulosin  Bedtime:  Hydralazine 100 mg  Isosorbide 30 mg before bed  chronic diastolic CHF (congestive heart failure) ([name]) - Continue Lasix 20 daily, appears euvolemic Labile blood pressure but in general much improved  Mixed hyperlipidemia  Statin myalgia, will repeat lipid panel before starting Praluent  Type 2 diabetes mellitus with complication, without long-term current use of insulin ([name]) - A1c stable, 6.6  Acute on chronic renal failure Continue Lasix 20 daily, creatinine 1.6  Leg pain Better by holding amloidpine and crestor  CAD with stable angina Currently with no symptoms of angina. No further workup at this time. Continue current medication regimen.  Discussed cholesterol goals    Total encounter time more than 30 minutes  Greater than 50% was spent in counseling and coordination of care with the patient     No orders of the defined types were placed in this encounter.     Signed, Esmond Plants, M.D., Ph.D. 03/10/2022  [place], [place]

## 2022-03-10 ENCOUNTER — Ambulatory Visit: Payer: Medicare Other

## 2022-03-10 ENCOUNTER — Encounter: Payer: Self-pay | Admitting: Cardiovascular Disease

## 2022-03-10 ENCOUNTER — Ambulatory Visit: Payer: Medicare Other | Attending: Cardiovascular Disease | Admitting: Cardiovascular Disease

## 2022-03-10 VITALS — BP 120/62 | HR 80 | Ht 68.5 in | Wt 216.5 lb

## 2022-03-10 DIAGNOSIS — I4892 Unspecified atrial flutter: Secondary | ICD-10-CM | POA: Insufficient documentation

## 2022-03-10 DIAGNOSIS — K551 Chronic vascular disorders of intestine: Secondary | ICD-10-CM

## 2022-03-10 DIAGNOSIS — I25118 Atherosclerotic heart disease of native coronary artery with other forms of angina pectoris: Secondary | ICD-10-CM

## 2022-03-10 DIAGNOSIS — I739 Peripheral vascular disease, unspecified: Secondary | ICD-10-CM | POA: Diagnosis present

## 2022-03-10 DIAGNOSIS — R0602 Shortness of breath: Secondary | ICD-10-CM | POA: Diagnosis present

## 2022-03-10 DIAGNOSIS — E118 Type 2 diabetes mellitus with unspecified complications: Secondary | ICD-10-CM | POA: Diagnosis not present

## 2022-03-10 DIAGNOSIS — E782 Mixed hyperlipidemia: Secondary | ICD-10-CM

## 2022-03-10 DIAGNOSIS — T466X5A Adverse effect of antihyperlipidemic and antiarteriosclerotic drugs, initial encounter: Secondary | ICD-10-CM | POA: Diagnosis present

## 2022-03-10 DIAGNOSIS — I701 Atherosclerosis of renal artery: Secondary | ICD-10-CM | POA: Diagnosis not present

## 2022-03-10 DIAGNOSIS — M791 Myalgia, unspecified site: Secondary | ICD-10-CM | POA: Insufficient documentation

## 2022-03-10 DIAGNOSIS — I10 Essential (primary) hypertension: Secondary | ICD-10-CM | POA: Diagnosis not present

## 2022-03-10 DIAGNOSIS — R600 Localized edema: Secondary | ICD-10-CM | POA: Diagnosis present

## 2022-03-10 DIAGNOSIS — I5033 Acute on chronic diastolic (congestive) heart failure: Secondary | ICD-10-CM | POA: Insufficient documentation

## 2022-03-10 NOTE — Patient Instructions (Addendum)
Medication Instructions:  No changes  If you need a refill on your cardiac medications before your next appointment, please call your pharmacy.    Lab work: Lipid panel, fasting at [facility] (at your convenience)    Testing/Procedures: No new testing needed   Follow-Up: At [facility], you and your health needs are our priority.  As part of our continuing mission to provide you with exceptional heart care, we have created designated Provider Care Teams.  These Care Teams include your primary Cardiologist (physician) and Advanced Practice Providers (APPs -  Physician Assistants and Nurse Practitioners) who all work together to provide you with the care you need, when you need it.  You will need a follow up appointment in 6 months  Providers on your designated Care Team:   Murray Hodgkins, NP Christell Faith, PA-C Cadence Kathlen Mody, [place]  COVID-19 Vaccine Information can be found at: [email or web address].uk For questions related to vaccine distribution or appointments, please email [email or web address] or call [phone].

## 2022-03-20 ENCOUNTER — Other Ambulatory Visit
Admission: RE | Admit: 2022-03-20 | Discharge: 2022-03-20 | Disposition: A | Discharge status: Home / Self Care | Payer: Medicare Other | Attending: Cardiovascular Disease | Admitting: Cardiovascular Disease

## 2022-03-20 DIAGNOSIS — E782 Mixed hyperlipidemia: Secondary | ICD-10-CM | POA: Diagnosis present

## 2022-03-20 LAB — LIPID PANEL
Cholesterol: 144 mg/dL (ref 0–200)
HDL: 39 mg/dL — ABNORMAL LOW (ref >40)
LDL Cholesterol: 94 mg/dL (ref 0–99)
Total CHOL/HDL Ratio: 3.7 RATIO
Triglycerides: 54 mg/dL (ref <150)
VLDL: 11 mg/dL (ref 0–40)

## 2022-03-26 ENCOUNTER — Other Ambulatory Visit: Payer: Self-pay | Admitting: Cardiovascular Disease

## 2022-03-26 ENCOUNTER — Encounter: Payer: Self-pay | Admitting: Cardiovascular Disease

## 2022-03-26 DIAGNOSIS — I739 Peripheral vascular disease, unspecified: Secondary | ICD-10-CM

## 2022-03-27 MED ORDER — PRALUENT 75 MG/ML ~~LOC~~ SOAJ: 1.0000 | SUBCUTANEOUS | 11 refills | Status: DC

## 2022-03-28 ENCOUNTER — Telehealth: Payer: Self-pay

## 2022-03-28 NOTE — Telephone Encounter (Signed)
Received a prior authorization by fax for Praulaent '75mg'$ /ml pen from [facility] [phone]). Have you started this prior authorization?

## 2022-03-31 ENCOUNTER — Other Ambulatory Visit: Payer: Self-pay | Admitting: Cardiovascular Disease

## 2022-04-07 NOTE — Telephone Encounter (Signed)
PA was denied for Praluent.  Denial reason:  " The patient has tried one high-intensity statin therapy and ezetimibe for 8 weeks or longer and LDL remains 100 mg/dL or higher unless statin intolerant"

## 2022-04-10 NOTE — Telephone Encounter (Signed)
Spoke with pharmacist who states PA was not required. She did not have any issues with getting the Praluent Rx to go through the patient's insurance. It was filled on 03/28/2022 for a copay of $116. Patient picked up his prescription on 04/03/2022.  Medicare Part D [employer] [phone] ID# [id number] RxBIN: [id number]  RxGrp: [id number] PCN: [id number]

## 2022-04-10 NOTE — Telephone Encounter (Signed)
It looks like the form has been missed placed.  Called [employer] to get them to refax it to me.  Spoke with Janett Billow - she said she couldn't refax it.

## 2022-04-10 NOTE — Telephone Encounter (Signed)
Yes coverage should be approved since pt is intolerant to Lipitor, Crestor, and simvastatin, and LDL is elevated at 94 on ezetimibe above goal < 55 given hx of CAD with additional risk factors of DM, HTN, and CHF.   I am unable to resubmit a new request to his insurance since there is already a denial on file, an appeals letter will need to be submitted. [place], are you able to submit this? If not, can fax the denial letter to [phone] and the PharmD team can assist.

## 2022-04-10 NOTE — Telephone Encounter (Signed)
I cannot do anything without the denial letter. Insurance should be able to resend this if it doesn't show up in your cover my meds portal.

## 2022-04-10 NOTE — Telephone Encounter (Signed)
Thanks for calling Pharmacy!

## 2022-04-10 NOTE — Telephone Encounter (Signed)
Excellent, thank you!

## 2022-04-13 ENCOUNTER — Encounter: Payer: Self-pay | Admitting: Cardiovascular Disease

## 2022-04-14 ENCOUNTER — Telehealth: Payer: Self-pay | Admitting: Pharmacist

## 2022-04-14 ENCOUNTER — Other Ambulatory Visit (HOSPITAL_COMMUNITY): Payer: Self-pay

## 2022-04-14 DIAGNOSIS — E782 Mixed hyperlipidemia: Secondary | ICD-10-CM

## 2022-04-14 DIAGNOSIS — I2510 Atherosclerotic heart disease of native coronary artery without angina pectoris: Secondary | ICD-10-CM

## 2022-04-14 NOTE — Telephone Encounter (Signed)
Tried to initiate a request in [facility] but got an error that one was already in progress. Called [employer] to cancel pending req and expedite a new request. Reference# [id number]. I should have a determination via fax within 24 hours.

## 2022-04-16 ENCOUNTER — Other Ambulatory Visit (HOSPITAL_COMMUNITY): Payer: Self-pay

## 2022-04-18 ENCOUNTER — Other Ambulatory Visit (HOSPITAL_COMMUNITY): Payer: Self-pay

## 2022-04-18 NOTE — Telephone Encounter (Signed)
Have been calling insurance since 04/16/22 and either kept being transferred or hung up on. I finally got a supervisor on the phone today. My CMM request was cancelled on 04/10/22. On 04/14/22 representative submitted an expedited PA request (Reference [id number]) and was told I would have determination within 24 hours. I left a provider voicemail on 04/16/22 requesting an update on status from 04/14/22 request and never heard back.I did not get any communication from them regarding the outcome until today after calling a 4th time and escalating the issue to management.   My 04/14/22 expedited PA request was cancelled without any communication as to why.   It was marked in their system as a duplicate even though the previous request from 04/10/22 was cancelled already.  [phone] Resubmitted request (Reference [id number]) with a supervisor as expedited and noted the fax as [phone] to receive determination. I will have a response either end of today or by Monday afternoon. I will update encounter with determination.

## 2022-04-22 ENCOUNTER — Telehealth: Payer: Self-pay | Admitting: Cardiovascular Disease

## 2022-04-22 NOTE — Telephone Encounter (Signed)
Pt c/o medication issue:  1. Name of Medication: Alirocumab ([place]) 75 MG/ML SOAJ   2. How are you currently taking this medication (dosage and times per day)?    3. Are you having a reaction (difficulty breathing--STAT)? no  4. What is your medication issue? Calling to see if the info was received, so that the patient an continue to take the medication. Please advise

## 2022-04-23 ENCOUNTER — Encounter: Payer: Self-pay | Admitting: Cardiovascular Disease

## 2022-04-28 ENCOUNTER — Other Ambulatory Visit (HOSPITAL_COMMUNITY): Payer: Self-pay

## 2022-04-28 NOTE — Telephone Encounter (Signed)
Still giving the run around. Their system keeps canceling the request. They are putting in a critical request now (whatever that means) and have me listed as the contact person.   Reference [id number]

## 2022-04-29 ENCOUNTER — Encounter: Payer: Self-pay | Admitting: Cardiovascular Disease

## 2022-04-29 MED ORDER — PRALUENT 75 MG/ML ~~LOC~~ SOAJ: 1.0000 | SUBCUTANEOUS | 11 refills | Status: DC

## 2022-04-29 NOTE — Telephone Encounter (Signed)
Pharmacy Patient Advocate Encounter  Prior Authorization for PRALUENT '75MG'$ /ML has been approved.    Effective dates: 04/05/22 until further notice  Karie Soda, [facility] Patient Advocate Specialist Direct Number: [phone] Fax: [phone]

## 2022-04-29 NOTE — Addendum Note (Signed)
Addended by: Rollen Sox on: 04/29/2022 10:23 AM   Modules accepted: Orders

## 2022-07-03 ENCOUNTER — Inpatient Hospital Stay (HOSPITAL_BASED_OUTPATIENT_CLINIC_OR_DEPARTMENT_OTHER): Payer: Medicare Other | Admitting: Hematology & Oncology

## 2022-07-03 ENCOUNTER — Inpatient Hospital Stay: Payer: Medicare Other | Attending: Hematology & Oncology

## 2022-07-03 ENCOUNTER — Encounter: Payer: Self-pay | Admitting: Hematology & Oncology

## 2022-07-03 VITALS — BP 161/71 | HR 83 | Temp 97.7°F | Resp 20 | Ht 69.0 in | Wt 212.1 lb

## 2022-07-03 DIAGNOSIS — D696 Thrombocytopenia, unspecified: Secondary | ICD-10-CM | POA: Diagnosis not present

## 2022-07-03 DIAGNOSIS — Z7901 Long term (current) use of anticoagulants: Secondary | ICD-10-CM | POA: Insufficient documentation

## 2022-07-03 DIAGNOSIS — I4891 Unspecified atrial fibrillation: Secondary | ICD-10-CM | POA: Insufficient documentation

## 2022-07-03 LAB — CMP (CANCER CENTER ONLY)
ALT: 12 U/L (ref 0–44)
AST: 14 U/L — ABNORMAL LOW (ref 15–41)
Albumin: 4.4 g/dL (ref 3.5–5.0)
Alkaline Phosphatase: 66 U/L (ref 38–126)
Anion gap: 6 (ref 5–15)
BUN: 19 mg/dL (ref 8–23)
CO2: 28 mmol/L (ref 22–32)
Calcium: 9.8 mg/dL (ref 8.9–10.3)
Chloride: 106 mmol/L (ref 98–111)
Creatinine: 1.56 mg/dL — ABNORMAL HIGH (ref 0.61–1.24)
GFR, Estimated: 44 mL/min — ABNORMAL LOW (ref >60)
Glucose, Bld: 184 mg/dL — ABNORMAL HIGH (ref 70–99)
Potassium: 4.1 mmol/L (ref 3.5–5.1)
Sodium: 140 mmol/L (ref 135–145)
Total Bilirubin: 1.7 mg/dL — ABNORMAL HIGH (ref 0.3–1.2)
Total Protein: 7.1 g/dL (ref 6.5–8.1)

## 2022-07-03 LAB — CBC WITH DIFFERENTIAL (CANCER CENTER ONLY)
Abs Immature Granulocytes: 0.03 10*3/uL (ref 0.00–0.07)
Basophils Absolute: 0 10*3/uL (ref 0.0–0.1)
Basophils Relative: 1 %
Eosinophils Absolute: 0.2 10*3/uL (ref 0.0–0.5)
Eosinophils Relative: 3 %
HCT: 47.2 % (ref 39.0–52.0)
Hemoglobin: 15.7 g/dL (ref 13.0–17.0)
Immature Granulocytes: 1 %
Lymphocytes Relative: 15 %
Lymphs Abs: 1 10*3/uL (ref 0.7–4.0)
MCH: 30.8 pg (ref 26.0–34.0)
MCHC: 33.3 g/dL (ref 30.0–36.0)
MCV: 92.7 fL (ref 80.0–100.0)
Monocytes Absolute: 0.5 10*3/uL (ref 0.1–1.0)
Monocytes Relative: 8 %
Neutro Abs: 4.8 10*3/uL (ref 1.7–7.7)
Neutrophils Relative %: 72 %
Platelet Count: 113 10*3/uL — ABNORMAL LOW (ref 150–400)
RBC: 5.09 MIL/uL (ref 4.22–5.81)
RDW: 14.4 % (ref 11.5–15.5)
WBC Count: 6.5 10*3/uL (ref 4.0–10.5)
nRBC: 0 % (ref 0.0–0.2)

## 2022-07-03 LAB — SAVE SMEAR(SSMR), FOR PROVIDER SLIDE REVIEW

## 2022-07-03 NOTE — Progress Notes (Signed)
Hematology and Oncology Follow Up Visit  Todd Valentine [id number] [birth year] 82 y.o. 07/03/2022   Principle Diagnosis:  Transient thrombocytopenia  Current Therapy:   Observation     Interim History:  Todd Valentine is back for follow-up.  He is doing pretty well.  We last saw him back in November.  Since then, he really has had no complaints although he does have some with sciatica.  It is bothering his left leg.  He has had no problems with bleeding.  There is been no cardiac issues.  He does have the atrial fibrillation.  He is on Eliquis for this.  He has had no change in bowel or bladder habits.  He has had no cough or shortness of breath.  There is been no issues with COVID.  He has had no problems with rashes.  Does have a lot of skin damage from the sun.  He does see dermatology every 3 months.  He was at the beach over the past weekend.  He had a wonderful time down there.  Currently, I would say that his performance status is probably ECOG 1.    Medications:  Current Outpatient Medications:    acetaminophen (TYLENOL) 500 MG tablet, Take 1,000 mg by mouth every 6 (six) hours as needed for mild pain, fever or headache. , Disp: , Rfl:    Alirocumab (PRALUENT) 75 MG/ML SOAJ, Inject 1 Pen into the skin every 14 (fourteen) days., Disp: 2 mL, Rfl: 11   apixaban (ELIQUIS) 2.5 MG TABS tablet, Take 1 tablet (2.5 mg total) by mouth 2 (two) times daily., Disp: 60 tablet, Rfl: 11   carvedilol (COREG) 25 MG tablet, Take 12.5 mg by mouth 2 (two) times daily with a meal., Disp: , Rfl:    doxazosin (CARDURA) 8 MG tablet, Take 1 tablet (8 mg total) by mouth at bedtime., Disp: 90 tablet, Rfl: 3   ezetimibe (ZETIA) 10 MG tablet, Take 1 tablet (10 mg total) by mouth daily., Disp: 90 tablet, Rfl: 3   famotidine (PEPCID) 20 MG tablet, Take 1 tablet (20 mg total) by mouth at bedtime., Disp: , Rfl:    FARXIGA 10 MG TABS tablet, Take 1 tablet (10 mg total) by mouth daily., Disp: 90 tablet, Rfl:  3   furosemide (LASIX) 20 MG tablet, Take 20 mg by mouth daily., Disp: , Rfl:    glipiZIDE (GLUCOTROL XL) 5 MG 24 hr tablet, Take 5 mg by mouth daily with breakfast., Disp: , Rfl:    hydrALAZINE (APRESOLINE) 100 MG tablet, Take 1 tablet (100 mg total) by mouth every 8 (eight) hours., Disp: 270 tablet, Rfl: 3   isosorbide mononitrate (IMDUR) 60 MG 24 hr tablet, Take 60 mg one tablet in the am, one-half tablet (30 mg) in the pm and one-half tablet (30 mg) at bedtime., Disp: , Rfl:    linagliptin (TRADJENTA) 5 MG TABS tablet, Take 5 mg by mouth daily., Disp: , Rfl:    losartan (COZAAR) 100 MG tablet, Take 1 tablet (100 mg total) by mouth daily., Disp: 90 tablet, Rfl: 3   magnesium oxide (MAG-OX) 400 (241.3 Mg) MG tablet, Take 1 tablet by mouth daily., Disp: , Rfl:    Potassium Chloride ER 20 MEQ TBCR, Take 1 tablet by mouth once daily, Disp: 90 tablet, Rfl: 2   tamsulosin (FLOMAX) 0.4 MG CAPS capsule, Take 0.4 mg by mouth daily. , Disp: , Rfl:    traZODone (DESYREL) 50 MG tablet, Take 50 mg by mouth at bedtime as  needed for sleep., Disp: , Rfl:    nitroGLYCERIN (NITROSTAT) 0.4 MG SL tablet, Place 1 tablet (0.4 mg total) under the tongue every 5 (five) minutes as needed for chest pain. (Patient not taking: Reported on 07/03/2022), Disp: 25 tablet, Rfl: 3  Allergies:  Allergies  Allergen Reactions   Hydrocodone-Acetaminophen Other (See Comments)   Amlodipine Swelling    Leg swelling on 10 mg   Aspirin Other (See Comments)    Contraindicated - Kidney disease Other reaction(s): Other (See Comments), Other (See Comments) Contraindicated - Kidney disease Contraindicated - Kidney disease   Ciprofloxacin Other (See Comments)    Other reaction(s): Pt states he could hardly move Other reaction(s): Other (See Comments), Other (See Comments) Pt states he could hardly move Other reaction(s): Other (See Comments) Pt states he could hardly move Other reaction(s): Pt states he could hardly move Pt states  he could hardly move   Sitagliptin Other (See Comments)    Other reaction(s): Weakness  Other reaction(s): Other (See Comments), Other (See Comments) Weakness Other reaction(s): Other (See Comments) Weakness Other reaction(s): Weakness Weakness    Past Medical History, Surgical history, Social history, and Family History were reviewed and updated.  Review of Systems: Review of Systems  Constitutional: Negative.   HENT:  Negative.    Eyes: Negative.   Respiratory: Negative.    Cardiovascular:  Positive for leg swelling.  Gastrointestinal: Negative.   Endocrine: Negative.   Genitourinary: Negative.    Musculoskeletal: Negative.   Skin: Negative.   Neurological: Negative.   Hematological: Negative.   Psychiatric/Behavioral: Negative.      Physical Exam:  height is 5\' 9"  (1.753 m) and weight is 212 lb 1.9 oz (96.2 kg). His oral temperature is 97.7 F (36.5 C). His blood pressure is 161/71 (abnormal) and his pulse is 83. His respiration is 20 and oxygen saturation is 97%.   Wt Readings from Last 3 Encounters:  07/03/22 212 lb 1.9 oz (96.2 kg)  03/10/22 216 lb 8 oz (98.2 kg)  02/14/22 217 lb 6.4 oz (98.6 kg)    Physical Exam Vitals reviewed.  HENT:     Head: Normocephalic and atraumatic.  Eyes:     Pupils: Pupils are equal, round, and reactive to light.  Cardiovascular:     Rate and Rhythm: Normal rate and regular rhythm.     Heart sounds: Normal heart sounds.     Comments: Cardiac exam is regular rate and irregular rhythm consistent with atrial fibrillation.  He has a 3/6 systolic ejection murmur. Pulmonary:     Effort: Pulmonary effort is normal.     Breath sounds: Normal breath sounds.  Abdominal:     General: Bowel sounds are normal.     Palpations: Abdomen is soft.  Musculoskeletal:        General: No tenderness or deformity. Normal range of motion.     Cervical back: Normal range of motion.     Comments: Extremities shows 2+ edema in his lower legs.  He  does have some compression stockings on.  Lymphadenopathy:     Cervical: No cervical adenopathy.  Skin:    General: Skin is warm and dry.     Findings: No erythema or rash.  Neurological:     Mental Status: He is alert and oriented to person, place, and time.  Psychiatric:        Behavior: Behavior normal.        Thought Content: Thought content normal.        Judgment:  Judgment normal.      Lab Results  Component Value Date   WBC 6.5 07/03/2022   HGB 15.7 07/03/2022   HCT 47.2 07/03/2022   MCV 92.7 07/03/2022   PLT 113 (L) 07/03/2022     Chemistry      Component Value Date/Time   NA 139 01/02/2022 1515   NA 143 08/07/2014 0838   NA 134 (L) 10/06/2013 0728   K 3.9 01/02/2022 1515   K 3.2 (L) 10/06/2013 0728   CL 104 01/02/2022 1515   CL 99 10/06/2013 0728   CO2 25 01/02/2022 1515   CO2 29 10/06/2013 0728   BUN 30 (H) 01/02/2022 1515   BUN 20 08/07/2014 0838   BUN 30 (H) 10/06/2013 0728   CREATININE 1.56 (H) 01/02/2022 1515   CREATININE 1.56 (H) 10/06/2013 0728      Component Value Date/Time   CALCIUM 9.4 01/02/2022 1515   CALCIUM 8.6 10/06/2013 0728   ALKPHOS 101 01/02/2022 1515   ALKPHOS 63 10/03/2013 0359   AST 16 01/02/2022 1515   ALT 17 01/02/2022 1515   ALT 62 10/03/2013 0359   BILITOT 1.0 01/02/2022 1515       Impression and Plan: Mr. Nevius is a very nice 82 year old white male.  He has had transient thrombocytopenia.  His platelet count is now back down a little bit.  It would not surprise me his platelet count does fluctuate.  He is on quite a few medications.  It would not surprise me if he does not have possibly some medication interactions that could be causing some of the thrombocytopenia.  Again his platelet count is not all that bad.  I looked at his blood smear was not all that impressive.  He may have had a few large platelets.  I do not see any nucleated red blood cells.  I do not see any schistocytes.  He had mature white blood cells  without hypersegmented polys or immature myeloid cells.  At this point, we will plan to get him back in about 3 months.  I want to make sure that we follow this little bit more closely.    Josph Macho, MD 5/9/20243:18 PM

## 2022-08-15 ENCOUNTER — Encounter (INDEPENDENT_AMBULATORY_CARE_PROVIDER_SITE_OTHER): Payer: Medicare Other

## 2022-08-15 ENCOUNTER — Ambulatory Visit (INDEPENDENT_AMBULATORY_CARE_PROVIDER_SITE_OTHER): Payer: Medicare Other | Admitting: Vascular Surgery

## 2022-10-08 ENCOUNTER — Other Ambulatory Visit (INDEPENDENT_AMBULATORY_CARE_PROVIDER_SITE_OTHER): Payer: Self-pay | Admitting: Nurse Practitioner

## 2022-10-08 DIAGNOSIS — I739 Peripheral vascular disease, unspecified: Secondary | ICD-10-CM

## 2022-10-08 DIAGNOSIS — I701 Atherosclerosis of renal artery: Secondary | ICD-10-CM

## 2022-10-10 ENCOUNTER — Ambulatory Visit (INDEPENDENT_AMBULATORY_CARE_PROVIDER_SITE_OTHER): Payer: Medicare Other | Admitting: Vascular Surgery

## 2022-10-10 ENCOUNTER — Encounter (INDEPENDENT_AMBULATORY_CARE_PROVIDER_SITE_OTHER): Payer: Medicare Other

## 2022-10-16 NOTE — Progress Notes (Signed)
Cardiology Office Note  Date:  10/17/2022   ID:  NANAYAW WENNBERG, DOB 03/31/1940, MRN [id number]  PCP:  [facility]   Chief Complaint  Patient presents with   6 month follow up     "Doing well." Medications reviewed by the patient verbally.     HPI:  Mr. 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   Denies significant leg swelling  Tolerating PCSK9 inhibitor  Current medication regimen as below A.m. Hydralazine 100 every Q8 Isosorbide 60 in the morning Carvedilol 12.5 in the PM Losartan 100 daily Lasix 20 mg in AM   3 pm:  hydralazine 100 mg   Pm Doxazosin 8 mg Isosorbide 30 in the evening Coreg 12.5 in the PM   Bedtime:  Hydralazine 100 mg  Isosorbide 30 mg before bed    Lab work reviewed Hemoglobin A1c 6.6 Normal CBC Creatinine 1.5 BUN 30 GFR 43 Total chol 122, LDL61  EKG personally reviewed by myself on todays visit EKG Interpretation Date/Time:  Friday October 17 2022 08:33:54 EDT Ventricular Rate:  69 PR Interval:    QRS Duration:  144 QT Interval:  444 QTC Calculation: 475 R Axis:   87  Text Interpretation: Atrial fibrillation with a competing junctional pacemaker  Right bundle branch block When compared with ECG of 07-Nov-2021 14:44, PREVIOUS ECG IS PRESENT Confirmed by Julien Nordmann [phone]) on 10/17/2022 8:40:30 AM    December 19, 2021 for leg swelling while at the beach Was taking extra Lasix Of note amlodipine started on recent hospitalization Swimmy head,  better by spreading out meds, Am and PM claudication sx on long ambulation, relieved by resting 1 to 2 minutes, improved by holding statin  Other past medical history reviewed Hospitalized September 2023 for bradycardia, shortness of breath Clonidine diltiazem carvedilol held initially though lower dose carvedilol was reinstated close to discharge given runs of tachycardia Coreg 12.5 twice daily reinitiated  Currently reports taking 17 medications in the morning, has some lightheadedness Thinks he is overmedicated in the morning Previously was taking several of his blood pressure medications in the evening and had no symptoms  Continues to have leg weakness, thinks he might go back to the gym Reports having some pain in his legs with ambulation Concerned about claudication symptoms Concerned as he has worsening leg edema  Lives alone with supportive family after wife unexpectedly passed away 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 Declined ventilator if needed acute kidney injury/ATN,  requiring CRRT Diltiazem dose increased at d/c up to 240 daily D/c on pradaxa (was going to change to eliquis) Off lisinopril/HCTZ, metformin  Labs at  d/c  CR 1.64, BUN 33  total cholesterol well controlled, 130 or less    hospital admission 10/02/2013 with discharge August 12. He presented with shortness of breath, Atrial fibrillation with RVR. Echocardiogram showed normal ejection fraction. Symptoms  improved with diuresis.   Echocardiogram 10/02/2013 showing ejection fraction 55-60%, mildly dilated left atrium, mild to moderate MR and TR   He had cardiac catheterization 10/06/2013 showing occluded diagonal vessel, 40% proximal LAD disease, bifurcating marginal branch/ramus. Ramus had 60% followed by 80% disease, OM with 50% mid disease at least. RCA with 40% mid disease. Medical management recommended  PMH:   has a past medical history of Acute renal failure ([name]), BPH (benign prostatic hyperplasia), Chronic atrial fibrillation ([name]), Chronic diastolic CHF (congestive heart failure) ([name]), Chronic insomnia, Coronary artery disease, non-occlusive (10/06/2013), Depression, Diabetes mellitus with complication ([name]), Diverticulitis of colon, Dysrhythmia, Heart murmur, History of diverticulitis, Hypercholesterolemia, Hyperkalemia, Hyperlipidemia, Hyperlipidemia, Hypertension, Hypokalemia, Hypomagnesemia, Malignant melanoma ([name]), Melanoma ([name]), Mitral regurgitation, and Sleep apnea in adult.  PSH:    Past Surgical History:  Procedure Laterality Date   APPENDECTOMY     CARDIAC CATHETERIZATION  10/06/2013   COLONOSCOPY     COLONOSCOPY WITH PROPOFOL N/A 11/23/2014   Procedure: COLONOSCOPY WITH PROPOFOL;  Surgeon: Scot Jun, MD;  Location: [facility] ENDOSCOPY;  Service: Endoscopy;  Laterality: N/A;   COLONOSCOPY WITH PROPOFOL N/A 04/11/2020   Procedure: COLONOSCOPY WITH PROPOFOL;  Surgeon: Toledo, Boykin Nearing, MD;  Location: [facility] ENDOSCOPY;  Service: Gastroenterology;  Laterality: N/A;  DM  ELIQUIS   MOHS SURGERY     removal tubular adenoma     RENAL ANGIOGRAPHY Right 11/13/2021   Procedure: RENAL ANGIOGRAPHY;  Surgeon: Annice Needy, MD;  Location: [facility] INVASIVE CV LAB;  Service: Cardiovascular;  Laterality: Right;    Current Outpatient Medications on File Prior to Visit  Medication Sig Dispense Refill   acetaminophen (TYLENOL) 500 MG tablet Take 1,000 mg by mouth every 6 (six) hours as needed  for mild pain, fever or headache.      apixaban (ELIQUIS) 2.5 MG TABS tablet Take 1 tablet (2.5 mg total) by mouth 2 (two) times daily. 60 tablet 11   carvedilol (COREG) 25 MG tablet Take 12.5 mg by mouth 2 (two) times daily with a meal.     famotidine (PEPCID) 20 MG tablet Take 1 tablet (20 mg total) by mouth at bedtime.     furosemide (LASIX) 20 MG tablet Take 20 mg by mouth daily.     glipiZIDE (GLUCOTROL XL) 5 MG 24 hr tablet Take 5 mg by mouth daily with breakfast.     isosorbide mononitrate (IMDUR) 60 MG 24 hr tablet Take 60 mg one tablet in the am, one-half tablet (30 mg) in the pm and one-half tablet (30 mg) at bedtime.     linagliptin (TRADJENTA) 5 MG TABS tablet Take 5 mg by mouth daily.     magnesium oxide (MAG-OX) 400 (241.3 Mg) MG tablet Take 1 tablet by mouth daily.     nitroGLYCERIN (NITROSTAT) 0.4 MG SL tablet Place 1 tablet (0.4 mg total) under the tongue every 5 (five) minutes as needed for chest pain. 25 tablet 3   Potassium Chloride ER 20 MEQ TBCR Take 1 tablet by mouth once daily 90 tablet 2   tamsulosin (FLOMAX) 0.4 MG CAPS capsule Take 0.4 mg by mouth daily.      traZODone (DESYREL) 50 MG tablet Take 50 mg by mouth at bedtime as needed for sleep.  No current facility-administered medications on file prior to visit.    Allergies:   Hydrocodone-acetaminophen, Amlodipine, Aspirin, Ciprofloxacin, and Sitagliptin   Social History:  The patient  reports that he quit smoking about 35 years ago. His smoking use included cigarettes. He started smoking about 65 years ago. He has a 90 pack-year smoking history. He has never used smokeless tobacco. He reports current alcohol use. He reports that he does not currently use drugs.   Family History:   Family history is unknown by patient.    Review of Systems: Review of Systems  Constitutional: Negative.   HENT: Negative.    Respiratory: Negative.    Cardiovascular: Negative.   Gastrointestinal: Negative.   Musculoskeletal:  Negative.        Gait instability  Neurological: Negative.   Psychiatric/Behavioral: Negative.    All other systems reviewed and are negative.   PHYSICAL EXAM: VS:  BP (!) 150/66 (BP Location: Left Arm, Patient Position: Sitting, Cuff Size: Normal)   Pulse 69   Ht 5' 8.5" (1.74 m)   Wt 215 lb 4 oz (97.6 kg)   SpO2 94%   BMI 32.25 kg/m  , BMI Body mass index is 32.25 kg/m. Constitutional:  oriented to person, place, and time. No distress.  HENT:  Head: Grossly normal Eyes:  no discharge. No scleral icterus.  Neck: No JVD, no carotid bruits  Cardiovascular: Regular rate and rhythm, no murmurs appreciated Pulmonary/Chest: Clear to auscultation bilaterally, no wheezes or rails Abdominal: Soft.  no distension.  no tenderness.  Musculoskeletal: Normal range of motion Neurological:  normal muscle tone. Coordination normal. No atrophy Skin: Skin warm and dry Psychiatric: normal affect, pleasant   Recent Labs: 11/08/2021: Magnesium 2.1; TSH 1.921 07/03/2022: ALT 12; BUN 19; Creatinine 1.56; Hemoglobin 15.7; Platelet Count 113; Potassium 4.1; Sodium 140    Lipid Panel Lab Results  Component Value Date   CHOL 144 03/20/2022   HDL 39 (L) 03/20/2022   LDLCALC 94 03/20/2022   TRIG 54 03/20/2022    Wt Readings from Last 3 Encounters:  10/17/22 215 lb 4 oz (97.6 kg)  07/03/22 212 lb 1.9 oz (96.2 kg)  03/10/22 216 lb 8 oz (98.2 kg)     ASSESSMENT AND PLAN:  Permanent atrial fibrillation ([name]) - Plan: EKG 12-Lead Tachybradycardia syndrome hospitalized for symptomatic bradycardia September 2023 tolerating carvedilol 12.5 twice daily, rate well-controlled Tolerating Eliquis 2.5 twice daily, given age over 54 renal dysfunction  Essential hypertension Will continue regimen as below A.m. Hydralazine 100 every 8hr (3 pm) Isosorbide 60 in the morning Carvedilol 12.5 in the PM Losartan 100 daily Lasix 20 mg  in AM  Pm Doxazosin 8 mg Isosorbide 30 in the evening,  Coreg  12.5 Tamsulosin  Bedtime:  Hydralazine 100 mg  Isosorbide 30 mg before bed  Prior history of leg swelling on amlodipine  chronic diastolic CHF (congestive heart failure) ([name]) - Continue Lasix 20 daily, Euvolemic, blood pressure stable, renal function stable  Mixed hyperlipidemia  Statin myalgia, lipids well-controlled on Praluent  Type 2 diabetes mellitus with complication, without long-term current use of insulin ([name]) - A1c stable, 6.6  Acute on chronic renal failure Continue Lasix 20 daily, creatinine 1.5  Leg pain Better by holding amloidpine and crestor Repeat ABIs in December  CAD with stable angina Currently with no symptoms of angina. No further workup at this time. Continue current medication regimen.    Total encounter time more than 30 minutes  Greater than 50% was spent in counseling  and coordination of care with the patient     Orders Placed This Encounter  Procedures   EKG 12-Lead   ECHOCARDIOGRAM COMPLETE   VAS [place] ABI WITH/WO TBI   VAS [place] LOWER EXTREMITY ARTERIAL DUPLEX      Signed, Dossie Arbour, M.D., Ph.D. 10/17/2022  [facility] [place], [place] [phone]

## 2022-10-17 ENCOUNTER — Encounter: Payer: Self-pay | Admitting: Cardiovascular Disease

## 2022-10-17 ENCOUNTER — Ambulatory Visit: Payer: Medicare Other | Admitting: Cardiovascular Disease

## 2022-10-17 VITALS — BP 150/66 | HR 69 | Ht 68.5 in | Wt 215.2 lb

## 2022-10-17 DIAGNOSIS — K551 Chronic vascular disorders of intestine: Secondary | ICD-10-CM | POA: Diagnosis present

## 2022-10-17 DIAGNOSIS — I350 Nonrheumatic aortic (valve) stenosis: Secondary | ICD-10-CM | POA: Insufficient documentation

## 2022-10-17 DIAGNOSIS — I739 Peripheral vascular disease, unspecified: Secondary | ICD-10-CM | POA: Diagnosis present

## 2022-10-17 DIAGNOSIS — E782 Mixed hyperlipidemia: Secondary | ICD-10-CM | POA: Insufficient documentation

## 2022-10-17 DIAGNOSIS — R0602 Shortness of breath: Secondary | ICD-10-CM | POA: Diagnosis present

## 2022-10-17 DIAGNOSIS — I2510 Atherosclerotic heart disease of native coronary artery without angina pectoris: Secondary | ICD-10-CM | POA: Insufficient documentation

## 2022-10-17 DIAGNOSIS — I5033 Acute on chronic diastolic (congestive) heart failure: Secondary | ICD-10-CM | POA: Diagnosis present

## 2022-10-17 DIAGNOSIS — E118 Type 2 diabetes mellitus with unspecified complications: Secondary | ICD-10-CM | POA: Insufficient documentation

## 2022-10-17 DIAGNOSIS — I10 Essential (primary) hypertension: Secondary | ICD-10-CM | POA: Insufficient documentation

## 2022-10-17 DIAGNOSIS — I25118 Atherosclerotic heart disease of native coronary artery with other forms of angina pectoris: Secondary | ICD-10-CM | POA: Diagnosis present

## 2022-10-17 DIAGNOSIS — R600 Localized edema: Secondary | ICD-10-CM | POA: Insufficient documentation

## 2022-10-17 DIAGNOSIS — I4892 Unspecified atrial flutter: Secondary | ICD-10-CM | POA: Diagnosis present

## 2022-10-17 MED ORDER — LOSARTAN POTASSIUM 100 MG PO TABS: 100.0000 mg | ORAL_TABLET | Freq: Every day | ORAL | 3 refills | Status: DC

## 2022-10-17 MED ORDER — PRALUENT 75 MG/ML ~~LOC~~ SOAJ
75.0000 mg | SUBCUTANEOUS | 3 refills | Status: DC
Start: 2022-10-17 — End: 2023-06-02

## 2022-10-17 MED ORDER — HYDRALAZINE HCL 100 MG PO TABS: 100.0000 mg | ORAL_TABLET | Freq: Three times a day (TID) | ORAL | 3 refills | Status: DC

## 2022-10-17 MED ORDER — FARXIGA 10 MG PO TABS: 10.0000 mg | ORAL_TABLET | Freq: Every day | ORAL | 3 refills | Status: DC

## 2022-10-17 MED ORDER — DOXAZOSIN MESYLATE 8 MG PO TABS: 8.0000 mg | ORAL_TABLET | Freq: Every day | ORAL | 3 refills | Status: DC

## 2022-10-17 MED ORDER — EZETIMIBE 10 MG PO TABS
10.0000 mg | ORAL_TABLET | Freq: Every day | ORAL | 3 refills | Status: DC
Start: 2022-10-17 — End: 2023-10-30

## 2022-10-17 NOTE — Patient Instructions (Addendum)
Medication Instructions:  No changes  If you need a refill on your cardiac medications before your next appointment, please call your pharmacy.   Lab work: No new labs needed  Testing/Procedures: Echo for aortic valve stenosis- Dec 2024 ABI for  LE arterial doppler for claudication -Dec 2024  Your physician has requested that you have an echocardiogram. Echocardiography is a painless test that uses sound waves to create images of your heart. It provides your doctor with information about the size and shape of your heart and how well your heart's chambers and valves are working.   You may receive an ultrasound enhancing agent through an IV if needed to better visualize your heart during the echo. This procedure takes approximately one hour.  There are no restrictions for this procedure.  This will take place at [address] ([facility], [place] [address]   Your physician has requested that you have an ankle brachial index (ABI). During this test an ultrasound and blood pressure cuff are used to evaluate the arteries that supply the arms and legs with blood.  Allow thirty minutes for this exam.  There are no restrictions or special instructions.  This will take place at [address] ([facility]) [address], [place] [address]   Your physician has requested that you have a lower extremity arterial duplex. During this test, ultrasound is used to evaluate arterial blood flow in the legs. Allow one hour for this exam. There are no restrictions or special instructions. This will take place at [address] ([facility], [place] [address]     Follow-Up: At [facility], you and your health needs are our priority.  As part of our continuing mission to provide you with exceptional heart care, we have created designated Provider Care Teams.  These Care Teams include your primary Cardiologist (physician) and Advanced Practice Providers (APPs  -  Physician Assistants and Nurse Practitioners) who all work together to provide you with the care you need, when you need it.  You will need a follow up appointment in 12 months  Providers on your designated Care Team:   Nicolasa Ducking, NP Eula Listen, PA-C Cadence Fransico Michael, [place]  COVID-19 Vaccine Information can be found at: [email or web address] For questions related to vaccine distribution or appointments, please email vaccine@[email or web address] or call [phone].

## 2022-12-10 ENCOUNTER — Other Ambulatory Visit: Payer: Self-pay | Admitting: Cardiovascular Disease

## 2022-12-30 ENCOUNTER — Other Ambulatory Visit: Payer: Self-pay | Admitting: Cardiovascular Disease

## 2022-12-30 DIAGNOSIS — I482 Chronic atrial fibrillation, unspecified: Secondary | ICD-10-CM

## 2022-12-30 NOTE — Telephone Encounter (Signed)
Refill Request.  

## 2022-12-30 NOTE — Telephone Encounter (Signed)
Prescription refill request for Eliquis received. Indication: AF Last office visit: 10/17/22  Concha Se MD Scr: 1.47 on 12/01/22  1.5 on 07/24/22 Age: 82 Weight: 91.6kg  Continue Eliquis 2.5mg  daily due to age and Kidney Fx.  Refill approved.

## 2023-01-07 ENCOUNTER — Other Ambulatory Visit: Payer: Self-pay | Admitting: Cardiovascular Disease

## 2023-01-21 ENCOUNTER — Other Ambulatory Visit: Payer: Self-pay | Admitting: Cardiovascular Disease

## 2023-02-03 ENCOUNTER — Other Ambulatory Visit: Payer: Self-pay | Admitting: Cardiovascular Disease

## 2023-02-10 ENCOUNTER — Telehealth: Payer: Self-pay | Admitting: Cardiovascular Disease

## 2023-02-10 NOTE — Telephone Encounter (Signed)
Left a message for the patient to call back.  

## 2023-02-10 NOTE — Telephone Encounter (Signed)
patient's daughter is calling to see if there was any specific directions the patient need to follow before test

## 2023-02-13 ENCOUNTER — Ambulatory Visit: Payer: Medicare Other

## 2023-02-13 NOTE — Telephone Encounter (Signed)
Left message to call back if anything was needed.

## 2023-03-12 ENCOUNTER — Ambulatory Visit: Payer: Medicare Other

## 2023-04-09 ENCOUNTER — Ambulatory Visit (INDEPENDENT_AMBULATORY_CARE_PROVIDER_SITE_OTHER): Payer: Medicare Other

## 2023-04-09 ENCOUNTER — Ambulatory Visit: Payer: Medicare Other

## 2023-04-09 ENCOUNTER — Ambulatory Visit: Payer: Medicare Other | Attending: Cardiovascular Disease

## 2023-04-09 DIAGNOSIS — I739 Peripheral vascular disease, unspecified: Secondary | ICD-10-CM

## 2023-04-09 DIAGNOSIS — I350 Nonrheumatic aortic (valve) stenosis: Secondary | ICD-10-CM | POA: Diagnosis not present

## 2023-04-09 LAB — ECHOCARDIOGRAM COMPLETE
AR max vel: 1 cm2
AV Area VTI: 1.05 cm2
AV Area mean vel: 0.97 cm2
AV Mean grad: 15.8 mm[Hg]
AV Peak grad: 33.4 mm[Hg]
Ao pk vel: 2.89 m/s
Calc EF: 53.7 %
S' Lateral: 3.1 cm
Single Plane A2C EF: 53.1 %
Single Plane A4C EF: 55.5 %

## 2023-04-09 LAB — VAS US ABI WITH/WO TBI
Left ABI: 0.81
Right ABI: 0.92

## 2023-04-10 ENCOUNTER — Encounter: Payer: Self-pay | Admitting: Cardiovascular Disease

## 2023-04-13 ENCOUNTER — Other Ambulatory Visit: Payer: Self-pay | Admitting: Emergency Medicine

## 2023-04-13 ENCOUNTER — Encounter: Payer: Self-pay | Admitting: Emergency Medicine

## 2023-04-13 DIAGNOSIS — I739 Peripheral vascular disease, unspecified: Secondary | ICD-10-CM

## 2023-06-02 ENCOUNTER — Other Ambulatory Visit (HOSPITAL_COMMUNITY): Payer: Self-pay

## 2023-06-02 ENCOUNTER — Other Ambulatory Visit: Payer: Self-pay | Admitting: Cardiovascular Disease

## 2023-06-02 ENCOUNTER — Telehealth: Payer: Self-pay | Admitting: Cardiovascular Disease

## 2023-06-02 DIAGNOSIS — E782 Mixed hyperlipidemia: Secondary | ICD-10-CM

## 2023-06-02 DIAGNOSIS — I2510 Atherosclerotic heart disease of native coronary artery without angina pectoris: Secondary | ICD-10-CM

## 2023-06-02 MED ORDER — PRALUENT 75 MG/ML ~~LOC~~ SOAJ
75.0000 mg | SUBCUTANEOUS | 3 refills | Status: DC
Start: 2023-06-02 — End: 2023-10-23
  Filled 2023-06-02: qty 2, 28d supply, fill #0

## 2023-06-02 NOTE — Telephone Encounter (Signed)
*  STAT* If patient is at the pharmacy, call can be transferred to refill team.   1. Which medications need to be refilled? (please list name of each medication and dose if known) Alirocumab (PRALUENT) 75 MG/ML SOAJ    2. Would you like to learn more about the convenience, safety, & potential cost savings by using the [facility]? No    3. Are you open to using the [facility] (Type [facility]. ). No   4. Which pharmacy/location (including street and city if local pharmacy) is medication to be sent to?  [id number] - [place], [place] - [address]     5. Do they need a 30 day or 90 day supply? 90 day

## 2023-06-12 ENCOUNTER — Other Ambulatory Visit (HOSPITAL_COMMUNITY): Payer: Self-pay

## 2023-07-10 ENCOUNTER — Other Ambulatory Visit: Payer: Self-pay | Admitting: Cardiovascular Disease

## 2023-07-10 DIAGNOSIS — I482 Chronic atrial fibrillation, unspecified: Secondary | ICD-10-CM

## 2023-07-10 MED ORDER — ISOSORBIDE MONONITRATE ER 60 MG PO TB24: ORAL_TABLET | ORAL | 1 refills | Status: DC

## 2023-07-10 MED ORDER — APIXABAN 2.5 MG PO TABS: 2.5000 mg | ORAL_TABLET | Freq: Two times a day (BID) | ORAL | 5 refills | Status: DC

## 2023-07-10 NOTE — Telephone Encounter (Signed)
*  STAT* If patient is at the pharmacy, call can be transferred to refill team.   1. Which medications need to be refilled? (please list name of each medication and dose if known) apixaban  (ELIQUIS ) 2.5 MG TABS tablet  isosorbide  mononitrate (IMDUR ) 60 MG 24 hr tablet    2. Would you like to learn more about the convenience, safety, & potential cost savings by using the [facility]?     3. Are you open to using the [facility] (Type [facility]. ).   4. Which pharmacy/location (including street and city if local pharmacy) is medication to be sent to? [id number] - [place], [place] - [address]    5. Do they need a 30 day or 90 day supply? 90 day

## 2023-07-10 NOTE — Telephone Encounter (Signed)
 Pt's medication was sent to pt's pharmacy as requested. Confirmation received.

## 2023-07-10 NOTE — Telephone Encounter (Signed)
 Prescription refill request for Eliquis  received. Indication:aflutter Last office visit:8/24 Scr:1.7  12/24 Age: 83 Weight:97.6  kg  Prescription refilled

## 2023-07-14 ENCOUNTER — Encounter (INDEPENDENT_AMBULATORY_CARE_PROVIDER_SITE_OTHER): Payer: Self-pay

## 2023-07-24 ENCOUNTER — Emergency Department

## 2023-07-24 ENCOUNTER — Inpatient Hospital Stay
Admission: EM | Admit: 2023-07-24 | Discharge: 2023-07-26 | DRG: 291 | Disposition: A | Discharge status: Home / Self Care | Attending: Internal Medicine | Admitting: Internal Medicine

## 2023-07-24 ENCOUNTER — Telehealth: Payer: Self-pay | Admitting: Cardiovascular Disease

## 2023-07-24 ENCOUNTER — Other Ambulatory Visit: Payer: Self-pay

## 2023-07-24 DIAGNOSIS — Z888 Allergy status to other drugs, medicaments and biological substances status: Secondary | ICD-10-CM

## 2023-07-24 DIAGNOSIS — N1831 Chronic kidney disease, stage 3a: Secondary | ICD-10-CM | POA: Diagnosis present

## 2023-07-24 DIAGNOSIS — R0601 Orthopnea: Secondary | ICD-10-CM | POA: Diagnosis not present

## 2023-07-24 DIAGNOSIS — R0609 Other forms of dyspnea: Secondary | ICD-10-CM | POA: Diagnosis present

## 2023-07-24 DIAGNOSIS — R001 Bradycardia, unspecified: Secondary | ICD-10-CM | POA: Diagnosis present

## 2023-07-24 DIAGNOSIS — Z886 Allergy status to analgesic agent status: Secondary | ICD-10-CM

## 2023-07-24 DIAGNOSIS — Z7901 Long term (current) use of anticoagulants: Secondary | ICD-10-CM

## 2023-07-24 DIAGNOSIS — I10 Essential (primary) hypertension: Secondary | ICD-10-CM | POA: Diagnosis present

## 2023-07-24 DIAGNOSIS — I509 Heart failure, unspecified: Principal | ICD-10-CM

## 2023-07-24 DIAGNOSIS — N183 Chronic kidney disease, stage 3 unspecified: Secondary | ICD-10-CM

## 2023-07-24 DIAGNOSIS — Z79899 Other long term (current) drug therapy: Secondary | ICD-10-CM

## 2023-07-24 DIAGNOSIS — Z881 Allergy status to other antibiotic agents status: Secondary | ICD-10-CM

## 2023-07-24 DIAGNOSIS — I130 Hypertensive heart and chronic kidney disease with heart failure and stage 1 through stage 4 chronic kidney disease, or unspecified chronic kidney disease: Principal | ICD-10-CM | POA: Diagnosis present

## 2023-07-24 DIAGNOSIS — I161 Hypertensive emergency: Secondary | ICD-10-CM | POA: Diagnosis present

## 2023-07-24 DIAGNOSIS — Z885 Allergy status to narcotic agent status: Secondary | ICD-10-CM

## 2023-07-24 DIAGNOSIS — I2510 Atherosclerotic heart disease of native coronary artery without angina pectoris: Secondary | ICD-10-CM | POA: Diagnosis present

## 2023-07-24 DIAGNOSIS — I482 Chronic atrial fibrillation, unspecified: Secondary | ICD-10-CM | POA: Diagnosis present

## 2023-07-24 DIAGNOSIS — N179 Acute kidney failure, unspecified: Secondary | ICD-10-CM | POA: Diagnosis present

## 2023-07-24 DIAGNOSIS — N2581 Secondary hyperparathyroidism of renal origin: Secondary | ICD-10-CM | POA: Diagnosis present

## 2023-07-24 DIAGNOSIS — F5104 Psychophysiologic insomnia: Secondary | ICD-10-CM | POA: Diagnosis present

## 2023-07-24 DIAGNOSIS — E1122 Type 2 diabetes mellitus with diabetic chronic kidney disease: Secondary | ICD-10-CM | POA: Diagnosis present

## 2023-07-24 DIAGNOSIS — G4730 Sleep apnea, unspecified: Secondary | ICD-10-CM | POA: Diagnosis present

## 2023-07-24 DIAGNOSIS — Z87891 Personal history of nicotine dependence: Secondary | ICD-10-CM

## 2023-07-24 DIAGNOSIS — N400 Enlarged prostate without lower urinary tract symptoms: Secondary | ICD-10-CM

## 2023-07-24 DIAGNOSIS — E118 Type 2 diabetes mellitus with unspecified complications: Secondary | ICD-10-CM | POA: Diagnosis present

## 2023-07-24 DIAGNOSIS — F32A Depression, unspecified: Secondary | ICD-10-CM | POA: Diagnosis present

## 2023-07-24 DIAGNOSIS — I5033 Acute on chronic diastolic (congestive) heart failure: Secondary | ICD-10-CM | POA: Diagnosis not present

## 2023-07-24 DIAGNOSIS — I160 Hypertensive urgency: Secondary | ICD-10-CM | POA: Diagnosis present

## 2023-07-24 DIAGNOSIS — E7800 Pure hypercholesterolemia, unspecified: Secondary | ICD-10-CM | POA: Diagnosis present

## 2023-07-24 DIAGNOSIS — E876 Hypokalemia: Secondary | ICD-10-CM | POA: Diagnosis present

## 2023-07-24 DIAGNOSIS — Z7984 Long term (current) use of oral hypoglycemic drugs: Secondary | ICD-10-CM

## 2023-07-24 DIAGNOSIS — Z85820 Personal history of malignant melanoma of skin: Secondary | ICD-10-CM

## 2023-07-24 LAB — CBC WITH DIFFERENTIAL/PLATELET
Abs Immature Granulocytes: 0.02 10*3/uL (ref 0.00–0.07)
Basophils Absolute: 0 10*3/uL (ref 0.0–0.1)
Basophils Relative: 1 %
Eosinophils Absolute: 0.1 10*3/uL (ref 0.0–0.5)
Eosinophils Relative: 2 %
HCT: 44.6 % (ref 39.0–52.0)
Hemoglobin: 14.4 g/dL (ref 13.0–17.0)
Immature Granulocytes: 0 %
Lymphocytes Relative: 12 %
Lymphs Abs: 0.8 10*3/uL (ref 0.7–4.0)
MCH: 30 pg (ref 26.0–34.0)
MCHC: 32.3 g/dL (ref 30.0–36.0)
MCV: 92.9 fL (ref 80.0–100.0)
Monocytes Absolute: 0.6 10*3/uL (ref 0.1–1.0)
Monocytes Relative: 10 %
Neutro Abs: 4.7 10*3/uL (ref 1.7–7.7)
Neutrophils Relative %: 75 %
Platelets: 124 10*3/uL — ABNORMAL LOW (ref 150–400)
RBC: 4.8 MIL/uL (ref 4.22–5.81)
RDW: 13.7 % (ref 11.5–15.5)
WBC: 6.2 10*3/uL (ref 4.0–10.5)
nRBC: 0 % (ref 0.0–0.2)

## 2023-07-24 LAB — COMPREHENSIVE METABOLIC PANEL WITH GFR
ALT: 20 U/L (ref 0–44)
AST: 22 U/L (ref 15–41)
Albumin: 3.6 g/dL (ref 3.5–5.0)
Alkaline Phosphatase: 77 U/L (ref 38–126)
Anion gap: 9 (ref 5–15)
BUN: 30 mg/dL — ABNORMAL HIGH (ref 8–23)
CO2: 22 mmol/L (ref 22–32)
Calcium: 8.8 mg/dL — ABNORMAL LOW (ref 8.9–10.3)
Chloride: 106 mmol/L (ref 98–111)
Creatinine, Ser: 1.8 mg/dL — ABNORMAL HIGH (ref 0.61–1.24)
GFR, Estimated: 37 mL/min — ABNORMAL LOW (ref >60)
Glucose, Bld: 149 mg/dL — ABNORMAL HIGH (ref 70–99)
Potassium: 4.6 mmol/L (ref 3.5–5.1)
Sodium: 137 mmol/L (ref 135–145)
Total Bilirubin: 1.6 mg/dL — ABNORMAL HIGH (ref 0.0–1.2)
Total Protein: 6.6 g/dL (ref 6.5–8.1)

## 2023-07-24 LAB — MAGNESIUM: Magnesium: 2.2 mg/dL (ref 1.7–2.4)

## 2023-07-24 LAB — TROPONIN I (HIGH SENSITIVITY)
Troponin I (High Sensitivity): 17 ng/L (ref <18)
Troponin I (High Sensitivity): 13 ng/L (ref <18)

## 2023-07-24 LAB — PHOSPHORUS: Phosphorus: 3.4 mg/dL (ref 2.5–4.6)

## 2023-07-24 LAB — HEMOGLOBIN A1C
Hgb A1c MFr Bld: 7 % — ABNORMAL HIGH (ref 4.8–5.6)
Mean Plasma Glucose: 154.2 mg/dL

## 2023-07-24 LAB — BRAIN NATRIURETIC PEPTIDE: B Natriuretic Peptide: 415 pg/mL — ABNORMAL HIGH (ref 0.0–100.0)

## 2023-07-24 LAB — GLUCOSE, CAPILLARY
Glucose-Capillary: 117 mg/dL — ABNORMAL HIGH (ref 70–99)
Glucose-Capillary: 111 mg/dL — ABNORMAL HIGH (ref 70–99)

## 2023-07-24 LAB — TSH: TSH: 2.983 u[IU]/mL (ref 0.350–4.500)

## 2023-07-24 MED ORDER — LOSARTAN POTASSIUM 50 MG PO TABS
100.0000 mg | ORAL_TABLET | Freq: Every day | ORAL | Status: DC
  Administered 2023-07-25 – 2023-07-26 (×2): 100 mg via ORAL
  Filled 2023-07-24 (×2): qty 2

## 2023-07-24 MED ORDER — SODIUM CHLORIDE 0.9% FLUSH
3.0000 mL | Freq: Two times a day (BID) | INTRAVENOUS | Status: DC
  Administered 2023-07-24 – 2023-07-26 (×5): 3 mL via INTRAVENOUS

## 2023-07-24 MED ORDER — POTASSIUM CHLORIDE CRYS ER 20 MEQ PO TBCR: 20.0000 meq | EXTENDED_RELEASE_TABLET | Freq: Every day | ORAL | Status: DC

## 2023-07-24 MED ORDER — DOXAZOSIN MESYLATE 4 MG PO TABS
8.0000 mg | ORAL_TABLET | Freq: Every day | ORAL | Status: DC
  Administered 2023-07-24 – 2023-07-25 (×2): 8 mg via ORAL
  Filled 2023-07-24: qty 1
  Filled 2023-07-24 (×2): qty 2
  Filled 2023-07-24: qty 1

## 2023-07-24 MED ORDER — INSULIN ASPART 100 UNIT/ML IJ SOLN
0.0000 [IU] | Freq: Three times a day (TID) | INTRAMUSCULAR | Status: DC
  Administered 2023-07-25: 3 [IU] via SUBCUTANEOUS
  Administered 2023-07-25 – 2023-07-26 (×2): 1 [IU] via SUBCUTANEOUS
  Filled 2023-07-24 (×3): qty 1

## 2023-07-24 MED ORDER — APIXABAN 2.5 MG PO TABS
2.5000 mg | ORAL_TABLET | Freq: Two times a day (BID) | ORAL | Status: DC
  Administered 2023-07-24 – 2023-07-26 (×4): 2.5 mg via ORAL
  Filled 2023-07-24 (×4): qty 1

## 2023-07-24 MED ORDER — FAMOTIDINE 20 MG PO TABS
20.0000 mg | ORAL_TABLET | Freq: Every day | ORAL | Status: DC
  Administered 2023-07-24 – 2023-07-25 (×2): 20 mg via ORAL
  Filled 2023-07-24 (×2): qty 1

## 2023-07-24 MED ORDER — EZETIMIBE 10 MG PO TABS
10.0000 mg | ORAL_TABLET | Freq: Every day | ORAL | Status: DC
  Administered 2023-07-25 – 2023-07-26 (×2): 10 mg via ORAL
  Filled 2023-07-24 (×2): qty 1

## 2023-07-24 MED ORDER — FUROSEMIDE 10 MG/ML IJ SOLN
40.0000 mg | Freq: Two times a day (BID) | INTRAMUSCULAR | Status: DC
  Administered 2023-07-24 – 2023-07-26 (×4): 40 mg via INTRAVENOUS
  Filled 2023-07-24 (×4): qty 4

## 2023-07-24 MED ORDER — SODIUM CHLORIDE 0.9% FLUSH
3.0000 mL | INTRAVENOUS | Status: DC | PRN
Start: 2023-07-24 — End: 2023-07-26

## 2023-07-24 MED ORDER — TAMSULOSIN HCL 0.4 MG PO CAPS
0.4000 mg | ORAL_CAPSULE | Freq: Every day | ORAL | Status: DC
  Administered 2023-07-24 – 2023-07-26 (×3): 0.4 mg via ORAL
  Filled 2023-07-24 (×3): qty 1

## 2023-07-24 MED ORDER — TRAZODONE HCL 50 MG PO TABS
50.0000 mg | ORAL_TABLET | Freq: Every evening | ORAL | Status: DC | PRN
  Administered 2023-07-24 – 2023-07-25 (×2): 50 mg via ORAL
  Filled 2023-07-24 (×2): qty 1

## 2023-07-24 MED ORDER — ISOSORBIDE MONONITRATE ER 30 MG PO TB24
30.0000 mg | ORAL_TABLET | Freq: Every day | ORAL | Status: DC
  Administered 2023-07-24 – 2023-07-25 (×2): 30 mg via ORAL
  Filled 2023-07-24 (×2): qty 1

## 2023-07-24 MED ORDER — ISOSORBIDE MONONITRATE ER 60 MG PO TB24: 60.0000 mg | ORAL_TABLET | Freq: Every day | ORAL | Status: DC

## 2023-07-24 MED ORDER — FUROSEMIDE 10 MG/ML IJ SOLN: 40.0000 mg | Freq: Two times a day (BID) | INTRAMUSCULAR | Status: DC

## 2023-07-24 MED ORDER — FUROSEMIDE 10 MG/ML IJ SOLN
20.0000 mg | Freq: Once | INTRAMUSCULAR | Status: AC
  Administered 2023-07-24: 20 mg via INTRAVENOUS
  Filled 2023-07-24: qty 4

## 2023-07-24 MED ORDER — ISOSORBIDE MONONITRATE ER 60 MG PO TB24
60.0000 mg | ORAL_TABLET | Freq: Every morning | ORAL | Status: DC
  Administered 2023-07-25 – 2023-07-26 (×2): 60 mg via ORAL
  Filled 2023-07-24 (×2): qty 1

## 2023-07-24 MED ORDER — GLIPIZIDE ER 2.5 MG PO TB24
2.5000 mg | ORAL_TABLET | Freq: Every day | ORAL | Status: DC
  Administered 2023-07-25 – 2023-07-26 (×2): 2.5 mg via ORAL
  Filled 2023-07-24 (×2): qty 1

## 2023-07-24 MED ORDER — HYDRALAZINE HCL 50 MG PO TABS
100.0000 mg | ORAL_TABLET | Freq: Three times a day (TID) | ORAL | Status: DC
  Administered 2023-07-24 – 2023-07-26 (×6): 100 mg via ORAL
  Filled 2023-07-24 (×6): qty 2

## 2023-07-24 MED ORDER — LOSARTAN POTASSIUM 50 MG PO TABS
100.0000 mg | ORAL_TABLET | Freq: Every day | ORAL | Status: DC
  Filled 2023-07-24: qty 2

## 2023-07-24 MED ORDER — DAPAGLIFLOZIN PROPANEDIOL 10 MG PO TABS
10.0000 mg | ORAL_TABLET | Freq: Every day | ORAL | Status: DC
  Administered 2023-07-25 – 2023-07-26 (×2): 10 mg via ORAL
  Filled 2023-07-24 (×2): qty 1

## 2023-07-24 MED ORDER — ACETAMINOPHEN 500 MG PO TABS: 1000.0000 mg | ORAL_TABLET | Freq: Four times a day (QID) | ORAL | Status: DC | PRN

## 2023-07-24 MED ORDER — LINAGLIPTIN 5 MG PO TABS
5.0000 mg | ORAL_TABLET | Freq: Every evening | ORAL | Status: DC
  Administered 2023-07-24 – 2023-07-25 (×2): 5 mg via ORAL
  Filled 2023-07-24 (×4): qty 1

## 2023-07-24 MED ORDER — ONDANSETRON HCL 4 MG/2ML IJ SOLN: 4.0000 mg | Freq: Four times a day (QID) | INTRAMUSCULAR | Status: DC | PRN

## 2023-07-24 MED ORDER — SPIRONOLACTONE 12.5 MG HALF TABLET
12.5000 mg | ORAL_TABLET | Freq: Every day | ORAL | Status: DC
  Administered 2023-07-24 – 2023-07-26 (×3): 12.5 mg via ORAL
  Filled 2023-07-24 (×3): qty 1

## 2023-07-24 MED ORDER — LOSARTAN POTASSIUM 50 MG PO TABS: 100.0000 mg | ORAL_TABLET | Freq: Every day | ORAL | Status: DC

## 2023-07-24 MED ORDER — SODIUM CHLORIDE 0.9 % IV SOLN: 250.0000 mL | INTRAVENOUS | Status: AC | PRN

## 2023-07-24 NOTE — ED Provider Notes (Signed)
 [facility] Provider Note    Event Date/Time   First MD Initiated Contact with Patient 07/24/23 1004     (approximate)   History   Bradycardia, Hypertension, and Shortness of Breath   HPI  Todd Valentine is a 83 y.o. male with history of heart failure, CAD, atrial fibrillation on Eliquis , diabetes, presenting with dyspnea on exertion as well as orthopnea.  Reporting low heart rates to the high 30s that is new for him.  Denies any chest pain or cough, no fevers.  Does state that when he walks around, he is lightheaded.  Per independent history from EMS, they noted heart rates in the 40s, he had told them that when he last had bradycardia and the symptoms, he needed to be admitted for diuresis.   On independent chart review, he called his cardiologist today who sent him in since his heart rate was 38.  He was also evaluated by his primary care doctor in February, does have history of CKD, secondary hyperparathyroidism, his last echo in February showed EF of 60-65%.  Physical Exam   Triage Vital Signs: ED Triage Vitals  Encounter Vitals Group     BP      Systolic BP Percentile      Diastolic BP Percentile      Pulse      Resp      Temp      Temp src      SpO2      Weight      Height      Head Circumference      Peak Flow      Pain Score      Pain Loc      Pain Education      Exclude from Growth Chart     Most recent vital signs: Vitals:   07/24/23 1009 07/24/23 1030  BP: (!) 182/65 (!) 183/67  Pulse: (!) 40 (!) 40  Resp: 20 16  Temp: 97.7 F (36.5 C)   SpO2: 96% 98%     General: Awake, no distress.  CV:  Good peripheral perfusion.  Resp:  Normal effort.  Clear Abd:  No distention.  Soft nontender Other:  Bilateral lower extremity edema without unilateral calf swelling or tenderness   ED Results / Procedures / Treatments   Labs (all labs ordered are listed, but only abnormal results are displayed) Labs Reviewed   COMPREHENSIVE METABOLIC PANEL WITH GFR - Abnormal; Notable for the following components:      Result Value   Glucose, Bld 149 (*)    BUN 30 (*)    Creatinine, Ser 1.80 (*)    Calcium  8.8 (*)    Total Bilirubin 1.6 (*)    GFR, Estimated 37 (*)    All other components within normal limits  BRAIN NATRIURETIC PEPTIDE - Abnormal; Notable for the following components:   B Natriuretic Peptide 415.0 (*)    All other components within normal limits  CBC WITH DIFFERENTIAL/PLATELET - Abnormal; Notable for the following components:   Platelets 124 (*)    All other components within normal limits  MAGNESIUM   PHOSPHORUS  TROPONIN I (HIGH SENSITIVITY)  TROPONIN I (HIGH SENSITIVITY)     EKG  EKG shows, atrial fibrillation, rate 38, widened QRS, normal QTc, right bundle branch block that is chronic, no ischemia ST elevation, T wave flattening to lateral leads, T wave changes any compared to prior   RADIOLOGY On my independent interpretation, chest x-ray  without obvious consolidation   PROCEDURES:  Critical Care performed: No  Procedures   MEDICATIONS ORDERED IN ED: Medications  furosemide  (LASIX ) injection 20 mg (20 mg Intravenous Given 07/24/23 1143)     IMPRESSION / MDM / ASSESSMENT AND PLAN / ED COURSE  I reviewed the triage vital signs and the nursing notes.                              Differential diagnosis includes, but is not limited to, CHF exacerbation, volume overload, electrolyte derangements, arrhythmia, ACS.  Will get labs, EKG, troponin, chest x-ray.  He will likely need to be admitted for further management and telemetry.  Patient's presentation is most consistent with acute presentation with potential threat to life or bodily function.  Independent interpretation of labs and imaging below.  Given his bradycardia as well as fluid overload, will give him a dose of IV Lasix  here and have him admitted.  Consult to hospitalist was agreeable with the plan for admission  and will evaluate the patient.  He is admitted.  The patient is on the cardiac monitor to evaluate for evidence of arrhythmia and/or significant heart rate changes.   Clinical Course as of 07/24/23 1146  Fri Jul 24, 2023  1128 DG Chest 1 View Pulmonary vascular congestion without overt pulmonary edema.  [TT]  1135 Independent review of labs, phosphorus and mag is not elevated, troponin is not elevated, BNP is elevated, no leukocytosis, electrolytes not severely deranged, his creatinine is elevated compared to prior. [TT]    Clinical Course User Index [TT] Drenda Gentle, Richard Champion, MD     FINAL CLINICAL IMPRESSION(S) / ED DIAGNOSES   Final diagnoses:  Acute on chronic congestive heart failure, unspecified heart failure type ([name])  Orthopnea  DOE (dyspnea on exertion)  Bradycardia     Rx / DC Orders   ED Discharge Orders     None        Note:  This document was prepared using Dragon voice recognition software and may include unintentional dictation errors.    Shane Darling, MD 07/24/23 1146

## 2023-07-24 NOTE — Telephone Encounter (Signed)
 Spoke with patient's daughter who stated that she contacted EMS due to patient's HR being too low with SOB. She reported pt is currently enroute to [facility] ED for further evaluation.  Nurse will route message to Dr. Gollan to make him aware.

## 2023-07-24 NOTE — Plan of Care (Signed)
   Problem: Education: Goal: Knowledge of General Education information will improve Description Including pain rating scale, medication(s)/side effects and non-pharmacologic comfort measures Outcome: Progressing

## 2023-07-24 NOTE — Telephone Encounter (Signed)
 STAT if HR is under 50 or over 120  (normal HR is 60-100 beats per minute)  What is your heart rate? Hr 38 bp 158/71   Do you have a log of your heart rate readings (document readings)? Only took today, hr 38   Do you have any other symptoms? SOB  Pt daughter had called in stating pt hr was 38. I asked her to add the pt to the call to ask more about his symptoms. Pt states he was having some Sob with exertion today. He called EMS prior to getting on the call and they are currently on the way to him. Sending note for documentation purposes (FYI).

## 2023-07-24 NOTE — ED Triage Notes (Signed)
 Pt to ED via [facility] from home for c/o hypertension, bradycardia, shortness of breath at night. Pt states unable to lay down due to shortness of breath. Hx Afib  BP 150/80 Cbg 136 97% room air HR 40s

## 2023-07-24 NOTE — ED Notes (Signed)
Informed RN bed assigned 

## 2023-07-24 NOTE — H&P (Signed)
 History and Physical    LONNEY REVAK ZOX:[id number] DOB: 1940/12/17 DOA: 07/24/2023  PCP: [facility] (Confirm with patient/family/NH records and if not entered, this has to be entered at [facility] point of entry) Patient coming from: Home  I have personally briefly reviewed patient's old medical records in [facility]  Chief Complaint: SOB, heart rate low  HPI: Todd Valentine is a 83 y.o. male with medical history significant of HTN, chronic HFpEF, IIDM, chronic A-fib on Eliquis , CAD, CKD stage IIIa, BPH, cigarette smoking, presented with worsening of shortness of breath.  Symptoms started to have exertional dyspnea 3 days ago, gradually getting worse, he has been checking his blood pressure more often and found the blood pressure has been elevated yesterday he found SBP in the 180s heart rate in 40s.  Overnight however he could not no longer lie flat because of shortness of breath and could not sleep at all.  Denies any chest pain no cough no leg swelling.  This morning he found his blood pressure in 190s and heart rate 38 and family called EMS.  ED Course: Afebrile, heart rate 40 blood pressure 180/65 O2 saturation 95% on room air.  Chest x-ray showed pulmonary congestion.  Blood work showed creatinine 1.8 compared to baseline 1.5-1.6 K4.6, bicarb 22 WBC 6.2 hemoglobin 14.  Troponin negative x 1, EKG showed rate controlled A-fib bradycardia no ST changes. Patient was given IV Lasix  20 mg x 1 in the ED.  Review of Systems: As per HPI otherwise 14 point review of systems negative.   Past Medical History:  Diagnosis Date   Acute renal failure ([name])    BPH (benign prostatic hyperplasia)    Chronic atrial fibrillation ([name])    Chronic diastolic CHF (congestive heart failure) ([name])    a. echo as above   Chronic insomnia    Coronary artery disease, non-occlusive 10/06/2013   a. cath 09/2013: pLAD 40, CTO Diag, ramus 40-->80, OM 50 mRCA 40, med rx   Depression    Diabetes  mellitus with complication ([name])    Diverticulitis of colon    Dysrhythmia    atrial fibrillation   Heart murmur    systolic murmur, unspecified   History of diverticulitis    Hypercholesterolemia    Hyperkalemia    Hyperlipidemia    Hyperlipidemia    Hypertension    Hypokalemia    Hypomagnesemia    Malignant melanoma ([name])    Melanoma ([name])    Mitral regurgitation    a. echo 09/2013: EF 55-60%, mildly dilated left atrrium, mild to moderate MR/TR   Sleep apnea in adult     Past Surgical History:  Procedure Laterality Date   APPENDECTOMY     CARDIAC CATHETERIZATION  10/06/2013   COLONOSCOPY     COLONOSCOPY WITH PROPOFOL  N/A 11/23/2014   Procedure: COLONOSCOPY WITH PROPOFOL ;  Surgeon: Cassie Click, MD;  Location: [facility] ENDOSCOPY;  Service: Endoscopy;  Laterality: N/A;   COLONOSCOPY WITH PROPOFOL  N/A 04/11/2020   Procedure: COLONOSCOPY WITH PROPOFOL ;  Surgeon: Toledo, Alphonsus Jeans, MD;  Location: [facility] ENDOSCOPY;  Service: Gastroenterology;  Laterality: N/A;  DM  ELIQUIS    MOHS SURGERY     removal tubular adenoma     RENAL ANGIOGRAPHY Right 11/13/2021   Procedure: RENAL ANGIOGRAPHY;  Surgeon: Celso College, MD;  Location: [facility] INVASIVE CV LAB;  Service: Cardiovascular;  Laterality: Right;     reports that he quit smoking about 36 years ago. His smoking use included cigarettes.  He started smoking about 66 years ago. He has a 90 pack-year smoking history. He has never used smokeless tobacco. He reports current alcohol use. He reports that he does not currently use drugs.  Allergies  Allergen Reactions   Hydrocodone -Acetaminophen  Other (See Comments)   Amlodipine  Swelling    Leg swelling on 10 mg   Aspirin Other (See Comments)    Contraindicated - Kidney disease Other reaction(s): Other (See Comments), Other (See Comments) Contraindicated - Kidney disease Contraindicated - Kidney disease   Ciprofloxacin Other (See Comments)    Other reaction(s): Pt states he could hardly  move Other reaction(s): Other (See Comments), Other (See Comments) Pt states he could hardly move Other reaction(s): Other (See Comments) Pt states he could hardly move Other reaction(s): Pt states he could hardly move Pt states he could hardly move   Sitagliptin Other (See Comments)    Other reaction(s): Weakness  Other reaction(s): Other (See Comments), Other (See Comments) Weakness Other reaction(s): Other (See Comments) Weakness Other reaction(s): Weakness Weakness    Family History  Family history unknown: Yes     Prior to Admission medications   Medication Sig Start Date End Date Taking? Authorizing Provider  acetaminophen  (TYLENOL ) 500 MG tablet Take 1,000 mg by mouth every 6 (six) hours as needed for mild pain, fever or headache.     [provider]  Alirocumab  (PRALUENT ) 75 MG/ML SOAJ Inject 1 mL (75 mg total) into the skin every 14 (fourteen) days. 06/02/23   Gollan, Timothy J, MD  apixaban  (ELIQUIS ) 2.5 MG TABS tablet Take 1 tablet (2.5 mg total) by mouth 2 (two) times daily. 07/10/23   Gollan, Timothy J, MD  carvedilol  (COREG ) 25 MG tablet Take 0.5 tablets (12.5 mg total) by mouth 2 (two) times daily with a meal. 02/04/23   Gollan, Deadra Everts, MD  doxazosin  (CARDURA ) 8 MG tablet Take 1 tablet (8 mg total) by mouth at bedtime. 10/17/22   Gollan, Timothy J, MD  ezetimibe  (ZETIA ) 10 MG tablet Take 1 tablet (10 mg total) by mouth daily. 10/17/22   Gollan, Timothy J, MD  famotidine  (PEPCID ) 20 MG tablet Take 1 tablet (20 mg total) by mouth at bedtime. 05/19/16   Cher Cordial, MD  FARXIGA  10 MG TABS tablet Take 1 tablet (10 mg total) by mouth daily. 10/17/22   Gollan, Timothy J, MD  furosemide  (LASIX ) 20 MG tablet TAKE 1 TABLET BY MOUTH ONCE DAILY EXCEPT  ON  SATURDAYS  AND  SUNDAYS 01/21/23   Gollan, Timothy J, MD  glipiZIDE  (GLUCOTROL  XL) 5 MG 24 hr tablet Take 5 mg by mouth daily with breakfast. 11/14/20   [provider]  hydrALAZINE  (APRESOLINE ) 100 MG tablet  Take 1 tablet (100 mg total) by mouth every 8 (eight) hours. 10/17/22   Gollan, Timothy J, MD  isosorbide  mononitrate (IMDUR ) 60 MG 24 hr tablet Take 60 mg one tablet in the am, one-half tablet (30 mg) in the pm and one-half tablet (30 mg) at bedtime. 07/10/23   Gollan, Timothy J, MD  linagliptin  (TRADJENTA ) 5 MG TABS tablet Take 5 mg by mouth daily.    [provider]  losartan  (COZAAR ) 100 MG tablet Take 1 tablet (100 mg total) by mouth daily. 10/17/22   Gollan, Timothy J, MD  magnesium  oxide (MAG-OX) 400 (241.3 Mg) MG tablet Take 1 tablet by mouth daily. 09/05/18   [provider]  nitroGLYCERIN  (NITROSTAT ) 0.4 MG SL tablet Place 1 tablet (0.4 mg total) under the tongue every 5 (five)  minutes as needed for chest pain. 12/10/20   Gollan, Timothy J, MD  Potassium Chloride  ER 20 MEQ TBCR Take 1 tablet by mouth once daily 12/30/22   Gollan, Timothy J, MD  tamsulosin  (FLOMAX ) 0.4 MG CAPS capsule Take 0.4 mg by mouth daily.  12/10/13   [provider]  traZODone  (DESYREL ) 50 MG tablet Take 50 mg by mouth at bedtime as needed for sleep.    [provider]    Physical Exam: Vitals:   07/24/23 1009 07/24/23 1013 07/24/23 1030  BP: (!) 182/65  (!) 183/67  Pulse: (!) 40  (!) 40  Resp: 20  16  Temp: 97.7 F (36.5 C)    TempSrc: Oral    SpO2: 96%  98%  Weight:  94.3 kg   Height:  5\' 8"  (1.727 m)     Constitutional: NAD, calm, comfortable Vitals:   07/24/23 1009 07/24/23 1013 07/24/23 1030  BP: (!) 182/65  (!) 183/67  Pulse: (!) 40  (!) 40  Resp: 20  16  Temp: 97.7 F (36.5 C)    TempSrc: Oral    SpO2: 96%  98%  Weight:  94.3 kg   Height:  5\' 8"  (1.727 m)    Eyes: PERRL, lids and conjunctivae normal ENMT: Mucous membranes are moist. Posterior pharynx clear of any exudate or lesions.Normal dentition.  Neck: normal, supple, no masses, no thyromegaly Respiratory: clear to auscultation bilaterally, no wheezing, fine crackles to bilateral mid levels, increasing  respiratory effort. No accessory muscle use.  Cardiovascular: Regular rate and rhythm, no murmurs / rubs / gallops. No extremity edema. 2+ pedal pulses. No carotid bruits.  Abdomen: no tenderness, no masses palpated. No hepatosplenomegaly. Bowel sounds positive.  Musculoskeletal: no clubbing / cyanosis. No joint deformity upper and lower extremities. Good ROM, no contractures. Normal muscle tone.  Skin: no rashes, lesions, ulcers. No induration Neurologic: CN 2-12 grossly intact. Sensation intact, DTR normal. Strength 5/5 in all 4.  Psychiatric: Normal judgment and insight. Alert and oriented x 3. Normal mood.     Labs on Admission: I have personally reviewed following labs and imaging studies  CBC: Recent Labs  Lab 07/24/23 1023  WBC 6.2  NEUTROABS 4.7  HGB 14.4  HCT 44.6  MCV 92.9  PLT 124*   Basic Metabolic Panel: Recent Labs  Lab 07/24/23 1023  NA 137  K 4.6  CL 106  CO2 22  GLUCOSE 149*  BUN 30*  CREATININE 1.80*  CALCIUM  8.8*  MG 2.2  PHOS 3.4   GFR: Estimated Creatinine Clearance: 35.3 mL/min (A) (by C-G formula based on SCr of 1.8 mg/dL (H)). Liver Function Tests: Recent Labs  Lab 07/24/23 1023  AST 22  ALT 20  ALKPHOS 77  BILITOT 1.6*  PROT 6.6  ALBUMIN 3.6   No results for input(s): "LIPASE", "AMYLASE" in the last 168 hours. No results for input(s): "AMMONIA" in the last 168 hours. Coagulation Profile: No results for input(s): "INR", "PROTIME" in the last 168 hours. Cardiac Enzymes: No results for input(s): "CKTOTAL", "CKMB", "CKMBINDEX", "TROPONINI" in the last 168 hours. BNP (last 3 results) No results for input(s): "PROBNP" in the last 8760 hours. HbA1C: No results for input(s): "HGBA1C" in the last 72 hours. CBG: No results for input(s): "GLUCAP" in the last 168 hours. Lipid Profile: No results for input(s): "CHOL", "HDL", "LDLCALC", "TRIG", "CHOLHDL", "LDLDIRECT" in the last 72 hours. Thyroid Function Tests: No results for input(s):  "TSH", "T4TOTAL", "FREET4", "T3FREE", "THYROIDAB" in the last 72 hours. Anemia  Panel: No results for input(s): "VITAMINB12", "FOLATE", "FERRITIN", "TIBC", "IRON", "RETICCTPCT" in the last 72 hours. Urine analysis: No results found for: "COLORURINE", "APPEARANCEUR", "LABSPEC", "PHURINE", "GLUCOSEU", "HGBUR", "BILIRUBINUR", "KETONESUR", "PROTEINUR", "UROBILINOGEN", "NITRITE", "LEUKOCYTESUR"  Radiological Exams on Admission: DG Chest 1 View Result Date: 07/24/2023 CLINICAL DATA:  Shortness of breath. EXAM: CHEST  1 VIEW COMPARISON:  11/07/2021 FINDINGS: The lungs are clear without focal pneumonia, edema, pneumothorax or pleural effusion. There is pulmonary vascular congestion without overt pulmonary edema. Cardiopericardial silhouette is at upper limits of normal for size. No acute bony abnormality. Telemetry leads overlie the chest. IMPRESSION: Pulmonary vascular congestion without overt pulmonary edema. Electronically Signed   By: Donnal Fusi M.D.   On: 07/24/2023 11:11    EKG: Independently reviewed.  Rate controlled A-fib, bradycardia, no acute ST changes.  Assessment/Plan Principal Problem:   CHF (congestive heart failure) ([name]) Active Problems:   Acute on chronic diastolic CHF (congestive heart failure) ([name])   Bradycardia  (please populate well all problems here in Problem List. (For example, if patient is on BP meds at home and you resume or decide to hold them, it is a problem that needs to be her. Same for CAD, COPD, HLD and so on)  Acute on chronic HFpEF decompensation - Likely secondary to uncontrolled hypertension - IV Lasix  40 mg twice daily - Monitor kidney function - Echocardiogram was done 3 months ago, will not repeat at this point - Check TSH  HTN emergency - With acute endorgan damage of acute decompensated CHF - Hold off Coreg  given there is a significant bradycardia, continue hydralazine  and Imdur  and losartan , consider add additional BP meds. - Continue IV  Lasix   Chronic A-fib with bradycardia - Hold off Coreg  - Continue Eliquis   IIDM - Continue glimepiride , Januvia and Jardiance - SSI  CKD stage IIIa - Fluid overload, creatinine level above baseline, IV diuresis as above  BPH - No complaints, continue Flomax  and at bedtime doxazosin   Deconditioning - PT evaluation  DVT prophylaxis: Eliquis  Code Status: Full code Family Communication: Wife and daughter at bedside Disposition Plan: Expect less than 2 midnight hospital stay Consults called: None Admission status: Telemetry observation   [place] MD [facility] Pager [phone]  07/24/2023, 12:44 PM

## 2023-07-24 NOTE — ED Notes (Signed)
 CCMD called to monitor patient, made aware of bradycardia and hx of Afib

## 2023-07-25 ENCOUNTER — Observation Stay

## 2023-07-25 DIAGNOSIS — E118 Type 2 diabetes mellitus with unspecified complications: Secondary | ICD-10-CM

## 2023-07-25 DIAGNOSIS — Z85820 Personal history of malignant melanoma of skin: Secondary | ICD-10-CM | POA: Diagnosis not present

## 2023-07-25 DIAGNOSIS — Z7901 Long term (current) use of anticoagulants: Secondary | ICD-10-CM | POA: Diagnosis not present

## 2023-07-25 DIAGNOSIS — N1831 Chronic kidney disease, stage 3a: Secondary | ICD-10-CM | POA: Diagnosis present

## 2023-07-25 DIAGNOSIS — Z87891 Personal history of nicotine dependence: Secondary | ICD-10-CM | POA: Diagnosis not present

## 2023-07-25 DIAGNOSIS — I160 Hypertensive urgency: Secondary | ICD-10-CM | POA: Diagnosis present

## 2023-07-25 DIAGNOSIS — N2581 Secondary hyperparathyroidism of renal origin: Secondary | ICD-10-CM | POA: Diagnosis present

## 2023-07-25 DIAGNOSIS — I10 Essential (primary) hypertension: Secondary | ICD-10-CM | POA: Diagnosis not present

## 2023-07-25 DIAGNOSIS — E7800 Pure hypercholesterolemia, unspecified: Secondary | ICD-10-CM | POA: Diagnosis present

## 2023-07-25 DIAGNOSIS — Z886 Allergy status to analgesic agent status: Secondary | ICD-10-CM | POA: Diagnosis not present

## 2023-07-25 DIAGNOSIS — I161 Hypertensive emergency: Secondary | ICD-10-CM | POA: Diagnosis present

## 2023-07-25 DIAGNOSIS — N400 Enlarged prostate without lower urinary tract symptoms: Secondary | ICD-10-CM

## 2023-07-25 DIAGNOSIS — E876 Hypokalemia: Secondary | ICD-10-CM | POA: Diagnosis present

## 2023-07-25 DIAGNOSIS — R0609 Other forms of dyspnea: Secondary | ICD-10-CM | POA: Diagnosis not present

## 2023-07-25 DIAGNOSIS — I2510 Atherosclerotic heart disease of native coronary artery without angina pectoris: Secondary | ICD-10-CM | POA: Diagnosis present

## 2023-07-25 DIAGNOSIS — F5104 Psychophysiologic insomnia: Secondary | ICD-10-CM | POA: Diagnosis present

## 2023-07-25 DIAGNOSIS — Z885 Allergy status to narcotic agent status: Secondary | ICD-10-CM | POA: Diagnosis not present

## 2023-07-25 DIAGNOSIS — N179 Acute kidney failure, unspecified: Secondary | ICD-10-CM | POA: Diagnosis present

## 2023-07-25 DIAGNOSIS — F32A Depression, unspecified: Secondary | ICD-10-CM | POA: Diagnosis present

## 2023-07-25 DIAGNOSIS — I482 Chronic atrial fibrillation, unspecified: Secondary | ICD-10-CM | POA: Diagnosis present

## 2023-07-25 DIAGNOSIS — Z7984 Long term (current) use of oral hypoglycemic drugs: Secondary | ICD-10-CM | POA: Diagnosis not present

## 2023-07-25 DIAGNOSIS — R001 Bradycardia, unspecified: Secondary | ICD-10-CM | POA: Diagnosis present

## 2023-07-25 DIAGNOSIS — E1122 Type 2 diabetes mellitus with diabetic chronic kidney disease: Secondary | ICD-10-CM | POA: Diagnosis present

## 2023-07-25 DIAGNOSIS — N183 Chronic kidney disease, stage 3 unspecified: Secondary | ICD-10-CM

## 2023-07-25 DIAGNOSIS — Z881 Allergy status to other antibiotic agents status: Secondary | ICD-10-CM | POA: Diagnosis not present

## 2023-07-25 DIAGNOSIS — I5033 Acute on chronic diastolic (congestive) heart failure: Secondary | ICD-10-CM | POA: Diagnosis present

## 2023-07-25 DIAGNOSIS — R0601 Orthopnea: Secondary | ICD-10-CM | POA: Diagnosis present

## 2023-07-25 DIAGNOSIS — I509 Heart failure, unspecified: Secondary | ICD-10-CM | POA: Diagnosis not present

## 2023-07-25 DIAGNOSIS — I130 Hypertensive heart and chronic kidney disease with heart failure and stage 1 through stage 4 chronic kidney disease, or unspecified chronic kidney disease: Secondary | ICD-10-CM | POA: Diagnosis present

## 2023-07-25 DIAGNOSIS — G4730 Sleep apnea, unspecified: Secondary | ICD-10-CM | POA: Diagnosis present

## 2023-07-25 LAB — GLUCOSE, CAPILLARY
Glucose-Capillary: 106 mg/dL — ABNORMAL HIGH (ref 70–99)
Glucose-Capillary: 228 mg/dL — ABNORMAL HIGH (ref 70–99)
Glucose-Capillary: 122 mg/dL — ABNORMAL HIGH (ref 70–99)
Glucose-Capillary: 140 mg/dL — ABNORMAL HIGH (ref 70–99)

## 2023-07-25 LAB — BASIC METABOLIC PANEL WITH GFR
Anion gap: 8 (ref 5–15)
BUN: 28 mg/dL — ABNORMAL HIGH (ref 8–23)
CO2: 25 mmol/L (ref 22–32)
Calcium: 8.7 mg/dL — ABNORMAL LOW (ref 8.9–10.3)
Chloride: 106 mmol/L (ref 98–111)
Creatinine, Ser: 1.67 mg/dL — ABNORMAL HIGH (ref 0.61–1.24)
GFR, Estimated: 41 mL/min — ABNORMAL LOW (ref >60)
Glucose, Bld: 104 mg/dL — ABNORMAL HIGH (ref 70–99)
Potassium: 3.5 mmol/L (ref 3.5–5.1)
Sodium: 139 mmol/L (ref 135–145)

## 2023-07-25 MED ORDER — INSULIN ASPART 100 UNIT/ML IJ SOLN
5.0000 [IU] | Freq: Three times a day (TID) | INTRAMUSCULAR | Status: DC
  Administered 2023-07-25 – 2023-07-26 (×2): 5 [IU] via SUBCUTANEOUS
  Filled 2023-07-25 (×2): qty 1

## 2023-07-25 MED ORDER — POTASSIUM CHLORIDE CRYS ER 20 MEQ PO TBCR
40.0000 meq | EXTENDED_RELEASE_TABLET | Freq: Once | ORAL | Status: AC
  Administered 2023-07-25: 40 meq via ORAL
  Filled 2023-07-25: qty 2

## 2023-07-25 NOTE — Assessment & Plan Note (Signed)
 Postprandial increase in CBG with A1c of 7. - Continuing home glipizide , Farxiga  and Tradjenta  - SSI - Adding 5 units with meal

## 2023-07-25 NOTE — Assessment & Plan Note (Signed)
-   Continue with Flomax  and doxazosin

## 2023-07-25 NOTE — Care Management Obs Status (Signed)
 MEDICARE OBSERVATION STATUS NOTIFICATION   Patient Details  Name: Todd Valentine MRN: [id number] Date of Birth: 10-25-1940   Medicare Observation Status Notification Given:  Yes    Anise Kerns 07/25/2023, 12:53 PM

## 2023-07-25 NOTE — Plan of Care (Signed)
   Problem: Education: Goal: Ability to describe self-care measures that may prevent or decrease complications (Diabetes Survival Skills Education) will improve Outcome: Progressing   Problem: Fluid Volume: Goal: Ability to maintain a balanced intake and output will improve Outcome: Progressing   Problem: Metabolic: Goal: Ability to maintain appropriate glucose levels will improve Outcome: Progressing   Problem: Nutritional: Goal: Maintenance of adequate nutrition will improve Outcome: Progressing   Problem: Skin Integrity: Goal: Risk for impaired skin integrity will decrease Outcome: Progressing

## 2023-07-25 NOTE — Assessment & Plan Note (Signed)
 Patient heart rate improved. - Keep holding Coreg 
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
## 2023-07-25 NOTE — Plan of Care (Signed)
  Problem: Coping: Goal: Ability to adjust to condition or change in health will improve Outcome: Progressing   Problem: Fluid Volume: Goal: Ability to maintain a balanced intake and output will improve Outcome: Progressing   Problem: Health Behavior/Discharge Planning: Goal: Ability to manage health-related needs will improve Outcome: Progressing   Problem: Metabolic: Goal: Ability to maintain appropriate glucose levels will improve Outcome: Progressing   Problem: Nutritional: Goal: Maintenance of adequate nutrition will improve Outcome: Progressing   Problem: Skin Integrity: Goal: Risk for impaired skin integrity will decrease Outcome: Progressing   Problem: Tissue Perfusion: Goal: Adequacy of tissue perfusion will improve Outcome: Progressing   Problem: Education: Goal: Knowledge of General Education information will improve Description: Including pain rating scale, medication(s)/side effects and non-pharmacologic comfort measures Outcome: Progressing   Problem: Clinical Measurements: Goal: Ability to maintain clinical measurements within normal limits will improve Outcome: Progressing Goal: Will remain free from infection Outcome: Progressing Goal: Respiratory complications will improve Outcome: Progressing Goal: Cardiovascular complication will be avoided Outcome: Progressing   Problem: Nutrition: Goal: Adequate nutrition will be maintained Outcome: Progressing   Problem: Coping: Goal: Level of anxiety will decrease Outcome: Progressing   Problem: Elimination: Goal: Will not experience complications related to urinary retention Outcome: Progressing   Problem: Safety: Goal: Ability to remain free from injury will improve Outcome: Progressing   Problem: Skin Integrity: Goal: Risk for impaired skin integrity will decrease Outcome: Progressing

## 2023-07-25 NOTE — Progress Notes (Signed)
 Progress Note   Patient: Todd Valentine ZOX:[id number] DOB: 29-May-1940 DOA: 07/24/2023     0 DOS: the patient was seen and examined on 07/25/2023   Brief hospital course: Taken from H&P.  Todd Valentine is a 83 y.o. male with medical history significant of HTN, chronic HFpEF, IIDM, chronic A-fib on Eliquis , CAD, CKD stage IIIa, BPH, cigarette smoking, presented with worsening of shortness of breath for 3 days. Slowly worsening and not developed orthopnea.  No leg edema or chest pain.  EMS found elevated blood pressure with systolic in 190s and bradycardia with heart rate in 38, on presentation to ED heart rate was 40 with blood pressure of 180/65, saturating well on room air.  Chest x-ray with pulmonary congestion.  Labs with mild AKI with creatinine at 1.8 baseline around 1.5, bicarb 22, troponin negative.  EKG with A-fib, bradycardia no ST changes.  Patient was started on IV diuresis.  Home Coreg  was held due to bradycardia.  5/31: Blood pressure mildly elevated at 153/74, UOP of 2500, creatinine with some improvement to 1.67, A1c of 7, TSH normal at 2.983.  Potassium borderline at 3.5 so replacing some potassium as he is getting IV Lasix . Likely will switch to torsemide on discharge.  Giving 1 more day of IV diuresis PT/OT with no follow-up recommendations.   Assessment and Plan: * Acute on chronic diastolic CHF (congestive heart failure) ([name]) Echocardiogram done in February 2025 with normal EF, indeterminate diastolic function.  Elevated BNP at 415 and pulmonary vascular congestion on imaging. Seems improving as he is now able to lay down. Renal functions with some improvement in creatinine. - Continue with IV Lasix  40 mg twice daily-likely will discharge on torsemide -Strict intake and output -Daily weight and BMP  Bradycardia Patient heart rate improved. - Keep holding Coreg   Essential hypertension Patient presented with significantly elevated blood pressure and concern of  hypertensive urgency. - Holding home Coreg  due to significant bradycardia -Continuing home hydralazine , Imdur , losartan  -Continue with IV Lasix   Chronic kidney disease, stage III (moderate) ([name]) Mild AKI on admission which seems improving with diuresis. Creatinine improved to 1.67, baseline around 1.5 - Monitor renal function while patient is being diuresed -Avoid nephrotoxins  Diabetes mellitus with complication ([name]) Postprandial increase in CBG with A1c of 7. - Continuing home glipizide , Farxiga  and Tradjenta  - SSI - Adding 5 units with meal  BPH (benign prostatic hyperplasia) - Continue with Flomax  and doxazosin    Subjective: Patient was feeling much improved as compared to the yesterday when seen today.  He was able to lay down now.  Daughters at bedside  Physical Exam: Vitals:   07/25/23 0404 07/25/23 0500 07/25/23 0732 07/25/23 1214  BP: (!) 155/75  (!) 153/74 (!) 151/85  Pulse: 66  82 85  Resp: 20  19 19   Temp: 98.7 F (37.1 C)  97.9 F (36.6 C) 97.8 F (36.6 C)  TempSrc: Oral  Oral Oral  SpO2: 95%  95% 98%  Weight:  95 kg    Height:       General.  Frail elderly male, in no acute distress. Pulmonary.  Few basal crackles bilaterally, normal respiratory effort. CV.  Regular rate and rhythm, no JVD, rub or murmur. Abdomen.  Soft, nontender, nondistended, BS positive. CNS.  Alert and oriented .  No focal neurologic deficit. Extremities.  No edema, no cyanosis, pulses intact and symmetrical. Psychiatry.  Judgment and insight appears normal.   Data Reviewed: Prior data reviewed.  Family Communication: Discussed with  2 daughters at bedside  Disposition: Status is: Observation The patient will require care spanning > 2 midnights and should be moved to inpatient because: Need more IV diuresis  Planned Discharge Destination: Home  DVT prophylaxis.  Eliquis  Time spent: 50 minutes  This record has been created using Conservation officer, historic buildings. Errors  have been sought and corrected,but may not always be located. Such creation errors do not reflect on the standard of care.   Author: Luna Salinas, MD 07/25/2023 3:06 PM  For on call review [email or web address].

## 2023-07-25 NOTE — Assessment & Plan Note (Signed)
 Mild AKI on admission which seems improving with diuresis. Creatinine improved to 1.67, baseline around 1.5 - Monitor renal function while patient is being diuresed -Avoid nephrotoxins

## 2023-07-25 NOTE — TOC Initial Note (Signed)
 Transition of Care [facility]) - Initial/Assessment Note    Patient Details  Name: Todd Valentine MRN: [id number] Date of Birth: 12-07-1940  [facility]) CM/SW Contact:    Alexandra Ice, RN Phone Number: 07/25/2023, 3:47 PM  Clinical Narrative:                  Patient lives alone, independent with ADLs. He has PCP in the community. No TOC needs identified, will continue to monitor       Patient Goals and CMS Choice            Expected Discharge Plan and Services                                              Prior Living Arrangements/Services                       Activities of Daily Living      Permission Sought/Granted                  Emotional Assessment              Admission diagnosis:  Orthopnea [R06.01] Bradycardia [R00.1] CHF (congestive heart failure) ([name]) [I50.9] DOE (dyspnea on exertion) [R06.09] Acute on chronic congestive heart failure, unspecified heart failure type [facility]) [I50.9] Patient Active Problem List   Diagnosis Date Noted   Chronic kidney disease, stage III (moderate) ([name]) 07/25/2023   BPH (benign prostatic hyperplasia)    CHF (congestive heart failure) ([name]) 07/24/2023   Mesenteric artery stenosis ([name]) 11/11/2021   Renal artery stenosis ([name])    Anxiety 11/09/2021   Obesity (BMI 30-39.9) 11/08/2021   Bradycardia    Hypertensive urgency 11/07/2021   Atrial flutter with controlled response ([name]) 11/07/2021   Diabetes mellitus with complication ([name]) 11/07/2021   Permanent atrial fibrillation ([name])    Essential hypertension    Atrial fibrillation with RVR ([name]) 05/08/2016   Hypomagnesemia 05/08/2016   Chronic atrial fibrillation ([name]) 01/02/2014   Acute on chronic diastolic CHF (congestive heart failure) ([name]) 01/02/2014   CAD (coronary artery disease), native coronary artery 01/02/2014   Bilateral leg edema 01/02/2014   Diabetes mellitus type 2 with complications ([name]) 01/02/2014    History of smoking 30 or more pack years 01/02/2014   Hyperlipidemia 01/02/2014   PCP:  [facility]:   [facility] [address], [place] - [address] [place] [address] Phone: [phone] Fax: [phone]  [id number], [place] - [address] AT [facility] [address] [place] [address] Phone: [phone] Fax: [phone]     Social Drivers of Health (SDOH) Social History: SDOH Screenings   Food Insecurity: No Food Insecurity (07/25/2023)  Housing: Low Risk  (07/25/2023)  Transportation Needs: No Transportation Needs (07/25/2023)  Utilities: Not At Risk (07/25/2023)  Financial Resource Strain: Low Risk  (02/05/2023)   Received from [facility]  Social Connections: Moderately Integrated (07/25/2023)  Tobacco Use: Medium Risk (07/24/2023)   SDOH Interventions:     Readmission Risk Interventions     No data to display

## 2023-07-25 NOTE — Assessment & Plan Note (Signed)
 Echocardiogram done in February 2025 with normal EF, indeterminate diastolic function.  Elevated BNP at 415 and pulmonary vascular congestion on imaging. Seems improving as he is now able to lay down. Renal functions with some improvement in creatinine. - Continue with IV Lasix  40 mg twice daily-likely will discharge on torsemide -Strict intake and output -Daily weight and BMP

## 2023-07-25 NOTE — Hospital Course (Addendum)
  Todd Valentine is a 83 y.o. male with medical history significant of HTN, chronic HFpEF, IIDM, chronic A-fib on Eliquis , CAD, CKD stage IIIa, BPH, cigarette smoking, presented with worsening of shortness of breath for 3 days. Slowly worsening and not developed orthopnea.  No leg edema or chest pain.  EMS found elevated blood pressure with systolic in 190s and bradycardia with heart rate in 38, on presentation to ED heart rate was 40 with blood pressure of 180/65, saturating well on room air.  Chest x-ray with pulmonary congestion.  Labs with mild AKI with creatinine at 1.8 baseline around 1.5, bicarb 22, troponin negative.  EKG with A-fib, bradycardia no ST changes.  Patient was started on IV diuresis.  Home Coreg  was held due to bradycardia.  5/31: Blood pressure mildly elevated at 153/74, UOP of 2500, creatinine with some improvement to 1.67, A1c of 7, TSH normal at 2.983.  Potassium borderline at 3.5 so replacing some potassium as he is getting IV Lasix . Likely will switch to torsemide on discharge.  Giving 1 more day of IV diuresis PT/OT with no follow-up recommendations.  6/1: Vitals stable, BMP with mild hypokalemia at 3.4 which is being repleted, slowly improving creatinine now at 1.60 which is at his baseline.  UOP of 5100 recorded with net negative of -6 L.  Patient was started on low-dose torsemide at home which he will start from tomorrow.  Clinically appears euvolemic.  Patient will continue holding Coreg  for now and his cardiologist can resume as appropriate.  Patient will continue the rest of his home medications and need to have a close follow-up with his providers for better control of hypertension and HFpEF.

## 2023-07-25 NOTE — Assessment & Plan Note (Signed)
 Patient presented with significantly elevated blood pressure and concern of hypertensive urgency. - Holding home Coreg  due to significant bradycardia -Continuing home hydralazine , Imdur , losartan  -Continue with IV Lasix 

## 2023-07-26 DIAGNOSIS — I5033 Acute on chronic diastolic (congestive) heart failure: Secondary | ICD-10-CM | POA: Diagnosis not present

## 2023-07-26 DIAGNOSIS — I509 Heart failure, unspecified: Secondary | ICD-10-CM | POA: Diagnosis not present

## 2023-07-26 DIAGNOSIS — R0601 Orthopnea: Secondary | ICD-10-CM

## 2023-07-26 DIAGNOSIS — R0609 Other forms of dyspnea: Secondary | ICD-10-CM | POA: Diagnosis not present

## 2023-07-26 DIAGNOSIS — R001 Bradycardia, unspecified: Secondary | ICD-10-CM | POA: Diagnosis not present

## 2023-07-26 LAB — BASIC METABOLIC PANEL WITH GFR
Anion gap: 11 (ref 5–15)
BUN: 28 mg/dL — ABNORMAL HIGH (ref 8–23)
CO2: 25 mmol/L (ref 22–32)
Calcium: 9 mg/dL (ref 8.9–10.3)
Chloride: 103 mmol/L (ref 98–111)
Creatinine, Ser: 1.6 mg/dL — ABNORMAL HIGH (ref 0.61–1.24)
GFR, Estimated: 43 mL/min — ABNORMAL LOW (ref >60)
Glucose, Bld: 105 mg/dL — ABNORMAL HIGH (ref 70–99)
Potassium: 3.4 mmol/L — ABNORMAL LOW (ref 3.5–5.1)
Sodium: 139 mmol/L (ref 135–145)

## 2023-07-26 LAB — GLUCOSE, CAPILLARY: Glucose-Capillary: 139 mg/dL — ABNORMAL HIGH (ref 70–99)

## 2023-07-26 MED ORDER — TORSEMIDE 20 MG PO TABS: 20.0000 mg | ORAL_TABLET | Freq: Every day | ORAL | 2 refills | Status: AC

## 2023-07-26 MED ORDER — POTASSIUM CHLORIDE CRYS ER 20 MEQ PO TBCR
40.0000 meq | EXTENDED_RELEASE_TABLET | Freq: Once | ORAL | Status: AC
  Administered 2023-07-26: 40 meq via ORAL
  Filled 2023-07-26: qty 2

## 2023-07-26 MED ORDER — SPIRONOLACTONE 25 MG PO TABS: 12.5000 mg | ORAL_TABLET | Freq: Every day | ORAL | 1 refills | Status: AC

## 2023-07-26 NOTE — Discharge Summary (Signed)
 Physician Discharge Summary   Patient: Todd Valentine MRN: [id number] DOB: 1940-10-26  Admit date:     07/24/2023  Discharge date: 07/26/23  Discharge Physician: Luna Salinas   PCP: [facility]   Recommendations at discharge:  Please obtain CBC and BMP and follow-up Follow-up with primary care provider Follow-up with cardiology  Discharge Diagnoses: Principal Problem:   Acute on chronic diastolic CHF (congestive heart failure) ([name]) Active Problems:   Bradycardia   Essential hypertension   Chronic kidney disease, stage III (moderate) ([name])   Diabetes mellitus with complication ([name])   BPH (benign prostatic hyperplasia)   DOE (dyspnea on exertion)   CHF (congestive heart failure) [facility])   Orthopnea   Hospital Course:  Todd Valentine is a 83 y.o. male with medical history significant of HTN, chronic HFpEF, IIDM, chronic A-fib on Eliquis , CAD, CKD stage IIIa, BPH, cigarette smoking, presented with worsening of shortness of breath for 3 days. Slowly worsening and not developed orthopnea.  No leg edema or chest pain.  EMS found elevated blood pressure with systolic in 190s and bradycardia with heart rate in 38, on presentation to ED heart rate was 40 with blood pressure of 180/65, saturating well on room air.  Chest x-ray with pulmonary congestion.  Labs with mild AKI with creatinine at 1.8 baseline around 1.5, bicarb 22, troponin negative.  EKG with A-fib, bradycardia no ST changes.  Patient was started on IV diuresis.  Home Coreg  was held due to bradycardia.  5/31: Blood pressure mildly elevated at 153/74, UOP of 2500, creatinine with some improvement to 1.67, A1c of 7, TSH normal at 2.983.  Potassium borderline at 3.5 so replacing some potassium as he is getting IV Lasix . Likely will switch to torsemide on discharge.  Giving 1 more day of IV diuresis PT/OT with no follow-up recommendations.  6/1: Vitals stable, BMP with mild hypokalemia at 3.4 which is being  repleted, slowly improving creatinine now at 1.60 which is at his baseline.  UOP of 5100 recorded with net negative of -6 L.  Patient was started on low-dose torsemide at home which he will start from tomorrow.  Clinically appears euvolemic.  Patient will continue the rest of his home medications and need to have a close follow-up with his providers for better control of hypertension and HFpEF.  Assessment and Plan: * Acute on chronic diastolic CHF (congestive heart failure) ([name]) Echocardiogram done in February 2025 with normal EF, indeterminate diastolic function.  Elevated BNP at 415 and pulmonary vascular congestion on imaging. Patient diuresed very well with net negative of more than 6 L.  Clinically appears euvolemic.  Might have flash pulmonary edema with elevated blood pressure. He has been started on low-dose torsemide and his cardiologist Kasdan to dose titration as appropriate.  Bradycardia Patient heart rate improved. - Keep holding Coreg -cardiology can restart as appropriate  Essential hypertension Patient presented with significantly elevated blood pressure and concern of hypertensive urgency.  Blood pressure has been improved. - Holding home Coreg  due to significant bradycardia -Continuing home hydralazine , Imdur , losartan   Chronic kidney disease, stage III (moderate) ([name]) Mild AKI on admission which seems improving with diuresis. Creatinine improved to 1.60, baseline around 1.5-1.6  Diabetes mellitus with complication ([name]) Postprandial increase in CBG with A1c of 7. - Continuing home glipizide , Farxiga  and Tradjenta  Will continue home medications on discharge  BPH (benign prostatic hyperplasia) - Continue with Flomax  and doxazosin     Consultants: None Procedures performed: None Disposition: Home Diet recommendation:  Discharge Diet Orders (From admission, onward)     Start     Ordered   07/26/23 0000  Diet - low sodium heart healthy        07/26/23 1041            Cardiac and Carb modified diet DISCHARGE MEDICATION: Allergies as of 07/26/2023       Reactions   Hydrocodone -acetaminophen  Other (See Comments)   Amlodipine  Swelling   Leg swelling on 10 mg   Aspirin Other (See Comments)   Contraindicated - Kidney disease Other reaction(s): Other (See Comments), Other (See Comments) Contraindicated - Kidney disease Contraindicated - Kidney disease   Ciprofloxacin Other (See Comments)   Other reaction(s): Pt states he could hardly move Other reaction(s): Other (See Comments), Other (See Comments) Pt states he could hardly move Other reaction(s): Other (See Comments) Pt states he could hardly move Other reaction(s): Pt states he could hardly move Pt states he could hardly move   Sitagliptin Other (See Comments)   Other reaction(s): Weakness Other reaction(s): Other (See Comments), Other (See Comments) Weakness Other reaction(s): Other (See Comments) Weakness Other reaction(s): Weakness Weakness        Medication List     STOP taking these medications    carvedilol  25 MG tablet Commonly known as: COREG    furosemide  20 MG tablet Commonly known as: LASIX        TAKE these medications    acetaminophen  500 MG tablet Commonly known as: TYLENOL  Take 1,000 mg by mouth every 6 (six) hours as needed for mild pain, fever or headache.   apixaban  2.5 MG Tabs tablet Commonly known as: Eliquis  Take 1 tablet (2.5 mg total) by mouth 2 (two) times daily.   doxazosin  8 MG tablet Commonly known as: CARDURA  Take 1 tablet (8 mg total) by mouth at bedtime.   ezetimibe  10 MG tablet Commonly known as: ZETIA  Take 1 tablet (10 mg total) by mouth daily.   famotidine  20 MG tablet Commonly known as: PEPCID  Take 1 tablet (20 mg total) by mouth at bedtime.   Farxiga  10 MG Tabs tablet Generic drug: dapagliflozin  propanediol Take 1 tablet (10 mg total) by mouth daily.   glipiZIDE  2.5 MG 24 hr tablet Commonly known as: GLUCOTROL   XL Take 2.5 mg by mouth daily.   hydrALAZINE  100 MG tablet Commonly known as: APRESOLINE  Take 1 tablet (100 mg total) by mouth every 8 (eight) hours.   isosorbide  mononitrate 60 MG 24 hr tablet Commonly known as: IMDUR  Take 60 mg one tablet in the am, one-half tablet (30 mg) in the pm and one-half tablet (30 mg) at bedtime.   linagliptin  5 MG Tabs tablet Commonly known as: TRADJENTA  Take 5 mg by mouth daily.   losartan  100 MG tablet Commonly known as: COZAAR  Take 1 tablet (100 mg total) by mouth daily.   magnesium  oxide 400 (241.3 Mg) MG tablet Commonly known as: MAG-OX Take 1 tablet by mouth daily.   nitroGLYCERIN  0.4 MG SL tablet Commonly known as: NITROSTAT  Place 1 tablet (0.4 mg total) under the tongue every 5 (five) minutes as needed for chest pain.   Potassium Chloride  ER 20 MEQ Tbcr Take 1 tablet by mouth once daily   Praluent  75 MG/ML Soaj Generic drug: Alirocumab  Inject 1 mL (75 mg total) into the skin every 14 (fourteen) days.   spironolactone  25 MG tablet Commonly known as: ALDACTONE  Take 0.5 tablets (12.5 mg total) by mouth daily.   tamsulosin  0.4 MG Caps capsule Commonly known as: FLOMAX   Take 0.4 mg by mouth daily.   torsemide 20 MG tablet Commonly known as: DEMADEX Take 1 tablet (20 mg total) by mouth daily.   traZODone  50 MG tablet Commonly known as: DESYREL  Take 50 mg by mouth at bedtime as needed for sleep.        Follow-up Information     [place], [facility]. Schedule an appointment as soon as possible for a visit in 1 week(s).   Contact information: [address] [place] [address] [phone]         Devorah Fonder, MD. Schedule an appointment as soon as possible for a visit.   Specialty: Cardiology Contact information: [address] [place] [address] [phone]                Discharge Exam: Cleavon Curls Weights   07/24/23 1013 07/25/23 0500 07/26/23 0500  Weight: 94.3 kg 95 kg 93.3 kg    General.  Well-developed elderly man, in no acute distress. Pulmonary.  Lungs clear bilaterally, normal respiratory effort. CV.  Regular rate and rhythm, no JVD, rub or murmur. Abdomen.  Soft, nontender, nondistended, BS positive. CNS.  Alert and oriented .  No focal neurologic deficit. Extremities.  No edema, no cyanosis, pulses intact and symmetrical. Psychiatry.  Judgment and insight appears normal.   Condition at discharge: stable  The results of significant diagnostics from this hospitalization (including imaging, microbiology, ancillary and laboratory) are listed below for reference.   Imaging Studies: DG Chest 1 View Result Date: 07/25/2023 CLINICAL DATA:  [id number].  CHF follow-up. EXAM: CHEST  1 VIEW COMPARISON:  Portable chest yesterday at 10:16 a.m. FINDINGS: 7:07 a.m. There is a low inspiration on exam today. There is bronchovascular crowding in the hypoinflated bases without focal consolidation. Minimal pleural effusions appear similar. The heart is enlarged and there is continued vascular prominence without visible edema. The mediastinum is normally outlined. There is calcification of the transverse aorta. Thoracic spondylosis. IMPRESSION: 1. Low inspiration on exam today. No other change in overall aeration. 2. Cardiomegaly with continued vascular prominence without visible edema. 3. Minimal pleural effusions appear similar. Electronically Signed   By: Denman Fischer M.D.   On: 07/25/2023 07:18   DG Chest 1 View Result Date: 07/24/2023 CLINICAL DATA:  Shortness of breath. EXAM: CHEST  1 VIEW COMPARISON:  11/07/2021 FINDINGS: The lungs are clear without focal pneumonia, edema, pneumothorax or pleural effusion. There is pulmonary vascular congestion without overt pulmonary edema. Cardiopericardial silhouette is at upper limits of normal for size. No acute bony abnormality. Telemetry leads overlie the chest. IMPRESSION: Pulmonary vascular congestion without overt pulmonary edema.  Electronically Signed   By: Donnal Fusi M.D.   On: 07/24/2023 11:11    Microbiology: Results for orders placed or performed during the hospital encounter of 04/09/20  SARS CORONAVIRUS 2 (TAT 6-24 HRS) Nasopharyngeal Nasopharyngeal Swab     Status: None   Collection Time: 04/09/20  8:51 AM   Specimen: Nasopharyngeal Swab  Result Value Ref Range Status   SARS Coronavirus 2 NEGATIVE NEGATIVE Final    Comment: (NOTE) SARS-CoV-2 target nucleic acids are NOT DETECTED.  The SARS-CoV-2 RNA is generally detectable in upper and lower respiratory specimens during the acute phase of infection. Negative results do not preclude SARS-CoV-2 infection, do not rule out co-infections with other pathogens, and should not be used as the sole basis for treatment or other patient management decisions. Negative results must be combined with clinical observations, patient history, and epidemiological  information. The expected result is Negative.  Fact Sheet for Patients: HairSlick.no  Fact Sheet for Healthcare Providers: quierodirigir.com  This test is not yet approved or cleared by the [place]  FDA and  has been authorized for detection and/or diagnosis of SARS-CoV-2 by FDA under an Emergency Use Authorization (EUA). This EUA will remain  in effect (meaning this test can be used) for the duration of the COVID-19 declaration under Se ction 564(b)(1) of the Act, 21 U.S.C. section 360bbb-3(b)(1), unless the authorization is terminated or revoked sooner.  Performed at [facility], [address], [place], [place] [address]     Labs: CBC: Recent Labs  Lab 07/24/23 1023  WBC 6.2  NEUTROABS 4.7  HGB 14.4  HCT 44.6  MCV 92.9  PLT 124*   Basic Metabolic Panel: Recent Labs  Lab 07/24/23 1023 07/25/23 0445 07/26/23 0450  NA 137 139 139  K 4.6 3.5 3.4*  CL 106 106 103  CO2 22 25 25   GLUCOSE 149* 104* 105*  BUN 30* 28* 28*   CREATININE 1.80* 1.67* 1.60*  CALCIUM  8.8* 8.7* 9.0  MG 2.2  --   --   PHOS 3.4  --   --    Liver Function Tests: Recent Labs  Lab 07/24/23 1023  AST 22  ALT 20  ALKPHOS 77  BILITOT 1.6*  PROT 6.6  ALBUMIN 3.6   CBG: Recent Labs  Lab 07/25/23 0730 07/25/23 1213 07/25/23 1735 07/25/23 2128 07/26/23 0838  GLUCAP 106* 228* 122* 140* 139*    Discharge time spent: greater than 30 minutes.  This record has been created using Conservation officer, historic buildings. Errors have been sought and corrected,but may not always be located. Such creation errors do not reflect on the standard of care.   Signed: Luna Salinas, MD [facility] 07/26/2023

## 2023-07-26 NOTE — Plan of Care (Signed)
  Problem: Education: Goal: Ability to describe self-care measures that may prevent or decrease complications (Diabetes Survival Skills Education) will improve Outcome: Progressing   Problem: Coping: Goal: Ability to adjust to condition or change in health will improve Outcome: Progressing   Problem: Fluid Volume: Goal: Ability to maintain a balanced intake and output will improve Outcome: Progressing   Problem: Health Behavior/Discharge Planning: Goal: Ability to identify and utilize available resources and services will improve Outcome: Progressing   Problem: Metabolic: Goal: Ability to maintain appropriate glucose levels will improve Outcome: Progressing   Problem: Nutritional: Goal: Maintenance of adequate nutrition will improve Outcome: Progressing   Problem: Clinical Measurements: Goal: Cardiovascular complication will be avoided Outcome: Progressing   Problem: Activity: Goal: Risk for activity intolerance will decrease Outcome: Progressing   Problem: Coping: Goal: Level of anxiety will decrease Outcome: Progressing

## 2023-08-03 NOTE — Progress Notes (Unsigned)
 Cardiology Clinic Note   Date: 08/03/2023 ID: Todd Valentine, DOB March 10, 1940, MRN [id number]  Primary Cardiologist:  Belva Boyden, MD  Chief Complaint   Todd Valentine is a 83 y.o. male who presents to the clinic today for ***  Patient Profile   Todd Valentine is followed by Dr. Gollan for the history outlined below.      Past medical history significant for: CAD. LHC 10/06/2013: Mild three-vessel CAD.  Occluded diagonal. Permanent A-fib. Onset August 2015. Chronic diastolic heart failure/aortic valve stenosis. Echo 04/09/2023: EF 60 to 65%.  No RWMA.  Moderate LVH.  Indeterminate diastolic parameters.  Normal RV size/function.  Moderate LAE.  Mild MR.  Mild to moderate aortic valve stenosis, mean gradient 15.8 mmHg.  Dilated IVC, RA pressure 8 mmHg. Renal artery stenosis. Renal duplex 11/09/2021: Limited diagnostic quality secondary to excessive bowel gas.  Measurements of peak systolic velocity 200 cm/second or greater are visualized in the proximal right renal artery and the mid left renal artery.  This raises the possibility of underlying renal artery stenosis. Renal angiography 11/13/2021: 30 to 40% mid right renal artery stenosis.  No significant stenosis left renal artery. PAD. Lower extremity arterial ultrasound 01/31/2022: Right: 50 to 74% stenosis anterior tibial artery.  Left: 30 to 49% stenosis deep femoral artery, 30 to 49% stenosis superficial femoral artery, total occlusion peroneal artery. ABI 04/09/2023: Right ABI within normal range.  Left ABI mildly decreased.  ABI/TBI increased on the right and unchanged on the left compared to prior study December 2023. Mesenteric artery stenosis. OSA. Hypertension. Hyperlipidemia. Lipid panel 07/30/2023: LDL 37, HDL 35, TG 79, total 88. T2DM. CKD stage III. Former tobacco abuse.  In summary, patient underwent hospital admission in August 2015 for increased shortness of breath associated with chronic diastolic heart failure  and A-fib with RVR.  Echo at that time showed EF 55 to 60%, mild LAE, mild to moderate MR/TR.  He underwent LHC which showed nonobstructive CAD.  Symptoms improved with diuresis.  He underwent hospital admission in March 2018 for generalized weakness and diarrhea/vomiting.  He was evaluated by urgent care and given IV fluids and Phenergan  with improvement.  He opted to return home to manage symptoms and push fluids.  He began vomiting the following morning and suffered a fall in the bathroom.  He was transported to the ED and found to be in A-fib with RVR.  Chest x-ray was consistent with aspiration pneumonia.  Rate control was pursued with aggressive correction of electrolyte abnormalities.  Echo demonstrated EF of 50 to 55%, mild MR, mild LAE, mildly reduced RV function.  In September 2023 patient underwent hospital admission for increased DOE, PND, orthopnea and heart rate in the 30s.  Clonidine , diltiazem , carvedilol  were held.  He was diuresed with IV Lasix  for pulmonary edema.  Echo showed EF 70 to 75%, mild LVH, elevated LVEDP, normal RV size/function, moderate RAE, moderate aortic valve stenosis with mean gradient 21 mmHg.  Patient's heart rate improved to the 50s and symptoms overall improved.  In September 2023 patient underwent renal duplex which was limited secondary to excessive bowel gas.  He was referred to vascular surgery and underwent renal angiography which showed mild right renal artery stenosis and no stenosis left renal artery as detailed above.  In November 2023 patient complained of leg pain and underwent lower extremity arterial ultrasound demonstrating stenosis bilateral lower extremities as detailed above.  His leg pain improved with holding statin and he was subsequently started on  PCSK9i.  Patient was last seen in the office by Dr. Gollan on 10/17/2022 for routine follow-up.  He was doing well at that time and no medication changes were made.  Patient contacted the office on 07/24/2023  to report heart rate 38 with associated shortness of breath.  He had contacted EMS and was transported to the ED.  EMS noted heart rate in the 40s.  Initial labs: WBC 6.2, hemoglobin 14.4, sodium 137, potassium 4.6, creatinine 1.8, BUN 30, AST 22, ALT 20, BNP 415, TSH 2.983.  Troponin negative x 2.  Chest x-ray demonstrated pulmonary vascular congestion without overt pulmonary edema.  EKG showed A-fib rate 38 bpm.  He was diuresed with IV Lasix  and admitted for further observation.  Carvedilol  held.     History of Present Illness    Today, patient ***  CAD LHC August 2015 showed mild three-vessel CAD with occluded diagonal.  Patient*** - Continue isosorbide , as needed SL NTG, Prilosec, Zetia .  Not on aspirin secondary to Eliquis .  Permanent A-fib/bradycardia Onset August 2015.  Recent hospital admission 07/24/2023 to 07/26/2023 for bradycardia with heart rate 38 bpm.  Denies spontaneous bleeding concerns.  Patient***EKG*** - Continue Eliquis . Appropriate Eliquis  dose. -Continue to hold carvedilol *** - 7-day ZIO*** -Refer to EP***  Chronic diastolic heart failure/aortic valve stenosis Echo February 2025 showed EF 60 to 65%, normal RV size/function, mild MR, mild to moderate aortic valve stenosis mean gradient 15.8 mmHg.  Patient*** Euvolemic and well compensated on exam. - Continue torsemide , spironolactone , losartan , isosorbide , hydralazine , Farxiga .  Hyperlipidemia LDL 37 June 2025, at goal. - Continue Praluent  and Zetia .  Hypertension BP today*** - Continue spironolactone , losartan , isosorbide , hydralazine , Cardura .  ROS: All other systems reviewed and are otherwise negative except as noted in History of Present Illness.  EKGs/Labs Reviewed        07/24/2023: ALT 20; AST 22 07/26/2023: BUN 28; Creatinine, Ser 1.60; Potassium 3.4; Sodium 139   07/24/2023: Hemoglobin 14.4; WBC 6.2   07/24/2023: TSH 2.983   07/24/2023: B Natriuretic Peptide 415.0  ***  Risk Assessment/Calculations     {Does this patient have ATRIAL FIBRILLATION?:[phone]} No BP recorded.  {Refresh Note OR Click here to enter BP  :1}***        Physical Exam    VS:  There were no vitals taken for this visit. , BMI There is no height or weight on file to calculate BMI.  GEN: Well nourished, well developed, in no acute distress. Neck: No JVD or carotid bruits. Cardiac: *** RRR. *** No murmur. No rubs or gallops.   Respiratory:  Respirations regular and unlabored. Clear to auscultation without rales, wheezing or rhonchi. GI: Soft, nontender, nondistended. Extremities: Radials/DP/PT 2+ and equal bilaterally. No clubbing or cyanosis. No edema ***  Skin: Warm and dry, no rash. Neuro: Strength intact.  Assessment & Plan   ***  Disposition: ***     {Are you ordering a CV Procedure (e.g. stress test, cath, DCCV, TEE, etc)?   Press F2        :[id number]}   Signed, Lonell Rives. Omero Kowal, DNP, NP-C

## 2023-08-05 ENCOUNTER — Ambulatory Visit: Attending: Student | Admitting: Student

## 2023-08-05 ENCOUNTER — Encounter: Payer: Self-pay | Admitting: Student

## 2023-08-05 VITALS — BP 128/64 | HR 83 | Ht 68.0 in | Wt 209.8 lb

## 2023-08-05 DIAGNOSIS — I350 Nonrheumatic aortic (valve) stenosis: Secondary | ICD-10-CM | POA: Insufficient documentation

## 2023-08-05 DIAGNOSIS — I701 Atherosclerosis of renal artery: Secondary | ICD-10-CM | POA: Insufficient documentation

## 2023-08-05 DIAGNOSIS — I5032 Chronic diastolic (congestive) heart failure: Secondary | ICD-10-CM | POA: Diagnosis present

## 2023-08-05 DIAGNOSIS — E785 Hyperlipidemia, unspecified: Secondary | ICD-10-CM | POA: Insufficient documentation

## 2023-08-05 DIAGNOSIS — R001 Bradycardia, unspecified: Secondary | ICD-10-CM | POA: Diagnosis not present

## 2023-08-05 DIAGNOSIS — I4821 Permanent atrial fibrillation: Secondary | ICD-10-CM | POA: Diagnosis present

## 2023-08-05 DIAGNOSIS — I2510 Atherosclerotic heart disease of native coronary artery without angina pectoris: Secondary | ICD-10-CM | POA: Insufficient documentation

## 2023-08-05 DIAGNOSIS — R42 Dizziness and giddiness: Secondary | ICD-10-CM | POA: Diagnosis present

## 2023-08-05 DIAGNOSIS — I10 Essential (primary) hypertension: Secondary | ICD-10-CM | POA: Insufficient documentation

## 2023-08-05 NOTE — Patient Instructions (Signed)
 Medication Instructions:  Your physician recommends that you continue on your current medications as directed. Please refer to the Current Medication list given to you today.   *If you need a refill on your cardiac medications before your next appointment, please call your pharmacy*  Lab Work: None ordered at this time  If you have labs (blood work) drawn today and your tests are completely normal, you will receive your results only by: MyChart Message (if you have MyChart) OR A paper copy in the mail If you have any lab test that is abnormal or we need to change your treatment, we will call you to review the results.  Testing/Procedures: None ordered at this time   Follow-Up:  Please keep your appointment with Dr. Gollan on 10/30/2023.  At [facility], you and your health needs are our priority.  As part of our continuing mission to provide you with exceptional heart care, our providers are all part of one team.  This team includes your primary Cardiologist (physician) and Advanced Practice Providers or APPs (Physician Assistants and Nurse Practitioners) who all work together to provide you with the care you need, when you need it.   We recommend signing up for the patient portal called MyChart.  Sign up information is provided on this After Visit Summary.  MyChart is used to connect with patients for Virtual Visits (Telemedicine).  Patients are able to view lab/test results, encounter notes, upcoming appointments, etc.  Non-urgent messages can be sent to your provider as well.   To learn more about what you can do with MyChart, go to [email or web address].

## 2023-10-23 ENCOUNTER — Other Ambulatory Visit: Payer: Self-pay | Admitting: Cardiovascular Disease

## 2023-10-23 DIAGNOSIS — E782 Mixed hyperlipidemia: Secondary | ICD-10-CM

## 2023-10-23 DIAGNOSIS — I2510 Atherosclerotic heart disease of native coronary artery without angina pectoris: Secondary | ICD-10-CM

## 2023-10-29 NOTE — Progress Notes (Unsigned)
 Cardiology Office Note  Date:  10/30/2023   ID:  FATE CASTER, DOB Dec 05, 1940, MRN [id number]  PCP:  [facility]   Chief Complaint  Patient presents with   Follow-up    Denies cardiac symptoms.    HPI:  Mr. Sako is a 83 year old gentleman with  smoking history for 30-40 years,  Prior alcohol use,  obesity,  chronic diastolic CHF,Echo March 2018 with ejection fraction 50-55% chronic atrial fibrillation,  hypertension type 2 diabetes,  Significant coronary disease by catheterization August 2015 three-vessel  sleep apnea, uses his CPAP periodically EF 60% in 2/25 presenting for follow-up of his chronic atrial fibrillation, diastolic CHF, hypertension, moderate aortic valve stenosis  Last seen by myself in clinic 8/ 2024 Seen by one of our providers 6/25  Echocardiogram 2/25 Normal left ventricular function  Moderate LVH noted  Normal right ventricular size and function  Mild to moderate aortic valve stenosis   In follow-up today reports doing well 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   Tolerating PCSK9 inhibitor Cholesterol at goal  Current medication regimen as below A.m. Hydralazine  100 every Q8 Isosorbide  60 in the morning Losartan  100 daily torsemide  20 mg in AM Spironolactone  25 mg daily   3 pm:  hydralazine  100 mg (skips frequently)   Pm Doxazosin  8 mg Isosorbide  30 in the evening   Bedtime:  Hydralazine  100 mg  Isosorbide  30 mg before bed   Lab work reviewed Hemoglobin A1c  7.0 Normal CBC Creatinine 1.8  Total chol 88, LDL 37  EKG personally reviewed by myself on todays visit EKG Interpretation Date/Time:  Friday October 30 2023 08:11:14 EDT Ventricular Rate:  87 PR Interval:    QRS Duration:  136 QT Interval:  400 QTC Calculation: 481 R Axis:   86  Text Interpretation: Atrial fibrillation Right bundle branch block When compared with ECG of 05-Aug-2023 15:39, No significant change was found Confirmed by Perla Lye [phone]) on 10/30/2023 8:24:54 AM   December 19, 2021 for leg swelling while at the beach Was taking extra Lasix  Of note amlodipine  started on recent hospitalization Swimmy head,  better by spreading out meds, Am and PM claudication sx on long ambulation, relieved by resting 1 to 2 minutes, improved by holding statin  Other past medical history reviewed Hospitalized September 2023 for bradycardia, shortness of breath Clonidine  diltiazem  carvedilol  held initially though lower dose carvedilol  was reinstated close to discharge given runs of tachycardia Coreg  12.5 twice daily reinitiated  Currently reports taking 17 medications in the morning, has some lightheadedness Thinks he is overmedicated in the morning Previously was taking several of his blood pressure medications in the evening and had no symptoms  Continues to have leg weakness, thinks he might go back to the gym Reports having some pain in his legs with ambulation Concerned about claudication symptoms Concerned as he has worsening leg edema  Lives alone with supportive family after wife unexpectedly passed away In the past was 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 Declined ventilator if needed acute kidney injury/ATN,  requiring CRRT Diltiazem  dose increased at d/c up to 240 daily D/c on  pradaxa  (was going to change to eliquis ) Off lisinopril /HCTZ, metformin  Labs at d/c  CR 1.64, BUN 33  total cholesterol well controlled, 130 or less    hospital admission 10/02/2013 with discharge August 12. He presented with shortness of breath, Atrial fibrillation with RVR. Echocardiogram showed normal ejection fraction. Symptoms improved with diuresis.   Echocardiogram 10/02/2013 showing ejection fraction 55-60%, mildly dilated left atrium, mild to moderate MR and TR   He had cardiac catheterization 10/06/2013 showing occluded diagonal vessel, 40% proximal LAD disease, bifurcating marginal branch/ramus. Ramus had 60% followed by 80% disease, OM with 50% mid disease at least. RCA with 40% mid disease. Medical management recommended  PMH:   has a past medical history of Acute renal failure ([name]), BPH (benign prostatic hyperplasia), Chronic atrial fibrillation ([name]), Chronic diastolic CHF (congestive heart failure) ([name]), Chronic insomnia, Coronary artery disease, non-occlusive (10/06/2013), Depression, Diabetes mellitus with complication ([name]), Diverticulitis of colon, Dysrhythmia, Heart murmur, History of diverticulitis, Hypercholesterolemia, Hyperkalemia, Hyperlipidemia, Hyperlipidemia, Hypertension, Hypokalemia, Hypomagnesemia, Malignant melanoma ([name]), Melanoma ([name]), Mitral regurgitation, and Sleep apnea in adult.  PSH:    Past Surgical History:  Procedure Laterality Date   APPENDECTOMY     CARDIAC CATHETERIZATION  10/06/2013   COLONOSCOPY     COLONOSCOPY WITH PROPOFOL  N/A 11/23/2014   Procedure: COLONOSCOPY WITH PROPOFOL ;  Surgeon: Lamar ONEIDA Holmes, MD;  Location: [facility] ENDOSCOPY;  Service: Endoscopy;  Laterality: N/A;   COLONOSCOPY WITH PROPOFOL  N/A 04/11/2020   Procedure: COLONOSCOPY WITH PROPOFOL ;  Surgeon: Toledo, Ladell POUR, MD;  Location: [facility] ENDOSCOPY;  Service: Gastroenterology;  Laterality: N/A;  DM  ELIQUIS    MOHS SURGERY     removal tubular adenoma     RENAL ANGIOGRAPHY  Right 11/13/2021   Procedure: RENAL ANGIOGRAPHY;  Surgeon: Marea Selinda RAMAN, MD;  Location: [facility] INVASIVE CV LAB;  Service: Cardiovascular;  Laterality: Right;    Current Outpatient Medications on File Prior to Visit  Medication Sig Dispense Refill   acetaminophen  (TYLENOL ) 500 MG tablet Take 1,000 mg by mouth every 6 (six) hours as needed for mild pain, fever or headache.      Alirocumab  (PRALUENT ) 75 MG/ML SOAJ INJECT (75MG ) INTO THE SKIN EVERY 14 DAYS 6 mL 1   apixaban  (ELIQUIS ) 2.5 MG TABS tablet Take 1 tablet (2.5 mg total) by mouth 2 (two) times daily. 60 tablet 5   doxazosin  (CARDURA ) 8 MG tablet Take 1 tablet (8 mg total) by mouth at bedtime. 90 tablet 3   ezetimibe  (ZETIA ) 10 MG tablet Take 1 tablet (10 mg total) by mouth daily. 90 tablet 3   famotidine  (PEPCID ) 20 MG tablet Take 1 tablet (20 mg total) by mouth at bedtime.     FARXIGA  10 MG TABS tablet Take 1 tablet (10 mg total) by mouth daily. 90 tablet 3   glipiZIDE  (GLUCOTROL  XL) 2.5 MG 24 hr tablet Take 2.5 mg by mouth daily.     hydrALAZINE  (APRESOLINE ) 100 MG tablet Take 1 tablet (100 mg total) by mouth every 8 (eight) hours. 270 tablet 3   isosorbide  mononitrate (IMDUR ) 60 MG 24 hr tablet Take 60 mg one tablet in the am, one-half tablet (30 mg) in the pm and one-half tablet (30 mg) at bedtime. 180 tablet 1   linagliptin  (TRADJENTA ) 5 MG TABS tablet Take 5 mg by mouth  daily.     losartan  (COZAAR ) 100 MG tablet Take 1 tablet (100 mg total) by mouth daily. 90 tablet 3   magnesium  oxide (MAG-OX) 400 (241.3 Mg) MG tablet Take 1 tablet by mouth daily.     nitroGLYCERIN  (NITROSTAT ) 0.4 MG SL tablet Place 1 tablet (0.4 mg total) under the tongue every 5 (five) minutes as needed for chest pain. 25 tablet 3   Potassium Chloride  ER 20 MEQ TBCR Take 1 tablet by mouth once daily 90 tablet 3   spironolactone  (ALDACTONE ) 25 MG tablet Take 0.5 tablets (12.5 mg total) by mouth daily. (Patient taking differently: Take 25 mg by mouth daily.) 30  tablet 1   tamsulosin  (FLOMAX ) 0.4 MG CAPS capsule Take 0.4 mg by mouth daily.      torsemide  (DEMADEX ) 20 MG tablet Take 1 tablet (20 mg total) by mouth daily. 30 tablet 2   traZODone  (DESYREL ) 50 MG tablet Take 50 mg by mouth at bedtime as needed for sleep.     No current facility-administered medications on file prior to visit.    Allergies:   Hydrocodone -acetaminophen , Amlodipine , Aspirin, Ciprofloxacin, Sitagliptin, and Statins   Social History:  The patient  reports that he quit smoking about 36 years ago. His smoking use included cigarettes. He started smoking about 66 years ago. He has a 90 pack-year smoking history. He has never used smokeless tobacco. He reports current alcohol use. He reports that he does not currently use drugs.   Family History:   Family history is unknown by patient.    Review of Systems: Review of Systems  Constitutional: Negative.   HENT: Negative.    Respiratory: Negative.    Cardiovascular: Negative.   Gastrointestinal: Negative.   Musculoskeletal: Negative.        Gait instability  Neurological: Negative.   Psychiatric/Behavioral: Negative.    All other systems reviewed and are negative.   PHYSICAL EXAM: VS:  BP 122/60   Pulse 87   Ht 5' 8.5 (1.74 m)   Wt 212 lb (96.2 kg)   SpO2 99%   BMI 31.77 kg/m  , BMI Body mass index is 31.77 kg/m. Constitutional:  oriented to person, place, and time. No distress.  HENT:  Head: Grossly normal Eyes:  no discharge. No scleral icterus.  Neck: No JVD, no carotid bruits  Cardiovascular: Regular rate and rhythm, no murmurs appreciated Pulmonary/Chest: Clear to auscultation bilaterally, no wheezes or rails Abdominal: Soft.  no distension.  no tenderness.  Musculoskeletal: Normal range of motion Neurological:  normal muscle tone. Coordination normal. No atrophy Skin: Skin warm and dry Psychiatric: normal affect, pleasant  Recent Labs: 07/24/2023: ALT 20; B Natriuretic Peptide 415.0; Hemoglobin 14.4;  Magnesium  2.2; Platelets 124; TSH 2.983 07/26/2023: BUN 28; Creatinine, Ser 1.60; Potassium 3.4; Sodium 139    Lipid Panel Lab Results  Component Value Date   CHOL 144 03/20/2022   HDL 39 (L) 03/20/2022   LDLCALC 94 03/20/2022   TRIG 54 03/20/2022    Wt Readings from Last 3 Encounters:  10/30/23 212 lb (96.2 kg)  08/05/23 209 lb 12.8 oz (95.2 kg)  07/26/23 205 lb 11 oz (93.3 kg)     ASSESSMENT AND PLAN:  Permanent atrial fibrillation ([name]) - Tachybradycardia syndrome hospitalized for symptomatic bradycardia September 2023 Back in the hospital for bradycardia May 2025 Coreg  held Tolerating Eliquis  2.5 twice daily, given age over 41 renal dysfunction  Essential hypertension Having rare orthostasis symptoms during the daytime on current regimen as detailed above Recommend he  move losartan  to the evening  chronic diastolic CHF (congestive heart failure) ([name]) - Continue torsemide  20 daily Renal function elevated but stable creatinine 1.8  Mixed hyperlipidemia  Statin myalgia, lipids well-controlled  Tolerating PCSK9 inhibitor  Type 2 diabetes mellitus with complication, without long-term current use of insulin  ([name]) - A1c stable, 7.0 Likes to drink orange juice  Acute on chronic renal failure Creatinine mildly elevated over the past 2 years 1.8 up from 1.5 On torsemide  20 daily  Leg pain Better by holding amloidpine and crestor   CAD with stable angina Currently with no symptoms of angina. No further workup at this time. Continue current medication regimen.    Orders Placed This Encounter  Procedures   EKG 12-Lead      Signed, Velinda Lunger, M.D., Ph.D. 10/30/2023  [facility] [place], [place] [phone]

## 2023-10-30 ENCOUNTER — Encounter: Payer: Self-pay | Admitting: Cardiovascular Disease

## 2023-10-30 ENCOUNTER — Ambulatory Visit: Attending: Cardiovascular Disease | Admitting: Cardiovascular Disease

## 2023-10-30 VITALS — BP 122/60 | HR 87 | Ht 68.5 in | Wt 212.0 lb

## 2023-10-30 DIAGNOSIS — I701 Atherosclerosis of renal artery: Secondary | ICD-10-CM | POA: Diagnosis present

## 2023-10-30 DIAGNOSIS — I4821 Permanent atrial fibrillation: Secondary | ICD-10-CM | POA: Insufficient documentation

## 2023-10-30 DIAGNOSIS — I10 Essential (primary) hypertension: Secondary | ICD-10-CM | POA: Diagnosis present

## 2023-10-30 DIAGNOSIS — E785 Hyperlipidemia, unspecified: Secondary | ICD-10-CM | POA: Insufficient documentation

## 2023-10-30 DIAGNOSIS — R001 Bradycardia, unspecified: Secondary | ICD-10-CM | POA: Insufficient documentation

## 2023-10-30 DIAGNOSIS — I5032 Chronic diastolic (congestive) heart failure: Secondary | ICD-10-CM | POA: Diagnosis not present

## 2023-10-30 DIAGNOSIS — R42 Dizziness and giddiness: Secondary | ICD-10-CM | POA: Insufficient documentation

## 2023-10-30 DIAGNOSIS — I2510 Atherosclerotic heart disease of native coronary artery without angina pectoris: Secondary | ICD-10-CM | POA: Insufficient documentation

## 2023-10-30 DIAGNOSIS — I350 Nonrheumatic aortic (valve) stenosis: Secondary | ICD-10-CM | POA: Diagnosis present

## 2023-10-30 DIAGNOSIS — E782 Mixed hyperlipidemia: Secondary | ICD-10-CM | POA: Insufficient documentation

## 2023-10-30 DIAGNOSIS — I25118 Atherosclerotic heart disease of native coronary artery with other forms of angina pectoris: Secondary | ICD-10-CM | POA: Diagnosis present

## 2023-10-30 MED ORDER — NITROGLYCERIN 0.4 MG SL SUBL: 0.4000 mg | SUBLINGUAL_TABLET | SUBLINGUAL | 3 refills | Status: AC | PRN

## 2023-10-30 MED ORDER — FARXIGA 10 MG PO TABS: 10.0000 mg | ORAL_TABLET | Freq: Every day | ORAL | 3 refills | Status: AC

## 2023-10-30 MED ORDER — DOXAZOSIN MESYLATE 8 MG PO TABS: 8.0000 mg | ORAL_TABLET | Freq: Every day | ORAL | 3 refills | Status: AC

## 2023-10-30 MED ORDER — ISOSORBIDE MONONITRATE ER 60 MG PO TB24: ORAL_TABLET | ORAL | 3 refills | Status: AC

## 2023-10-30 MED ORDER — HYDRALAZINE HCL 100 MG PO TABS: 100.0000 mg | ORAL_TABLET | Freq: Three times a day (TID) | ORAL | 3 refills | Status: AC

## 2023-10-30 MED ORDER — EZETIMIBE 10 MG PO TABS: 10.0000 mg | ORAL_TABLET | Freq: Every day | ORAL | 3 refills | Status: AC

## 2023-10-30 MED ORDER — LOSARTAN POTASSIUM 100 MG PO TABS: 100.0000 mg | ORAL_TABLET | Freq: Every day | ORAL | 3 refills | Status: AC

## 2023-10-30 MED ORDER — POTASSIUM CHLORIDE ER 20 MEQ PO TBCR
1.0000 | EXTENDED_RELEASE_TABLET | Freq: Every day | ORAL | 3 refills | Status: AC
Start: 2023-10-30 — End: ?

## 2023-10-30 NOTE — Patient Instructions (Addendum)
 Medication Instructions:   Please move the losartan  to dinner time  If you need a refill on your cardiac medications before your next appointment, please call your pharmacy.   Lab work: No new labs needed  Testing/Procedures: No new testing needed  Follow-Up: At [facility], you and your health needs are our priority.  As part of our continuing mission to provide you with exceptional heart care, we have created designated Provider Care Teams.  These Care Teams include your primary Cardiologist (physician) and Advanced Practice Providers (APPs -  Physician Assistants and Nurse Practitioners) who all work together to provide you with the care you need, when you need it.  You will need a follow up appointment in 6 months  Providers on your designated Care Team:   Lonni Meager, NP Bernardino Bring, PA-C Cadence [place], [place]  COVID-19 Vaccine Information can be found at: [email or web address] For questions related to vaccine distribution or appointments, please email [email or web address] or call [phone].

## 2023-11-17 ENCOUNTER — Telehealth: Payer: Self-pay | Admitting: Cardiovascular Disease

## 2023-11-17 NOTE — Telephone Encounter (Signed)
*  STAT* If patient is at the pharmacy, call can be transferred to refill team.   1. Which medications need to be refilled? (please list name of each medication and dose if known)   losartan  (COZAAR ) 100 MG tablet    2. Which pharmacy/location (including street and city if local pharmacy) is medication to be sent to? [id number] - [place], [place] - [address]   3. Do they need a 30 day or 90 day supply? 90

## 2023-11-17 NOTE — Telephone Encounter (Signed)
 RX sent in on 10/30/23.

## 2023-12-08 ENCOUNTER — Telehealth: Payer: Self-pay | Admitting: Cardiovascular Disease

## 2023-12-08 NOTE — Telephone Encounter (Signed)
*  STAT* If patient is at the pharmacy, call can be transferred to refill team.   1. Which medications need to be refilled? (please list name of each medication and dose if known)   doxazosin  (CARDURA ) 8 MG tablet  hydrALAZINE  (APRESOLINE ) 100 MG tablet   2. Would you like to learn more about the convenience, safety, & potential cost savings by using the [facility]?   3. Are you open to using the [facility] (Type [facility]. ).  4. Which pharmacy/location (including street and city if local pharmacy) is medication to be sent to?  [id number] - [place], [place] - [address]   5. Do they need a 30 day or 90 day supply? 90 day   Daughter Lessie) stated patient still has medication left.  Daughter noted patient will be going out of town.

## 2023-12-08 NOTE — Telephone Encounter (Signed)
 Sent in RX refills for a year on 10/30/23

## 2024-01-11 ENCOUNTER — Other Ambulatory Visit: Payer: Self-pay | Admitting: Cardiovascular Disease

## 2024-01-11 DIAGNOSIS — I482 Chronic atrial fibrillation, unspecified: Secondary | ICD-10-CM

## 2024-01-12 NOTE — Telephone Encounter (Signed)
 Prescription refill request for Eliquis  received. Indication:afib Last office visit:9/25 Scr:2.01  10/25 Age: 83 Weight:96.2  kg  Prescription refilled

## 2024-03-03 ENCOUNTER — Ambulatory Visit: Attending: Cardiovascular Disease

## 2024-03-03 DIAGNOSIS — I739 Peripheral vascular disease, unspecified: Secondary | ICD-10-CM | POA: Diagnosis not present

## 2024-03-04 LAB — VAS US ABI WITH/WO TBI
Left ABI: 0.6
Right ABI: 0.77

## 2024-03-05 ENCOUNTER — Ambulatory Visit: Payer: Self-pay | Admitting: Cardiovascular Disease

## 2024-03-05 DIAGNOSIS — I739 Peripheral vascular disease, unspecified: Secondary | ICD-10-CM

## 2024-03-10 ENCOUNTER — Ambulatory Visit: Attending: Cardiovascular Disease | Admitting: Cardiovascular Disease

## 2024-03-10 ENCOUNTER — Encounter: Payer: Self-pay | Admitting: Cardiovascular Disease

## 2024-03-10 VITALS — BP 120/60 | HR 89 | Ht 68.5 in | Wt 217.1 lb

## 2024-03-10 DIAGNOSIS — I739 Peripheral vascular disease, unspecified: Secondary | ICD-10-CM | POA: Insufficient documentation

## 2024-03-10 DIAGNOSIS — E785 Hyperlipidemia, unspecified: Secondary | ICD-10-CM | POA: Diagnosis not present

## 2024-03-10 DIAGNOSIS — I4821 Permanent atrial fibrillation: Secondary | ICD-10-CM | POA: Diagnosis not present

## 2024-03-10 DIAGNOSIS — I2510 Atherosclerotic heart disease of native coronary artery without angina pectoris: Secondary | ICD-10-CM | POA: Diagnosis not present

## 2024-03-10 DIAGNOSIS — I10 Essential (primary) hypertension: Secondary | ICD-10-CM | POA: Insufficient documentation

## 2024-03-10 DIAGNOSIS — I5032 Chronic diastolic (congestive) heart failure: Secondary | ICD-10-CM | POA: Insufficient documentation

## 2024-03-10 NOTE — Progress Notes (Signed)
 "    Cardiology Office Note   Date:  03/10/2024   ID:  Todd Valentine, DOB 1940-09-14, MRN [id number]  PCP:  [facility]  Cardiologist:  Dr. Perla  Chief Complaint  Patient presents with   New Patient (Initial Visit)    Claudication c/o calves tightening and tingling. Meds reviewed verbally with pt.      History of Present Illness: Todd Valentine is a 84 y.o. male who was referred by Dr. Gollan for evaluation and management of peripheral arterial disease. He has known history of chronic diastolic heart failure, chronic atrial fibrillation, essential hypertension, type 2 diabetes, coronary artery disease, aortic stenosis and previous tobacco use. He reports bilateral calf claudication after walking 1-2 blocks.  No rest pain or lower extremity ulceration. He underwent noninvasive vascular testing recently which showed an ABI of 0.77 on the right and 0.6 on the left.  He has underlying chronic kidney disease with most recent creatinine of 2.  He denies chest pain or worsening dyspnea.  Past Medical History:  Diagnosis Date   Acute renal failure    BPH (benign prostatic hyperplasia)    Chronic atrial fibrillation ([name])    Chronic diastolic CHF (congestive heart failure) ([name])    a. echo as above   Chronic insomnia    Coronary artery disease, non-occlusive 10/06/2013   a. cath 09/2013: pLAD 40, CTO Diag, ramus 40-->80, OM 50 mRCA 40, med rx   Depression    Diabetes mellitus with complication ([name])    Diverticulitis of colon    Dysrhythmia    atrial fibrillation   Heart murmur    systolic murmur, unspecified   History of diverticulitis    Hypercholesterolemia    Hyperkalemia    Hyperlipidemia    Hyperlipidemia    Hypertension    Hypokalemia    Hypomagnesemia    Malignant melanoma ([name])    Melanoma ([name])    Mitral regurgitation    a. echo 09/2013: EF 55-60%, mildly dilated left atrrium, mild to moderate MR/TR   Sleep apnea in adult     Past Surgical  History:  Procedure Laterality Date   APPENDECTOMY     CARDIAC CATHETERIZATION  10/06/2013   COLONOSCOPY     COLONOSCOPY WITH PROPOFOL  N/A 11/23/2014   Procedure: COLONOSCOPY WITH PROPOFOL ;  Surgeon: Lamar ONEIDA Holmes, MD;  Location[facility] ENDOSCOPY;  Service: Endoscopy;  Laterality: N/A;   COLONOSCOPY WITH PROPOFOL  N/A 04/11/2020   Procedure: COLONOSCOPY WITH PROPOFOL ;  Surgeon: Toledo, Ladell POUR, MD;  Location: [facility] ENDOSCOPY;  Service: Gastroenterology;  Laterality: N/A;  DM  ELIQUIS    MOHS SURGERY     removal tubular adenoma     RENAL ANGIOGRAPHY Right 11/13/2021   Procedure: RENAL ANGIOGRAPHY;  Surgeon: Marea Selinda RAMAN, MD;  Location: [facility] INVASIVE CV LAB;  Service: Cardiovascular;  Laterality: Right;     Current Outpatient Medications  Medication Sig Dispense Refill   acetaminophen  (TYLENOL ) 500 MG tablet Take 1,000 mg by mouth every 6 (six) hours as needed for mild pain, fever or headache.      Alirocumab  (PRALUENT ) 75 MG/ML SOAJ INJECT (75MG ) INTO THE SKIN EVERY 14 DAYS 6 mL 1   apixaban  (ELIQUIS ) 2.5 MG TABS tablet Take 1 tablet by mouth twice daily 60 tablet 5   doxazosin  (CARDURA ) 8 MG tablet Take 1 tablet (8 mg total) by mouth at bedtime. 90 tablet 3   ezetimibe  (ZETIA ) 10 MG tablet Take 1 tablet (10 mg total) by mouth daily.  90 tablet 3   famotidine  (PEPCID ) 20 MG tablet Take 1 tablet (20 mg total) by mouth at bedtime.     FARXIGA  10 MG TABS tablet Take 1 tablet (10 mg total) by mouth daily. 90 tablet 3   glipiZIDE  (GLUCOTROL  XL) 2.5 MG 24 hr tablet Take 2.5 mg by mouth daily.     hydrALAZINE  (APRESOLINE ) 100 MG tablet Take 1 tablet (100 mg total) by mouth every 8 (eight) hours. 270 tablet 3   isosorbide  mononitrate (IMDUR ) 60 MG 24 hr tablet Take 60 mg one tablet in the am, one-half tablet (30 mg) in the pm and one-half tablet (30 mg) at bedtime. 180 tablet 3   linagliptin  (TRADJENTA ) 5 MG TABS tablet Take 5 mg by mouth daily.     losartan  (COZAAR ) 100 MG tablet Take 1 tablet  (100 mg total) by mouth daily. 90 tablet 3   magnesium  oxide (MAG-OX) 400 (241.3 Mg) MG tablet Take 1 tablet by mouth daily.     nitroGLYCERIN  (NITROSTAT ) 0.4 MG SL tablet Place 1 tablet (0.4 mg total) under the tongue every 5 (five) minutes as needed for chest pain. 25 tablet 3   Potassium Chloride  ER 20 MEQ TBCR Take 1 tablet (20 mEq total) by mouth daily. 90 tablet 3   spironolactone  (ALDACTONE ) 25 MG tablet Take 0.5 tablets (12.5 mg total) by mouth daily. (Patient taking differently: Take 25 mg by mouth daily.) 30 tablet 1   tamsulosin  (FLOMAX ) 0.4 MG CAPS capsule Take 0.4 mg by mouth daily.      torsemide  (DEMADEX ) 20 MG tablet Take 1 tablet (20 mg total) by mouth daily. 30 tablet 2   traZODone  (DESYREL ) 50 MG tablet Take 50 mg by mouth at bedtime as needed for sleep.     No current facility-administered medications for this visit.    Allergies:   Hydrocodone -acetaminophen , Amlodipine , Aspirin, Ciprofloxacin, Rosuvastatin , Sitagliptin, and Statins    Social History:  The patient  reports that he quit smoking about 37 years ago. His smoking use included cigarettes. He started smoking about 67 years ago. He has a 90 pack-year smoking history. He has never used smokeless tobacco. He reports current alcohol use. He reports that he does not currently use drugs.   Family History:  The patient's Family history is unknown by patient.    ROS:  Please see the history of present illness.   Otherwise, review of systems are positive for none.   All other systems are reviewed and negative.    PHYSICAL EXAM: VS:  BP 120/60 (BP Location: Left Arm, Patient Position: Sitting, Cuff Size: Large)   Pulse 89   Ht 5' 8.5 (1.74 m)   Wt 217 lb 2 oz (98.5 kg)   SpO2 98%   BMI 32.53 kg/m  , BMI Body mass index is 32.53 kg/m. GEN: Well nourished, well developed, in no acute distress  HEENT: normal  Neck: no JVD, carotid bruits, or masses Cardiac: Irregularly irregular; no murmurs, rubs, or gallops,no  edema  Respiratory:  clear to auscultation bilaterally, normal work of breathing GI: soft, nontender, nondistended, + BS MS: no deformity or atrophy  Skin: warm and dry, no rash Neuro:  Strength and sensation are intact Psych: euthymic mood, full affect Vascular: Femoral pulses slightly diminished bilaterally.  Distal pulses are not palpable  EKG:  EKG is not ordered today.    Recent Labs: 07/24/2023: ALT 20; B Natriuretic Peptide 415.0; Hemoglobin 14.4; Magnesium  2.2; Platelets 124; TSH 2.983 07/26/2023: BUN 28; Creatinine, Ser  1.60; Potassium 3.4; Sodium 139    Lipid Panel    Component Value Date/Time   CHOL 144 03/20/2022 1117   CHOL 97 (L) 08/25/2017 0841   CHOL 169 10/03/2013 0359   TRIG 54 03/20/2022 1117   TRIG 187 10/03/2013 0359   HDL 39 (L) 03/20/2022 1117   HDL 30 (L) 08/25/2017 0841   HDL 28 (L) 10/03/2013 0359   CHOLHDL 3.7 03/20/2022 1117   VLDL 11 03/20/2022 1117   VLDL 37 10/03/2013 0359   LDLCALC 94 03/20/2022 1117   LDLCALC 41 08/25/2017 0841   LDLCALC 104 (H) 10/03/2013 0359      Wt Readings from Last 3 Encounters:  03/10/24 217 lb 2 oz (98.5 kg)  10/30/23 212 lb (96.2 kg)  08/05/23 209 lb 12.8 oz (95.2 kg)           No data to display            ASSESSMENT AND PLAN:  1.  Peripheral arterial disease with intermittent claudication: His symptoms are consistent with Rutherford class II.  I discussed with her the natural history and management of claudication and peripheral arterial disease.  He is an excellent medical therapy.  His symptoms do not seem to be lifestyle limiting at the present time and thus no indication for revascularization. His best option right now is a supervised exercise therapy program.  He was referred. The patient was educated on risk factors surrounding PAD. I discussed the importance of controlling risk factors and importance of regular exercise to help reduce pain. I discussed the effects of exercise on reducing  claudications and improving his risk factors including hyperlipidemia and hypertension.   2.  Permanent atrial fibrillation: Ventricular rate is controlled and he is on long-term anticoagulation with low-dose Eliquis .  3.  Essential hypertension: Blood pressure is well-controlled.  4.  Chronic diastolic heart failure: He appears to be euvolemic  5.  Coronary artery disease involving native coronary arteries without angina: Continue medical therapy  6.  Hyperlipidemia: Currently on ezetimibe  and Praluent . Most recent lipid profile is 45.   7.  Chronic kidney disease: Followed by nephrology with most recent creatinine of 2.    Disposition:   FU with me in 4 months  Signed,  Deatrice Cage, MD  03/10/2024 8:29 AM    [facility] "

## 2024-03-10 NOTE — Patient Instructions (Signed)
 Medication Instructions:  No changes *If you need a refill on your cardiac medications before your next appointment, please call your pharmacy*  Lab Work: None ordered If you have labs (blood work) drawn today and your tests are completely normal, you will receive your results only by: MyChart Message (if you have MyChart) OR A paper copy in the mail If you have any lab test that is abnormal or we need to change your treatment, we will call you to review the results.  Testing/Procedures: None ordered  Follow-Up: At [facility], you and your health needs are our priority.  As part of our continuing mission to provide you with exceptional heart care, our providers are all part of one team.  This team includes your primary Cardiologist (physician) and Advanced Practice Providers or APPs (Physician Assistants and Nurse Practitioners) who all work together to provide you with the care you need, when you need it.  Your next appointment:   4 month(s)  Provider:   Dr. Darron  We recommend signing up for the patient portal called MyChart.  Sign up information is provided on this After Visit Summary.  MyChart is used to connect with patients for Virtual Visits (Telemedicine).  Patients are able to view lab/test results, encounter notes, upcoming appointments, etc.  Non-urgent messages can be sent to your provider as well.   To learn more about what you can do with MyChart, go to [email or web address].   Other Instructions A referral has been made to the [facility]. They will call you to set this up.

## 2024-03-31 ENCOUNTER — Encounter: Payer: Self-pay | Admitting: Cardiovascular Disease

## 2024-07-15 ENCOUNTER — Ambulatory Visit: Admitting: Cardiovascular Disease
